# Patient Record
Sex: Male | Born: 1945
Health system: Southern US, Community
[De-identification: ages and names within clinical notes are randomized; demographics above are authoritative.]

## PROBLEM LIST (undated history)

## (undated) DIAGNOSIS — C439 Malignant melanoma of skin, unspecified: Secondary | ICD-10-CM

## (undated) DIAGNOSIS — M1990 Unspecified osteoarthritis, unspecified site: Secondary | ICD-10-CM

## (undated) DIAGNOSIS — T4145XA Adverse effect of unspecified anesthetic, initial encounter: Secondary | ICD-10-CM

## (undated) DIAGNOSIS — E785 Hyperlipidemia, unspecified: Secondary | ICD-10-CM

## (undated) DIAGNOSIS — H409 Unspecified glaucoma: Secondary | ICD-10-CM

## (undated) DIAGNOSIS — T8859XA Other complications of anesthesia, initial encounter: Secondary | ICD-10-CM

## (undated) DIAGNOSIS — I2510 Atherosclerotic heart disease of native coronary artery without angina pectoris: Secondary | ICD-10-CM

## (undated) DIAGNOSIS — C44311 Basal cell carcinoma of skin of nose: Secondary | ICD-10-CM

## (undated) DIAGNOSIS — Z9109 Other allergy status, other than to drugs and biological substances: Secondary | ICD-10-CM

## (undated) DIAGNOSIS — C61 Malignant neoplasm of prostate: Secondary | ICD-10-CM

## (undated) DIAGNOSIS — I255 Ischemic cardiomyopathy: Secondary | ICD-10-CM

## (undated) DIAGNOSIS — G4730 Sleep apnea, unspecified: Secondary | ICD-10-CM

## (undated) DIAGNOSIS — R0982 Postnasal drip: Secondary | ICD-10-CM

## (undated) DIAGNOSIS — C801 Malignant (primary) neoplasm, unspecified: Secondary | ICD-10-CM

## (undated) DIAGNOSIS — K219 Gastro-esophageal reflux disease without esophagitis: Secondary | ICD-10-CM

## (undated) DIAGNOSIS — E119 Type 2 diabetes mellitus without complications: Secondary | ICD-10-CM

## (undated) DIAGNOSIS — I10 Essential (primary) hypertension: Secondary | ICD-10-CM

## (undated) DIAGNOSIS — J349 Unspecified disorder of nose and nasal sinuses: Secondary | ICD-10-CM

## (undated) HISTORY — DX: Essential (primary) hypertension: I10

## (undated) HISTORY — DX: Ischemic cardiomyopathy: I25.5

## (undated) HISTORY — DX: Basal cell carcinoma of skin of nose: C44.311

## (undated) HISTORY — PX: KNEE SURGERY: SHX244

## (undated) HISTORY — DX: Malignant melanoma of skin, unspecified: C43.9

## (undated) HISTORY — DX: Gastro-esophageal reflux disease without esophagitis: K21.9

## (undated) HISTORY — PX: PROSTATE SURGERY: SHX751

## (undated) HISTORY — DX: Unspecified glaucoma: H40.9

## (undated) HISTORY — PX: HERNIA REPAIR: SHX51

## (undated) HISTORY — DX: Malignant neoplasm of prostate: C61

## (undated) HISTORY — DX: Type 2 diabetes mellitus without complications: E11.9

## (undated) HISTORY — DX: Unspecified disorder of nose and nasal sinuses: J34.9

## (undated) HISTORY — DX: Atherosclerotic heart disease of native coronary artery without angina pectoris: I25.10

---

## 1999-11-08 HISTORY — PX: JOINT REPLACEMENT: SHX530

## 2007-11-08 HISTORY — PX: PROSTATE SURGERY: SHX751

## 2008-01-01 ENCOUNTER — Encounter: Admission: RE | Admit: 2008-01-01 | Discharge: 2008-01-01 | Payer: Self-pay | Admitting: Orthopedic Surgery

## 2008-01-16 ENCOUNTER — Encounter: Admission: RE | Admit: 2008-01-16 | Discharge: 2008-01-16 | Payer: Self-pay | Admitting: Orthopedic Surgery

## 2008-04-30 ENCOUNTER — Ambulatory Visit (HOSPITAL_COMMUNITY): Admission: RE | Admit: 2008-04-30 | Discharge: 2008-04-30 | Payer: Self-pay | Admitting: Urology

## 2008-06-30 ENCOUNTER — Encounter (INDEPENDENT_AMBULATORY_CARE_PROVIDER_SITE_OTHER): Payer: Self-pay | Admitting: Urology

## 2008-06-30 ENCOUNTER — Inpatient Hospital Stay (HOSPITAL_COMMUNITY): Admission: RE | Admit: 2008-06-30 | Discharge: 2008-07-01 | Payer: Self-pay | Admitting: Urology

## 2008-08-28 ENCOUNTER — Ambulatory Visit (HOSPITAL_BASED_OUTPATIENT_CLINIC_OR_DEPARTMENT_OTHER): Admission: RE | Admit: 2008-08-28 | Discharge: 2008-08-28 | Payer: Self-pay | Admitting: Urology

## 2008-11-07 HISTORY — PX: HERNIA REPAIR: SHX51

## 2009-02-13 ENCOUNTER — Inpatient Hospital Stay (HOSPITAL_COMMUNITY): Admission: RE | Admit: 2009-02-13 | Discharge: 2009-02-17 | Payer: Self-pay | Admitting: Surgery

## 2009-10-22 ENCOUNTER — Ambulatory Visit: Admission: RE | Admit: 2009-10-22 | Discharge: 2009-10-22 | Payer: Self-pay | Admitting: Orthopedic Surgery

## 2009-11-03 ENCOUNTER — Inpatient Hospital Stay (HOSPITAL_COMMUNITY): Admission: RE | Admit: 2009-11-03 | Discharge: 2009-11-05 | Payer: Self-pay | Admitting: Orthopedic Surgery

## 2010-11-07 HISTORY — PX: JOINT REPLACEMENT: SHX530

## 2011-02-07 LAB — COMPREHENSIVE METABOLIC PANEL
Albumin: 4.2 g/dL (ref 3.5–5.2)
BUN: 18 mg/dL (ref 6–23)
CO2: 30 mEq/L (ref 19–32)
Chloride: 103 mEq/L (ref 96–112)
Creatinine, Ser: 1.16 mg/dL (ref 0.4–1.5)
GFR calc non Af Amer: >60 mL/min (ref >60)
Total Bilirubin: 1.1 mg/dL (ref 0.3–1.2)

## 2011-02-07 LAB — PROTIME-INR
INR: 1.05 (ref 0.00–1.49)
Prothrombin Time: 13.1 seconds (ref 11.6–15.2)
Prothrombin Time: 13.6 seconds (ref 11.6–15.2)

## 2011-02-07 LAB — URINALYSIS, ROUTINE W REFLEX MICROSCOPIC
Bilirubin Urine: NEGATIVE
Nitrite: NEGATIVE
Specific Gravity, Urine: 1.025 (ref 1.005–1.030)
Urobilinogen, UA: 1 mg/dL (ref 0.0–1.0)

## 2011-02-07 LAB — GLUCOSE, CAPILLARY
Glucose-Capillary: 132 mg/dL — ABNORMAL HIGH (ref 70–99)
Glucose-Capillary: 144 mg/dL — ABNORMAL HIGH (ref 70–99)
Glucose-Capillary: 146 mg/dL — ABNORMAL HIGH (ref 70–99)
Glucose-Capillary: 155 mg/dL — ABNORMAL HIGH (ref 70–99)
Glucose-Capillary: 188 mg/dL — ABNORMAL HIGH (ref 70–99)
Glucose-Capillary: 227 mg/dL — ABNORMAL HIGH (ref 70–99)
Glucose-Capillary: 71 mg/dL (ref 70–99)
Glucose-Capillary: 80 mg/dL (ref 70–99)

## 2011-02-07 LAB — DIFFERENTIAL
Basophils Absolute: 0 10*3/uL (ref 0.0–0.1)
Lymphocytes Relative: 23 % (ref 12–46)
Neutro Abs: 3.7 10*3/uL (ref 1.7–7.7)

## 2011-02-07 LAB — CBC
HCT: 46 % (ref 39.0–52.0)
MCHC: 33.1 g/dL (ref 30.0–36.0)
MCV: 91.5 fL (ref 78.0–100.0)
Platelets: 263 10*3/uL (ref 150–400)
WBC: 5.8 10*3/uL (ref 4.0–10.5)

## 2011-02-07 LAB — HEMOGLOBIN AND HEMATOCRIT, BLOOD
HCT: 42.6 % (ref 39.0–52.0)
Hemoglobin: 13.7 g/dL (ref 13.0–17.0)
Hemoglobin: 14.5 g/dL (ref 13.0–17.0)

## 2011-02-07 LAB — APTT: aPTT: 30 seconds (ref 24–37)

## 2011-02-07 LAB — TYPE AND SCREEN: ABO/RH(D): O POS

## 2011-02-07 LAB — URINE CULTURE: Special Requests: NEGATIVE

## 2011-02-16 LAB — GLUCOSE, CAPILLARY
Glucose-Capillary: 121 mg/dL — ABNORMAL HIGH (ref 70–99)
Glucose-Capillary: 145 mg/dL — ABNORMAL HIGH (ref 70–99)
Glucose-Capillary: 145 mg/dL — ABNORMAL HIGH (ref 70–99)
Glucose-Capillary: 149 mg/dL — ABNORMAL HIGH (ref 70–99)
Glucose-Capillary: 154 mg/dL — ABNORMAL HIGH (ref 70–99)
Glucose-Capillary: 155 mg/dL — ABNORMAL HIGH (ref 70–99)
Glucose-Capillary: 156 mg/dL — ABNORMAL HIGH (ref 70–99)
Glucose-Capillary: 160 mg/dL — ABNORMAL HIGH (ref 70–99)
Glucose-Capillary: 161 mg/dL — ABNORMAL HIGH (ref 70–99)
Glucose-Capillary: 168 mg/dL — ABNORMAL HIGH (ref 70–99)
Glucose-Capillary: 170 mg/dL — ABNORMAL HIGH (ref 70–99)
Glucose-Capillary: 176 mg/dL — ABNORMAL HIGH (ref 70–99)
Glucose-Capillary: 177 mg/dL — ABNORMAL HIGH (ref 70–99)
Glucose-Capillary: 199 mg/dL — ABNORMAL HIGH (ref 70–99)
Glucose-Capillary: 221 mg/dL — ABNORMAL HIGH (ref 70–99)

## 2011-02-17 LAB — DIFFERENTIAL
Basophils Absolute: 0 10*3/uL (ref 0.0–0.1)
Basophils Relative: 0 % (ref 0–1)
Lymphocytes Relative: 31 % (ref 12–46)
Monocytes Absolute: 0.4 10*3/uL (ref 0.1–1.0)
Monocytes Relative: 10 % (ref 3–12)
Neutro Abs: 2.1 10*3/uL (ref 1.7–7.7)
Neutrophils Relative %: 51 % (ref 43–77)

## 2011-02-17 LAB — COMPREHENSIVE METABOLIC PANEL
Albumin: 4 g/dL (ref 3.5–5.2)
Alkaline Phosphatase: 111 U/L (ref 39–117)
BUN: 14 mg/dL (ref 6–23)
Calcium: 9.5 mg/dL (ref 8.4–10.5)
Creatinine, Ser: 1.03 mg/dL (ref 0.4–1.5)
Glucose, Bld: 292 mg/dL — ABNORMAL HIGH (ref 70–99)
Total Protein: 6.8 g/dL (ref 6.0–8.3)

## 2011-02-17 LAB — CBC
HCT: 43.8 % (ref 39.0–52.0)
Hemoglobin: 14.7 g/dL (ref 13.0–17.0)
MCHC: 33.6 g/dL (ref 30.0–36.0)
MCV: 89.2 fL (ref 78.0–100.0)
Platelets: 267 10*3/uL (ref 150–400)
RDW: 14.6 % (ref 11.5–15.5)

## 2011-03-22 NOTE — Op Note (Signed)
NAME:  Justin Cohen, FAILS NO.:  [id number]   MEDICAL RECORD NO.:  [id number]          PATIENT TYPE:  AMB   LOCATION:  [facility]                         FACILITY:  [facility]   PHYSICIAN:  Heloise Purpura, MD      DATE OF BIRTH:  21-Sep-1946   DATE OF PROCEDURE:  08/28/2008  DATE OF DISCHARGE:                               OPERATIVE REPORT   PREOPERATIVE DIAGNOSIS:  Urethral foreign body.   POSTOPERATIVE DIAGNOSIS:  Urethral foreign body.   PROCEDURES:  1. Cystoscopy.  2. Dilation of urethral meatus.  3. Removal of staples from urethra.   SURGEON:  Heloise Purpura, MD.   ANESTHESIA:  General.   COMPLICATIONS:  None.   INDICATIONS FOR PROCEDURE:  [name] is a 65 year old gentleman who  has a history of prostate cancer and is status post treatment with a  robotic assisted laparoscopic radical prostatectomy.  He developed  voiding symptoms approximately 3-4 weeks ago including gross hematuria  and sprain of his urinary stream.  Flexible cystoscopy was performed in  the office and demonstrated a staple within the urethra at the  anastomosis that appeared to have migrated from the dorsal venous  complex.  It was recommended that he undergo removal of this staple.  There was also some question whether he may have an anastomotic  stricture that could require dilation.  He was therefore counseled  regarding the potential risks, complications and alternative options  associated with the above procedures and informed consent was obtained.   DESCRIPTION OF PROCEDURE:  The patient was taken to the operating room  and a general anesthetic was administered.  He was given preoperative  antibiotics, placed in the dorsal lithotomy position, and prepped and  draped in the usual sterile fashion.  Next, a preoperative time-out was  performed.  Cystourethroscopy was then performed with an attempt using  the 22-French cystoscope sheath.  However, the patient's urethral meatus  was noted to  be somewhat stenotic and required serial dilation with Sissy Hoff sounds.  Once the urethra was calibrated up 24-French, the 22-  French cystoscope sheath was able to be passed without difficulty.  The  urethra appeared normal up to the bladder neck where the patient's  healing anastomosis was identified.  Two small staples were noted in  approximately 11 o'clock.  The bladder was examined and appeared to be  normal without evidence for bladder tumors or other mucosal  abnormalities.  The ureteral orifices were in the normal position and  effluxing clear urine.  The flexible graspers were then used to remove  the urethral staples without difficulty.  Reinspection of the urethra  demonstrated no further staples or foreign body within the urethra.  The  patient's bladder was emptied and the procedure was ended.  He tolerated  this well and without complications.  He was able to be extubated and  transferred to the recovery unit in satisfactory condition.      Heloise Purpura, MD  Electronically Signed     LB/MEDQ  D:  08/28/2008  T:  08/28/2008  Job:  [id number]

## 2011-03-22 NOTE — Op Note (Signed)
NAME:  Justin Cohen, OHLIN NO.:  [id number]   MEDICAL RECORD NO.:  [id number]          PATIENT TYPE:  INP   LOCATION:  1533                         FACILITY:  [facility]   PHYSICIAN:  Sandria Bales. Ezzard Standing, M.D.  DATE OF BIRTH:  06/06/1946   DATE OF PROCEDURE:  02/13/2009  DATE OF DISCHARGE:                               OPERATIVE REPORT   PREOPERATIVE DIAGNOSIS:  Ventral incisional hernia, right abdomen.   POSTOPERATIVE DIAGNOSIS:  Right abdominal ventral incisional hernia,  approximately a 4 cm defect.  Incarcerated with small bowel.   PROCEDURE:  Laparoscopic ventral hernia repair with a 15 cm x 15 cm  Parietex mesh.   SURGEON:  Sandria Bales. Ezzard Standing, M.D.   FIRST ASSISTANT:  Nona Dell, PA student II.   ANESTHESIA:  General endotracheal with 20 mL of 1% Xylocaine.   COMPLICATIONS:  None.   INDICATIONS FOR PROCEDURE:  Justin Cohen is a 65 year old white male who  had a robotic prostatectomy by Dr. Heloise Purpura in August 2009.  He did  well from the surgery but developed a significant ileus postoperatively  with abdominal distention and then noticed a hernia in his right  abdominal wall.  I think this is a trocar site hernia.  I discussed with  him repairing this hernia.   The indications and potential complications were explained to the  patient.  The potential complications of hernia repair include, but are  not limited to, bleeding, infection, bowel injury and the possibility of  an open surgery.   DESCRIPTION OF PROCEDURE:  The patient is placed in a supine position  with his left arm tucked.  A Foley catheter was placed.  I did have to  dilate his urethra a little bit with a hemostat to get the foley to  pass.  PAS stockings were placed.  A time out was held to identify the  patient and the procedure.   I prepped the abdomen with Betadine solution and then surgically draped  the abdomen with an Ioban drape.  I accessed the abdominal cavity  through the left  upper quadrant with a 10 mm Ethicon Optiview trocar.  I  insufflated the abdomen.  He was noted to have some adhesions to his  midline with his attachment to his colon which I took down.  I placed  two additional 5 mm trocars  for abdominal access.  There was also a  band adhesion which I thought was a potential point of  obstruction.  I  divided the band with harmonic scalpel.  He does have a weakness of his  umbilicus.  Whether it is a true hernia or not, I am not sure, but it  was remote enough from his other defect, that I left it alone.   In his right abdomen, at the right mid-abdomen in the lateral wall, he  had a 4 cm defect with bowel incarcerated in the hernia.  I reduced the  small bowel.  I then closed the hernia defect with figure-of-eight #1  Novofil sutures.  Then I did an underly mesh of Parietex.  I used a 15  cm x 15 cm Parietex mesh.  I put eight retention sutures  in a clockwise  fashion of #0 Novofil around the edges, and then used 39 staples to  staple underneath the mesh and lay this down flat against the peritoneal  surface.   At the end of the procedure I then desufflated the abdomen.  I wanted to  make sure that no gaps around the edge of the mesh, and then re-  insufflated the abdomen.  The mesh lay flat.  It looked like the repair  covered the hernia well.  I then removed the trocar from the trocar  sites.  I then closed the trocar sites with #5-0 Vicryl suture and the  painted the wounds with Dermabond.   The patient tolerated the procedure well.  He had a Dermabond placed.  He was transferred to the recovery room in good condition.  Sponge and  needle count were correct at the end of the case.      Sandria Bales. Ezzard Standing, M.D.  Electronically Signed     DHN/MEDQ  D:  02/13/2009  T:  02/13/2009  Job:  [id number]   cc:   Tally Joe, M.D.  Fax: [phone]   Heloise Purpura, MD  Fax: [phone]

## 2011-03-22 NOTE — Op Note (Signed)
NAME:  NASIIR, MONTS NO.:  [id number]   MEDICAL RECORD NO.:  [id number]          PATIENT TYPE:  INP   LOCATION:  0003                         [facility]ACILITY:  [facility]   PHYSICIAN:  Heloise Purpura, MD      DATE OF BIRTH:  February 04, 1946   DATE OF PROCEDURE:  06/30/2008  DATE OF DISCHARGE:                               OPERATIVE REPORT   PREOPERATIVE DIAGNOSIS:  Clinically localized adenocarcinoma of prostate  (clinical stage T1C N0 M0).   POSTOPERATIVE DIAGNOSIS:  Clinically localized adenocarcinoma of  prostate (clinical stage T1C N0 M0).   PROCEDURES.:  1. Robotic assisted laparoscopic radical prostatectomy (non nerve      sparing).  2. Bilateral laparoscopic pelvic lymphadenectomy.   SURGEON:  Heloise Purpura, M.D.   ASSISTANTS:  Delman Kitten, M.D. and Delia Chimes, NP   ANESTHESIA:  General.   COMPLICATIONS:  None.   ESTIMATED BLOOD LOSS:  100 mL.   SPECIMENS:  1. Prostate and seminal vesicles.  2. Right pelvic lymph nodes.  3. Left pelvic lymph nodes.   DISPOSITION:  Specimens to pathology.   DRAINS:  1. 20-French Coude catheter.  2. #19 Blake pelvic drain.   INDICATION:  Mr. Prettyman is a 65 year old gentleman who was found to  have a high risk clinically localized adenocarcinoma of the prostate.  After undergoing a metastatic evaluation which was negative and  discussing management options for treatment, he elected to proceed with  surgical therapy and the above procedure.  Potential risks,  complications, alternative options were all discussed with the patient  and informed consent was obtained.   DESCRIPTION OF PROCEDURE:  The patient was taken to the operating room  and a general anesthetic was administered.  He was given preoperative  antibiotics, placed in the dorsal lithotomy position, and prepped and  draped in the usual sterile fashion.  Next a preoperative time-out was  performed.  A Foley catheter was inserted into the urethra and into  the  bladder.  A site was selected just to the left of the umbilicus for  placement of the camera port.  This was placed using a standard open  Hassan technique which allowed entry into the peritoneal cavity under  direct vision without difficulty.  A 12 mm port was then placed and the  pneumoperitoneum established.  The 0 degrees lens was used to inspect  the abdomen and there was no evidence of any intra-abdominal injuries or  other abnormalities.  The remaining ports were then placed with  bilateral 8 mm robotic ports placed 10 cm lateral to and just inferior  to the camera port site.  An additional 8 mm robotic port was placed in  the far left lateral abdominal wall.  A 5 mm port was placed between the  camera port and the right robotic port.  An additional 12 mm port was  placed in the far right lateral abdominal wall for laparoscopic  assistance.  All ports were placed under direct vision without  difficulty.  The surgical cart was then docked.  With the aid of the  cautery scissors, the bladder was reflected posteriorly  allowing entry  into the space of Retzius and identification of the endopelvic fascia  and prostate.  The endopelvic fascia was then incised from the apex back  to the base of prostate bilaterally and the underlying levator muscle  fibers were swept laterally off the prostate thereby isolating the  dorsal venous complex.  The dorsal venous complex was then stapled and  divided with a 45 mm flex ETS stapler.  The bladder neck was identified  with the aid of Foley catheter manipulation and was divided anteriorly  allowing entry into the bladder.  This exposed the Foley catheter and  the catheter balloon was deflated.  The catheter was then brought into  the operative field and used to retract the prostate anteriorly.  This  exposed the posterior bladder neck which was then incised and dissection  proceeded posteriorly between the bladder neck and prostate until the   vasa deferentia and seminal vesicles were identified.  The vasa  deferentia were isolated, divided and lifted anteriorly.  The seminal  vesicles were then dissected down to their tips with care to control  seminal vesicle arterial blood supply.  The space between Denonvillier's  fascia and the anterior rectum was then bluntly developed thereby  isolating the vascular pedicles of prostate.  The vascular pedicles of  the prostate were then ligated with Hem-o-lok clips in a wide non nerve  sparing fashion.  The pedicles were then divided with scissor dissection  and attention turned to the urethra which was sharply divided allowing  the prostate specimen to be disarticulated.  The pelvis was then  copiously irrigated and hemostasis was ensured.  There was no evidence  for a rectal injury.  Attention then turned to the right pelvic  sidewall.  The fibrofatty tissue between the external iliac vein,  confluence of the iliac vessels, hypogastric artery, and Cooper's  ligament was dissected free from the pelvic sidewall with care to  preserve the obturator nerve.  Hem-o-lok clips were used for hemostasis  and lymphostasis.  The specimen was passed off for permanent pathologic  analysis.  An identical procedure was then performed on the  contralateral side.  Attention then turned to the urethral anastomosis.  A double-armed 2-0 Vicryl suture was used to perform a slip-knot  reapproximation between the posterior bladder neck, Denonvillier's  fascia and the posterior urethra.  A double-armed 3-0 Monocryl suture  was then used perform a 360 degrees running tension-free anastomosis  between the bladder neck and urethra.  The 20-French Coude catheter was  inserted into the bladder and irrigated and there were no blood clots  within the bladder.  The anastomosis appeared to be watertight.  A #19  Blake drain was then brought through the left robotic port and  appropriately positioned in the pelvis.  It  was secured to skin with a  nylon suture.  The surgical cart was undocked and the remaining ports  were examined.  The right arm 8 mm robotic port was noted to have some  bleeding from the port site when the port was removed.  It was therefore  decided to control this with a figure-of-eight 0 Vicryl suture which was  placed with the aid of the Carter-Thomason needle.  The right lateral 12  mm port site was also closed with a 0 Vicryl suture in a similar  fashion.  All remaining ports were removed under direct vision and  without difficulty.  The prostate specimen was then removed intact  within the Endopouch  retrieval bag via the periumbilical port site.  This fascial opening was closed with a running 0 Vicryl suture.  All  port sites were injected with quarter percent Marcaine and  reapproximated at the skin level with staples.  Sterile dressings were  applied.  The patient appeared to tolerate the procedure well without  complications.  He was able to be extubated and transferred to recovery  unit in satisfactory condition.      Heloise Purpura, MD  Electronically Signed     LB/MEDQ  D:  06/30/2008  T:  06/30/2008  Job:  [id number]

## 2011-03-25 NOTE — Discharge Summary (Signed)
NAME:  Justin Cohen, Justin Cohen NO.:  [id number]   MEDICAL RECORD NO.:  [id number]          PATIENT TYPE:  INP   LOCATION:  1533                         FACILITY:  [facility]   PHYSICIAN:  Sandria Bales. Ezzard Standing, M.D.  DATE OF BIRTH:  1946/01/24   DATE OF ADMISSION:  02/13/2009  DATE OF DISCHARGE:  02/17/2009                               DISCHARGE SUMMARY   Date of admission - 13 February 2009  Date of discharge - ?   DISCHARGE DIAGNOSES:  1. Incarcerated right abdominal ventral incisional hernia.  2. Obesity.  3. Diabetes mellitus.  4. Hypertension.  5. Anxiety.  6. Hypercholesterolemia.  7. History of __________ .  8. History of prostate cancer status post prostatectomy.  9. Recent bronchitis treated with Biaxin, steroid Dose-Pak.   OPERATIONS PERFORMED:  The patient had a laparoscopic ventral hernia  repair by Justin Cohen on February 13, 2009.   HISTORY OF ILLNESS:  Justin Cohen is a 65 year old white male who is a  patient of Dr. Tally Cohen.  He underwent a robotic prostatectomy in  August 2009 by Justin Cohen.  He had problems post operatively with a  significant ileus and abdominal distension.  He did well from the  protate surgery, but noticed an increasing bulge in his right lower  quadrant.  On December 09, 2008 he underwent a CAT scan which showed  abdominal hernia in his right abdomen with a 3 cm abdominal wall defect  but a 10 cm sac.   He denied history of peptic ulcer disease, liver disease, colon disease  or pancreatic disease, and his only other abdominal surgery has been  this robotic-assisted prostatectomy.   REVIEW OF SYSTEMS:  Significant that:   1. He has history of hypertension.  2. He has a recent bronchitis treated at urgent care clinic with      antibiotics and a Dosepak.  3. He has done well from his prostatic surgery.  His PSAs have been      stable.  4. He had a total right knee in 2001 for which he has done well.  5. He is diabetic on oral  hypoglycemics.  6. He has history of anxiety.  7. Hypercholesterolemia.   HOSPITAL COURSE:  On the day of admission the patient was taken to the  operating room where he underwent a laparoscopic ventral hernia repair  with a piece of Parietex mesh.  The hernia was incarcerated with a loop  of small bowel stuck in the hernia.   Postoperatively he did well.  The biggest problem was getting his bowel  function returned, but by the third day he was slowly getting better,  increasing his diet.  His diabetes was stable with a blood glucose of  199.  His sinuses which bothered him earlier after surgery were getting  better.   By the first postop day he was taking p.o.'s.  His sinuses seemed  better.  I did check his hemoglobin A1c which was 8.0.  He passed a large amount of gas on April 13 and was ready for discharge.   DISCHARGE INSTRUCTIONS:  1.  A diabetic diet.  2. He could shower when he could got home.  3. He was do no lifting for 4 weeks.  4. He was given Vicodin for pain.  5. He was to resume his home medications as outlined on the medication      reconciliation sheet.  [I could not find the copy of the medication      reconcilliation sheet in the chart.]  6. He is also to wear an abdominal binder for at least 1 month after      surgery.   DISCHARGE CONDITION:  Good.      Sandria Bales. Ezzard Standing, M.D.  Electronically Signed     DHN/MEDQ  D:  03/16/2009  T:  03/16/2009  Job:  [id number]   cc:   Justin Cohen, M.D.  Fax: [phone]   Justin Purpura, MD  Fax: [phone]

## 2011-08-08 LAB — GLUCOSE, CAPILLARY: Glucose-Capillary: 118 — ABNORMAL HIGH

## 2011-08-08 LAB — POCT I-STAT 4, (NA,K, GLUC, HGB,HCT)
Glucose, Bld: 117 — ABNORMAL HIGH
HCT: 43
Potassium: 4.4

## 2012-11-07 HISTORY — PX: BUNIONECTOMY: SHX129

## 2013-05-30 ENCOUNTER — Ambulatory Visit
Admission: RE | Admit: 2013-05-30 | Discharge: 2013-05-30 | Disposition: A | Discharge status: Home / Self Care | Payer: Medicare Other | Source: Ambulatory Visit | Attending: Family Medicine | Admitting: Family Medicine

## 2013-05-30 ENCOUNTER — Other Ambulatory Visit: Payer: Self-pay | Admitting: Family Medicine

## 2013-05-30 DIAGNOSIS — M25512 Pain in left shoulder: Secondary | ICD-10-CM

## 2013-06-06 ENCOUNTER — Other Ambulatory Visit: Payer: Self-pay | Admitting: Gastroenterology

## 2013-08-12 ENCOUNTER — Ambulatory Visit: Payer: Medicare Other

## 2013-09-24 ENCOUNTER — Other Ambulatory Visit: Payer: Self-pay | Admitting: Family Medicine

## 2013-09-24 DIAGNOSIS — Z139 Encounter for screening, unspecified: Secondary | ICD-10-CM

## 2013-09-30 ENCOUNTER — Ambulatory Visit
Admission: RE | Admit: 2013-09-30 | Discharge: 2013-09-30 | Disposition: A | Discharge status: Home / Self Care | Payer: Medicare Other | Source: Ambulatory Visit | Attending: Family Medicine | Admitting: Family Medicine

## 2013-09-30 DIAGNOSIS — Z139 Encounter for screening, unspecified: Secondary | ICD-10-CM

## 2013-10-09 ENCOUNTER — Ambulatory Visit (INDEPENDENT_AMBULATORY_CARE_PROVIDER_SITE_OTHER): Payer: Medicare Other

## 2013-10-09 VITALS — BP 151/81 | HR 75 | Resp 18

## 2013-10-09 DIAGNOSIS — E1140 Type 2 diabetes mellitus with diabetic neuropathy, unspecified: Secondary | ICD-10-CM

## 2013-10-09 DIAGNOSIS — M2010 Hallux valgus (acquired), unspecified foot: Secondary | ICD-10-CM

## 2013-10-09 DIAGNOSIS — Q828 Other specified congenital malformations of skin: Secondary | ICD-10-CM

## 2013-10-09 DIAGNOSIS — E1149 Type 2 diabetes mellitus with other diabetic neurological complication: Secondary | ICD-10-CM

## 2013-10-09 DIAGNOSIS — M2040 Other hammer toe(s) (acquired), unspecified foot: Secondary | ICD-10-CM

## 2013-10-09 NOTE — Patient Instructions (Signed)
Diabetes and Foot Care Diabetes may cause you to have problems because of poor blood supply (circulation) to your feet and legs. This may cause the skin on your feet to become thinner, break easier, and heal more slowly. Your skin may become dry, and the skin may peel and crack. You may also have nerve damage in your legs and feet causing decreased feeling in them. You may not notice minor injuries to your feet that could lead to infections or more serious problems. Taking care of your feet is one of the most important things you can do for yourself.  HOME CARE INSTRUCTIONS  Wear shoes at all times, even in the house. Do not go barefoot. Bare feet are easily injured.  Check your feet daily for blisters, cuts, and redness. If you cannot see the bottom of your feet, use a mirror or ask someone for help.  Wash your feet with warm water (do not use hot water) and mild soap. Then pat your feet and the areas between your toes until they are completely dry. Do not soak your feet as this can dry your skin.  Apply a moisturizing lotion or petroleum jelly (that does not contain alcohol and is unscented) to the skin on your feet and to dry, brittle toenails. Do not apply lotion between your toes.  Trim your toenails straight across. Do not dig under them or around the cuticle. File the edges of your nails with an emery board or nail file.  Do not cut corns or calluses or try to remove them with medicine.  Wear clean socks or stockings every day. Make sure they are not too tight. Do not wear knee-high stockings since they may decrease blood flow to your legs.  Wear shoes that fit properly and have enough cushioning. To break in new shoes, wear them for just a few hours a day. This prevents you from injuring your feet. Always look in your shoes before you put them on to be sure there are no objects inside.  Do not cross your legs. This may decrease the blood flow to your feet.  If you find a minor scrape,  cut, or break in the skin on your feet, keep it and the skin around it clean and dry. These areas may be cleansed with mild soap and water. Do not cleanse the area with peroxide, alcohol, or iodine.  When you remove an adhesive bandage, be sure not to damage the skin around it.  If you have a wound, look at it several times a day to make sure it is healing.  Do not use heating pads or hot water bottles. They may burn your skin. If you have lost feeling in your feet or legs, you may not know it is happening until it is too late.  Make sure your health care provider performs a complete foot exam at least annually or more often if you have foot problems. Report any cuts, sores, or bruises to your health care provider immediately. SEEK MEDICAL CARE IF:   You have an injury that is not healing.  You have cuts or breaks in the skin.  You have an ingrown nail.  You notice redness on your legs or feet.  You feel burning or tingling in your legs or feet.  You have pain or cramps in your legs and feet.  Your legs or feet are numb.  Your feet always feel cold. SEEK IMMEDIATE MEDICAL CARE IF:   There is increasing redness,   swelling, or pain in or around a wound.  There is a red line that goes up your leg.  Pus is coming from a wound.  You develop a fever or as directed by your health care provider.  You notice a bad smell coming from an ulcer or wound. Document Released: 10/21/2000 Document Revised: 06/26/2013 Document Reviewed: 04/02/2013 [employer] Patient Information 2014 [employer].  

## 2013-10-09 NOTE — Progress Notes (Signed)
   Subjective:    Patient ID: Justin Cohen, male    DOB: April 13, 1946, 67 y.o.   MRN: [id number]  HPI here to get my shoes    Review of Systems  Constitutional: Negative.   HENT: Negative.   Eyes: Negative.   Respiratory: Negative.   Cardiovascular: Negative.   Gastrointestinal: Negative.   Endocrine: Negative.   Genitourinary: Negative.   Musculoskeletal: Positive for back pain.  Skin: Negative.   Allergic/Immunologic: Negative.   Neurological: Negative.   Hematological: Negative.   Psychiatric/Behavioral: Negative.        Objective:   Physical Exam Neurovascular status intact and unchanged pedal pulses palpable DP postal for PT one over 4 bilateral. 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 At this time dispensed 1 pair of extra-depth shoes and 3 pairs of dual density Plastizote inlays in lace fit and contour well full contact the patient's foot and arch. Oral and written instructions for shoe and orthotic use are dispensed with break in instructions. Patient is no active ulcerations or wounds of current time. His diabetes is being well managed continue followup suggested a 1-2 month followup for possible palliative care in shoe assessment.     Assessment & Plan:  Assessment diabetes with peripheral neuropathy. Digital deformities and bunion deformity with associated keratoses. Dispensed extra-depth shoes and custom insoles to accommodate deformities and allow for appropriate ambulation patient replacing a pair more shoes that have been successful in managing his diabetic foot issues. Recheck in one to 2 months for followup and foot check and shoe adjustments if needed.  Alvan Dame DPM

## 2013-10-22 ENCOUNTER — Ambulatory Visit: Payer: Medicare Other | Admitting: Dietician

## 2013-12-11 ENCOUNTER — Ambulatory Visit (INDEPENDENT_AMBULATORY_CARE_PROVIDER_SITE_OTHER): Payer: Medicare Other

## 2013-12-11 VITALS — BP 159/87 | HR 55 | Resp 18

## 2013-12-11 DIAGNOSIS — E1140 Type 2 diabetes mellitus with diabetic neuropathy, unspecified: Secondary | ICD-10-CM

## 2013-12-11 DIAGNOSIS — B351 Tinea unguium: Secondary | ICD-10-CM

## 2013-12-11 DIAGNOSIS — E1142 Type 2 diabetes mellitus with diabetic polyneuropathy: Secondary | ICD-10-CM

## 2013-12-11 DIAGNOSIS — M79609 Pain in unspecified limb: Secondary | ICD-10-CM

## 2013-12-11 DIAGNOSIS — E1149 Type 2 diabetes mellitus with other diabetic neurological complication: Secondary | ICD-10-CM

## 2013-12-11 DIAGNOSIS — Q828 Other specified congenital malformations of skin: Secondary | ICD-10-CM

## 2013-12-11 NOTE — Patient Instructions (Signed)
Diabetes and Foot Care Diabetes may cause you to have problems because of poor blood supply (circulation) to your feet and legs. This may cause the skin on your feet to become thinner, break easier, and heal more slowly. Your skin may become dry, and the skin may peel and crack. You may also have nerve damage in your legs and feet causing decreased feeling in them. You may not notice minor injuries to your feet that could lead to infections or more serious problems. Taking care of your feet is one of the most important things you can do for yourself.  HOME CARE INSTRUCTIONS  Wear shoes at all times, even in the house. Do not go barefoot. Bare feet are easily injured.  Check your feet daily for blisters, cuts, and redness. If you cannot see the bottom of your feet, use a mirror or ask someone for help.  Wash your feet with warm water (do not use hot water) and mild soap. Then pat your feet and the areas between your toes until they are completely dry. Do not soak your feet as this can dry your skin.  Apply a moisturizing lotion or petroleum jelly (that does not contain alcohol and is unscented) to the skin on your feet and to dry, brittle toenails. Do not apply lotion between your toes.  Trim your toenails straight across. Do not dig under them or around the cuticle. File the edges of your nails with an emery board or nail file.  Do not cut corns or calluses or try to remove them with medicine.  Wear clean socks or stockings every day. Make sure they are not too tight. Do not wear knee-high stockings since they may decrease blood flow to your legs.  Wear shoes that fit properly and have enough cushioning. To break in new shoes, wear them for just a few hours a day. This prevents you from injuring your feet. Always look in your shoes before you put them on to be sure there are no objects inside.  Do not cross your legs. This may decrease the blood flow to your feet.  If you find a minor scrape,  cut, or break in the skin on your feet, keep it and the skin around it clean and dry. These areas may be cleansed with mild soap and water. Do not cleanse the area with peroxide, alcohol, or iodine.  When you remove an adhesive bandage, be sure not to damage the skin around it.  If you have a wound, look at it several times a day to make sure it is healing.  Do not use heating pads or hot water bottles. They may burn your skin. If you have lost feeling in your feet or legs, you may not know it is happening until it is too late.  Make sure your health care provider performs a complete foot exam at least annually or more often if you have foot problems. Report any cuts, sores, or bruises to your health care provider immediately. SEEK MEDICAL CARE IF:   You have an injury that is not healing.  You have cuts or breaks in the skin.  You have an ingrown nail.  You notice redness on your legs or feet.  You feel burning or tingling in your legs or feet.  You have pain or cramps in your legs and feet.  Your legs or feet are numb.  Your feet always feel cold. SEEK IMMEDIATE MEDICAL CARE IF:   There is increasing redness,   swelling, or pain in or around a wound.  There is a red line that goes up your leg.  Pus is coming from a wound.  You develop a fever or as directed by your health care provider.  You notice a bad smell coming from an ulcer or wound. Document Released: 10/21/2000 Document Revised: 06/26/2013 Document Reviewed: 04/02/2013 [employer] Patient Information 2014 [employer].  

## 2013-12-11 NOTE — Progress Notes (Signed)
° °  Subjective:    Patient ID: Justin Cohen, male    DOB: January 05, 1946, 68 y.o.   MRN: [id number]  HPI trim my nails and calluses on both feet and I wander If my shoes are too wide due to I have to trim up my calluses every 2 weeks or so    Review of Systems no new changes or findings noted at this time.     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 Keratoses first MTP and IP joint left more so than right. There is also thick brittle crumbly criptotic incurvated nails 1 through 5 bilateral which are debrided at this time. No open wounds ulcerations patient does have some decreased epicritic sensation of distal tuft of the toes otherwise intact sensations noted. Normal muscle strengths and range of motion is noted.       Assessment & Plan:  Assessment diabetes with history peripheral neuropathy and mild complications patient is a keratoses first MTP area bilateral manage with his diabetic shoes and Plastizote insert insoles inlays. Also thick criptotic incurvated mycotic nails 1 through 5 bilateral debrided at this time return suggest 3 month followup for continued palliative care in the future. Next  Harriet Masson DPM

## 2014-03-12 ENCOUNTER — Ambulatory Visit: Payer: Medicare Other

## 2014-10-07 HISTORY — PX: OTHER SURGICAL HISTORY: SHX169

## 2014-12-03 DIAGNOSIS — Z08 Encounter for follow-up examination after completed treatment for malignant neoplasm: Secondary | ICD-10-CM | POA: Diagnosis not present

## 2014-12-03 DIAGNOSIS — Z85828 Personal history of other malignant neoplasm of skin: Secondary | ICD-10-CM | POA: Diagnosis not present

## 2014-12-03 DIAGNOSIS — L570 Actinic keratosis: Secondary | ICD-10-CM | POA: Diagnosis not present

## 2015-01-09 DIAGNOSIS — M1712 Unilateral primary osteoarthritis, left knee: Secondary | ICD-10-CM | POA: Diagnosis not present

## 2015-01-15 DIAGNOSIS — H5210 Myopia, unspecified eye: Secondary | ICD-10-CM | POA: Diagnosis not present

## 2015-01-15 DIAGNOSIS — H5203 Hypermetropia, bilateral: Secondary | ICD-10-CM | POA: Diagnosis not present

## 2015-01-21 DIAGNOSIS — M1712 Unilateral primary osteoarthritis, left knee: Secondary | ICD-10-CM | POA: Diagnosis not present

## 2015-01-26 DIAGNOSIS — E782 Mixed hyperlipidemia: Secondary | ICD-10-CM | POA: Diagnosis not present

## 2015-01-26 DIAGNOSIS — M179 Osteoarthritis of knee, unspecified: Secondary | ICD-10-CM | POA: Diagnosis not present

## 2015-01-26 DIAGNOSIS — I10 Essential (primary) hypertension: Secondary | ICD-10-CM | POA: Diagnosis not present

## 2015-01-26 DIAGNOSIS — F419 Anxiety disorder, unspecified: Secondary | ICD-10-CM | POA: Diagnosis not present

## 2015-01-26 DIAGNOSIS — J309 Allergic rhinitis, unspecified: Secondary | ICD-10-CM | POA: Diagnosis not present

## 2015-01-26 DIAGNOSIS — Z0000 Encounter for general adult medical examination without abnormal findings: Secondary | ICD-10-CM | POA: Diagnosis not present

## 2015-01-26 DIAGNOSIS — C61 Malignant neoplasm of prostate: Secondary | ICD-10-CM | POA: Diagnosis not present

## 2015-01-26 DIAGNOSIS — E119 Type 2 diabetes mellitus without complications: Secondary | ICD-10-CM | POA: Diagnosis not present

## 2015-02-09 ENCOUNTER — Encounter (HOSPITAL_COMMUNITY): Payer: Self-pay

## 2015-02-10 NOTE — Patient Instructions (Addendum)
Justin Cohen  02/10/2015   Your procedure is scheduled on: 02/17/2015    Report to [facility] Main  Entrance and follow signs to               [place] at      1000 AM.  Call this number if you have problems the morning of surgery [phone]   Remember: Eat a good healthy snack prior to bedtime.    Do not eat food or drink liquids :After Midnight.     Take these medicines the morning of surgery with A SIP OF WATER: zyrtec, cosopt eye drops, Flonase nasal spray, Prevacid, Effexor                                 You may not have any metal on your body including hair pins and              piercings  Do not wear jewelry, , lotions, powders or perfumes., deodorant.                         Men may shave face and neck.   Do not bring valuables to the hospital. [place].  Contacts, dentures or bridgework may not be worn into surgery.  Leave suitcase in the car. After surgery it may be brought to your room.        Special Instructions: coughing and deep breathing exercises, leg exercises               Please read over the following fact sheets you were given: _____________________________________________________________________             [facility] - Preparing for Surgery Before surgery, you can play an important role.  Because skin is not sterile, your skin needs to be as free of germs as possible.  You can reduce the number of germs on your skin by washing with CHG (chlorahexidine gluconate) soap before surgery.  CHG is an antiseptic cleaner which kills germs and bonds with the skin to continue killing germs even after washing. Please DO NOT use if you have an allergy to CHG or antibacterial soaps.  If your skin becomes reddened/irritated stop using the CHG and inform your nurse when you arrive at Short Stay. Do not shave (including legs and underarms) for at least 48 hours prior to the first CHG  shower.  You may shave your face/neck. Please follow these instructions carefully:  1.  Shower with CHG Soap the night before surgery and the  morning of Surgery.  2.  If you choose to wash your hair, wash your hair first as usual with your  normal  shampoo.  3.  After you shampoo, rinse your hair and body thoroughly to remove the  shampoo.                           4.  Use CHG as you would any other liquid soap.  You can apply chg directly  to the skin and wash                       Gently with a  scrungie or clean washcloth.  5.  Apply the CHG Soap to your body ONLY FROM THE NECK DOWN.   Do not use on face/ open                           Wound or open sores. Avoid contact with eyes, ears mouth and genitals (private parts).                       Wash face,  Genitals (private parts) with your normal soap.             6.  Wash thoroughly, paying special attention to the area where your surgery  will be performed.  7.  Thoroughly rinse your body with warm water from the neck down.  8.  DO NOT shower/wash with your normal soap after using and rinsing off  the CHG Soap.                9.  Pat yourself dry with a clean towel.            10.  Wear clean pajamas.            11.  Place clean sheets on your bed the night of your first shower and do not  sleep with pets. Day of Surgery : Do not apply any lotions/deodorants the morning of surgery.  Please wear clean clothes to the hospital/surgery center.  FAILURE TO FOLLOW THESE INSTRUCTIONS MAY RESULT IN THE CANCELLATION OF YOUR SURGERY PATIENT SIGNATURE_________________________________  NURSE SIGNATURE__________________________________  ________________________________________________________________________  WHAT IS A BLOOD TRANSFUSION? Blood Transfusion Information  A transfusion is the replacement of blood or some of its parts. Blood is made up of multiple cells which provide different functions.  Red blood cells carry oxygen and are used for  blood loss replacement.  White blood cells fight against infection.  Platelets control bleeding.  Plasma helps clot blood.  Other blood products are available for specialized needs, such as hemophilia or other clotting disorders. BEFORE THE TRANSFUSION  Who gives blood for transfusions?   Healthy volunteers who are fully evaluated to make sure their blood is safe. This is blood bank blood. Transfusion therapy is the safest it has ever been in the practice of medicine. Before blood is taken from a donor, a complete history is taken to make sure that person has no history of diseases nor engages in risky social behavior (examples are intravenous drug use or sexual activity with multiple partners). The donor's travel history is screened to minimize risk of transmitting infections, such as malaria. The donated blood is tested for signs of infectious diseases, such as HIV and hepatitis. The blood is then tested to be sure it is compatible with you in order to minimize the chance of a transfusion reaction. If you or a relative donates blood, this is often done in anticipation of surgery and is not appropriate for emergency situations. It takes many days to process the donated blood. RISKS AND COMPLICATIONS Although transfusion therapy is very safe and saves many lives, the main dangers of transfusion include:  1. Getting an infectious disease. 2. Developing a transfusion reaction. This is an allergic reaction to something in the blood you were given. Every precaution is taken to prevent this. The decision to have a blood transfusion has been considered carefully by your caregiver before blood is given. Blood is not given unless the benefits outweigh the risks. AFTER  THE TRANSFUSION  Right after receiving a blood transfusion, you will usually feel much better and more energetic. This is especially true if your red blood cells have gotten low (anemic). The transfusion raises the level of the red blood  cells which carry oxygen, and this usually causes an energy increase.  The nurse administering the transfusion will monitor you carefully for complications. HOME CARE INSTRUCTIONS  No special instructions are needed after a transfusion. You may find your energy is better. Speak with your caregiver about any limitations on activity for underlying diseases you may have. SEEK MEDICAL CARE IF:   Your condition is not improving after your transfusion.  You develop redness or irritation at the intravenous (IV) site. SEEK IMMEDIATE MEDICAL CARE IF:  Any of the following symptoms occur over the next 12 hours:  Shaking chills.  You have a temperature by mouth above 102 F (38.9 C), not controlled by medicine.  Chest, back, or muscle pain.  People around you feel you are not acting correctly or are confused.  Shortness of breath or difficulty breathing.  Dizziness and fainting.  You get a rash or develop hives.  You have a decrease in urine output.  Your urine turns a dark color or changes to pink, red, or brown. Any of the following symptoms occur over the next 10 days:  You have a temperature by mouth above 102 F (38.9 C), not controlled by medicine.  Shortness of breath.  Weakness after normal activity.  The white part of the eye turns yellow (jaundice).  You have a decrease in the amount of urine or are urinating less often.  Your urine turns a dark color or changes to pink, red, or brown. Document Released: 10/21/2000 Document Revised: 01/16/2012 Document Reviewed: 06/09/2008 ExitCare Patient Information 2014 [place].  _______________________________________________________________________  Incentive Spirometer  An incentive spirometer is a tool that can help keep your lungs clear and active. This tool measures how well you are filling your lungs with each breath. Taking long deep breaths may help reverse or decrease the chance of developing breathing  (pulmonary) problems (especially infection) following:  A long period of time when you are unable to move or be active. BEFORE THE PROCEDURE   If the spirometer includes an indicator to show your best effort, your nurse or respiratory therapist will set it to a desired goal.  If possible, sit up straight or lean slightly forward. Try not to slouch.  Hold the incentive spirometer in an upright position. INSTRUCTIONS FOR USE  3. Sit on the edge of your bed if possible, or sit up as far as you can in bed or on a chair. 4. Hold the incentive spirometer in an upright position. 5. Breathe out normally. 6. Place the mouthpiece in your mouth and seal your lips tightly around it. 7. Breathe in slowly and as deeply as possible, raising the piston or the ball toward the top of the column. 8. Hold your breath for 3-5 seconds or for as long as possible. Allow the piston or ball to fall to the bottom of the column. 9. Remove the mouthpiece from your mouth and breathe out normally. 10. Rest for a few seconds and repeat Steps 1 through 7 at least 10 times every 1-2 hours when you are awake. Take your time and take a few normal breaths between deep breaths. 11. The spirometer may include an indicator to show your best effort. Use the indicator as a goal to work toward during each  repetition. 12. After each set of 10 deep breaths, practice coughing to be sure your lungs are clear. If you have an incision (the cut made at the time of surgery), support your incision when coughing by placing a pillow or rolled up towels firmly against it. Once you are able to get out of bed, walk around indoors and cough well. You may stop using the incentive spirometer when instructed by your caregiver.  RISKS AND COMPLICATIONS  Take your time so you do not get dizzy or light-headed.  If you are in pain, you may need to take or ask for pain medication before doing incentive spirometry. It is harder to take a deep breath if you  are having pain. AFTER USE  Rest and breathe slowly and easily.  It can be helpful to keep track of a log of your progress. Your caregiver can provide you with a simple table to help with this. If you are using the spirometer at home, follow these instructions: [place] IF:   You are having difficultly using the spirometer.  You have trouble using the spirometer as often as instructed.  Your pain medication is not giving enough relief while using the spirometer.  You develop fever of 100.5 F (38.1 C) or higher. SEEK IMMEDIATE MEDICAL CARE IF:   You cough up bloody sputum that had not been present before.  You develop fever of 102 F (38.9 C) or greater.  You develop worsening pain at or near the incision site. MAKE SURE YOU:   Understand these instructions.  Will watch your condition.  Will get help right away if you are not doing well or get worse. Document Released: 03/06/2007 Document Revised: 01/16/2012 Document Reviewed: 05/07/2007 [facility] Patient Information 2014 [place], [place].   ________________________________________________________________________

## 2015-02-11 ENCOUNTER — Encounter (HOSPITAL_COMMUNITY): Payer: Self-pay

## 2015-02-11 ENCOUNTER — Encounter (HOSPITAL_COMMUNITY)
Admission: RE | Admit: 2015-02-11 | Discharge: 2015-02-11 | Disposition: A | Discharge status: Home / Self Care | Payer: Commercial Managed Care - HMO | Source: Ambulatory Visit | Attending: Orthopedic Surgery | Admitting: Orthopedic Surgery

## 2015-02-11 DIAGNOSIS — Z01818 Encounter for other preprocedural examination: Secondary | ICD-10-CM | POA: Diagnosis not present

## 2015-02-11 HISTORY — DX: Malignant (primary) neoplasm, unspecified: C80.1

## 2015-02-11 HISTORY — DX: Sleep apnea, unspecified: G47.30

## 2015-02-11 HISTORY — DX: Unspecified osteoarthritis, unspecified site: M19.90

## 2015-02-11 LAB — APTT: aPTT: 33 seconds (ref 24–37)

## 2015-02-11 LAB — URINALYSIS, ROUTINE W REFLEX MICROSCOPIC
Bilirubin Urine: NEGATIVE
Glucose, UA: NEGATIVE mg/dL
HGB URINE DIPSTICK: NEGATIVE
Ketones, ur: NEGATIVE mg/dL
Leukocytes, UA: NEGATIVE
Nitrite: POSITIVE — AB
PROTEIN: NEGATIVE mg/dL
Specific Gravity, Urine: 1.013 (ref 1.005–1.030)
Urobilinogen, UA: 1 mg/dL (ref 0.0–1.0)
pH: 7.5 (ref 5.0–8.0)

## 2015-02-11 LAB — URINE MICROSCOPIC-ADD ON

## 2015-02-11 LAB — BASIC METABOLIC PANEL
Anion gap: 8 (ref 5–15)
BUN: 15 mg/dL (ref 6–23)
CO2: 27 mmol/L (ref 19–32)
CREATININE: 0.87 mg/dL (ref 0.50–1.35)
Calcium: 9.6 mg/dL (ref 8.4–10.5)
Chloride: 104 mmol/L (ref 96–112)
GFR, EST NON AFRICAN AMERICAN: 85 mL/min — AB (ref >90)
Glucose, Bld: 76 mg/dL (ref 70–99)
Potassium: 4.4 mmol/L (ref 3.5–5.1)
Sodium: 139 mmol/L (ref 135–145)

## 2015-02-11 LAB — CBC
HCT: 46.2 % (ref 39.0–52.0)
Hemoglobin: 15.1 g/dL (ref 13.0–17.0)
MCH: 29.3 pg (ref 26.0–34.0)
MCHC: 32.7 g/dL (ref 30.0–36.0)
MCV: 89.7 fL (ref 78.0–100.0)
Platelets: 291 10*3/uL (ref 150–400)
RBC: 5.15 MIL/uL (ref 4.22–5.81)
RDW: 14.6 % (ref 11.5–15.5)
WBC: 5.1 10*3/uL (ref 4.0–10.5)

## 2015-02-11 LAB — PROTIME-INR
INR: 1.04 (ref 0.00–1.49)
Prothrombin Time: 13.7 seconds (ref 11.6–15.2)

## 2015-02-11 LAB — SURGICAL PCR SCREEN
MRSA, PCR: NEGATIVE
Staphylococcus aureus: NEGATIVE

## 2015-02-11 NOTE — Progress Notes (Signed)
Clearance Dr Moreen Fowler dated 01/26/15 on chart

## 2015-02-11 NOTE — Progress Notes (Signed)
U/A with micro results done 02/11/2015 faxed via EPIC to Dr Alvan Dame.

## 2015-02-12 MED ORDER — AMLODIPINE BESYLATE 5 MG PO TABS: 5.0000 mg | ORAL_TABLET | Freq: Every day | ORAL | Status: DC

## 2015-02-15 NOTE — H&P (Signed)
TOTAL KNEE ADMISSION H&P  Patient is being admitted for left total knee arthroplasty.  Subjective:  Chief Complaint:  Left knee primary OA / pain.  HPI: Justin Cohen, 69 y.o. male, has a history of pain and functional disability in the left knee due to arthritis and has failed non-surgical conservative treatments for greater than 12 weeks to includeNSAID's and/or analgesics, corticosteriod injections and activity modification.  Onset of symptoms was gradual, starting ~! years ago with gradually worsening course since that time. The patient noted prior procedures on the knee to include  arthroplasty on the right knee in 2001.  Patient currently rates pain in the left knee(s) at 8 out of 10 with activity. Patient has night pain, worsening of pain with activity and weight bearing, pain that interferes with activities of daily living, pain with passive range of motion, crepitus and joint swelling.  Patient has evidence of periarticular osteophytes and joint space narrowing by imaging studies.  There is no active infection.  Risks, benefits and expectations were discussed with the patient.  Risks including but not limited to the risk of anesthesia, blood clots, nerve damage, blood vessel damage, failure of the prosthesis, infection and up to and including death.  Patient understand the risks, benefits and expectations and wishes to proceed with surgery.   PCP: Gara Kroner, MD  D/C Plans:      Home with HHPT  Post-op Meds:       No Rx given   Tranexamic Acid:      To be given - IV  Decadron:      Is to be given  FYI:     ASA post-op  Norco post-op  Tendency toward opoid constipation  CPAP    Past Medical History  Diagnosis Date  . Diabetes mellitus without complication   . Glaucoma   . Hypertension   . Sinus problem   . Sleep apnea     cpap- setting at 10   . Arthritis   . Cancer     prostate cancer , basal cell skin cancer removed from nose 10/2014     Past Surgical History   Procedure Laterality Date  . Hernia repair    . Prostate surgery    . Knee surgery      X 2    No prescriptions prior to admission   No Known Allergies   History  Substance Use Topics  . Smoking status: Former Research scientist (life sciences)  . Smokeless tobacco: Never Used  . Alcohol Use: Yes     Comment: occasional glass of wine        Review of Systems  Constitutional: Negative.   HENT: Negative.   Eyes: Negative.   Respiratory: Negative.   Cardiovascular: Negative.   Gastrointestinal: Negative.   Genitourinary: Negative.   Musculoskeletal: Positive for joint pain.  Skin: Negative.   Neurological: Negative.   Endo/Heme/Allergies: Negative.   Psychiatric/Behavioral: Negative.     Objective:  Physical Exam  Constitutional: He is oriented to person, place, and time. He appears well-developed and well-nourished.  HENT:  Head: Normocephalic.  Eyes: Pupils are equal, round, and reactive to light.  Neck: Neck supple. No JVD present. No tracheal deviation present. No thyromegaly present.  Cardiovascular: Normal rate, regular rhythm, normal heart sounds and intact distal pulses.   Respiratory: Effort normal and breath sounds normal. No stridor. No respiratory distress. He has no wheezes.  GI: Soft. There is no tenderness. There is no guarding.  Musculoskeletal:  Left knee: He exhibits decreased range of motion, swelling and bony tenderness. He exhibits no ecchymosis, no deformity, no laceration and no erythema. Tenderness found.  Lymphadenopathy:    He has no cervical adenopathy.  Neurological: He is alert and oriented to person, place, and time.  Skin: Skin is warm and dry.  Psychiatric: He has a normal mood and affect.       Imaging Review Plain radiographs demonstrate severe degenerative joint disease of the left knee(s). The overall alignment is  neutral. The bone quality appears to be good for age and reported activity level.  Assessment/Plan:  End stage arthritis, left  knee   The patient history, physical examination, clinical judgment of the provider and imaging studies are consistent with end stage degenerative joint disease of the left knee(s) and total knee arthroplasty is deemed medically necessary. The treatment options including medical management, injection therapy arthroscopy and arthroplasty were discussed at length. The risks and benefits of total knee arthroplasty were presented and reviewed. The risks due to aseptic loosening, infection, stiffness, patella tracking problems, thromboembolic complications and other imponderables were discussed. The patient acknowledged the explanation, agreed to proceed with the plan and consent was signed. Patient is being admitted for inpatient treatment for surgery, pain control, PT, OT, prophylactic antibiotics, VTE prophylaxis, progressive ambulation and ADL's and discharge planning. The patient is planning to be discharged home with home health services.     [place]   PA-C  02/15/2015, 2:16 PM

## 2015-02-16 MED ORDER — DEXTROSE 5 % IV SOLN
3.0000 g | INTRAVENOUS | Status: AC
  Administered 2015-02-17: 3 g via INTRAVENOUS
  Filled 2015-02-16 (×2): qty 3000

## 2015-02-17 ENCOUNTER — Inpatient Hospital Stay (HOSPITAL_COMMUNITY)
Admission: RE | Admit: 2015-02-17 | Discharge: 2015-02-18 | DRG: 470 | Disposition: A | Discharge status: Home Health | Payer: Commercial Managed Care - HMO | Source: Ambulatory Visit | Attending: Orthopedic Surgery | Admitting: Orthopedic Surgery

## 2015-02-17 ENCOUNTER — Inpatient Hospital Stay (HOSPITAL_COMMUNITY): Payer: Commercial Managed Care - HMO | Admitting: Anesthesiology

## 2015-02-17 ENCOUNTER — Encounter (HOSPITAL_COMMUNITY)
Admission: RE | Disposition: A | Discharge status: Home Health | Payer: Self-pay | Source: Ambulatory Visit | Attending: Orthopedic Surgery

## 2015-02-17 ENCOUNTER — Encounter (HOSPITAL_COMMUNITY): Payer: Self-pay | Admitting: *Deleted

## 2015-02-17 DIAGNOSIS — Z96652 Presence of left artificial knee joint: Secondary | ICD-10-CM

## 2015-02-17 DIAGNOSIS — H409 Unspecified glaucoma: Secondary | ICD-10-CM | POA: Diagnosis present

## 2015-02-17 DIAGNOSIS — Z8546 Personal history of malignant neoplasm of prostate: Secondary | ICD-10-CM | POA: Diagnosis not present

## 2015-02-17 DIAGNOSIS — G4730 Sleep apnea, unspecified: Secondary | ICD-10-CM | POA: Diagnosis present

## 2015-02-17 DIAGNOSIS — Z6841 Body Mass Index (BMI) 40.0 and over, adult: Secondary | ICD-10-CM

## 2015-02-17 DIAGNOSIS — Z85828 Personal history of other malignant neoplasm of skin: Secondary | ICD-10-CM

## 2015-02-17 DIAGNOSIS — E119 Type 2 diabetes mellitus without complications: Secondary | ICD-10-CM | POA: Diagnosis not present

## 2015-02-17 DIAGNOSIS — Z87891 Personal history of nicotine dependence: Secondary | ICD-10-CM

## 2015-02-17 DIAGNOSIS — M1712 Unilateral primary osteoarthritis, left knee: Principal | ICD-10-CM | POA: Diagnosis present

## 2015-02-17 DIAGNOSIS — M179 Osteoarthritis of knee, unspecified: Secondary | ICD-10-CM | POA: Diagnosis not present

## 2015-02-17 DIAGNOSIS — Z96659 Presence of unspecified artificial knee joint: Secondary | ICD-10-CM

## 2015-02-17 DIAGNOSIS — M25562 Pain in left knee: Secondary | ICD-10-CM | POA: Diagnosis present

## 2015-02-17 DIAGNOSIS — I10 Essential (primary) hypertension: Secondary | ICD-10-CM | POA: Diagnosis present

## 2015-02-17 HISTORY — PX: JOINT REPLACEMENT: SHX530

## 2015-02-17 HISTORY — PX: TOTAL KNEE ARTHROPLASTY: SHX125

## 2015-02-17 LAB — GLUCOSE, CAPILLARY
GLUCOSE-CAPILLARY: 139 mg/dL — AB (ref 70–99)
Glucose-Capillary: 186 mg/dL — ABNORMAL HIGH (ref 70–99)
Glucose-Capillary: 312 mg/dL — ABNORMAL HIGH (ref 70–99)

## 2015-02-17 LAB — TYPE AND SCREEN
ABO/RH(D): O POS
ANTIBODY SCREEN: NEGATIVE

## 2015-02-17 SURGERY — ARTHROPLASTY, KNEE, TOTAL
Anesthesia: Spinal | Site: Knee | Laterality: Left

## 2015-02-17 MED ORDER — BUPIVACAINE IN DEXTROSE 0.75-8.25 % IT SOLN
INTRATHECAL | Status: DC | PRN
  Administered 2015-02-17: 2 mL via INTRATHECAL

## 2015-02-17 MED ORDER — MIDAZOLAM HCL 2 MG/2ML IJ SOLN
INTRAMUSCULAR | Status: AC
Start: 2015-02-17 — End: 2015-02-17
  Filled 2015-02-17: qty 2

## 2015-02-17 MED ORDER — FLUTICASONE PROPIONATE 50 MCG/ACT NA SUSP
2.0000 | Freq: Every morning | NASAL | Status: DC
  Administered 2015-02-18: 2 via NASAL
  Filled 2015-02-17: qty 16

## 2015-02-17 MED ORDER — HYDROMORPHONE HCL 1 MG/ML IJ SOLN
0.5000 mg | INTRAMUSCULAR | Status: DC | PRN
  Administered 2015-02-17: 1 mg via INTRAVENOUS
  Filled 2015-02-17 (×2): qty 1

## 2015-02-17 MED ORDER — SODIUM CHLORIDE 0.9 % IJ SOLN
INTRAMUSCULAR | Status: AC
  Filled 2015-02-17: qty 50

## 2015-02-17 MED ORDER — ASPIRIN EC 325 MG PO TBEC
325.0000 mg | DELAYED_RELEASE_TABLET | Freq: Two times a day (BID) | ORAL | Status: DC
  Administered 2015-02-18: 325 mg via ORAL
  Filled 2015-02-17 (×3): qty 1

## 2015-02-17 MED ORDER — VENLAFAXINE HCL ER 75 MG PO CP24
75.0000 mg | ORAL_CAPSULE | Freq: Every day | ORAL | Status: DC
  Administered 2015-02-18: 75 mg via ORAL
  Filled 2015-02-17 (×2): qty 1

## 2015-02-17 MED ORDER — HYDROMORPHONE HCL 1 MG/ML IJ SOLN: 0.2500 mg | INTRAMUSCULAR | Status: DC | PRN

## 2015-02-17 MED ORDER — PHENYLEPHRINE HCL 10 MG/ML IJ SOLN
10.0000 mg | INTRAVENOUS | Status: DC | PRN
  Administered 2015-02-17: 40 ug/min via INTRAVENOUS

## 2015-02-17 MED ORDER — METOCLOPRAMIDE HCL 5 MG/ML IJ SOLN: 5.0000 mg | Freq: Three times a day (TID) | INTRAMUSCULAR | Status: DC | PRN

## 2015-02-17 MED ORDER — MAGNESIUM CITRATE PO SOLN: 1.0000 | Freq: Once | ORAL | Status: AC | PRN

## 2015-02-17 MED ORDER — DOCUSATE SODIUM 100 MG PO CAPS
100.0000 mg | ORAL_CAPSULE | Freq: Two times a day (BID) | ORAL | Status: DC
  Administered 2015-02-17 – 2015-02-18 (×2): 100 mg via ORAL

## 2015-02-17 MED ORDER — PHENOL 1.4 % MT LIQD
1.0000 | OROMUCOSAL | Status: DC | PRN
  Filled 2015-02-17: qty 177

## 2015-02-17 MED ORDER — ROSUVASTATIN CALCIUM 5 MG PO TABS
5.0000 mg | ORAL_TABLET | Freq: Every day | ORAL | Status: DC
  Administered 2015-02-17: 5 mg via ORAL
  Filled 2015-02-17 (×2): qty 1

## 2015-02-17 MED ORDER — LACTATED RINGERS IV SOLN
INTRAVENOUS | Status: DC
  Administered 2015-02-17: 13:00:00 via INTRAVENOUS
  Administered 2015-02-17: 1000 mL via INTRAVENOUS
  Administered 2015-02-17: 14:00:00 via INTRAVENOUS

## 2015-02-17 MED ORDER — ONDANSETRON HCL 4 MG/2ML IJ SOLN: 4.0000 mg | Freq: Four times a day (QID) | INTRAMUSCULAR | Status: DC | PRN

## 2015-02-17 MED ORDER — KETOROLAC TROMETHAMINE 30 MG/ML IJ SOLN
INTRAMUSCULAR | Status: DC | PRN
  Administered 2015-02-17: 30 mg

## 2015-02-17 MED ORDER — FENTANYL CITRATE 0.05 MG/ML IJ SOLN
INTRAMUSCULAR | Status: DC | PRN
Start: 2015-02-17 — End: 2015-02-17
  Administered 2015-02-17: 50 ug via INTRAVENOUS

## 2015-02-17 MED ORDER — PANTOPRAZOLE SODIUM 20 MG PO TBEC
20.0000 mg | DELAYED_RELEASE_TABLET | Freq: Every day | ORAL | Status: DC
  Administered 2015-02-18: 20 mg via ORAL
  Filled 2015-02-17: qty 1

## 2015-02-17 MED ORDER — HYDROCODONE-ACETAMINOPHEN 7.5-325 MG PO TABS
1.0000 | ORAL_TABLET | ORAL | Status: DC
  Administered 2015-02-17: 1 via ORAL
  Administered 2015-02-18 (×4): 2 via ORAL
  Filled 2015-02-17: qty 1
  Filled 2015-02-17 (×4): qty 2

## 2015-02-17 MED ORDER — FERROUS SULFATE 325 (65 FE) MG PO TABS
325.0000 mg | ORAL_TABLET | Freq: Three times a day (TID) | ORAL | Status: DC
  Administered 2015-02-18: 325 mg via ORAL
  Filled 2015-02-17 (×4): qty 1

## 2015-02-17 MED ORDER — PROPOFOL 10 MG/ML IV BOLUS
INTRAVENOUS | Status: AC
  Filled 2015-02-17: qty 20

## 2015-02-17 MED ORDER — METHOCARBAMOL 1000 MG/10ML IJ SOLN
500.0000 mg | Freq: Four times a day (QID) | INTRAVENOUS | Status: DC | PRN
  Administered 2015-02-18: 500 mg via INTRAVENOUS
  Filled 2015-02-17 (×2): qty 5

## 2015-02-17 MED ORDER — DIPHENHYDRAMINE HCL 25 MG PO CAPS: 25.0000 mg | ORAL_CAPSULE | Freq: Four times a day (QID) | ORAL | Status: DC | PRN

## 2015-02-17 MED ORDER — TRANEXAMIC ACID 100 MG/ML IV SOLN
1000.0000 mg | Freq: Once | INTRAVENOUS | Status: AC
  Administered 2015-02-17: 1000 mg via INTRAVENOUS
  Filled 2015-02-17: qty 10

## 2015-02-17 MED ORDER — PROPOFOL 10 MG/ML IV BOLUS
INTRAVENOUS | Status: DC | PRN
  Administered 2015-02-17: 20 mg via INTRAVENOUS
  Administered 2015-02-17: 40 mg via INTRAVENOUS
  Administered 2015-02-17 (×2): 20 mg via INTRAVENOUS

## 2015-02-17 MED ORDER — VENLAFAXINE HCL 75 MG PO TABS: 75.0000 mg | ORAL_TABLET | Freq: Every morning | ORAL | Status: DC

## 2015-02-17 MED ORDER — ONDANSETRON HCL 4 MG PO TABS: 4.0000 mg | ORAL_TABLET | Freq: Four times a day (QID) | ORAL | Status: DC | PRN

## 2015-02-17 MED ORDER — SODIUM CHLORIDE 0.9 % IJ SOLN
INTRAMUSCULAR | Status: DC | PRN
  Administered 2015-02-17: 30 mL

## 2015-02-17 MED ORDER — FENTANYL CITRATE 0.05 MG/ML IJ SOLN
INTRAMUSCULAR | Status: AC
  Filled 2015-02-17: qty 2

## 2015-02-17 MED ORDER — PIOGLITAZONE HCL 45 MG PO TABS
45.0000 mg | ORAL_TABLET | Freq: Every day | ORAL | Status: DC
  Administered 2015-02-18: 45 mg via ORAL
  Filled 2015-02-17 (×2): qty 1

## 2015-02-17 MED ORDER — DORZOLAMIDE HCL-TIMOLOL MAL 2-0.5 % OP SOLN
1.0000 [drp] | Freq: Two times a day (BID) | OPHTHALMIC | Status: DC
  Administered 2015-02-17 – 2015-02-18 (×2): 1 [drp] via OPHTHALMIC
  Filled 2015-02-17: qty 10

## 2015-02-17 MED ORDER — AMLODIPINE BESYLATE 5 MG PO TABS
5.0000 mg | ORAL_TABLET | Freq: Once | ORAL | Status: AC
  Administered 2015-02-17: 5 mg via ORAL
  Filled 2015-02-17: qty 1

## 2015-02-17 MED ORDER — PROMETHAZINE HCL 25 MG/ML IJ SOLN: 6.2500 mg | INTRAMUSCULAR | Status: DC | PRN

## 2015-02-17 MED ORDER — METOCLOPRAMIDE HCL 10 MG PO TABS: 5.0000 mg | ORAL_TABLET | Freq: Three times a day (TID) | ORAL | Status: DC | PRN

## 2015-02-17 MED ORDER — LORATADINE 10 MG PO TABS
10.0000 mg | ORAL_TABLET | Freq: Every day | ORAL | Status: DC
  Administered 2015-02-17 – 2015-02-18 (×2): 10 mg via ORAL
  Filled 2015-02-17 (×2): qty 1

## 2015-02-17 MED ORDER — BUPIVACAINE LIPOSOME 1.3 % IJ SUSP
20.0000 mL | Freq: Once | INTRAMUSCULAR | Status: DC
  Filled 2015-02-17: qty 20

## 2015-02-17 MED ORDER — KETOROLAC TROMETHAMINE 30 MG/ML IJ SOLN
INTRAMUSCULAR | Status: AC
  Filled 2015-02-17: qty 1

## 2015-02-17 MED ORDER — CEFAZOLIN SODIUM-DEXTROSE 2-3 GM-% IV SOLR
2.0000 g | Freq: Four times a day (QID) | INTRAVENOUS | Status: AC
  Administered 2015-02-17 – 2015-02-18 (×2): 2 g via INTRAVENOUS
  Filled 2015-02-17 (×2): qty 50

## 2015-02-17 MED ORDER — CHLORHEXIDINE GLUCONATE 4 % EX LIQD: 60.0000 mL | Freq: Once | CUTANEOUS | Status: DC

## 2015-02-17 MED ORDER — METHOCARBAMOL 500 MG PO TABS
500.0000 mg | ORAL_TABLET | Freq: Four times a day (QID) | ORAL | Status: DC | PRN
  Administered 2015-02-18: 500 mg via ORAL
  Filled 2015-02-17: qty 1

## 2015-02-17 MED ORDER — DEXAMETHASONE SODIUM PHOSPHATE 10 MG/ML IJ SOLN
10.0000 mg | Freq: Once | INTRAMUSCULAR | Status: AC
  Administered 2015-02-17: 10 mg via INTRAVENOUS

## 2015-02-17 MED ORDER — PROPOFOL INFUSION 10 MG/ML OPTIME
INTRAVENOUS | Status: DC | PRN
  Administered 2015-02-17: 100 ug/kg/min via INTRAVENOUS

## 2015-02-17 MED ORDER — BISACODYL 10 MG RE SUPP: 10.0000 mg | Freq: Every day | RECTAL | Status: DC | PRN

## 2015-02-17 MED ORDER — DEXAMETHASONE SODIUM PHOSPHATE 10 MG/ML IJ SOLN
10.0000 mg | Freq: Once | INTRAMUSCULAR | Status: DC
  Filled 2015-02-17: qty 1

## 2015-02-17 MED ORDER — SODIUM CHLORIDE 0.9 % IV SOLN
INTRAVENOUS | Status: DC
  Administered 2015-02-17: 19:00:00 via INTRAVENOUS
  Filled 2015-02-17 (×2): qty 1000

## 2015-02-17 MED ORDER — GLIPIZIDE 5 MG PO TABS
5.0000 mg | ORAL_TABLET | Freq: Every day | ORAL | Status: DC
  Administered 2015-02-18: 5 mg via ORAL
  Filled 2015-02-17 (×2): qty 1

## 2015-02-17 MED ORDER — METFORMIN HCL 500 MG PO TABS
1000.0000 mg | ORAL_TABLET | Freq: Two times a day (BID) | ORAL | Status: DC
Start: 2015-02-17 — End: 2015-02-18
  Administered 2015-02-17 – 2015-02-18 (×2): 1000 mg via ORAL
  Filled 2015-02-17 (×4): qty 2

## 2015-02-17 MED ORDER — MENTHOL 3 MG MT LOZG: 1.0000 | LOZENGE | OROMUCOSAL | Status: DC | PRN

## 2015-02-17 MED ORDER — BUPIVACAINE-EPINEPHRINE (PF) 0.25% -1:200000 IJ SOLN
INTRAMUSCULAR | Status: AC
  Filled 2015-02-17: qty 30

## 2015-02-17 MED ORDER — ALUM & MAG HYDROXIDE-SIMETH 200-200-20 MG/5ML PO SUSP: 30.0000 mL | ORAL | Status: DC | PRN

## 2015-02-17 MED ORDER — CELECOXIB 200 MG PO CAPS
200.0000 mg | ORAL_CAPSULE | Freq: Two times a day (BID) | ORAL | Status: DC
  Administered 2015-02-17 – 2015-02-18 (×2): 200 mg via ORAL
  Filled 2015-02-17 (×3): qty 1

## 2015-02-17 MED ORDER — POLYETHYLENE GLYCOL 3350 17 G PO PACK
17.0000 g | PACK | Freq: Two times a day (BID) | ORAL | Status: DC
  Administered 2015-02-17 – 2015-02-18 (×2): 17 g via ORAL

## 2015-02-17 MED ORDER — BUPIVACAINE-EPINEPHRINE (PF) 0.25% -1:200000 IJ SOLN
INTRAMUSCULAR | Status: DC | PRN
  Administered 2015-02-17: 30 mL

## 2015-02-17 MED ORDER — MIDAZOLAM HCL 5 MG/5ML IJ SOLN
INTRAMUSCULAR | Status: DC | PRN
  Administered 2015-02-17: 2 mg via INTRAVENOUS

## 2015-02-17 SURGICAL SUPPLY — 55 items
BAG DECANTER FOR FLEXI CONT (MISCELLANEOUS) IMPLANT
BAG ZIPLOCK 12X15 (MISCELLANEOUS) IMPLANT
BANDAGE ELASTIC 6 VELCRO ST LF (GAUZE/BANDAGES/DRESSINGS) ×3 IMPLANT
BANDAGE ESMARK 6X9 LF (GAUZE/BANDAGES/DRESSINGS) ×1 IMPLANT
BLADE SAW SGTL 13.0X1.19X90.0M (BLADE) ×3 IMPLANT
BNDG ESMARK 6X9 LF (GAUZE/BANDAGES/DRESSINGS) ×3
BONE CEMENT GENTAMICIN (Cement) ×6 IMPLANT
BOWL SMART MIX CTS (DISPOSABLE) ×3 IMPLANT
CAPT KNEE TOTAL 3 ATTUNE ×3 IMPLANT
CEMENT BONE GENTAMICIN 40 (Cement) ×2 IMPLANT
CUFF TOURN SGL QUICK 34 (TOURNIQUET CUFF) ×2
CUFF TRNQT CYL 34X4X40X1 (TOURNIQUET CUFF) ×1 IMPLANT
DECANTER SPIKE VIAL GLASS SM (MISCELLANEOUS) ×3 IMPLANT
DERMABOND ADVANCED (GAUZE/BANDAGES/DRESSINGS) ×2
DERMABOND ADVANCED .7 DNX12 (GAUZE/BANDAGES/DRESSINGS) ×1 IMPLANT
DRAPE EXTREMITY T 121X128X90 (DRAPE) ×3 IMPLANT
DRAPE POUCH INSTRU U-SHP 10X18 (DRAPES) ×3 IMPLANT
DRAPE U-SHAPE 47X51 STRL (DRAPES) ×3 IMPLANT
DRSG AQUACEL AG ADV 3.5X10 (GAUZE/BANDAGES/DRESSINGS) ×3 IMPLANT
DURAPREP 26ML APPLICATOR (WOUND CARE) ×6 IMPLANT
ELECT REM PT RETURN 9FT ADLT (ELECTROSURGICAL) ×3
ELECTRODE REM PT RTRN 9FT ADLT (ELECTROSURGICAL) ×1 IMPLANT
FACESHIELD WRAPAROUND (MASK) ×15 IMPLANT
GLOVE BIOGEL PI IND STRL 7.5 (GLOVE) ×1 IMPLANT
GLOVE BIOGEL PI IND STRL 8.5 (GLOVE) ×1 IMPLANT
GLOVE BIOGEL PI INDICATOR 7.5 (GLOVE) ×2
GLOVE BIOGEL PI INDICATOR 8.5 (GLOVE) ×2
GLOVE ECLIPSE 8.0 STRL XLNG CF (GLOVE) ×3 IMPLANT
GLOVE ORTHO TXT STRL SZ7.5 (GLOVE) ×6 IMPLANT
GOWN SPEC L3 XXLG W/TWL (GOWN DISPOSABLE) ×3 IMPLANT
GOWN STRL REUS W/TWL LRG LVL3 (GOWN DISPOSABLE) ×3 IMPLANT
HANDPIECE INTERPULSE COAX TIP (DISPOSABLE) ×2
KIT BASIN OR (CUSTOM PROCEDURE TRAY) ×3 IMPLANT
LIQUID BAND (GAUZE/BANDAGES/DRESSINGS) ×3 IMPLANT
MANIFOLD NEPTUNE II (INSTRUMENTS) ×3 IMPLANT
NDL SAFETY ECLIPSE 18X1.5 (NEEDLE) ×1 IMPLANT
NEEDLE HYPO 18GX1.5 SHARP (NEEDLE) ×2
PACK TOTAL JOINT (CUSTOM PROCEDURE TRAY) ×3 IMPLANT
PEN SKIN MARKING BROAD (MISCELLANEOUS) ×3 IMPLANT
POSITIONER SURGICAL ARM (MISCELLANEOUS) ×3 IMPLANT
SET HNDPC FAN SPRY TIP SCT (DISPOSABLE) ×1 IMPLANT
SET PAD KNEE POSITIONER (MISCELLANEOUS) ×3 IMPLANT
SUCTION FRAZIER 12FR DISP (SUCTIONS) ×3 IMPLANT
SUT MNCRL AB 4-0 PS2 18 (SUTURE) ×3 IMPLANT
SUT VIC AB 1 CT1 36 (SUTURE) ×3 IMPLANT
SUT VIC AB 2-0 CT1 27 (SUTURE) ×6
SUT VIC AB 2-0 CT1 TAPERPNT 27 (SUTURE) ×3 IMPLANT
SUT VLOC 180 0 24IN GS25 (SUTURE) ×3 IMPLANT
SYR 50ML LL SCALE MARK (SYRINGE) ×3 IMPLANT
TOWEL OR 17X26 10 PK STRL BLUE (TOWEL DISPOSABLE) ×3 IMPLANT
TOWEL OR NON WOVEN STRL DISP B (DISPOSABLE) IMPLANT
TRAY FOLEY CATH 14FRSI W/METER (CATHETERS) ×3 IMPLANT
WATER STERILE IRR 1500ML POUR (IV SOLUTION) ×3 IMPLANT
WRAP KNEE MAXI GEL POST OP (GAUZE/BANDAGES/DRESSINGS) ×3 IMPLANT
YANKAUER SUCT BULB TIP 10FT TU (MISCELLANEOUS) ×3 IMPLANT

## 2015-02-17 NOTE — Interval H&P Note (Signed)
History and Physical Interval Note:  02/17/2015 11:28 AM  Justin Cohen  has presented today for surgery, with the diagnosis of left knee osteoarthritis  The various methods of treatment have been discussed with the patient and family. After consideration of risks, benefits and other options for treatment, the patient has consented to  Procedure(s): LEFT TOTAL KNEE ARTHROPLASTY (Left) as a surgical intervention .  The patient's history has been reviewed, patient examined, no change in status, stable for surgery.  I have reviewed the patient's chart and labs.  Questions were answered to the patient's satisfaction.     [name]

## 2015-02-17 NOTE — Op Note (Signed)
NAME:  Justin Cohen                      MEDICAL RECORD NO.:  [id number]                             FACILITY:  [facility]      PHYSICIAN:  Pietro Cassis. Alvan Dame, M.D.  DATE OF BIRTH:  [birth year]      DATE OF PROCEDURE:  02/17/2015                                     OPERATIVE REPORT         PREOPERATIVE DIAGNOSIS:  Left knee osteoarthritis.      POSTOPERATIVE DIAGNOSIS:  Left knee osteoarthritis.      FINDINGS:  The patient was noted to have complete loss of cartilage and   bone-on-bone arthritis with associated osteophytes in the medial and patellofemoral compartments of   the knee with a significant synovitis and associated effusion.      PROCEDURE:  Left total knee replacement.      COMPONENTS USED:  DePuy Attune rotating platform posterior stabilized knee   system, a size 8 femur, 8 tibia, 7 mm PS AOX insert, and 41 patellar   button.      SURGEON:  Pietro Cassis. Alvan Dame, M.D.      ASSISTANT:  Danae Orleans, PA-C.      ANESTHESIA:  Spinal.      SPECIMENS:  None.      COMPLICATION:  None.      DRAINS:  None.  EBL: <100cc      TOURNIQUET TIME:  30min at 346mmHg .      The patient was stable to the recovery room.      INDICATION FOR PROCEDURE:  Justin Cohen is a 69 y.o. male patient of   mine.  The patient had been seen, evaluated, and treated conservatively in the   office with medication, activity modification, and injections.  The patient had   radiographic changes of bone-on-bone arthritis with endplate sclerosis and osteophytes noted.      The patient failed conservative measures including medication, injections, and activity modification, and at this point was ready for more definitive measures.   Based on the radiographic changes and failed conservative measures, the patient   decided to proceed with total knee replacement.  Risks of infection,   DVT, component failure, need for revision surgery, postop course, and   expectations were all   discussed and reviewed.   Consent was obtained for benefit of pain   relief.      PROCEDURE IN DETAIL:  The patient was brought to the operative theater.   Once adequate anesthesia, preoperative antibiotics, 3 gm of Ancef, 1gm Tranexamic Acid, and 10mg  of Decadron administered, the patient was positioned supine with the left thigh tourniquet placed.  The  left lower extremity was prepped and draped in sterile fashion.  A time-   out was performed identifying the patient, planned procedure, and   extremity.      The left lower extremity was placed in the [facility] leg holder.  The leg was   exsanguinated, tourniquet elevated to 250 mmHg.  A midline incision was   made followed by median parapatellar arthrotomy.  Following initial   exposure, attention was first directed to the patella.  Precut  measurement was noted to be 26 mm.  I resected down to 15 mm and used a   41 patellar button to restore patellar height as well as cover the cut   surface.      The lug holes were drilled and a metal shim was placed to protect the   patella from retractors and saw blades.      At this point, attention was now directed to the femur.  The femoral   canal was opened with a drill, irrigated to try to prevent fat emboli.  An   intramedullary rod was passed at 5 degrees valgus, 9 mm of bone was   resected off the distal femur.  Following this resection, the tibia was   subluxated anteriorly.  Using the extramedullary guide, 2 mm of bone was resected off   the proximal medial tibia.  We confirmed the gap would be   stable medially and laterally with a size 6 mm insert as well as confirmed   the cut was perpendicular in the coronal plane, checking with an alignment rod.      Once this was done, I sized the femur to be a size 8 in the anterior-   posterior dimension, chose a standard component based on medial and   lateral dimension.  The size 8 rotation block was then pinned in   position anterior referenced using the C-clamp to  set rotation.  The   anterior, posterior, and  chamfer cuts were made without difficulty nor   notching making certain that I was along the anterior cortex to help   with flexion gap stability.      The final box cut was made off the lateral aspect of distal femur.      At this point, the tibia was sized to be a size 8, the size 8 tray was   then pinned in position through the medial third of the tubercle,   drilled, and keel punched.  Trial reduction was now carried with a 8 femur,  8 tibia, a size 7 mm PS insert, and the 41 patella botton.  The knee was brought to   extension, full extension with good flexion stability with the patella   tracking through the trochlea without application of pressure.  Given   all these findings, the trial components removed.  Final components were   opened and cement was mixed.  The knee was irrigated with normal saline   solution and pulse lavage.  The synovial lining was   then injected with 30cc of 0.25% Marcaine with epinephrine and 1 cc of Toradol plus 30cc of NS for a   total of 61 cc.      The knee was irrigated.  Final implants were then cemented onto clean and   dried cut surfaces of bone with the knee brought to extension with a size 7 mm trial insert.      Once the cement had fully cured, the excess cement was removed   throughout the knee.  I confirmed I was satisfied with the range of   motion and stability, and the final 7 mm PS AOX insert was chosen.  It was   placed into the knee.      The tourniquet had been let down at 35 minutes.  No significant   hemostasis required.  The   extensor mechanism was then reapproximated using #1 Vicryl with the knee   in flexion.  The   remaining wound was  closed with 2-0 Vicryl and running 4-0 Monocryl.   The knee was cleaned, dried, dressed sterilely using Dermabond and   Aquacel dressing.  The patient was then   brought to recovery room in stable condition, tolerating the procedure   well.    Please note that Physician Assistant, Danae Orleans, PA-C, was present for the entirety of the case, and was utilized for pre-operative positioning, peri-operative retractor management, general facilitation of the procedure.  He was also utilized for primary wound closure at the end of the case.              Pietro Cassis Alvan Dame, M.D.    02/17/2015 2:06 PM

## 2015-02-17 NOTE — Anesthesia Procedure Notes (Signed)
Spinal Patient location during procedure: OR Start time: 02/17/2015 12:25 PM End time: 02/17/2015 12:31 PM Reason for block: at surgeon's request Staffing Resident/CRNA: Anne Fu Performed by: resident/CRNA  Preanesthetic Checklist Completed: patient identified, site marked, surgical consent, pre-op evaluation, timeout performed, IV checked, risks and benefits discussed, monitors and equipment checked and at surgeon's request Spinal Block Patient position: sitting Approach: right paramedian Location: L2-3 Injection technique: single-shot Needle Needle type: Spinocan  Needle gauge: 22 G Needle length: 9 cm Assessment Sensory level: T6 Additional Notes Expiration date of kit checked and confirmed. Patient tolerated procedure well, without complications. X 1 attempt with noted clear CSF return. Loss of motor and sensory on exam post injection.

## 2015-02-17 NOTE — Transfer of Care (Signed)
Immediate Anesthesia Transfer of Care Note  Patient: Justin Cohen  Procedure(s) Performed: Procedure(s) (LRB): LEFT TOTAL KNEE ARTHROPLASTY (Left)  Patient Location: PACU  Anesthesia Type: Spinal  Level of Consciousness: sedated, patient cooperative and responds to stimulation  Airway & Oxygen Therapy: Patient Spontanous Breathing and Patient connected to face mask oxgen  Post-op Assessment: Report given to PACU RN and Post -op Vital signs reviewed and stable  Post vital signs: Reviewed and stable  Complications: No apparent anesthesia complications, U-03 level on exam denied pain on assessment.

## 2015-02-17 NOTE — Progress Notes (Signed)
Utilization review completed.  

## 2015-02-17 NOTE — Plan of Care (Signed)
Problem: Consults Goal: Diagnosis- Total Joint Replacement Primary Total Knee, Left     

## 2015-02-17 NOTE — Anesthesia Preprocedure Evaluation (Signed)
Anesthesia Evaluation  Patient identified by MRN, date of birth, ID band Patient awake    Reviewed: Allergy & Precautions, NPO status , Patient's Chart, lab work & pertinent test results  Airway Mallampati: II  TM Distance: >3 FB Neck ROM: Full    Dental no notable dental hx.    Pulmonary sleep apnea and Continuous Positive Airway Pressure Ventilation , former smoker,  breath sounds clear to auscultation  Pulmonary exam normal       Cardiovascular hypertension, Rhythm:Regular Rate:Normal     Neuro/Psych negative neurological ROS  negative psych ROS   GI/Hepatic negative GI ROS, Neg liver ROS,   Endo/Other  diabetesMorbid obesity  Renal/GU negative Renal ROS  negative genitourinary   Musculoskeletal negative musculoskeletal ROS (+)   Abdominal   Peds negative pediatric ROS (+)  Hematology negative hematology ROS (+)   Anesthesia Other Findings   Reproductive/Obstetrics negative OB ROS                             Anesthesia Physical Anesthesia Plan  ASA: III  Anesthesia Plan: Spinal   Post-op Pain Management:    Induction: Intravenous  Airway Management Planned: Simple Face Mask  Additional Equipment:   Intra-op Plan:   Post-operative Plan:   Informed Consent: I have reviewed the patients History and Physical, chart, labs and discussed the procedure including the risks, benefits and alternatives for the proposed anesthesia with the patient or authorized representative who has indicated his/her understanding and acceptance.   Dental advisory given  Plan Discussed with: CRNA and Surgeon  Anesthesia Plan Comments:         Anesthesia Quick Evaluation

## 2015-02-17 NOTE — Progress Notes (Signed)
Completed all 8 doses of Cipro 500 mg PO pre op for left total knee

## 2015-02-18 ENCOUNTER — Encounter (HOSPITAL_COMMUNITY): Payer: Self-pay | Admitting: Orthopedic Surgery

## 2015-02-18 LAB — BASIC METABOLIC PANEL
ANION GAP: 10 (ref 5–15)
BUN: 23 mg/dL (ref 6–23)
CO2: 22 mmol/L (ref 19–32)
Calcium: 8.6 mg/dL (ref 8.4–10.5)
Chloride: 106 mmol/L (ref 96–112)
Creatinine, Ser: 1.1 mg/dL (ref 0.50–1.35)
GFR calc Af Amer: 77 mL/min — ABNORMAL LOW (ref >90)
GFR calc non Af Amer: 66 mL/min — ABNORMAL LOW (ref >90)
GLUCOSE: 278 mg/dL — AB (ref 70–99)
POTASSIUM: 4.4 mmol/L (ref 3.5–5.1)
SODIUM: 138 mmol/L (ref 135–145)

## 2015-02-18 LAB — CBC
HCT: 41.3 % (ref 39.0–52.0)
Hemoglobin: 13.8 g/dL (ref 13.0–17.0)
MCH: 29.6 pg (ref 26.0–34.0)
MCHC: 33.4 g/dL (ref 30.0–36.0)
MCV: 88.4 fL (ref 78.0–100.0)
Platelets: 313 10*3/uL (ref 150–400)
RBC: 4.67 MIL/uL (ref 4.22–5.81)
RDW: 14.3 % (ref 11.5–15.5)
WBC: 10.3 10*3/uL (ref 4.0–10.5)

## 2015-02-18 LAB — GLUCOSE, CAPILLARY
GLUCOSE-CAPILLARY: 243 mg/dL — AB (ref 70–99)
Glucose-Capillary: 236 mg/dL — ABNORMAL HIGH (ref 70–99)

## 2015-02-18 MED ORDER — POLYETHYLENE GLYCOL 3350 17 G PO PACK: 17.0000 g | PACK | Freq: Two times a day (BID) | ORAL | Status: DC

## 2015-02-18 MED ORDER — ASPIRIN 325 MG PO TBEC: 325.0000 mg | DELAYED_RELEASE_TABLET | Freq: Two times a day (BID) | ORAL | Status: AC

## 2015-02-18 MED ORDER — HYDROCODONE-ACETAMINOPHEN 7.5-325 MG PO TABS: 1.0000 | ORAL_TABLET | ORAL | Status: DC | PRN

## 2015-02-18 MED ORDER — FERROUS SULFATE 325 (65 FE) MG PO TABS
325.0000 mg | ORAL_TABLET | Freq: Three times a day (TID) | ORAL | Status: DC
Start: 2015-02-18 — End: 2015-05-21

## 2015-02-18 MED ORDER — TIZANIDINE HCL 4 MG PO TABS: 4.0000 mg | ORAL_TABLET | Freq: Four times a day (QID) | ORAL | Status: DC | PRN

## 2015-02-18 MED ORDER — DOCUSATE SODIUM 100 MG PO CAPS: 100.0000 mg | ORAL_CAPSULE | Freq: Two times a day (BID) | ORAL | Status: DC

## 2015-02-18 MED ORDER — CELECOXIB 200 MG PO CAPS: 200.0000 mg | ORAL_CAPSULE | Freq: Two times a day (BID) | ORAL | Status: DC

## 2015-02-18 NOTE — Discharge Instructions (Signed)

## 2015-02-18 NOTE — Evaluation (Addendum)
Physical Therapy Evaluation Patient Details Name: Justin Cohen MRN: [id number] DOB: 1946/11/03 Today's Date: 02/18/2015   History of Present Illness  s/p L TKA  Clinical Impression  Pt admitted with above diagnosis. Pt currently with functional limitations due to the deficits listed below (see PT Problem List).  Pt will benefit from skilled PT to increase their independence and safety with mobility to allow discharge to the venue listed below.       Follow Up Recommendations Home health PT    Equipment Recommendations  None recommended by PT    Recommendations for Other Services       Precautions / Restrictions Precautions Precautions: Knee Restrictions Weight Bearing Restrictions: No Other Position/Activity Restrictions: WBAT      Mobility  Bed Mobility Overal bed mobility: Needs Assistance Bed Mobility: Supine to Sit     Supine to sit: Supervision     General bed mobility comments: incr time  Transfers Overall transfer level: Needs assistance Equipment used: Rolling walker (2 wheeled) Transfers: Sit to/from Stand Sit to Stand: Min guard         General transfer comment: cues for hand and  LE placement  Ambulation/Gait Ambulation/Gait assistance: Min guard;Min assist Ambulation Distance (Feet): 110 Feet Assistive device: Rolling walker (2 wheeled) Gait Pattern/deviations: Step-to pattern;Step-through pattern;Decreased weight shift to left     General Gait Details: cues for sequence and RW safety  Stairs            Wheelchair Mobility    Modified Rankin (Stroke Patients Only)       Balance                                             Pertinent Vitals/Pain Pain Assessment: 0-10 Pain Score: 4  Pain Location: L knee Pain Descriptors / Indicators: Aching Pain Intervention(s): Limited activity within patient's tolerance;Premedicated before session;Monitored during session    Home Living Family/patient expects to be  discharged to:: Private residence Living Arrangements: Spouse/significant other Available Help at Discharge: Family Type of Home: House Home Access: Stairs to enter Entrance Stairs-Rails: Right Entrance Stairs-Number of Steps: 3 Home Layout: One level Home Equipment: Bedside commode      Prior Function Level of Independence: Independent               Hand Dominance        Extremity/Trunk Assessment   Upper Extremity Assessment: Overall WFL for tasks assessed;Defer to OT evaluation           Lower Extremity Assessment: LLE deficits/detail   LLE Deficits / Details: ankle WFL, knee extension and hip flexion  3 to 3+/5     Communication   Communication: No difficulties  Cognition Arousal/Alertness: Awake/alert Behavior During Therapy: WFL for tasks assessed/performed Overall Cognitive Status: Within Functional Limits for tasks assessed                      General Comments      Exercises Total Joint Exercises Ankle Circles/Pumps: AROM;Both;10 reps Quad Sets: AROM;10 reps;Both Straight Leg Raises: AROM;5 reps;Left      Assessment/Plan    PT Assessment Patient needs continued PT services  PT Diagnosis Difficulty walking   PT Problem List Decreased strength;Decreased range of motion;Decreased activity tolerance;Decreased mobility;Decreased knowledge of use of DME  PT Treatment Interventions Functional mobility training;Therapeutic activities;Gait training;DME instruction;Therapeutic exercise;Patient/family education   PT  Goals (Current goals can be found in the Care Plan section) Acute Rehab PT Goals Patient Stated Goal: home today PT Goal Formulation: With patient Time For Goal Achievement: 02/19/15 Potential to Achieve Goals: Good    Frequency 7X/week   Barriers to discharge        Co-evaluation               End of Session Equipment Utilized During Treatment: Gait belt Activity Tolerance: Patient tolerated treatment  well Patient left: in chair;with call bell/phone within reach;with family/visitor present Nurse Communication: Mobility status         Time: 1000-1027 PT Time Calculation (min) (ACUTE ONLY): 27 min   Charges:   PT Evaluation $Initial PT Evaluation Tier I: 1 Procedure PT Treatments $Gait Training: 8-22 mins   PT G Codes:        Ryne Mctigue 20-Feb-2015, 2:39 PM

## 2015-02-18 NOTE — Anesthesia Postprocedure Evaluation (Signed)
  Anesthesia Post-op Note  Patient: Justin Cohen  Procedure(s) Performed: Procedure(s) (LRB): LEFT TOTAL KNEE ARTHROPLASTY (Left)  Patient Location: PACU  Anesthesia Type: Spinal   Level of Consciousness: awake and alert   Airway and Oxygen Therapy: Patient Spontanous Breathing  Post-op Pain: mild  Post-op Assessment: Post-op Vital signs reviewed, Patient's Cardiovascular Status Stable, Respiratory Function Stable, Patent Airway and No signs of Nausea or vomiting  Last Vitals:  Filed Vitals:   02/18/15 0514  BP: 149/67  Pulse: 71  Temp: 36.8 C  Resp: 16    Post-op Vital Signs: stable   Complications: No apparent anesthesia complications

## 2015-02-18 NOTE — Evaluation (Signed)
Occupational Therapy Evaluation Patient Details Name: Justin Cohen MRN: [id number] DOB: Apr 03, 1946 Today's Date: 02/18/2015    History of Present Illness s/p L TKA   Clinical Impression   This 69 year old man was admitted for the above surgery.  All education was completed.  No further OT is needed at this time.      Follow Up Recommendations  No OT follow up;Supervision/Assistance - 24 hour (initially)    Equipment Recommendations  None recommended by OT    Recommendations for Other Services       Precautions / Restrictions Precautions Precautions: None Restrictions Weight Bearing Restrictions: No Other Position/Activity Restrictions: WBAT      Mobility Bed Mobility                  Transfers Overall transfer level: Needs assistance Equipment used: Rolling walker (2 wheeled) Transfers: Sit to/from Stand Sit to Stand: Min guard         General transfer comment: cues for LE placement    Balance                                            ADL Overall ADL's : Needs assistance/impaired                         Toilet Transfer: Min guard;Ambulation       Tub/ Shower Transfer: Walk-in shower;Min guard;Ambulation     General ADL Comments: wife will assist with ADLs as needed.  Pt has BSC from previous surgery.  Educated on positioning in shower and wiping pins on legs to avoid rusting.  Pt return demonstrated shower transfer.  He plans to stay in guest room initially as his bed is very high.     Vision     Perception     Praxis      Pertinent Vitals/Pain Pain Assessment: 0-10 Pain Score: 4  Pain Location:  l knee Pain Descriptors / Indicators: Aching Pain Intervention(s): Limited activity within patient's tolerance;Monitored during session;Premedicated before session;Repositioned;Ice applied     Hand Dominance     Extremity/Trunk Assessment Upper Extremity Assessment Upper Extremity Assessment: Overall WFL  for tasks assessed           Communication Communication Communication: No difficulties   Cognition Arousal/Alertness: Awake/alert Behavior During Therapy: WFL for tasks assessed/performed Overall Cognitive Status: Within Functional Limits for tasks assessed                     General Comments       Exercises       Shoulder Instructions      Home Living Family/patient expects to be discharged to:: Private residence Living Arrangements: Spouse/significant other   Type of Home: House Home Access: Stairs to enter Technical brewer of Steps: 3 Entrance Stairs-Rails: Right Home Layout: One level     Bathroom Shower/Tub: Occupational psychologist: Standard     Home Equipment: Bedside commode          Prior Functioning/Environment Level of Independence: Independent             OT Diagnosis: Generalized weakness   OT Problem List:     OT Treatment/Interventions:      OT Goals(Current goals can be found in the care plan section)    OT Frequency:     Barriers  to D/C:            Co-evaluation              End of Session    Activity Tolerance: Patient tolerated treatment well Patient left: in chair;with call bell/phone within reach;with family/visitor present   Time: 0340-3524 OT Time Calculation (min): 10 min Charges:  OT General Charges $OT Visit: 1 Procedure OT Evaluation $Initial OT Evaluation Tier I: 1 Procedure G-Codes:    Adolfo Granieri Mar 14, 2015, 11:29 AM  Lesle Chris, OTR/L [phone] 03-14-2015

## 2015-02-18 NOTE — Progress Notes (Signed)
   02/18/15 1442  PT Visit Information  Last PT Received On 02/18/15  Assistance Needed +1  History of Present Illness s/p L TKA  PT Time Calculation  PT Start Time (ACUTE ONLY) 1400  PT Stop Time (ACUTE ONLY) 1435  PT Time Calculation (min) (ACUTE ONLY) 35 min  Subjective Data  Subjective I am sleepy  Patient Stated Goal home today  Precautions  Precautions Knee  Restrictions  Weight Bearing Restrictions No  Other Position/Activity Restrictions WBAT  Pain Assessment  Pain Assessment 0-10  Pain Score 3  Pain Location L knee  Pain Descriptors / Indicators Aching  Pain Intervention(s) Limited activity within patient's tolerance;Monitored during session;Premedicated before session  Cognition  Arousal/Alertness Awake/alert  Behavior During Therapy WFL for tasks assessed/performed  Overall Cognitive Status Within Functional Limits for tasks assessed  Transfers  Overall transfer level Needs assistance  Equipment used Rolling walker (2 wheeled)  Transfers Sit to/from Stand  Sit to Stand Min guard  General transfer comment cues for hand and  LE placement  Ambulation/Gait  Ambulation/Gait assistance Supervision;Min guard  Ambulation Distance (Feet) 120 Feet  Assistive device Rolling walker (2 wheeled)  Gait Pattern/deviations Step-to pattern;Step-through pattern;Antalgic;Trunk flexed  General Gait Details cues for sequence and RW safety  Stairs Yes  Stairs assistance Min guard  Stair Management One rail Right;One rail Left;Forwards;With crutches;Step to pattern  Number of Stairs 5  General stair comments cues for sequence  Total Joint Exercises  Ankle Circles/Pumps AROM;Both;10 reps  Quad Sets AROM;10 reps;Both  Straight Leg Raises AROM;Strengthening;10 reps;Left  Short Arc Quad AROM;AAROM;Left;10 reps  Heel Slides AAROM;Left;10 reps  Hip ABduction/ADduction AAROM;Left;10 reps  Goniometric ROM ~8 to 65*  PT - End of Session  Equipment Utilized During Treatment Gait belt   Activity Tolerance Patient tolerated treatment well  Patient left in chair;with call bell/phone within reach;with family/visitor present  Nurse Communication Mobility status  PT - Assessment/Plan  PT Plan Current plan remains appropriate  PT Frequency (ACUTE ONLY) 7X/week  Follow Up Recommendations Home health PT;Supervision for mobility/OOB  PT equipment None recommended by PT  PT Goal Progression  Progress towards PT goals Progressing toward goals  Acute Rehab PT Goals  PT Goal Formulation With patient  Time For Goal Achievement 02/19/15  Potential to Achieve Goals Good  PT General Charges  $$ ACUTE PT VISIT 1 Procedure  PT Treatments  $Gait Training 8-22 mins  $Therapeutic Exercise 8-22 mins

## 2015-02-18 NOTE — Care Management Note (Signed)
    Page 1 of 1   02/18/2015     11:01:47 AM CARE MANAGEMENT NOTE 02/18/2015  Patient:  Justin Cohen, Justin Cohen   Account Number:  [id number]  Date Initiated:  02/18/2015  Documentation initiated by:  Sunday Spillers  Subjective/Objective Assessment:   69 yo male admitted s/p Left total knee replacement.     Action/Plan:   Discharge planning   Anticipated DC Date:  02/18/2015   Anticipated DC Plan:  [place]  CM consult      [facility] Choice  HOME HEALTH   Choice offered to / List presented to:  C-1 Patient        [place] arranged  [place] PT      [place].   Status of service:  Completed, signed off Medicare Important Message given?   (If response is "NO", the following Medicare IM given date fields will be blank) Date Medicare IM given:   Medicare IM given by:   Date Additional Medicare IM given:   Additional Medicare IM given by:    Discharge Disposition:  [place]  Per UR Regulation:  Reviewed for med. necessity/level of care/duration of stay  If discussed at [place] of Stay Meetings, dates discussed:    Comments:  02-18-15 Sunday Spillers RN CM 1000 Spoke with patient at bedside. States plans to use [facility] services for [facility] PT. Has all needed DME. Contacted Kristien with [facility] for referral.

## 2015-02-18 NOTE — Progress Notes (Signed)
     Subjective: 1 Day Post-Op Procedure(s) (LRB): LEFT TOTAL KNEE ARTHROPLASTY (Left)   Patient reports pain as mild, pain controlled. No events throughout the night. Ready to be discharged home if he does well with PT and pain stays controlled.   Objective:   VITALS:   Filed Vitals:   02/18/15 0514  BP: 149/67  Pulse: 71  Temp: 98.2 F (36.8 C)  Resp: 16    Dorsiflexion/Plantar flexion intact Incision: dressing C/D/I No cellulitis present Compartment soft  LABS  Recent Labs  02/18/15 0453  HGB 13.8  HCT 41.3  WBC 10.3  PLT 313     Recent Labs  02/18/15 0453  NA 138  K 4.4  BUN 23  CREATININE 1.10  GLUCOSE 278*     Assessment/Plan: 1 Day Post-Op Procedure(s) (LRB): LEFT TOTAL KNEE ARTHROPLASTY (Left) Foley cath d/c'ed  Advance diet Up with therapy D/C IV fluids Discharge home with home health  Follow up in 2 weeks at [facility]. Follow up with OLIN,Kashten Gowin D in 2 weeks.  Contact information:  [facility] [address], [place] [address] [phone]    Morbid Obesity (BMI >40)  Estimated body mass index is 44.05 kg/(m^2) as calculated from the following:   Height as of this encounter: 5' 8.5" (1.74 m).   Weight as of this encounter: 133.358 kg (294 lb). Patient also counseled that weight may inhibit the healing process Patient counseled that losing weight will help with future health issues       [place]. Janijah Symons   PAC  02/18/2015, 8:35 AM

## 2015-02-20 DIAGNOSIS — M150 Primary generalized (osteo)arthritis: Secondary | ICD-10-CM | POA: Diagnosis not present

## 2015-02-20 DIAGNOSIS — E119 Type 2 diabetes mellitus without complications: Secondary | ICD-10-CM | POA: Diagnosis not present

## 2015-02-20 DIAGNOSIS — Z471 Aftercare following joint replacement surgery: Secondary | ICD-10-CM | POA: Diagnosis not present

## 2015-02-20 DIAGNOSIS — I10 Essential (primary) hypertension: Secondary | ICD-10-CM | POA: Diagnosis not present

## 2015-02-20 DIAGNOSIS — Z96652 Presence of left artificial knee joint: Secondary | ICD-10-CM | POA: Diagnosis not present

## 2015-02-20 DIAGNOSIS — Z720 Tobacco use: Secondary | ICD-10-CM | POA: Diagnosis not present

## 2015-02-21 DIAGNOSIS — Z720 Tobacco use: Secondary | ICD-10-CM | POA: Diagnosis not present

## 2015-02-21 DIAGNOSIS — E119 Type 2 diabetes mellitus without complications: Secondary | ICD-10-CM | POA: Diagnosis not present

## 2015-02-21 DIAGNOSIS — Z471 Aftercare following joint replacement surgery: Secondary | ICD-10-CM | POA: Diagnosis not present

## 2015-02-21 DIAGNOSIS — M150 Primary generalized (osteo)arthritis: Secondary | ICD-10-CM | POA: Diagnosis not present

## 2015-02-21 DIAGNOSIS — Z96652 Presence of left artificial knee joint: Secondary | ICD-10-CM | POA: Diagnosis not present

## 2015-02-21 DIAGNOSIS — I10 Essential (primary) hypertension: Secondary | ICD-10-CM | POA: Diagnosis not present

## 2015-02-23 DIAGNOSIS — Z96652 Presence of left artificial knee joint: Secondary | ICD-10-CM | POA: Diagnosis not present

## 2015-02-23 DIAGNOSIS — Z471 Aftercare following joint replacement surgery: Secondary | ICD-10-CM | POA: Diagnosis not present

## 2015-02-23 DIAGNOSIS — I10 Essential (primary) hypertension: Secondary | ICD-10-CM | POA: Diagnosis not present

## 2015-02-23 DIAGNOSIS — M150 Primary generalized (osteo)arthritis: Secondary | ICD-10-CM | POA: Diagnosis not present

## 2015-02-23 DIAGNOSIS — E119 Type 2 diabetes mellitus without complications: Secondary | ICD-10-CM | POA: Diagnosis not present

## 2015-02-23 DIAGNOSIS — Z720 Tobacco use: Secondary | ICD-10-CM | POA: Diagnosis not present

## 2015-02-24 DIAGNOSIS — I10 Essential (primary) hypertension: Secondary | ICD-10-CM | POA: Diagnosis not present

## 2015-02-24 DIAGNOSIS — Z720 Tobacco use: Secondary | ICD-10-CM | POA: Diagnosis not present

## 2015-02-24 DIAGNOSIS — Z96652 Presence of left artificial knee joint: Secondary | ICD-10-CM | POA: Diagnosis not present

## 2015-02-24 DIAGNOSIS — Z471 Aftercare following joint replacement surgery: Secondary | ICD-10-CM | POA: Diagnosis not present

## 2015-02-24 DIAGNOSIS — M150 Primary generalized (osteo)arthritis: Secondary | ICD-10-CM | POA: Diagnosis not present

## 2015-02-24 DIAGNOSIS — E119 Type 2 diabetes mellitus without complications: Secondary | ICD-10-CM | POA: Diagnosis not present

## 2015-02-24 NOTE — Discharge Summary (Signed)
Physician Discharge Summary  Patient ID: Justin Cohen MRN: [id number] DOB/AGE: [birth year] 71 y.o.  Admit date: 02/17/2015 Discharge date: 02/18/2015   Procedures:  Procedure(s) (LRB): LEFT TOTAL KNEE ARTHROPLASTY (Left)  Attending Physician:  Dr. Paralee Cancel   Admission Diagnoses:   Left knee primary OA / pain  Discharge Diagnoses:  Principal Problem:   S/P left TKA Active Problems:   Morbid obesity  Past Medical History  Diagnosis Date  . Diabetes mellitus without complication   . Glaucoma   . Hypertension   . Sinus problem   . Sleep apnea     cpap- setting at 10   . Arthritis   . Cancer     prostate cancer , basal cell skin cancer removed from nose 10/2014     HPI:    Justin Cohen, 69 y.o. male, has a history of pain and functional disability in the left knee due to arthritis and has failed non-surgical conservative treatments for greater than 12 weeks to includeNSAID's and/or analgesics, corticosteriod injections and activity modification. Onset of symptoms was gradual, starting ~! years ago with gradually worsening course since that time. The patient noted prior procedures on the knee to include arthroplasty on the right knee in 2001. Patient currently rates pain in the left knee(s) at 8 out of 10 with activity. Patient has night pain, worsening of pain with activity and weight bearing, pain that interferes with activities of daily living, pain with passive range of motion, crepitus and joint swelling. Patient has evidence of periarticular osteophytes and joint space narrowing by imaging studies. There is no active infection. Risks, benefits and expectations were discussed with the patient. Risks including but not limited to the risk of anesthesia, blood clots, nerve damage, blood vessel damage, failure of the prosthesis, infection and up to and including death. Patient understand the risks, benefits and expectations and wishes to proceed with surgery.   PCP:  Gara Kroner, MD   Discharged Condition: good  Hospital Course:  Patient underwent the above stated procedure on 02/17/2015. Patient tolerated the procedure well and brought to the recovery room in good condition and subsequently to the floor.  POD #1 BP: 149/67 ; Pulse: 71 ; Temp: 98.2 F (36.8 C) ; Resp: 16 Patient reports pain as mild, pain controlled. No events throughout the night. Ready to be discharged home. Dorsiflexion/plantar flexion intact, incision: dressing C/D/I, no cellulitis present and compartment soft.   LABS  Basename    HGB  13.8  HCT  41.3    Discharge Exam: General appearance: alert, cooperative and no distress Extremities: Homans sign is negative, no sign of DVT, no edema, redness or tenderness in the calves or thighs and no ulcers, gangrene or trophic changes  Disposition: Home with follow up in 2 weeks   Follow-up Information    Follow up with [name], MD. Schedule an appointment as soon as possible for a visit in 2 weeks.   Specialty:  Orthopedic Surgery   Contact information:   [address] [place] [address] [phone]       Discharge Instructions    Call MD / Call 911    Complete by:  As directed   If you experience chest pain or shortness of breath, CALL 911 and be transported to the hospital emergency room.  If you develope a fever above 101 F, pus (white drainage) or increased drainage or redness at the wound, or calf pain, call your surgeon's office.     Change  dressing    Complete by:  As directed   Maintain surgical dressing until follow up in the clinic. If the edges start to pull up, may reinforce with tape. If the dressing is no longer working, may remove and cover with gauze and tape, but must keep the area dry and clean.  Call with any questions or concerns.     Constipation Prevention    Complete by:  As directed   Drink plenty of fluids.  Prune juice may be helpful.  You may use a stool softener, such  as Colace (over the counter) 100 mg twice a day.  Use MiraLax (over the counter) for constipation as needed.     Diet - low sodium heart healthy    Complete by:  As directed      Discharge instructions    Complete by:  As directed   Maintain surgical dressing until follow up in the clinic. If the edges start to pull up, may reinforce with tape. If the dressing is no longer working, may remove and cover with gauze and tape, but must keep the area dry and clean.  Follow up in 2 weeks at [facility]. Call with any questions or concerns.     Increase activity slowly as tolerated    Complete by:  As directed      TED hose    Complete by:  As directed   Use stockings (TED hose) for 2 weeks on both leg(s).  You may remove them at night for sleeping.     Weight bearing as tolerated    Complete by:  As directed   Laterality:  left  Extremity:  Lower             Medication List    STOP taking these medications        aspirin 81 MG tablet  Replaced by:  aspirin 325 MG EC tablet      TAKE these medications        amLODipine-benazepril 5-10 MG per capsule  Commonly known as:  LOTREL  Take 1 capsule by mouth every morning.     aspirin 325 MG EC tablet  Take 1 tablet (325 mg total) by mouth 2 (two) times daily.     celecoxib 200 MG capsule  Commonly known as:  CELEBREX  Take 1 capsule (200 mg total) by mouth every 12 (twelve) hours.     cetirizine 10 MG tablet  Commonly known as:  ZYRTEC  Take 10 mg by mouth daily.     docusate sodium 100 MG capsule  Commonly known as:  COLACE  Take 1 capsule (100 mg total) by mouth 2 (two) times daily.     dorzolamide-timolol 22.3-6.8 MG/ML ophthalmic solution  Commonly known as:  COSOPT  Place 1 drop into both eyes 2 (two) times daily.     ferrous sulfate 325 (65 FE) MG tablet  Take 1 tablet (325 mg total) by mouth 3 (three) times daily after meals.     FISH OIL PO  Take 1 capsule by mouth every morning.     fluticasone 50  MCG/ACT nasal spray  Commonly known as:  FLONASE  Place 2 sprays into both nostrils every morning.     glipiZIDE 5 MG tablet  Commonly known as:  GLUCOTROL  Take by mouth daily before breakfast.     HYDROcodone-acetaminophen 7.5-325 MG per tablet  Commonly known as:  NORCO  Take 1-2 tablets by mouth every 4 (four) hours as needed  for moderate pain.     lansoprazole 15 MG capsule  Commonly known as:  PREVACID  Take 15 mg by mouth every morning.     metFORMIN 1000 MG tablet  Commonly known as:  GLUCOPHAGE  Take 1,000 mg by mouth 2 (two) times daily with a meal.     multivitamin tablet  Take 1 tablet by mouth every morning.     pioglitazone 45 MG tablet  Commonly known as:  ACTOS  Take 45 mg by mouth every morning.     polyethylene glycol packet  Commonly known as:  MIRALAX / GLYCOLAX  Take 17 g by mouth 2 (two) times daily.     rosuvastatin 5 MG tablet  Commonly known as:  CRESTOR  Take 5 mg by mouth at bedtime.     tiZANidine 4 MG tablet  Commonly known as:  ZANAFLEX  Take 1 tablet (4 mg total) by mouth every 6 (six) hours as needed for muscle spasms.     venlafaxine XR 75 MG 24 hr capsule  Commonly known as:  EFFEXOR-XR  Take 75 mg by mouth daily with breakfast.     VITAMIN D PO  Take 1 tablet by mouth 2 (two) times daily.         Signed: West Pugh. Jauan Wohl   PA-C  02/24/2015, 1:38 PM

## 2015-02-26 DIAGNOSIS — M150 Primary generalized (osteo)arthritis: Secondary | ICD-10-CM | POA: Diagnosis not present

## 2015-02-26 DIAGNOSIS — E119 Type 2 diabetes mellitus without complications: Secondary | ICD-10-CM | POA: Diagnosis not present

## 2015-02-26 DIAGNOSIS — I10 Essential (primary) hypertension: Secondary | ICD-10-CM | POA: Diagnosis not present

## 2015-02-26 DIAGNOSIS — Z96652 Presence of left artificial knee joint: Secondary | ICD-10-CM | POA: Diagnosis not present

## 2015-02-26 DIAGNOSIS — Z471 Aftercare following joint replacement surgery: Secondary | ICD-10-CM | POA: Diagnosis not present

## 2015-02-26 DIAGNOSIS — Z720 Tobacco use: Secondary | ICD-10-CM | POA: Diagnosis not present

## 2015-02-27 DIAGNOSIS — Z96652 Presence of left artificial knee joint: Secondary | ICD-10-CM | POA: Diagnosis not present

## 2015-02-27 DIAGNOSIS — Z471 Aftercare following joint replacement surgery: Secondary | ICD-10-CM | POA: Diagnosis not present

## 2015-02-27 DIAGNOSIS — E119 Type 2 diabetes mellitus without complications: Secondary | ICD-10-CM | POA: Diagnosis not present

## 2015-02-27 DIAGNOSIS — Z720 Tobacco use: Secondary | ICD-10-CM | POA: Diagnosis not present

## 2015-02-27 DIAGNOSIS — M150 Primary generalized (osteo)arthritis: Secondary | ICD-10-CM | POA: Diagnosis not present

## 2015-02-27 DIAGNOSIS — I10 Essential (primary) hypertension: Secondary | ICD-10-CM | POA: Diagnosis not present

## 2015-03-02 DIAGNOSIS — M150 Primary generalized (osteo)arthritis: Secondary | ICD-10-CM | POA: Diagnosis not present

## 2015-03-02 DIAGNOSIS — G4700 Insomnia, unspecified: Secondary | ICD-10-CM | POA: Diagnosis not present

## 2015-03-02 DIAGNOSIS — M179 Osteoarthritis of knee, unspecified: Secondary | ICD-10-CM | POA: Diagnosis not present

## 2015-03-02 DIAGNOSIS — Z471 Aftercare following joint replacement surgery: Secondary | ICD-10-CM | POA: Diagnosis not present

## 2015-03-02 DIAGNOSIS — I10 Essential (primary) hypertension: Secondary | ICD-10-CM | POA: Diagnosis not present

## 2015-03-02 DIAGNOSIS — Z96652 Presence of left artificial knee joint: Secondary | ICD-10-CM | POA: Diagnosis not present

## 2015-03-02 DIAGNOSIS — Z720 Tobacco use: Secondary | ICD-10-CM | POA: Diagnosis not present

## 2015-03-02 DIAGNOSIS — E119 Type 2 diabetes mellitus without complications: Secondary | ICD-10-CM | POA: Diagnosis not present

## 2015-03-03 DIAGNOSIS — Z471 Aftercare following joint replacement surgery: Secondary | ICD-10-CM | POA: Diagnosis not present

## 2015-03-03 DIAGNOSIS — Z96652 Presence of left artificial knee joint: Secondary | ICD-10-CM | POA: Diagnosis not present

## 2015-03-04 DIAGNOSIS — I10 Essential (primary) hypertension: Secondary | ICD-10-CM | POA: Diagnosis not present

## 2015-03-04 DIAGNOSIS — Z96652 Presence of left artificial knee joint: Secondary | ICD-10-CM | POA: Diagnosis not present

## 2015-03-04 DIAGNOSIS — E119 Type 2 diabetes mellitus without complications: Secondary | ICD-10-CM | POA: Diagnosis not present

## 2015-03-04 DIAGNOSIS — Z720 Tobacco use: Secondary | ICD-10-CM | POA: Diagnosis not present

## 2015-03-04 DIAGNOSIS — M150 Primary generalized (osteo)arthritis: Secondary | ICD-10-CM | POA: Diagnosis not present

## 2015-03-04 DIAGNOSIS — Z471 Aftercare following joint replacement surgery: Secondary | ICD-10-CM | POA: Diagnosis not present

## 2015-03-05 DIAGNOSIS — M150 Primary generalized (osteo)arthritis: Secondary | ICD-10-CM | POA: Diagnosis not present

## 2015-03-05 DIAGNOSIS — Z471 Aftercare following joint replacement surgery: Secondary | ICD-10-CM | POA: Diagnosis not present

## 2015-03-05 DIAGNOSIS — Z96652 Presence of left artificial knee joint: Secondary | ICD-10-CM | POA: Diagnosis not present

## 2015-03-05 DIAGNOSIS — Z720 Tobacco use: Secondary | ICD-10-CM | POA: Diagnosis not present

## 2015-03-05 DIAGNOSIS — I10 Essential (primary) hypertension: Secondary | ICD-10-CM | POA: Diagnosis not present

## 2015-03-05 DIAGNOSIS — E119 Type 2 diabetes mellitus without complications: Secondary | ICD-10-CM | POA: Diagnosis not present

## 2015-03-06 ENCOUNTER — Encounter (HOSPITAL_COMMUNITY): Payer: Self-pay | Admitting: *Deleted

## 2015-03-06 ENCOUNTER — Emergency Department (HOSPITAL_COMMUNITY): Payer: Commercial Managed Care - HMO

## 2015-03-06 ENCOUNTER — Inpatient Hospital Stay (HOSPITAL_COMMUNITY)
Admission: EM | Admit: 2015-03-06 | Discharge: 2015-03-17 | DRG: 853 | Disposition: A | Discharge status: Home Health | Payer: Commercial Managed Care - HMO | Attending: Family Medicine | Admitting: Family Medicine

## 2015-03-06 ENCOUNTER — Other Ambulatory Visit (HOSPITAL_COMMUNITY): Payer: Self-pay | Admitting: Orthopedic Surgery

## 2015-03-06 ENCOUNTER — Encounter (HOSPITAL_COMMUNITY): Payer: Commercial Managed Care - HMO

## 2015-03-06 DIAGNOSIS — R1011 Right upper quadrant pain: Secondary | ICD-10-CM | POA: Diagnosis not present

## 2015-03-06 DIAGNOSIS — G9349 Other encephalopathy: Secondary | ICD-10-CM

## 2015-03-06 DIAGNOSIS — Z79899 Other long term (current) drug therapy: Secondary | ICD-10-CM

## 2015-03-06 DIAGNOSIS — R1084 Generalized abdominal pain: Secondary | ICD-10-CM | POA: Diagnosis not present

## 2015-03-06 DIAGNOSIS — Z6841 Body Mass Index (BMI) 40.0 and over, adult: Secondary | ICD-10-CM

## 2015-03-06 DIAGNOSIS — J9811 Atelectasis: Secondary | ICD-10-CM | POA: Diagnosis not present

## 2015-03-06 DIAGNOSIS — Z96652 Presence of left artificial knee joint: Secondary | ICD-10-CM | POA: Diagnosis not present

## 2015-03-06 DIAGNOSIS — K830 Cholangitis: Secondary | ICD-10-CM

## 2015-03-06 DIAGNOSIS — R945 Abnormal results of liver function studies: Secondary | ICD-10-CM | POA: Diagnosis not present

## 2015-03-06 DIAGNOSIS — G9340 Encephalopathy, unspecified: Secondary | ICD-10-CM | POA: Diagnosis present

## 2015-03-06 DIAGNOSIS — Z87891 Personal history of nicotine dependence: Secondary | ICD-10-CM

## 2015-03-06 DIAGNOSIS — Z8249 Family history of ischemic heart disease and other diseases of the circulatory system: Secondary | ICD-10-CM

## 2015-03-06 DIAGNOSIS — B999 Unspecified infectious disease: Secondary | ICD-10-CM | POA: Diagnosis not present

## 2015-03-06 DIAGNOSIS — R17 Unspecified jaundice: Secondary | ICD-10-CM | POA: Diagnosis not present

## 2015-03-06 DIAGNOSIS — A4151 Sepsis due to Escherichia coli [E. coli]: Secondary | ICD-10-CM | POA: Diagnosis not present

## 2015-03-06 DIAGNOSIS — Z96653 Presence of artificial knee joint, bilateral: Secondary | ICD-10-CM | POA: Diagnosis present

## 2015-03-06 DIAGNOSIS — K8062 Calculus of gallbladder and bile duct with acute cholecystitis without obstruction: Secondary | ICD-10-CM | POA: Diagnosis present

## 2015-03-06 DIAGNOSIS — J189 Pneumonia, unspecified organism: Secondary | ICD-10-CM | POA: Diagnosis not present

## 2015-03-06 DIAGNOSIS — E119 Type 2 diabetes mellitus without complications: Secondary | ICD-10-CM | POA: Diagnosis not present

## 2015-03-06 DIAGNOSIS — A419 Sepsis, unspecified organism: Secondary | ICD-10-CM | POA: Diagnosis present

## 2015-03-06 DIAGNOSIS — K8050 Calculus of bile duct without cholangitis or cholecystitis without obstruction: Secondary | ICD-10-CM | POA: Diagnosis not present

## 2015-03-06 DIAGNOSIS — Z85820 Personal history of malignant melanoma of skin: Secondary | ICD-10-CM

## 2015-03-06 DIAGNOSIS — I10 Essential (primary) hypertension: Secondary | ICD-10-CM | POA: Diagnosis not present

## 2015-03-06 DIAGNOSIS — R000 Tachycardia, unspecified: Secondary | ICD-10-CM | POA: Diagnosis not present

## 2015-03-06 DIAGNOSIS — Z7982 Long term (current) use of aspirin: Secondary | ICD-10-CM

## 2015-03-06 DIAGNOSIS — K6389 Other specified diseases of intestine: Secondary | ICD-10-CM | POA: Diagnosis not present

## 2015-03-06 DIAGNOSIS — K810 Acute cholecystitis: Secondary | ICD-10-CM | POA: Diagnosis not present

## 2015-03-06 DIAGNOSIS — E785 Hyperlipidemia, unspecified: Secondary | ICD-10-CM | POA: Diagnosis not present

## 2015-03-06 DIAGNOSIS — R1013 Epigastric pain: Secondary | ICD-10-CM | POA: Diagnosis not present

## 2015-03-06 DIAGNOSIS — R509 Fever, unspecified: Secondary | ICD-10-CM | POA: Diagnosis not present

## 2015-03-06 DIAGNOSIS — N390 Urinary tract infection, site not specified: Secondary | ICD-10-CM | POA: Insufficient documentation

## 2015-03-06 DIAGNOSIS — K8020 Calculus of gallbladder without cholecystitis without obstruction: Secondary | ICD-10-CM | POA: Diagnosis not present

## 2015-03-06 DIAGNOSIS — G4733 Obstructive sleep apnea (adult) (pediatric): Secondary | ICD-10-CM | POA: Diagnosis present

## 2015-03-06 DIAGNOSIS — M7989 Other specified soft tissue disorders: Principal | ICD-10-CM

## 2015-03-06 DIAGNOSIS — K838 Other specified diseases of biliary tract: Secondary | ICD-10-CM | POA: Diagnosis not present

## 2015-03-06 DIAGNOSIS — J9589 Other postprocedural complications and disorders of respiratory system, not elsewhere classified: Secondary | ICD-10-CM

## 2015-03-06 DIAGNOSIS — R7989 Other specified abnormal findings of blood chemistry: Secondary | ICD-10-CM | POA: Diagnosis present

## 2015-03-06 DIAGNOSIS — K8309 Other cholangitis: Secondary | ICD-10-CM | POA: Diagnosis present

## 2015-03-06 DIAGNOSIS — N281 Cyst of kidney, acquired: Secondary | ICD-10-CM | POA: Diagnosis not present

## 2015-03-06 DIAGNOSIS — Z471 Aftercare following joint replacement surgery: Secondary | ICD-10-CM | POA: Diagnosis not present

## 2015-03-06 DIAGNOSIS — R740 Nonspecific elevation of levels of transaminase and lactic acid dehydrogenase [LDH]: Secondary | ICD-10-CM | POA: Diagnosis not present

## 2015-03-06 DIAGNOSIS — K8030 Calculus of bile duct with cholangitis, unspecified, without obstruction: Secondary | ICD-10-CM | POA: Diagnosis not present

## 2015-03-06 DIAGNOSIS — E669 Obesity, unspecified: Secondary | ICD-10-CM | POA: Diagnosis present

## 2015-03-06 DIAGNOSIS — R9431 Abnormal electrocardiogram [ECG] [EKG]: Secondary | ICD-10-CM | POA: Diagnosis present

## 2015-03-06 DIAGNOSIS — K831 Obstruction of bile duct: Secondary | ICD-10-CM | POA: Diagnosis not present

## 2015-03-06 DIAGNOSIS — Z8546 Personal history of malignant neoplasm of prostate: Secondary | ICD-10-CM

## 2015-03-06 DIAGNOSIS — Z801 Family history of malignant neoplasm of trachea, bronchus and lung: Secondary | ICD-10-CM | POA: Diagnosis not present

## 2015-03-06 DIAGNOSIS — R932 Abnormal findings on diagnostic imaging of liver and biliary tract: Secondary | ICD-10-CM | POA: Diagnosis not present

## 2015-03-06 DIAGNOSIS — M79605 Pain in left leg: Secondary | ICD-10-CM

## 2015-03-06 DIAGNOSIS — K819 Cholecystitis, unspecified: Secondary | ICD-10-CM | POA: Diagnosis not present

## 2015-03-06 DIAGNOSIS — K4020 Bilateral inguinal hernia, without obstruction or gangrene, not specified as recurrent: Secondary | ICD-10-CM | POA: Diagnosis not present

## 2015-03-06 DIAGNOSIS — R4182 Altered mental status, unspecified: Secondary | ICD-10-CM | POA: Diagnosis not present

## 2015-03-06 DIAGNOSIS — K8000 Calculus of gallbladder with acute cholecystitis without obstruction: Secondary | ICD-10-CM | POA: Diagnosis not present

## 2015-03-06 DIAGNOSIS — G4730 Sleep apnea, unspecified: Secondary | ICD-10-CM | POA: Diagnosis present

## 2015-03-06 DIAGNOSIS — Z452 Encounter for adjustment and management of vascular access device: Secondary | ICD-10-CM | POA: Diagnosis not present

## 2015-03-06 DIAGNOSIS — E860 Dehydration: Secondary | ICD-10-CM | POA: Diagnosis not present

## 2015-03-06 DIAGNOSIS — R109 Unspecified abdominal pain: Secondary | ICD-10-CM

## 2015-03-06 HISTORY — DX: Other allergy status, other than to drugs and biological substances: Z91.09

## 2015-03-06 HISTORY — DX: Hyperlipidemia, unspecified: E78.5

## 2015-03-06 LAB — URINE MICROSCOPIC-ADD ON

## 2015-03-06 LAB — I-STAT CG4 LACTIC ACID, ED: LACTIC ACID, VENOUS: 3.81 mmol/L — AB (ref 0.5–2.0)

## 2015-03-06 LAB — COMPREHENSIVE METABOLIC PANEL
ALK PHOS: 309 U/L — AB (ref 39–117)
ALT: 223 U/L — ABNORMAL HIGH (ref 0–53)
AST: 271 U/L — AB (ref 0–37)
Albumin: 3.7 g/dL (ref 3.5–5.2)
Anion gap: 15 (ref 5–15)
BUN: 24 mg/dL — ABNORMAL HIGH (ref 6–23)
CALCIUM: 9.6 mg/dL (ref 8.4–10.5)
CHLORIDE: 98 mmol/L (ref 96–112)
CO2: 21 mmol/L (ref 19–32)
Creatinine, Ser: 1.23 mg/dL (ref 0.50–1.35)
GFR calc Af Amer: 67 mL/min — ABNORMAL LOW (ref >90)
GFR calc non Af Amer: 58 mL/min — ABNORMAL LOW (ref >90)
Glucose, Bld: 165 mg/dL — ABNORMAL HIGH (ref 70–99)
Potassium: 3.6 mmol/L (ref 3.5–5.1)
Sodium: 134 mmol/L — ABNORMAL LOW (ref 135–145)
Total Bilirubin: 7.5 mg/dL — ABNORMAL HIGH (ref 0.3–1.2)
Total Protein: 7.6 g/dL (ref 6.0–8.3)

## 2015-03-06 LAB — CBC WITH DIFFERENTIAL/PLATELET
BASOS ABS: 0 10*3/uL (ref 0.0–0.1)
Basophils Relative: 0 % (ref 0–1)
EOS ABS: 0 10*3/uL (ref 0.0–0.7)
Eosinophils Relative: 0 % (ref 0–5)
HEMATOCRIT: 37.9 % — AB (ref 39.0–52.0)
Hemoglobin: 12.8 g/dL — ABNORMAL LOW (ref 13.0–17.0)
LYMPHS ABS: 0.3 10*3/uL — AB (ref 0.7–4.0)
Lymphocytes Relative: 4 % — ABNORMAL LOW (ref 12–46)
MCH: 29.3 pg (ref 26.0–34.0)
MCHC: 33.8 g/dL (ref 30.0–36.0)
MCV: 86.7 fL (ref 78.0–100.0)
Monocytes Absolute: 0.1 10*3/uL (ref 0.1–1.0)
Monocytes Relative: 1 % — ABNORMAL LOW (ref 3–12)
Neutro Abs: 5.7 10*3/uL (ref 1.7–7.7)
Neutrophils Relative %: 95 % — ABNORMAL HIGH (ref 43–77)
Platelets: 441 10*3/uL — ABNORMAL HIGH (ref 150–400)
RBC: 4.37 MIL/uL (ref 4.22–5.81)
RDW: 14.1 % (ref 11.5–15.5)
WBC: 6.1 10*3/uL (ref 4.0–10.5)

## 2015-03-06 LAB — URINALYSIS, ROUTINE W REFLEX MICROSCOPIC
GLUCOSE, UA: NEGATIVE mg/dL
HGB URINE DIPSTICK: NEGATIVE
KETONES UR: 40 mg/dL — AB
Nitrite: NEGATIVE
PH: 5.5 (ref 5.0–8.0)
Protein, ur: 30 mg/dL — AB
Specific Gravity, Urine: 1.022 (ref 1.005–1.030)
Urobilinogen, UA: 2 mg/dL — ABNORMAL HIGH (ref 0.0–1.0)

## 2015-03-06 LAB — I-STAT TROPONIN, ED: Troponin i, poc: 0.01 ng/mL (ref 0.00–0.08)

## 2015-03-06 MED ORDER — ACETAMINOPHEN 500 MG PO TABS
1000.0000 mg | ORAL_TABLET | Freq: Once | ORAL | Status: AC
  Administered 2015-03-06: 1000 mg via ORAL
  Filled 2015-03-06: qty 2

## 2015-03-06 MED ORDER — DEXTROSE 5 % IV SOLN
1.0000 g | INTRAVENOUS | Status: AC
  Administered 2015-03-06: 1 g via INTRAVENOUS
  Filled 2015-03-06: qty 10

## 2015-03-06 MED ORDER — SODIUM CHLORIDE 0.9 % IV BOLUS (SEPSIS)
2000.0000 mL | Freq: Once | INTRAVENOUS | Status: AC
  Administered 2015-03-06: 2000 mL via INTRAVENOUS

## 2015-03-06 MED ORDER — SODIUM CHLORIDE 0.9 % IV SOLN
500.0000 mg | Freq: Once | INTRAVENOUS | Status: AC
  Administered 2015-03-06: 500 mg via INTRAVENOUS
  Filled 2015-03-06: qty 500

## 2015-03-06 MED ORDER — PIPERACILLIN-TAZOBACTAM 3.375 G IVPB 30 MIN
3.3750 g | Freq: Three times a day (TID) | INTRAVENOUS | Status: DC
  Administered 2015-03-07: 3.375 g via INTRAVENOUS
  Filled 2015-03-06 (×2): qty 50

## 2015-03-06 NOTE — Consult Note (Signed)
Referring Provider: Dr. Darl Householder Primary Care Physician:  No primary care provider on file. Primary Gastroenterologist:  Dr. Watt Climes   Reason for Consultation:  Fever; Elevated LFTs  HPI: Justin Cohen is a 69 y.o. male comes into the ER tonight with fevers, confusion, chills, abdominal pain, and nausea. For the past few days he has been having low grade temps of 99 until today when his temp was 102.5. Persistent nausea that worsened today without vomiting. His wife reports that he has been having right-sided abdominal pain but he does not recall having any pain and she is not sure exactly where the pain was and then states maybe he was having just nausea. +weakness without dizziness. Dark tea-colored urine for months that has also been malodorous. Denies dysuria. Denies melena, hematochezia. Denies joint pain or muscle aches. He had a left knee replacement on 02/17/15 and was scheduled to have what sounds like a LE Doppler today but his wife drove him home due to severe chills and confusion and brought him to the ER when his temp spiked to 102.5. Poor appetite this week. Fever of 103.7 in ER with BP 94/74 and now 108/58. TB 7.5, ALP 309, AST 271, ALT 223. WBC 6.1. U/S shows dilated CBD (1 cm) with sludge. Small echogenic foci in GB with sludge. Wife at bedside and she states that he is normally very sharp mentally but has been confused all day. She did not notice any jaundice or scleral icterus prior to coming to the ER. Dose of Primaxin and Ceftriaxone given in ER.   Past Medical History  Diagnosis Date  . Diabetes mellitus without complication   . Hypertension   . Hyperlipidemia   . Cancer     prostate cancer  . Cancer     melanoma on nose  . Environmental allergies   . Sleep apnea     Past Surgical History  Procedure Laterality Date  . Joint replacement  02/17/15    left knee replacement  . Joint replacement  2001    right knee replacement  . Joint replacement  2012    "right knee cap  replacement"  . Prostate surgery  2009    due to prostate cancer  . Hernia repair  2010  . Melanoma removal  Dec 2015    removed from nose  . Bunionectomy  2014    right foot    Prior to Admission medications   Medication Sig Start Date End Date Taking? Authorizing Provider  amLODipine-benazepril (LOTREL) 5-10 MG per capsule Take 1 capsule by mouth 2 (two) times daily. 01/27/15  Yes Historical Provider, MD  aspirin EC 325 MG tablet Take 325 mg by mouth 2 (two) times daily.   Yes Historical Provider, MD  cholecalciferol (VITAMIN D) 1000 UNITS tablet Take 1,000-2,000 Units by mouth daily. 2000 mg in the am and 1000 mg in the pm   Yes Historical Provider, MD  dorzolamide-timolol (COSOPT) 22.3-6.8 MG/ML ophthalmic solution Place 1 drop into both eyes 2 (two) times daily.  01/16/15  Yes Historical Provider, MD  fluticasone (FLONASE) 50 MCG/ACT nasal spray Place 1 spray into both nostrils every morning.   Yes Historical Provider, MD  glipiZIDE (GLUCOTROL) 5 MG tablet Take 5 mg by mouth every morning. 01/21/15  Yes Historical Provider, MD  HYDROcodone-acetaminophen (NORCO) 7.5-325 MG per tablet Take 1-2 tablets by mouth every 4 (four) hours as needed. For moderate pain 02/18/15  Yes Historical Provider, MD  loratadine (CLARITIN) 10 MG tablet Take 10 mg  by mouth every morning.   Yes Historical Provider, MD  metFORMIN (GLUCOPHAGE) 1000 MG tablet Take 1,000 mg by mouth 2 (two) times daily. 02/10/15  Yes Historical Provider, MD  Multiple Vitamin (MULTIVITAMIN WITH MINERALS) TABS tablet Take 1 tablet by mouth every morning.   Yes Historical Provider, MD  Omega-3 Fatty Acids (FISH OIL) 1000 MG CAPS Take 1,000 mg by mouth every morning.   Yes Historical Provider, MD  pioglitazone (ACTOS) 45 MG tablet Take 45 mg by mouth every morning. 01/27/15  Yes Historical Provider, MD  PREVACID 15 MG capsule Take 15 mg by mouth every morning. 02/25/15  Yes Historical Provider, MD  rosuvastatin (CRESTOR) 5 MG tablet Take 5 mg  by mouth at bedtime.   Yes Historical Provider, MD  tiZANidine (ZANAFLEX) 4 MG tablet Take 4 mg by mouth every 6 (six) hours as needed. Muscle spasms 02/20/15  Yes Historical Provider, MD  venlafaxine XR (EFFEXOR-XR) 75 MG 24 hr capsule Take 75 mg by mouth every morning. 12/16/14  Yes Historical Provider, MD    Scheduled Meds: Continuous Infusions: PRN Meds:.  Allergies as of 03/06/2015  . (No Known Allergies)    History reviewed. No pertinent family history.  History   Social History  . Marital Status: Married    Spouse Name: N/A  . Number of Children: N/A  . Years of Education: N/A   Occupational History  . Not on file.   Social History Main Topics  . Smoking status: Former Smoker    Types: Cigarettes    Quit date: 03/05/1973  . Smokeless tobacco: Never Used  . Alcohol Use: Yes     Comment: occasional wine  . Drug Use: No  . Sexual Activity: Not on file   Other Topics Concern  . Not on file   Social History Narrative  . No narrative on file    Review of Systems: Positive in bold Gen: Denies any fever, chills, rigors, night sweats, anorexia, fatigue, weakness, malaise, involuntary weight loss CV: Denies chest pain, angina, palpitations, syncope Resp: Denies dyspnea, cough GI: see HPI    GU : Denies urinary burning, blood in urine, dark urine, urinary frequency, urinary hesitancy, nocturnal urination MS: Denies joint pains Derm: Denies rash (on back), itching  Psych: Denies depression, anxiety, confusion. Heme: Denies bruising, bleeding Neuro:  Denies any headaches, dizziness Endo:  Denies any problems with DM, thyroid, adrenal function.  Physical Exam: Vital signs in last 24 hours: Temp:  [98.8 F (37.1 C)-103.7 F (39.8 C)] 98.8 F (37.1 C) (04/29 2022) Pulse Rate:  [112-142] 112 (04/29 2030) Resp:  [20-41] 41 (04/29 2030) BP: (94-144)/(49-76) 108/58 mmHg (04/29 2030) SpO2:  [93 %-96 %] 95 % (04/29 2030) Weight:  [129.275 kg (285 lb)] 129.275 kg (285  lb) (04/29 1801)   General:   Lethargic, Obese, pleasant and cooperative in NAD Head:  Normocephalic and atraumatic. Eyes:  Scleral icterus Ears:  Normal auditory acuity. Nose:  No deformity, discharge,  or lesions. Mouth:  No deformity or lesions.  Oropharynx pink & moist. Neck:  Supple; no masses or thyromegaly. Lungs:  Clear throughout to auscultation.   No wheezes, crackles, or rhonchi. No acute distress. Heart:  Regular rate and rhythm; no murmurs, clicks, rubs,  or gallops. Abdomen:  RUQ and epigastric tenderness with guarding (greatest in RUQ), soft, nondistended, +BS Rectal:  Deferred Msk:  Symmetrical without gross deformities. Normal posture. Pulses:  Normal pulses noted. Extremities:  Without clubbing or edema. Neurologic:  Lethargic, Oriented to person and  place but not time Skin: Jaundice   Lab Results:  Recent Labs  03/06/15 1818  WBC 6.1  HGB 12.8*  HCT 37.9*  PLT 441*   BMET  Recent Labs  03/06/15 1818  NA 134*  K 3.6  CL 98  CO2 21  GLUCOSE 165*  BUN 24*  CREATININE 1.23  CALCIUM 9.6     Recent Labs  03/06/15 1818  PROT 7.6  ALBUMIN 3.7  AST 271*  ALT 223*  ALKPHOS 309*  BILITOT 7.5*   PT/INR No results for input(s): LABPROT, INR in the last 72 hours.   Studies/Results: Ct Head Wo Contrast  03/06/2015   CLINICAL DATA:  Altered mental status started today.  Fever.  EXAM: CT HEAD WITHOUT CONTRAST  TECHNIQUE: Contiguous axial images were obtained from the base of the skull through the vertex without intravenous contrast.  COMPARISON:  None.  FINDINGS: There is mild central and cortical atrophy. Periventricular white matter change is consistent with small vessel disease. There is no intra or extra-axial fluid collection or mass lesion. The basilar cisterns and ventricles have a normal appearance. There is no CT evidence for acute infarction or hemorrhage. Bone windows are unremarkable.  IMPRESSION: 1. Atrophy and small vessel disease. 2.  No  evidence for acute intracranial  abnormality.   Electronically Signed   By: Nolon Nations M.D.   On: 03/06/2015 21:52   US Abdomen Complete  03/06/2015   CLINICAL DATA:  Fever right abdominal pain for 2 days concern for cholangitis  EXAM: ULTRASOUND ABDOMEN COMPLETE  COMPARISON:  None.  FINDINGS: Gallbladder: Nonmobile sludge with tiny echogenic foci possibly representing calculi. No wall thickening.  Common bile duct: Diameter: Measures up to about 1 cm. There is echogenic material within the duct.  Liver: Heterogeneous echotexture with no focal abnormalities. Probable fatty infiltration.  IVC: Not seen well  Pancreas: Not seen well  Spleen: Size and appearance within normal limits.  Right Kidney: Length: 13 cm. Echogenicity within normal limits. No mass or hydronephrosis visualized.  Left Kidney: Length: 14 cm. Echogenicity within normal limits. No mass or hydronephrosis visualized.  Abdominal aorta: Obscured by bowel gas  Other findings: None.  IMPRESSION: Dilated common bile duct. Distal obstructing lesion not excluded. Echogenic material within the duct suggesting sludge.  Abundant gallbladder sludge.   Electronically Signed   By: Skipper Cliche M.D.   On: 03/06/2015 21:04   Dg Chest Port 1 View  03/06/2015   CLINICAL DATA:  Two day history of fever  EXAM: PORTABLE CHEST - 1 VIEW  COMPARISON:  None.  FINDINGS: There is patchy infiltrate in the left lower lobe. Lungs elsewhere clear. Heart is upper normal in size with pulmonary vascularity within normal limits. No adenopathy. No bone lesions.  IMPRESSION: Patchy infiltrate left lower lobe.   Electronically Signed   By: Lowella Grip III M.D.   On: 03/06/2015 20:08    Impression/Plan: 69 yo with abdominal pain, jaundice, fevers, and confusion with elevated LFTs concerning for ascending cholangitis. Continue broad spectrum antibiotics (Zosyn), aggressive IVFs, supportive care, bowel rest. ERCP indicated and ERCP will be done tomorrow and I  discussed the risks/benefits of the ERCP with the patient and his wife and they agree to proceed. Check lipase. CT pending. D/W Dr. Deatra Ina who will do the ERCP tomorrow. Hospitalist to admit and would recommend SDU or ICU bed. Will follow in consultation.      Lonoke C.  03/06/2015, 10:28 PM

## 2015-03-06 NOTE — ED Notes (Signed)
Darl Householder EDP made aware of patient CG4 lactic result.

## 2015-03-06 NOTE — Progress Notes (Signed)
EDCM spoke to patient's wife Justin Cohen while patient having bedside ultrasound.  [place].  Patient has a walker,cane, and cpap machine at home.  Patient's wife reports patient had knee replacement on April 12 and was arranged home physical therapy with [facility].  Patient's wife reports tomorrow would be his last day of PT.  Patient's wife reports patient being followed by Dr. Alvan Dame.  Patient's wife reports patient has come into the ED today because of fever, chills and nausea.  Patient wife reports the patient was planning to do outpatient physical therapy at [facility] in [place].  Patient's wife is very pleased with [facility] physical therapist and would like to continue with [facility] if possible.  Patient's wife is also interested in bariatric bedside commode for patient at home.  Patient's wife without further needs or questions at this time.  No further EDCM needs at this time.

## 2015-03-06 NOTE — ED Notes (Signed)
Bed: [facility]44 Expected date:  Expected time:  Means of arrival:  Comments: EMS-fever

## 2015-03-06 NOTE — H&P (Addendum)
PCP: Antony Contras   Referring provider Darl Householder   Chief Complaint: Confusion and fever  HPI: Justin Cohen is a 69 y.o. male   has a past medical history of Diabetes mellitus without complication; Hypertension; Hyperlipidemia; Cancer; Cancer; Environmental allergies; and Sleep apnea.   On April 12 patient was admitted by orthopedics for left total knee replacement which he has undergone without any complications. His other medical history includes diabetes supposedly is well-controlled and high blood pressure. EMS was called today as patient has been febrile 2 days and has been confused. Prior to this he says has some right upper quadrant pain nausea but no vomiting. He was seen by orthopedics today and was sent to get a leg doppler done because the family was worried about that. While waiting he became severely confused and febrile.  On their arrival he was febrile up to 103.7 on arrival to emergency department he was noted to have lactic acid up to 3.8 was initially hypotensive down to 94/74 tachypnea To 41 with heart rate is high as 142 and febrile up to 103.7 meeting sepsis criteria. Patient chest x-ray was whitish himself for left lower lobe infiltrate, UA showed 3-6 white blood cells appeared to be concentrated many bacteria. He denies any dysuria. Patient reports hx of dark urine urine for the past 1 year.  He was noted to have elevated LFTs right upper quadrant ultrasound was done showing dilated common bile duct and sludge although no cholecystitis was noted Hospitalist was called for admission for sepsis due to presumed cholangitis.  Orthopedics was made aware and will see patient in consult.   GI (Dr. Michail Sermon) also has been consulted and will see patient tonight.  In emergency via he was started on imipenem Of note patient denies any cough or shortness of breath no chest pain.  Review of Systems:    Pertinent positives include: Fevers, chills, abdominal pain, nausea, change in color  of urine, confusion  Constitutional:  No weight loss, night sweats,  fatigue, weight loss  HEENT:  No headaches, Difficulty swallowing,Tooth/dental problems,Sore throat,  No sneezing, itching, ear ache, nasal congestion, post nasal drip,  Cardio-vascular:  No chest pain, Orthopnea, PND, anasarca, dizziness, palpitations.no Bilateral lower extremity swelling  GI:  No heartburn, indigestion,  vomiting, diarrhea, change in bowel habits, loss of appetite, melena, blood in stool, hematemesis Resp:  no shortness of breath at rest. No dyspnea on exertion, No excess mucus, no productive cough, No non-productive cough, No coughing up of blood.No change in color of mucus.No wheezing. Skin:  no rash or lesions. No jaundice GU:  no dysuria,  no urgency or frequency. No straining to urinate.  No flank pain.  Musculoskeletal:  No joint pain or no joint swelling. No decreased range of motion. No back pain.  Psych:  No change in mood or affect. No depression or anxiety. No memory loss.  Neuro: no localizing neurological complaints, no tingling, no weakness, no double vision, no gait abnormality, no slurred speech, no   Otherwise ROS are negative except for above, 10 systems were reviewed  Past Medical History: Past Medical History  Diagnosis Date  . Diabetes mellitus without complication   . Hypertension   . Hyperlipidemia   . Cancer     prostate cancer  . Cancer     melanoma on nose  . Environmental allergies   . Sleep apnea    Past Surgical History  Procedure Laterality Date  . Joint replacement  02/17/15  left knee replacement  . Joint replacement  2001    right knee replacement  . Joint replacement  2012    "right knee cap replacement"  . Prostate surgery  2009    due to prostate cancer  . Hernia repair  2010  . Melanoma removal  Dec 2015    removed from nose  . Bunionectomy  2014    right foot     Medications: Prior to Admission medications   Medication Sig Start Date  End Date Taking? Authorizing Provider  amLODipine-benazepril (LOTREL) 5-10 MG per capsule Take 1 capsule by mouth 2 (two) times daily. 01/27/15  Yes Historical Provider, MD  aspirin EC 325 MG tablet Take 325 mg by mouth 2 (two) times daily.   Yes Historical Provider, MD  cholecalciferol (VITAMIN D) 1000 UNITS tablet Take 1,000-2,000 Units by mouth daily. 2000 mg in the am and 1000 mg in the pm   Yes Historical Provider, MD  dorzolamide-timolol (COSOPT) 22.3-6.8 MG/ML ophthalmic solution Place 1 drop into both eyes 2 (two) times daily.  01/16/15  Yes Historical Provider, MD  fluticasone (FLONASE) 50 MCG/ACT nasal spray Place 1 spray into both nostrils every morning.   Yes Historical Provider, MD  glipiZIDE (GLUCOTROL) 5 MG tablet Take 5 mg by mouth every morning. 01/21/15  Yes Historical Provider, MD  HYDROcodone-acetaminophen (NORCO) 7.5-325 MG per tablet Take 1-2 tablets by mouth every 4 (four) hours as needed. For moderate pain 02/18/15  Yes Historical Provider, MD  loratadine (CLARITIN) 10 MG tablet Take 10 mg by mouth every morning.   Yes Historical Provider, MD  metFORMIN (GLUCOPHAGE) 1000 MG tablet Take 1,000 mg by mouth 2 (two) times daily. 02/10/15  Yes Historical Provider, MD  Multiple Vitamin (MULTIVITAMIN WITH MINERALS) TABS tablet Take 1 tablet by mouth every morning.   Yes Historical Provider, MD  Omega-3 Fatty Acids (FISH OIL) 1000 MG CAPS Take 1,000 mg by mouth every morning.   Yes Historical Provider, MD  pioglitazone (ACTOS) 45 MG tablet Take 45 mg by mouth every morning. 01/27/15  Yes Historical Provider, MD  PREVACID 15 MG capsule Take 15 mg by mouth every morning. 02/25/15  Yes Historical Provider, MD  rosuvastatin (CRESTOR) 5 MG tablet Take 5 mg by mouth at bedtime.   Yes Historical Provider, MD  tiZANidine (ZANAFLEX) 4 MG tablet Take 4 mg by mouth every 6 (six) hours as needed. Muscle spasms 02/20/15  Yes Historical Provider, MD  venlafaxine XR (EFFEXOR-XR) 75 MG 24 hr capsule Take 75 mg  by mouth every morning. 12/16/14  Yes Historical Provider, MD    Allergies:  No Known Allergies  Social History:  Ambulatory   independently walker cane, wheelchair bound, bed bound Lives at home With family     reports that he quit smoking about 42 years ago. His smoking use included Cigarettes. He has never used smokeless tobacco. He reports that he drinks alcohol. He reports that he does not use illicit drugs.    Family History: family history is not on file.    Physical Exam: Patient Vitals for the past 24 hrs:  BP Temp Temp src Pulse Resp SpO2 Height Weight  03/06/15 2030 108/58 mmHg - - 112 (!) 41 95 % - -  03/06/15 2022 94/74 mmHg 98.8 F (37.1 C) Oral 112 21 96 % - -  03/06/15 1945 125/76 mmHg - - 119 - 95 % - -  03/06/15 1930 (!) 117/49 mmHg - - (!) 122 - 95 % - -  03/06/15 1915 (!) 111/54 mmHg - - (!) 123 - 94 % - -  03/06/15 1900 141/65 mmHg - - (!) 123 (!) 28 95 % - -  03/06/15 1845 144/68 mmHg - - (!) 121 (!) 33 95 % - -  03/06/15 1830 121/66 mmHg - - (!) 121 20 95 % - -  03/06/15 1801 - - - - - - 5' 8.5" (1.74 m) 129.275 kg (285 lb)  03/06/15 1800 116/60 mmHg - - - (!) 29 - - -  03/06/15 1754 124/59 mmHg (!) 103.7 F (39.8 C) Rectal (!) 142 (!) 30 93 % - -  03/06/15 1753 124/59 mmHg (!) 103.7 F (39.8 C) Rectal (!) 140 (!) 27 94 % - -    1. General:  in No Acute distress 2. Psychological: Alert and but not Oriented 3. Head/ENT:    Dry Mucous Membranes                          Head Non traumatic, neck supple                          Normal  Dentition 4. SKIN:  decreased Skin turgor,  Skin clean Dry and intact no rash, icteric 5. Heart: Regular rate and rhythm no Murmur, Rub or gallop 6. Lungs:   no wheezes occasional crackles   7. Abdomen: Soft, mild RUQ tenderness, Non distended 8. Lower extremities: no clubbing, cyanosis, or edema 9. Neurologically Grossly intact, moving all 4 extremities equally 10. MSK: Normal range of motion  body mass index is 42.7  kg/(m^2).   Labs on Admission:   Results for orders placed or performed during the hospital encounter of 03/06/15 (from the past 24 hour(s))  Urinalysis, Routine w reflex microscopic     Status: Abnormal   Collection Time: 03/06/15  6:00 PM  Result Value Ref Range   Color, Urine ORANGE (A) YELLOW   APPearance CLOUDY (A) CLEAR   Specific Gravity, Urine 1.022 1.005 - 1.030   pH 5.5 5.0 - 8.0   Glucose, UA NEGATIVE NEGATIVE mg/dL   Hgb urine dipstick NEGATIVE NEGATIVE   Bilirubin Urine LARGE (A) NEGATIVE   Ketones, ur 40 (A) NEGATIVE mg/dL   Protein, ur 30 (A) NEGATIVE mg/dL   Urobilinogen, UA 2.0 (H) 0.0 - 1.0 mg/dL   Nitrite NEGATIVE NEGATIVE   Leukocytes, UA SMALL (A) NEGATIVE  Urine microscopic-add on     Status: Abnormal   Collection Time: 03/06/15  6:00 PM  Result Value Ref Range   Squamous Epithelial / LPF RARE RARE   WBC, UA 3-6 <3 WBC/hpf   RBC / HPF 0-2 <3 RBC/hpf   Bacteria, UA MANY (A) RARE   Urine-Other MUCOUS PRESENT   CBC WITH DIFFERENTIAL     Status: Abnormal   Collection Time: 03/06/15  6:18 PM  Result Value Ref Range   WBC 6.1 4.0 - 10.5 K/uL   RBC 4.37 4.22 - 5.81 MIL/uL   Hemoglobin 12.8 (L) 13.0 - 17.0 g/dL   HCT 37.9 (L) 39.0 - 52.0 %   MCV 86.7 78.0 - 100.0 fL   MCH 29.3 26.0 - 34.0 pg   MCHC 33.8 30.0 - 36.0 g/dL   RDW 14.1 11.5 - 15.5 %   Platelets 441 (H) 150 - 400 K/uL   Neutrophils Relative % 95 (H) 43 - 77 %   Neutro Abs 5.7 1.7 - 7.7 K/uL  Lymphocytes Relative 4 (L) 12 - 46 %   Lymphs Abs 0.3 (L) 0.7 - 4.0 K/uL   Monocytes Relative 1 (L) 3 - 12 %   Monocytes Absolute 0.1 0.1 - 1.0 K/uL   Eosinophils Relative 0 0 - 5 %   Eosinophils Absolute 0.0 0.0 - 0.7 K/uL   Basophils Relative 0 0 - 1 %   Basophils Absolute 0.0 0.0 - 0.1 K/uL  Comprehensive metabolic panel     Status: Abnormal   Collection Time: 03/06/15  6:18 PM  Result Value Ref Range   Sodium 134 (L) 135 - 145 mmol/L   Potassium 3.6 3.5 - 5.1 mmol/L   Chloride 98 96 - 112  mmol/L   CO2 21 19 - 32 mmol/L   Glucose, Bld 165 (H) 70 - 99 mg/dL   BUN 24 (H) 6 - 23 mg/dL   Creatinine, Ser 1.23 0.50 - 1.35 mg/dL   Calcium 9.6 8.4 - 10.5 mg/dL   Total Protein 7.6 6.0 - 8.3 g/dL   Albumin 3.7 3.5 - 5.2 g/dL   AST 271 (H) 0 - 37 U/L   ALT 223 (H) 0 - 53 U/L   Alkaline Phosphatase 309 (H) 39 - 117 U/L   Total Bilirubin 7.5 (H) 0.3 - 1.2 mg/dL   GFR calc non Af Amer 58 (L) >90 mL/min   GFR calc Af Amer 67 (L) >90 mL/min   Anion gap 15 5 - 15  I-stat troponin, ED     Status: None   Collection Time: 03/06/15  6:25 PM  Result Value Ref Range   Troponin i, poc 0.01 0.00 - 0.08 ng/mL   Comment 3          I-Stat CG4 Lactic Acid, ED (not at [facility])     Status: Abnormal   Collection Time: 03/06/15  6:27 PM  Result Value Ref Range   Lactic Acid, Venous 3.81 (HH) 0.5 - 2.0 mmol/L   Comment NOTIFIED PHYSICIAN     UA 3-6 white blood cells many bacteria  No results found for: HGBA1C  Estimated Creatinine Clearance: 73.9 mL/min (by C-G formula based on Cr of 1.23).  BNP (last 3 results) No results for input(s): PROBNP in the last 8760 hours.  Other results:  I have pearsonaly reviewed this: ECG REPORT  Rate: 142  Rhythm: Sinus tachycardia ST&T Change: Unspecific ST changes QTC 602  Filed Weights   03/06/15 1801  Weight: 129.275 kg (285 lb)     Cultures: No results found for: SDES, SPECREQUEST, CULT, REPTSTATUS   Radiological Exams on Admission: Ct Head Wo Contrast  03/06/2015   CLINICAL DATA:  Altered mental status started today.  Fever.  EXAM: CT HEAD WITHOUT CONTRAST  TECHNIQUE: Contiguous axial images were obtained from the base of the skull through the vertex without intravenous contrast.  COMPARISON:  None.  FINDINGS: There is mild central and cortical atrophy. Periventricular white matter change is consistent with small vessel disease. There is no intra or extra-axial fluid collection or mass lesion. The basilar cisterns and ventricles have a normal  appearance. There is no CT evidence for acute infarction or hemorrhage. Bone windows are unremarkable.  IMPRESSION: 1. Atrophy and small vessel disease. 2.  No evidence for acute intracranial  abnormality.   Electronically Signed   By: Nolon Nations M.D.   On: 03/06/2015 21:52   US Abdomen Complete  03/06/2015   CLINICAL DATA:  Fever right abdominal pain for 2 days concern for cholangitis  EXAM:  ULTRASOUND ABDOMEN COMPLETE  COMPARISON:  None.  FINDINGS: Gallbladder: Nonmobile sludge with tiny echogenic foci possibly representing calculi. No wall thickening.  Common bile duct: Diameter: Measures up to about 1 cm. There is echogenic material within the duct.  Liver: Heterogeneous echotexture with no focal abnormalities. Probable fatty infiltration.  IVC: Not seen well  Pancreas: Not seen well  Spleen: Size and appearance within normal limits.  Right Kidney: Length: 13 cm. Echogenicity within normal limits. No mass or hydronephrosis visualized.  Left Kidney: Length: 14 cm. Echogenicity within normal limits. No mass or hydronephrosis visualized.  Abdominal aorta: Obscured by bowel gas  Other findings: None.  IMPRESSION: Dilated common bile duct. Distal obstructing lesion not excluded. Echogenic material within the duct suggesting sludge.  Abundant gallbladder sludge.   Electronically Signed   By: Skipper Cliche M.D.   On: 03/06/2015 21:04   Dg Chest Port 1 View  03/06/2015   CLINICAL DATA:  Two day history of fever  EXAM: PORTABLE CHEST - 1 VIEW  COMPARISON:  None.  FINDINGS: There is patchy infiltrate in the left lower lobe. Lungs elsewhere clear. Heart is upper normal in size with pulmonary vascularity within normal limits. No adenopathy. No bone lesions.  IMPRESSION: Patchy infiltrate left lower lobe.   Electronically Signed   By: Lowella Grip III M.D.   On: 03/06/2015 20:08    Chart has been reviewed  Family   at  Bedside  plan of care was discussed with  Wife Debbie [phone]    Assessment/Plan 69 year old male with history of diabetes and hypertension status post recent knee replacement presents with sepsis likely due to pneumonia versus ascending cholangitis versus UTI GI is aware we'll perform ERCP in the morning    Present on Admission:  . Sepsis - cover broadly the IV antibiotics given recent admission and joint replacement. As well as possibility of cholangitis. Will cover with IV Zosyn and vancomycin  . Cholangitis - there is evidence of  ductal dilatation in the setting of fever will treat as cholangitis. He GI is aware we'll perform ERCP pneumonia Will make nothing by mouth . HCAP (healthcare-associated pneumonia) -  - will admit for treatment of HCAP will start on appropriate antibiotic coverage.   Obtain sputum cultures, blood cultures if febrile or if decompensates.  Provide oxygen as needed.  Marland Kitchen UTI (lower urinary tract infection) - await results of urine culture should be covered by Zosyn for now  . HTN (hypertension) hold home medications given low blood pressures . Encephalopathy due to infection currently improving after IV fluid resuscitation . Prolonged QT interval we'll repeat EKG avoid QT prolonging medications . Sleep apnea CPAP at night  Diabetes mellitus will put on sliding scale hold metformin and Actos and glipizide while patient is in p.m. recent had CT contrast Prophylaxis: SCD   CODE STATUS:  FULL CODE   as per patient    Disposition:                           Likely to home once workup is complete and patient is stable   Other plan as per orders.  I have spent a total of 65 min on this admission extra time was taken to discuss case with [facility] hold we will monitor patient through [place] 03/06/2015, 10:37 PM  [facility]  Pager [phone]   after 2 AM please page floor coverage PA If 7AM-7PM, please contact the day  team taking care of the patient  [email or web address]  Password [email or web address]

## 2015-03-06 NOTE — ED Provider Notes (Signed)
CSN: [id number]     Arrival date & time 03/06/15  1746 History   First MD Initiated Contact with Patient 03/06/15 1751     Chief Complaint  Patient presents with  . Fever  . Altered Mental Status     (Consider location/radiation/quality/duration/timing/severity/associated sxs/prior Treatment) The history is provided by the patient.  Justin Cohen is a 69 y.o. male hx of DM, HTN, HL here presenting with fever. He had left knee replacement done 2 weeks ago by Dr. Alvan Dame. He is currently at rehab. The last 2 days he has been having fever, confusion, nonproductive cough, vomiting. Also felt nauseated. States that the left knee has not been more painful and he is able to move the knee.    Past Medical History  Diagnosis Date  . Diabetes mellitus without complication   . Hypertension   . Hyperlipidemia   . Cancer     prostate cancer  . Cancer     melanoma on nose  . Environmental allergies   . Sleep apnea    Past Surgical History  Procedure Laterality Date  . Joint replacement  02/17/15    left knee replacement  . Joint replacement  2001    right knee replacement  . Joint replacement  2012    "right knee cap replacement"  . Prostate surgery  2009    due to prostate cancer  . Hernia repair  2010  . Melanoma removal  Dec 2015    removed from nose  . Bunionectomy  2014    right foot   History reviewed. No pertinent family history. History  Substance Use Topics  . Smoking status: Former Smoker    Types: Cigarettes    Quit date: 03/05/1973  . Smokeless tobacco: Never Used  . Alcohol Use: Yes     Comment: occasional wine    Review of Systems  Constitutional: Positive for fever.  Gastrointestinal: Positive for abdominal pain.  All other systems reviewed and are negative.     Allergies  Review of patient's allergies indicates no known allergies.  Home Medications   Prior to Admission medications   Medication Sig Start Date End Date Taking? Authorizing Provider   amLODipine-benazepril (LOTREL) 5-10 MG per capsule Take 1 capsule by mouth 2 (two) times daily. 01/27/15  Yes Historical Provider, MD  aspirin EC 325 MG tablet Take 325 mg by mouth 2 (two) times daily.   Yes Historical Provider, MD  cholecalciferol (VITAMIN D) 1000 UNITS tablet Take 1,000-2,000 Units by mouth daily. 2000 mg in the am and 1000 mg in the pm   Yes Historical Provider, MD  dorzolamide-timolol (COSOPT) 22.3-6.8 MG/ML ophthalmic solution Place 1 drop into both eyes 2 (two) times daily.  01/16/15  Yes Historical Provider, MD  fluticasone (FLONASE) 50 MCG/ACT nasal spray Place 1 spray into both nostrils every morning.   Yes Historical Provider, MD  glipiZIDE (GLUCOTROL) 5 MG tablet Take 5 mg by mouth every morning. 01/21/15  Yes Historical Provider, MD  HYDROcodone-acetaminophen (NORCO) 7.5-325 MG per tablet Take 1-2 tablets by mouth every 4 (four) hours as needed. For moderate pain 02/18/15  Yes Historical Provider, MD  loratadine (CLARITIN) 10 MG tablet Take 10 mg by mouth every morning.   Yes Historical Provider, MD  metFORMIN (GLUCOPHAGE) 1000 MG tablet Take 1,000 mg by mouth 2 (two) times daily. 02/10/15  Yes Historical Provider, MD  Multiple Vitamin (MULTIVITAMIN WITH MINERALS) TABS tablet Take 1 tablet by mouth every morning.   Yes Historical Provider, MD  Omega-3 Fatty Acids (FISH OIL) 1000 MG CAPS Take 1,000 mg by mouth every morning.   Yes Historical Provider, MD  pioglitazone (ACTOS) 45 MG tablet Take 45 mg by mouth every morning. 01/27/15  Yes Historical Provider, MD  PREVACID 15 MG capsule Take 15 mg by mouth every morning. 02/25/15  Yes Historical Provider, MD  rosuvastatin (CRESTOR) 5 MG tablet Take 5 mg by mouth at bedtime.   Yes Historical Provider, MD  tiZANidine (ZANAFLEX) 4 MG tablet Take 4 mg by mouth every 6 (six) hours as needed. Muscle spasms 02/20/15  Yes Historical Provider, MD  venlafaxine XR (EFFEXOR-XR) 75 MG 24 hr capsule Take 75 mg by mouth every morning. 12/16/14  Yes  Historical Provider, MD   BP 111/54 mmHg  Pulse 123  Temp(Src) 103.7 F (39.8 C) (Rectal)  Resp 28  Ht 5' 8.5" (1.74 m)  Wt 285 lb (129.275 kg)  BMI 42.70 kg/m2  SpO2 94% Physical Exam  Constitutional:  Ill appearing, confused   HENT:  Head: Normocephalic.  MM dry   Eyes: Conjunctivae are normal. Pupils are equal, round, and reactive to light.  Neck: Normal range of motion. Neck supple.  Cardiovascular: Regular rhythm and normal heart sounds.   Tachycardic   Pulmonary/Chest: Effort normal.  Diminished breath sounds bilateral bases   Abdominal: Soft.  Distended, mild epigastric tenderness.   Musculoskeletal: Normal range of motion. He exhibits no edema or tenderness.  L knee replacement, minimal effusion. Not red or warm. Nl ROM   Neurological:  A &O x 1. Confused. Moving all extremities   Skin: Skin is warm and dry.  Psychiatric:  Unable   Nursing note and vitals reviewed.   ED Course  Procedures (including critical care time)  CRITICAL CARE Performed by: Darl Householder, DAVID   Total critical care time: 30 min   Critical care time was exclusive of separately billable procedures and treating other patients.  Critical care was necessary to treat or prevent imminent or life-threatening deterioration.  Critical care was time spent personally by me on the following activities: development of treatment plan with patient and/or surrogate as well as nursing, discussions with consultants, evaluation of patient's response to treatment, examination of patient, obtaining history from patient or surrogate, ordering and performing treatments and interventions, ordering and review of laboratory studies, ordering and review of radiographic studies, pulse oximetry and re-evaluation of patient's condition.   Labs Review Labs Reviewed  CBC WITH DIFFERENTIAL/PLATELET - Abnormal; Notable for the following:    Hemoglobin 12.8 (*)    HCT 37.9 (*)    Platelets 441 (*)    Neutrophils Relative %  95 (*)    Lymphocytes Relative 4 (*)    Lymphs Abs 0.3 (*)    Monocytes Relative 1 (*)    All other components within normal limits  COMPREHENSIVE METABOLIC PANEL - Abnormal; Notable for the following:    Sodium 134 (*)    Glucose, Bld 165 (*)    BUN 24 (*)    AST 271 (*)    ALT 223 (*)    Alkaline Phosphatase 309 (*)    Total Bilirubin 7.5 (*)    GFR calc non Af Amer 58 (*)    GFR calc Af Amer 67 (*)    All other components within normal limits  URINALYSIS, ROUTINE W REFLEX MICROSCOPIC - Abnormal; Notable for the following:    Color, Urine ORANGE (*)    APPearance CLOUDY (*)    Bilirubin Urine LARGE (*)  Ketones, ur 40 (*)    Protein, ur 30 (*)    Urobilinogen, UA 2.0 (*)    Leukocytes, UA SMALL (*)    All other components within normal limits  URINE MICROSCOPIC-ADD ON - Abnormal; Notable for the following:    Bacteria, UA MANY (*)    All other components within normal limits  I-STAT CG4 LACTIC ACID, ED - Abnormal; Notable for the following:    Lactic Acid, Venous 3.81 (*)    All other components within normal limits  CULTURE, BLOOD (ROUTINE X 2)  CULTURE, BLOOD (ROUTINE X 2)  URINE CULTURE  INFLUENZA PANEL BY PCR (TYPE A & B, H1N1)  I-STAT TROPOININ, ED    Imaging Review Dg Chest Port 1 View  03/06/2015   CLINICAL DATA:  Two day history of fever  EXAM: PORTABLE CHEST - 1 VIEW  COMPARISON:  None.  FINDINGS: There is patchy infiltrate in the left lower lobe. Lungs elsewhere clear. Heart is upper normal in size with pulmonary vascularity within normal limits. No adenopathy. No bone lesions.  IMPRESSION: Patchy infiltrate left lower lobe.   Electronically Signed   By: Lowella Grip III M.D.   On: 03/06/2015 20:08     EKG Interpretation   Date/Time:  Friday March 06 2015 17:48:54 EDT Ventricular Rate:  142 PR Interval:  113 QRS Duration: 89 QT Interval:  391 QTC Calculation: 601 R Axis:   69 Text Interpretation:  Sinus tachycardia Anteroseptal infarct, age   indeterminate ST elevation, consider inferior injury Prolonged QT interval  Baseline wander in lead(s) V5 No previous ECGs available Confirmed by YAO   MD, DAVID ([id number]) on 03/06/2015 6:06:35 PM      MDM   Final diagnoses:  Abdominal pain    EEAN BUSS is a 69 y.o. male here with fever. I doubt septic knee. Consider pneumonia vs UTI vs cholangitis vs flu. Code sepsis activated. Will get sepsis workup. Will need admission.   10 pm WBC nl. But LFTs elevated. CXR showed possible pneumonia. Given ceftriaxone initially. Will add imipenem. Will order RUQ [place].  11:56 PM US showed dilated CBD. Called Dr. Michail Sermon who saw patient and plan on ERCP. Will admit to stepdown. Dr. Roel Cluck requested CT ab/pel.      Wandra Arthurs, MD 03/06/15 [phone]

## 2015-03-06 NOTE — ED Notes (Signed)
Per EMS, pt has been running a fever for the past 2 days. EMS states the pt appeared confused upon arrival. Pt's rectal temp is 103.7. Pt has hx of knee surgery 2 weeks ago.

## 2015-03-07 ENCOUNTER — Encounter (HOSPITAL_COMMUNITY)
Admission: EM | Disposition: A | Discharge status: Home Health | Payer: Self-pay | Source: Home / Self Care | Attending: Family Medicine

## 2015-03-07 LAB — URINE CULTURE: Colony Count: 6000

## 2015-03-07 LAB — CBC WITH DIFFERENTIAL/PLATELET
BASOS PCT: 0 % (ref 0–1)
Basophils Absolute: 0 10*3/uL (ref 0.0–0.1)
Eosinophils Absolute: 0 10*3/uL (ref 0.0–0.7)
Eosinophils Relative: 0 % (ref 0–5)
HEMATOCRIT: 33.9 % — AB (ref 39.0–52.0)
HEMOGLOBIN: 11 g/dL — AB (ref 13.0–17.0)
LYMPHS PCT: 5 % — AB (ref 12–46)
Lymphs Abs: 0.6 10*3/uL — ABNORMAL LOW (ref 0.7–4.0)
MCH: 28.2 pg (ref 26.0–34.0)
MCHC: 32.4 g/dL (ref 30.0–36.0)
MCV: 86.9 fL (ref 78.0–100.0)
MONOS PCT: 6 % (ref 3–12)
Monocytes Absolute: 0.8 10*3/uL (ref 0.1–1.0)
NEUTROS ABS: 11.2 10*3/uL — AB (ref 1.7–7.7)
NEUTROS PCT: 89 % — AB (ref 43–77)
Platelets: 401 10*3/uL — ABNORMAL HIGH (ref 150–400)
RBC: 3.9 MIL/uL — AB (ref 4.22–5.81)
RDW: 14.6 % (ref 11.5–15.5)
WBC: 12.6 10*3/uL — AB (ref 4.0–10.5)

## 2015-03-07 LAB — GLUCOSE, CAPILLARY
GLUCOSE-CAPILLARY: 166 mg/dL — AB (ref 70–99)
Glucose-Capillary: 163 mg/dL — ABNORMAL HIGH (ref 70–99)
Glucose-Capillary: 140 mg/dL — ABNORMAL HIGH (ref 70–99)
Glucose-Capillary: 127 mg/dL — ABNORMAL HIGH (ref 70–99)
Glucose-Capillary: 149 mg/dL — ABNORMAL HIGH (ref 70–99)

## 2015-03-07 LAB — PROTIME-INR
INR: 1.26 (ref 0.00–1.49)
PROTHROMBIN TIME: 16 s — AB (ref 11.6–15.2)

## 2015-03-07 LAB — COMPREHENSIVE METABOLIC PANEL
ALT: 205 U/L — ABNORMAL HIGH (ref 0–53)
ANION GAP: 13 (ref 5–15)
AST: 221 U/L — ABNORMAL HIGH (ref 0–37)
Albumin: 3 g/dL — ABNORMAL LOW (ref 3.5–5.2)
Alkaline Phosphatase: 244 U/L — ABNORMAL HIGH (ref 39–117)
BILIRUBIN TOTAL: 7.7 mg/dL — AB (ref 0.3–1.2)
BUN: 31 mg/dL — AB (ref 6–23)
CHLORIDE: 101 mmol/L (ref 96–112)
CO2: 23 mmol/L (ref 19–32)
Calcium: 8.8 mg/dL (ref 8.4–10.5)
Creatinine, Ser: 1.4 mg/dL — ABNORMAL HIGH (ref 0.50–1.35)
GFR calc Af Amer: 57 mL/min — ABNORMAL LOW (ref >90)
GFR calc non Af Amer: 49 mL/min — ABNORMAL LOW (ref >90)
Glucose, Bld: 170 mg/dL — ABNORMAL HIGH (ref 70–99)
Potassium: 4.3 mmol/L (ref 3.5–5.1)
Sodium: 137 mmol/L (ref 135–145)
TOTAL PROTEIN: 6.4 g/dL (ref 6.0–8.3)

## 2015-03-07 LAB — INFLUENZA PANEL BY PCR (TYPE A & B)
H1N1 flu by pcr: NOT DETECTED
Influenza A By PCR: NEGATIVE
Influenza B By PCR: NEGATIVE

## 2015-03-07 LAB — MRSA PCR SCREENING: MRSA BY PCR: NEGATIVE

## 2015-03-07 LAB — LIPASE, BLOOD: LIPASE: 15 U/L (ref 11–59)

## 2015-03-07 LAB — TROPONIN I
TROPONIN I: 0.04 ng/mL — AB (ref <0.031)
Troponin I: 0.04 ng/mL — ABNORMAL HIGH (ref <0.031)
Troponin I: 0.03 ng/mL (ref <0.031)

## 2015-03-07 LAB — PROCALCITONIN: Procalcitonin: 50.79 ng/mL

## 2015-03-07 LAB — STREP PNEUMONIAE URINARY ANTIGEN: STREP PNEUMO URINARY ANTIGEN: NEGATIVE

## 2015-03-07 LAB — LACTIC ACID, PLASMA: LACTIC ACID, VENOUS: 0.6 mmol/L (ref 0.5–2.0)

## 2015-03-07 SURGERY — ERCP, WITH INTERVENTION IF INDICATED
Anesthesia: Moderate Sedation

## 2015-03-07 MED ORDER — CETYLPYRIDINIUM CHLORIDE 0.05 % MT LIQD
7.0000 mL | Freq: Two times a day (BID) | OROMUCOSAL | Status: DC
  Administered 2015-03-07 – 2015-03-17 (×19): 7 mL via OROMUCOSAL

## 2015-03-07 MED ORDER — SODIUM CHLORIDE 0.9 % IV SOLN
INTRAVENOUS | Status: AC
  Administered 2015-03-07: 05:00:00 via INTRAVENOUS

## 2015-03-07 MED ORDER — SODIUM CHLORIDE 0.9 % IV SOLN
INTRAVENOUS | Status: DC
  Administered 2015-03-07 – 2015-03-13 (×9): via INTRAVENOUS
  Filled 2015-03-07 (×21): qty 1000

## 2015-03-07 MED ORDER — PIPERACILLIN-TAZOBACTAM 3.375 G IVPB
3.3750 g | Freq: Three times a day (TID) | INTRAVENOUS | Status: DC
  Administered 2015-03-07 – 2015-03-11 (×13): 3.375 g via INTRAVENOUS
  Filled 2015-03-07 (×24): qty 50

## 2015-03-07 MED ORDER — INSULIN ASPART 100 UNIT/ML ~~LOC~~ SOLN
0.0000 [IU] | SUBCUTANEOUS | Status: DC
  Administered 2015-03-07: 2 [IU] via SUBCUTANEOUS
  Administered 2015-03-07 (×3): 1 [IU] via SUBCUTANEOUS
  Administered 2015-03-07 – 2015-03-08 (×3): 2 [IU] via SUBCUTANEOUS
  Administered 2015-03-08: 3 [IU] via SUBCUTANEOUS
  Administered 2015-03-08 (×3): 2 [IU] via SUBCUTANEOUS
  Administered 2015-03-08: 1 [IU] via SUBCUTANEOUS
  Administered 2015-03-09: 3 [IU] via SUBCUTANEOUS
  Administered 2015-03-09 (×2): 2 [IU] via SUBCUTANEOUS
  Administered 2015-03-09: 3 [IU] via SUBCUTANEOUS
  Administered 2015-03-09 (×2): 2 [IU] via SUBCUTANEOUS
  Administered 2015-03-10: 3 [IU] via SUBCUTANEOUS
  Administered 2015-03-10: 1 [IU] via SUBCUTANEOUS
  Administered 2015-03-10 – 2015-03-12 (×8): 2 [IU] via SUBCUTANEOUS
  Administered 2015-03-12 (×2): 3 [IU] via SUBCUTANEOUS
  Administered 2015-03-12 – 2015-03-13 (×6): 2 [IU] via SUBCUTANEOUS
  Administered 2015-03-14 – 2015-03-15 (×6): 1 [IU] via SUBCUTANEOUS
  Administered 2015-03-15 – 2015-03-16 (×3): 2 [IU] via SUBCUTANEOUS
  Administered 2015-03-16: 1 [IU] via SUBCUTANEOUS
  Administered 2015-03-16 (×3): 2 [IU] via SUBCUTANEOUS
  Administered 2015-03-16 – 2015-03-17 (×2): 1 [IU] via SUBCUTANEOUS
  Administered 2015-03-17: 5 [IU] via SUBCUTANEOUS
  Administered 2015-03-17: 1 [IU] via SUBCUTANEOUS
  Administered 2015-03-17: 2 [IU] via SUBCUTANEOUS

## 2015-03-07 MED ORDER — VANCOMYCIN HCL IN DEXTROSE 1-5 GM/200ML-% IV SOLN: 1000.0000 mg | Freq: Two times a day (BID) | INTRAVENOUS | Status: DC

## 2015-03-07 MED ORDER — VANCOMYCIN HCL 10 G IV SOLR
2500.0000 mg | Freq: Once | INTRAVENOUS | Status: AC
  Administered 2015-03-07: 2500 mg via INTRAVENOUS
  Filled 2015-03-07: qty 2500

## 2015-03-07 MED ORDER — DORZOLAMIDE HCL-TIMOLOL MAL 2-0.5 % OP SOLN
1.0000 [drp] | Freq: Two times a day (BID) | OPHTHALMIC | Status: DC
  Administered 2015-03-07 – 2015-03-17 (×20): 1 [drp] via OPHTHALMIC
  Filled 2015-03-07: qty 10

## 2015-03-07 MED ORDER — FLUTICASONE PROPIONATE 50 MCG/ACT NA SUSP
1.0000 | Freq: Every day | NASAL | Status: DC
  Administered 2015-03-07 – 2015-03-17 (×10): 1 via NASAL
  Filled 2015-03-07: qty 16

## 2015-03-07 MED ORDER — IOHEXOL 300 MG/ML  SOLN
100.0000 mL | Freq: Once | INTRAMUSCULAR | Status: AC | PRN
  Administered 2015-03-07: 100 mL via INTRAVENOUS

## 2015-03-07 MED ORDER — VENLAFAXINE HCL ER 75 MG PO CP24
75.0000 mg | ORAL_CAPSULE | Freq: Every day | ORAL | Status: DC
  Administered 2015-03-07 – 2015-03-17 (×8): 75 mg via ORAL
  Filled 2015-03-07 (×11): qty 1

## 2015-03-07 MED ORDER — SODIUM CHLORIDE 0.9 % IV SOLN: INTRAVENOUS | Status: DC

## 2015-03-07 NOTE — Progress Notes (Signed)
Placed pt on cpap for rest.  Initially started on 12cm h2o per home settings but pt unable to tolerate pressure.  Settings adjusted down gradually to 8cm per pt comfort/preference.  Sterile water added to humidity chamber.  Pt tolerating cpap well at this time.

## 2015-03-07 NOTE — Progress Notes (Signed)
ANTIBIOTIC CONSULT NOTE - INITIAL  Pharmacy Consult for Vancomycin/Zosyn Indication: Sepsis  No Known Allergies  Patient Measurements: Height: 5' 8.5" (174 cm) Weight: 285 lb (129.275 kg) IBW/kg (Calculated) : 69.55   Vital Signs: Temp: 98.4 F (36.9 C) (04/30 0328) Temp Source: Oral (04/30 0328) BP: 130/49 mmHg (04/30 0400) Pulse Rate: 98 (04/30 0400) Intake/Output from previous day:   Intake/Output from this shift:    Labs:  Recent Labs  03/06/15 1818  WBC 6.1  HGB 12.8*  PLT 441*  CREATININE 1.23   Estimated Creatinine Clearance: 73.9 mL/min (by C-G formula based on Cr of 1.23). No results for input(s): VANCOTROUGH, VANCOPEAK, VANCORANDOM, GENTTROUGH, GENTPEAK, GENTRANDOM, TOBRATROUGH, TOBRAPEAK, TOBRARND, AMIKACINPEAK, AMIKACINTROU, AMIKACIN in the last 72 hours.   Microbiology: No results found for this or any previous visit (from the past 720 hour(s)).  Medical History: Past Medical History  Diagnosis Date  . Diabetes mellitus without complication   . Hypertension   . Hyperlipidemia   . Cancer     prostate cancer  . Cancer     melanoma on nose  . Environmental allergies   . Sleep apnea     Medications:  Prescriptions prior to admission  Medication Sig Dispense Refill Last Dose  . amLODipine-benazepril (LOTREL) 5-10 MG per capsule Take 1 capsule by mouth 2 (two) times daily.   03/06/2015 at 0900  . aspirin EC 325 MG tablet Take 325 mg by mouth 2 (two) times daily.   03/06/2015 at 0900  . cholecalciferol (VITAMIN D) 1000 UNITS tablet Take 1,000-2,000 Units by mouth daily. 2000 mg in the am and 1000 mg in the pm   03/06/2015 at 0900  . dorzolamide-timolol (COSOPT) 22.3-6.8 MG/ML ophthalmic solution Place 1 drop into both eyes 2 (two) times daily.    03/05/2015 at Unknown time  . fluticasone (FLONASE) 50 MCG/ACT nasal spray Place 1 spray into both nostrils every morning.   03/06/2015 at 0900  . glipiZIDE (GLUCOTROL) 5 MG tablet Take 5 mg by mouth every  morning.   03/06/2015 at 0900  . HYDROcodone-acetaminophen (NORCO) 7.5-325 MG per tablet Take 1-2 tablets by mouth every 4 (four) hours as needed. For moderate pain   03/05/2015 at 2100  . loratadine (CLARITIN) 10 MG tablet Take 10 mg by mouth every morning.   03/06/2015 at Unknown time  . metFORMIN (GLUCOPHAGE) 1000 MG tablet Take 1,000 mg by mouth 2 (two) times daily.   03/06/2015 at 0900  . Multiple Vitamin (MULTIVITAMIN WITH MINERALS) TABS tablet Take 1 tablet by mouth every morning.   03/06/2015 at Unknown time  . Omega-3 Fatty Acids (FISH OIL) 1000 MG CAPS Take 1,000 mg by mouth every morning.   03/06/2015 at Unknown time  . pioglitazone (ACTOS) 45 MG tablet Take 45 mg by mouth every morning.   03/06/2015 at 0900  . PREVACID 15 MG capsule Take 15 mg by mouth every morning.   03/06/2015 at 0900  . rosuvastatin (CRESTOR) 5 MG tablet Take 5 mg by mouth at bedtime.   03/05/2015 at Unknown time  . tiZANidine (ZANAFLEX) 4 MG tablet Take 4 mg by mouth every 6 (six) hours as needed. Muscle spasms   03/05/2015 at 2100  . venlafaxine XR (EFFEXOR-XR) 75 MG 24 hr capsule Take 75 mg by mouth every morning.   03/06/2015 at 0900   Scheduled:  . sodium chloride   Intravenous STAT  . dorzolamide-timolol  1 drop Both Eyes BID  . fluticasone  1 spray Each Nare Daily  .  insulin aspart  0-9 Units Subcutaneous 6 times per day  . piperacillin-tazobactam  3.375 g Intravenous 3 times per day  . vancomycin  2,500 mg Intravenous Once  . venlafaxine XR  75 mg Oral Daily   Infusions:  . sodium chloride     Assessment: 10 yoM s/p left TKA 4/12 now with fever and confusion.  Vancomycin per Rx for Sepsis.   Goal of Therapy:  Vancomycin trough level 15-20 mcg/ml  Plan:   Vancomycin 2500mg  x1 then 1Gm IV q12h  Zosyn 3.375 Gm IV q8h EI  F/u SCr/cultures/levels as needed  Lawana Pai R 03/07/2015,5:46 AM

## 2015-03-07 NOTE — Progress Notes (Addendum)
Pt complaining that machine was too noisy and pressure was too much.  RT switched Pt to Auto CPAP machine and Pt is tolerating well at this time, current settings are Auto CPAP 4-10 CMH20 with 2 LPM O2 via nasal mask.  RT to monitor and assess as needed.

## 2015-03-07 NOTE — Progress Notes (Signed)
PROGRESS NOTE  Justin Cohen:[id number] DOB: [birth year] DOA: 03/06/2015 PCP: No primary care provider on file.  Brief history This 69 year old male with a history of diabetes mellitus, hypertension, hyperlipidemia, and obstructive sleep apnea presented with 2-3 day history of right upper quadrant and epigastric abdominal pain and fever. The patient was at his orthopedist office on 03/06/2015 when he developed confusion and fever. He was sent to the emergency department. In the emergency department, the patient was noted to be relatively hypotensive with a blood pressure 94/74. He had a fever up to 103.32F. Lactic acid was 3.81. The patient was started on intravenous antibiotics and intravenous fluids. Abdominal ultrasound revealed gallbladder sludge with dilated common bile duct up to 10 mm. CT of the abdomen and pelvis showed cholelithiasis with dilated common bile duct and intrahepatic ductal dilatation. Gastroenterology was consulted, and ERCP is planned. Assessment/Plan: Sepsis -Present at the time of admission -Secondary to cholangitis with possible cholecystitis -Continue intravenous Zosyn and vancomycin -Continue IV fluids -Follow up blood cultures Cholangitis/cholelithiasis -Concern for cholecystitis -ERCP is planned -Appreciate GI consult -Continue intravenous antibiotics -consult general surgery Acute encephalopathy -Improved -Back to baseline -Secondary to infectious process Diabetes mellitus type 2 -NovoLog sliding scale -Hemoglobin A1c -Discontinue metformin Hypertension -Discontinue antihypertensives as the patient was initially septic and relatively hypotensive -Monitor off antihypertensive medications Pulmonary nodules -Noted incidentally on CT in the left lower lobe -Will need outpatient follow-up   Family Communication:   Wife update at beside Disposition Plan:   Home when medically stable       Procedures/Studies: Ct Head Wo  Contrast  03/06/2015   CLINICAL DATA:  Altered mental status started today.  Fever.  EXAM: CT HEAD WITHOUT CONTRAST  TECHNIQUE: Contiguous axial images were obtained from the base of the skull through the vertex without intravenous contrast.  COMPARISON:  None.  FINDINGS: There is mild central and cortical atrophy. Periventricular white matter change is consistent with small vessel disease. There is no intra or extra-axial fluid collection or mass lesion. The basilar cisterns and ventricles have a normal appearance. There is no CT evidence for acute infarction or hemorrhage. Bone windows are unremarkable.  IMPRESSION: 1. Atrophy and small vessel disease. 2.  No evidence for acute intracranial  abnormality.   Electronically Signed   By: Nolon Nations M.D.   On: 03/06/2015 21:52   US Abdomen Complete  03/06/2015   CLINICAL DATA:  Fever right abdominal pain for 2 days concern for cholangitis  EXAM: ULTRASOUND ABDOMEN COMPLETE  COMPARISON:  None.  FINDINGS: Gallbladder: Nonmobile sludge with tiny echogenic foci possibly representing calculi. No wall thickening.  Common bile duct: Diameter: Measures up to about 1 cm. There is echogenic material within the duct.  Liver: Heterogeneous echotexture with no focal abnormalities. Probable fatty infiltration.  IVC: Not seen well  Pancreas: Not seen well  Spleen: Size and appearance within normal limits.  Right Kidney: Length: 13 cm. Echogenicity within normal limits. No mass or hydronephrosis visualized.  Left Kidney: Length: 14 cm. Echogenicity within normal limits. No mass or hydronephrosis visualized.  Abdominal aorta: Obscured by bowel gas  Other findings: None.  IMPRESSION: Dilated common bile duct. Distal obstructing lesion not excluded. Echogenic material within the duct suggesting sludge.  Abundant gallbladder sludge.   Electronically Signed   By: Skipper Cliche M.D.   On: 03/06/2015 21:04   Ct Abdomen Pelvis W Contrast  03/07/2015   CLINICAL DATA:  Acute  onset  of generalized abdominal pain. Initial encounter.  EXAM: CT ABDOMEN AND PELVIS WITH CONTRAST  TECHNIQUE: Multidetector CT imaging of the abdomen and pelvis was performed using the standard protocol following bolus administration of intravenous contrast.  CONTRAST:  171mL OMNIPAQUE IOHEXOL 300 MG/ML  SOLN  COMPARISON:  Abdominal ultrasound performed earlier today at 8:25 p.m.  FINDINGS: Multiple scattered small nodules are noted at the left lower lobe, measuring up to 6 mm in size. Given multiplicity of nodules, these are thought to be postinfectious in nature.  A few stones are seen dependently within the gallbladder. The gallbladder is mildly distended. The common bile duct is dilated to 1.0 cm in diameter, with intrahepatic biliary ductal dilatation, and suggestion of multiple small stones within the distal common bile duct. The liver and spleen are otherwise unremarkable. The pancreas and adrenal glands are unremarkable.  Scattered small right renal cysts are noted. These measure up to 1.4 cm in size. Nonspecific perinephric stranding and fluid are noted bilaterally. The left kidney is otherwise unremarkable. No renal or ureteral stones are identified. There is no evidence of hydronephrosis.  No free fluid is identified. The small bowel is unremarkable in appearance. The stomach is within normal limits. No acute vascular abnormalities are seen. Scattered calcification is seen along the abdominal aorta and its branches.  The appendix is normal in caliber, without evidence of appendicitis. The colon is unremarkable in appearance.  The bladder is decompressed and not well assessed. The patient is status post prostatectomy. Small bilateral inguinal hernias are seen, containing only fat. No inguinal lymphadenopathy is seen.  No acute osseous abnormalities are identified. Multilevel vacuum phenomenon is noted along the lumbar spine. Underlying facet disease is noted.  IMPRESSION: 1. Cholelithiasis, with mild  distention of the gallbladder, dilatation of the common bile duct to 1.0 cm in diameter, and intrahepatic biliary duct dilatation. Suggestion of multiple small stones within the distal common bile duct. Findings raise concern for distal obstruction. ERCP or MRCP could be performed for further evaluation. 2. Scattered small right renal cyst noted. 3. Scattered calcification along the abdominal aorta and its branches. 4. Small bilateral inguinal hernias, containing only fat. 5. Minimal degenerative change along the lumbar spine. 6. Multiple scattered small nodules at the left lower lung lobe, measuring up to 6 mm. Given multiplicity of nodules, these are most likely postinfectious in nature. However, if the patient is at high risk for bronchogenic carcinoma, follow-up chest CT at 6-12 months is recommended. If the patient is at low risk for bronchogenic carcinoma, follow-up chest CT at 12 months is recommended. This recommendation follows the consensus statement: Guidelines for Management of Small Pulmonary Nodules Detected on CT Scans: A Statement from the [employer] as published in Radiology 2005;237:395-400.   Electronically Signed   By: Garald Balding M.D.   On: 03/07/2015 00:43   Dg Chest Port 1 View  03/06/2015   CLINICAL DATA:  Two day history of fever  EXAM: PORTABLE CHEST - 1 VIEW  COMPARISON:  None.  FINDINGS: There is patchy infiltrate in the left lower lobe. Lungs elsewhere clear. Heart is upper normal in size with pulmonary vascularity within normal limits. No adenopathy. No bone lesions.  IMPRESSION: Patchy infiltrate left lower lobe.   Electronically Signed   By: Lowella Grip III M.D.   On: 03/06/2015 20:08         Subjective: Patient is feeling better today. He states that his abdominal pain has improved. Denies any chest pain, shortness breath, coughing,  hemoptysis, vomiting, diarrhea, dysuria, hematochezia. Denies any fevers or chills.  Objective: Filed Vitals:   03/07/15  0453 03/07/15 0510 03/07/15 0530 03/07/15 0600  BP: 115/59 118/57 123/63 129/52  Pulse: 95 94 98 99  Temp:      TempSrc:      Resp: 15 15 25 25   Height:      Weight:      SpO2: 97% 98% 97% 98%    Intake/Output Summary (Last 24 hours) at 03/07/15 0816 Last data filed at 03/07/15 0600  Gross per 24 hour  Intake    690 ml  Output    225 ml  Net    465 ml   Weight change:  Exam:   General:  Pt is alert, follows commands appropriately, not in acute distress  HEENT: No icterus, No thrush,[place]/AT  Cardiovascular: RRR, S1/S2, no rubs, no gallops  Respiratory: CTA bilaterally, no wheezing, no crackles, no rhonchi  Abdomen: Soft/+BS, non tender, non distended, no guarding  Extremities: trace edema, No lymphangitis, No petechiae, No rashes, no synovitis;  left knee incision site without any erythema or drainage  Data Reviewed: Basic Metabolic Panel:  Recent Labs Lab 03/06/15 1818 03/07/15 0514  NA 134* 137  K 3.6 4.3  CL 98 101  CO2 21 23  GLUCOSE 165* 170*  BUN 24* 31*  CREATININE 1.23 1.40*  CALCIUM 9.6 8.8   Liver Function Tests:  Recent Labs Lab 03/06/15 1818 03/07/15 0514  AST 271* 221*  ALT 223* 205*  ALKPHOS 309* 244*  BILITOT 7.5* 7.7*  PROT 7.6 6.4  ALBUMIN 3.7 3.0*    Recent Labs Lab 03/06/15 2354  LIPASE 15   No results for input(s): AMMONIA in the last 168 hours. CBC:  Recent Labs Lab 03/06/15 1818 03/07/15 0514  WBC 6.1 12.6*  NEUTROABS 5.7 11.2*  HGB 12.8* 11.0*  HCT 37.9* 33.9*  MCV 86.7 86.9  PLT 441* 401*   Cardiac Enzymes:  Recent Labs Lab 03/06/15 2354 03/07/15 0514  TROPONINI 0.04* 0.04*   BNP: Invalid input(s): POCBNP CBG:  Recent Labs Lab 03/07/15 0522  GLUCAP 166*    No results found for this or any previous visit (from the past 240 hour(s)).   Scheduled Meds: . sodium chloride   Intravenous STAT  . dorzolamide-timolol  1 drop Both Eyes BID  . fluticasone  1 spray Each Nare Daily  . insulin aspart   0-9 Units Subcutaneous 6 times per day  . piperacillin-tazobactam  3.375 g Intravenous 3 times per day  . vancomycin  1,000 mg Intravenous Q12H  . venlafaxine XR  75 mg Oral Daily   Continuous Infusions: . sodium chloride       Karder Goodin, DO  [facility] Pager [phone]  If 7PM-7AM, please contact night-coverage [email or web address] Password [email or web address] 03/07/2015, 8:16 AM   LOS: 1 day

## 2015-03-07 NOTE — Consult Note (Signed)
Reason for Consult:Sepsis, cholangitis  Referring Physician: Tat  Justin Cohen is an 69 y.o. male.  HPI: I was asked to see this patient for cholelithiasis, choledocholithiasis and cholangitis. He is a couple of weeks status post left total knee replacement. Was doing well until he states he developed the sudden onset of left knee pain. He saw his orthopedist in the office and the joint was aspirated without apparent infection. A Doppler ultrasound of his lower extremities was ordered but as he was waiting for this procedure to be done he developed high fever to 103 and confusion. The patient was admitted and had evidence of early sepsis. LFTs were elevated. Imaging as below is consistent with choledocholithiasis. He is improved somewhat on antibiotics with decreased fever and improved mental status. He denies abdominal pain. He has had lack of appetite and nausea essentially since his knee replacement and has not been eating well. No previous history of significant GI complaints.  Past Medical History  Diagnosis Date  . Diabetes mellitus without complication   . Hypertension   . Hyperlipidemia   . Cancer     prostate cancer  . Cancer     melanoma on nose  . Environmental allergies   . Sleep apnea     Past Surgical History  Procedure Laterality Date  . Joint replacement  02/17/15    left knee replacement  . Joint replacement  2001    right knee replacement  . Joint replacement  2012    "right knee cap replacement"  . Prostate surgery  2009    due to prostate cancer  . Hernia repair  2010  . Melanoma removal  Dec 2015    removed from nose  . Bunionectomy  2014    right foot    Family History  Problem Relation Age of Onset  . Heart failure Mother   . CAD Father   . Lung cancer Sister     Social History:  reports that he quit smoking about 42 years ago. His smoking use included Cigarettes. He has never used smokeless tobacco. He reports that he drinks alcohol. He reports  that he does not use illicit drugs.  Allergies: No Known Allergies  Current Facility-Administered Medications  Medication Dose Route Frequency Provider Last Rate Last Dose  . 0.9 %  sodium chloride infusion   Intravenous STAT Wandra Arthurs, MD 150 mL/hr at 03/07/15 0504    . 0.9 %  sodium chloride infusion   Intravenous Continuous Wilford Corner, MD   0  at 03/07/15 0504  . dorzolamide-timolol (COSOPT) 22.3-6.8 MG/ML ophthalmic solution 1 drop  1 drop Both Eyes BID Toy Baker, MD      . fluticasone (FLONASE) 50 MCG/ACT nasal spray 1 spray  1 spray Each Nare Daily Toy Baker, MD      . insulin aspart (novoLOG) injection 0-9 Units  0-9 Units Subcutaneous 6 times per day Toy Baker, MD   2 Units at 03/07/15 0850  . piperacillin-tazobactam (ZOSYN) IVPB 3.375 g  3.375 g Intravenous 3 times per day Toy Baker, MD   3.375 g at 03/07/15 0504  . sodium chloride 0.9 % 1,000 mL with potassium chloride 20 mEq infusion   Intravenous Continuous Shanon Brow Tat, MD      . vancomycin (VANCOCIN) IVPB 1000 mg/200 mL premix  1,000 mg Intravenous Q12H Dorrene German, RPH      . venlafaxine XR (EFFEXOR-XR) 24 hr capsule 75 mg  75 mg Oral Daily Toy Baker, MD  Results for orders placed or performed during the hospital encounter of 03/06/15 (from the past 48 hour(s))  Urinalysis, Routine w reflex microscopic     Status: Abnormal   Collection Time: 03/06/15  6:00 PM  Result Value Ref Range   Color, Urine ORANGE (A) YELLOW    Comment: BIOCHEMICALS MAY BE AFFECTED BY COLOR   APPearance CLOUDY (A) CLEAR   Specific Gravity, Urine 1.022 1.005 - 1.030   pH 5.5 5.0 - 8.0   Glucose, UA NEGATIVE NEGATIVE mg/dL   Hgb urine dipstick NEGATIVE NEGATIVE   Bilirubin Urine LARGE (A) NEGATIVE   Ketones, ur 40 (A) NEGATIVE mg/dL   Protein, ur 30 (A) NEGATIVE mg/dL   Urobilinogen, UA 2.0 (H) 0.0 - 1.0 mg/dL   Nitrite NEGATIVE NEGATIVE   Leukocytes, UA SMALL (A) NEGATIVE  Urine  microscopic-add on     Status: Abnormal   Collection Time: 03/06/15  6:00 PM  Result Value Ref Range   Squamous Epithelial / LPF RARE RARE   WBC, UA 3-6 <3 WBC/hpf   RBC / HPF 0-2 <3 RBC/hpf   Bacteria, UA MANY (A) RARE   Urine-Other MUCOUS PRESENT   CBC WITH DIFFERENTIAL     Status: Abnormal   Collection Time: 03/06/15  6:18 PM  Result Value Ref Range   WBC 6.1 4.0 - 10.5 K/uL   RBC 4.37 4.22 - 5.81 MIL/uL   Hemoglobin 12.8 (L) 13.0 - 17.0 g/dL   HCT 37.9 (L) 39.0 - 52.0 %   MCV 86.7 78.0 - 100.0 fL   MCH 29.3 26.0 - 34.0 pg   MCHC 33.8 30.0 - 36.0 g/dL   RDW 14.1 11.5 - 15.5 %   Platelets 441 (H) 150 - 400 K/uL   Neutrophils Relative % 95 (H) 43 - 77 %   Neutro Abs 5.7 1.7 - 7.7 K/uL   Lymphocytes Relative 4 (L) 12 - 46 %   Lymphs Abs 0.3 (L) 0.7 - 4.0 K/uL   Monocytes Relative 1 (L) 3 - 12 %   Monocytes Absolute 0.1 0.1 - 1.0 K/uL   Eosinophils Relative 0 0 - 5 %   Eosinophils Absolute 0.0 0.0 - 0.7 K/uL   Basophils Relative 0 0 - 1 %   Basophils Absolute 0.0 0.0 - 0.1 K/uL  Comprehensive metabolic panel     Status: Abnormal   Collection Time: 03/06/15  6:18 PM  Result Value Ref Range   Sodium 134 (L) 135 - 145 mmol/L   Potassium 3.6 3.5 - 5.1 mmol/L   Chloride 98 96 - 112 mmol/L   CO2 21 19 - 32 mmol/L   Glucose, Bld 165 (H) 70 - 99 mg/dL   BUN 24 (H) 6 - 23 mg/dL   Creatinine, Ser 1.23 0.50 - 1.35 mg/dL   Calcium 9.6 8.4 - 10.5 mg/dL   Total Protein 7.6 6.0 - 8.3 g/dL   Albumin 3.7 3.5 - 5.2 g/dL   AST 271 (H) 0 - 37 U/L   ALT 223 (H) 0 - 53 U/L   Alkaline Phosphatase 309 (H) 39 - 117 U/L   Total Bilirubin 7.5 (H) 0.3 - 1.2 mg/dL   GFR calc non Af Amer 58 (L) >90 mL/min   GFR calc Af Amer 67 (L) >90 mL/min    Comment: (NOTE) The eGFR has been calculated using the CKD EPI equation. This calculation has not been validated in all clinical situations. eGFR's persistently <90 mL/min signify possible Chronic Kidney Disease.    Anion gap  15 5 - 15  I-stat  troponin, ED     Status: None   Collection Time: 03/06/15  6:25 PM  Result Value Ref Range   Troponin i, poc 0.01 0.00 - 0.08 ng/mL   Comment 3            Comment: Due to the release kinetics of cTnI, a negative result within the first hours of the onset of symptoms does not rule out myocardial infarction with certainty. If myocardial infarction is still suspected, repeat the test at appropriate intervals.   I-Stat CG4 Lactic Acid, ED (not at [facility])     Status: Abnormal   Collection Time: 03/06/15  6:27 PM  Result Value Ref Range   Lactic Acid, Venous 3.81 (HH) 0.5 - 2.0 mmol/L   Comment NOTIFIED PHYSICIAN   Lipase, blood     Status: None   Collection Time: 03/06/15 11:54 PM  Result Value Ref Range   Lipase 15 11 - 59 U/L  Troponin I (q 6hr x 3)     Status: Abnormal   Collection Time: 03/06/15 11:54 PM  Result Value Ref Range   Troponin I 0.04 (H) <0.031 ng/mL    Comment:        PERSISTENTLY INCREASED TROPONIN VALUES IN THE RANGE OF 0.04-0.49 ng/mL CAN BE SEEN IN:       -UNSTABLE ANGINA       -CONGESTIVE HEART FAILURE       -MYOCARDITIS       -CHEST TRAUMA       -ARRYHTHMIAS       -LATE PRESENTING MYOCARDIAL INFARCTION       -COPD   CLINICAL FOLLOW-UP RECOMMENDED.   Lactic acid, plasma     Status: None   Collection Time: 03/06/15 11:54 PM  Result Value Ref Range   Lactic Acid, Venous 0.6 0.5 - 2.0 mmol/L  Procalcitonin - Baseline     Status: None   Collection Time: 03/06/15 11:54 PM  Result Value Ref Range   Procalcitonin 50.79 ng/mL    Comment:        Interpretation: PCT >= 10 ng/mL: Important systemic inflammatory response, almost exclusively due to severe bacterial sepsis or septic shock. (NOTE)         ICU PCT Algorithm               Non ICU PCT Algorithm    ----------------------------     ------------------------------         PCT < 0.25 ng/mL                 PCT < 0.1 ng/mL     Stopping of antibiotics            Stopping of antibiotics       strongly  encouraged.               strongly encouraged.    ----------------------------     ------------------------------       PCT level decrease by               PCT < 0.25 ng/mL       >= 80% from peak PCT       OR PCT 0.25 - 0.5 ng/mL          Stopping of antibiotics  encouraged.     Stopping of antibiotics           encouraged.    ----------------------------     ------------------------------       PCT level decrease by              PCT >= 0.25 ng/mL       < 80% from peak PCT        AND PCT >= 0.5 ng/mL             Continuing antibiotics                                              encouraged.       Continuing antibiotics            encouraged.    ----------------------------     ------------------------------     PCT level increase compared          PCT > 0.5 ng/mL         with peak PCT AND          PCT >= 0.5 ng/mL             Escalation of antibiotics                                          strongly encouraged.      Escalation of antibiotics        strongly encouraged.   Troponin I (q 6hr x 3)     Status: Abnormal   Collection Time: 03/07/15  5:14 AM  Result Value Ref Range   Troponin I 0.04 (H) <0.031 ng/mL    Comment:        PERSISTENTLY INCREASED TROPONIN VALUES IN THE RANGE OF 0.04-0.49 ng/mL CAN BE SEEN IN:       -UNSTABLE ANGINA       -CONGESTIVE HEART FAILURE       -MYOCARDITIS       -CHEST TRAUMA       -ARRYHTHMIAS       -LATE PRESENTING MYOCARDIAL INFARCTION       -COPD   CLINICAL FOLLOW-UP RECOMMENDED.   Comprehensive metabolic panel     Status: Abnormal   Collection Time: 03/07/15  5:14 AM  Result Value Ref Range   Sodium 137 135 - 145 mmol/L   Potassium 4.3 3.5 - 5.1 mmol/L   Chloride 101 96 - 112 mmol/L   CO2 23 19 - 32 mmol/L   Glucose, Bld 170 (H) 70 - 99 mg/dL   BUN 31 (H) 6 - 23 mg/dL   Creatinine, Ser 1.40 (H) 0.50 - 1.35 mg/dL   Calcium 8.8 8.4 - 10.5 mg/dL   Total Protein 6.4 6.0 - 8.3 g/dL   Albumin  3.0 (L) 3.5 - 5.2 g/dL   AST 221 (H) 0 - 37 U/L   ALT 205 (H) 0 - 53 U/L   Alkaline Phosphatase 244 (H) 39 - 117 U/L   Total Bilirubin 7.7 (H) 0.3 - 1.2 mg/dL   GFR calc non Af Amer 49 (L) >90 mL/min   GFR calc Af Amer 57 (L) >90 mL/min    Comment: (NOTE) The eGFR has been calculated using the CKD EPI equation. This calculation has not been  validated in all clinical situations. eGFR's persistently <90 mL/min signify possible Chronic Kidney Disease.    Anion gap 13 5 - 15  CBC WITH DIFFERENTIAL     Status: Abnormal   Collection Time: 03/07/15  5:14 AM  Result Value Ref Range   WBC 12.6 (H) 4.0 - 10.5 K/uL   RBC 3.90 (L) 4.22 - 5.81 MIL/uL   Hemoglobin 11.0 (L) 13.0 - 17.0 g/dL   HCT 33.9 (L) 39.0 - 52.0 %   MCV 86.9 78.0 - 100.0 fL   MCH 28.2 26.0 - 34.0 pg   MCHC 32.4 30.0 - 36.0 g/dL   RDW 14.6 11.5 - 15.5 %   Platelets 401 (H) 150 - 400 K/uL   Neutrophils Relative % 89 (H) 43 - 77 %   Lymphocytes Relative 5 (L) 12 - 46 %   Monocytes Relative 6 3 - 12 %   Eosinophils Relative 0 0 - 5 %   Basophils Relative 0 0 - 1 %   Neutro Abs 11.2 (H) 1.7 - 7.7 K/uL   Lymphs Abs 0.6 (L) 0.7 - 4.0 K/uL   Monocytes Absolute 0.8 0.1 - 1.0 K/uL   Eosinophils Absolute 0.0 0.0 - 0.7 K/uL   Basophils Absolute 0.0 0.0 - 0.1 K/uL   Smear Review MORPHOLOGY UNREMARKABLE   Protime-INR     Status: Abnormal   Collection Time: 03/07/15  5:14 AM  Result Value Ref Range   Prothrombin Time 16.0 (H) 11.6 - 15.2 seconds   INR 1.26 0.00 - 1.49  Glucose, capillary     Status: Abnormal   Collection Time: 03/07/15  5:22 AM  Result Value Ref Range   Glucose-Capillary 166 (H) 70 - 99 mg/dL    Ct Head Wo Contrast  03/06/2015   CLINICAL DATA:  Altered mental status started today.  Fever.  EXAM: CT HEAD WITHOUT CONTRAST  TECHNIQUE: Contiguous axial images were obtained from the base of the skull through the vertex without intravenous contrast.  COMPARISON:  None.  FINDINGS: There is mild central and cortical  atrophy. Periventricular white matter change is consistent with small vessel disease. There is no intra or extra-axial fluid collection or mass lesion. The basilar cisterns and ventricles have a normal appearance. There is no CT evidence for acute infarction or hemorrhage. Bone windows are unremarkable.  IMPRESSION: 1. Atrophy and small vessel disease. 2.  No evidence for acute intracranial  abnormality.   Electronically Signed   By: Nolon Nations M.D.   On: 03/06/2015 21:52   US Abdomen Complete  03/06/2015   CLINICAL DATA:  Fever right abdominal pain for 2 days concern for cholangitis  EXAM: ULTRASOUND ABDOMEN COMPLETE  COMPARISON:  None.  FINDINGS: Gallbladder: Nonmobile sludge with tiny echogenic foci possibly representing calculi. No wall thickening.  Common bile duct: Diameter: Measures up to about 1 cm. There is echogenic material within the duct.  Liver: Heterogeneous echotexture with no focal abnormalities. Probable fatty infiltration.  IVC: Not seen well  Pancreas: Not seen well  Spleen: Size and appearance within normal limits.  Right Kidney: Length: 13 cm. Echogenicity within normal limits. No mass or hydronephrosis visualized.  Left Kidney: Length: 14 cm. Echogenicity within normal limits. No mass or hydronephrosis visualized.  Abdominal aorta: Obscured by bowel gas  Other findings: None.  IMPRESSION: Dilated common bile duct. Distal obstructing lesion not excluded. Echogenic material within the duct suggesting sludge.  Abundant gallbladder sludge.   Electronically Signed   By: Elodia Florence.D.  On: 03/06/2015 21:04   Ct Abdomen Pelvis W Contrast  03/07/2015   CLINICAL DATA:  Acute onset of generalized abdominal pain. Initial encounter.  EXAM: CT ABDOMEN AND PELVIS WITH CONTRAST  TECHNIQUE: Multidetector CT imaging of the abdomen and pelvis was performed using the standard protocol following bolus administration of intravenous contrast.  CONTRAST:  122m OMNIPAQUE IOHEXOL 300 MG/ML  SOLN   COMPARISON:  Abdominal ultrasound performed earlier today at 8:25 p.m.  FINDINGS: Multiple scattered small nodules are noted at the left lower lobe, measuring up to 6 mm in size. Given multiplicity of nodules, these are thought to be postinfectious in nature.  A few stones are seen dependently within the gallbladder. The gallbladder is mildly distended. The common bile duct is dilated to 1.0 cm in diameter, with intrahepatic biliary ductal dilatation, and suggestion of multiple small stones within the distal common bile duct. The liver and spleen are otherwise unremarkable. The pancreas and adrenal glands are unremarkable.  Scattered small right renal cysts are noted. These measure up to 1.4 cm in size. Nonspecific perinephric stranding and fluid are noted bilaterally. The left kidney is otherwise unremarkable. No renal or ureteral stones are identified. There is no evidence of hydronephrosis.  No free fluid is identified. The small bowel is unremarkable in appearance. The stomach is within normal limits. No acute vascular abnormalities are seen. Scattered calcification is seen along the abdominal aorta and its branches.  The appendix is normal in caliber, without evidence of appendicitis. The colon is unremarkable in appearance.  The bladder is decompressed and not well assessed. The patient is status post prostatectomy. Small bilateral inguinal hernias are seen, containing only fat. No inguinal lymphadenopathy is seen.  No acute osseous abnormalities are identified. Multilevel vacuum phenomenon is noted along the lumbar spine. Underlying facet disease is noted.  IMPRESSION: 1. Cholelithiasis, with mild distention of the gallbladder, dilatation of the common bile duct to 1.0 cm in diameter, and intrahepatic biliary duct dilatation. Suggestion of multiple small stones within the distal common bile duct. Findings raise concern for distal obstruction. ERCP or MRCP could be performed for further evaluation. 2.  Scattered small right renal cyst noted. 3. Scattered calcification along the abdominal aorta and its branches. 4. Small bilateral inguinal hernias, containing only fat. 5. Minimal degenerative change along the lumbar spine. 6. Multiple scattered small nodules at the left lower lung lobe, measuring up to 6 mm. Given multiplicity of nodules, these are most likely postinfectious in nature. However, if the patient is at high risk for bronchogenic carcinoma, follow-up chest CT at 6-12 months is recommended. If the patient is at low risk for bronchogenic carcinoma, follow-up chest CT at 12 months is recommended. This recommendation follows the consensus statement: Guidelines for Management of Small Pulmonary Nodules Detected on CT Scans: A Statement from the [place] published in Radiology 2005;237:395-400.   Electronically Signed   By: JGarald BaldingM.D.   On: 03/07/2015 00:43   Dg Chest Port 1 View  03/06/2015   CLINICAL DATA:  Two day history of fever  EXAM: PORTABLE CHEST - 1 VIEW  COMPARISON:  None.  FINDINGS: There is patchy infiltrate in the left lower lobe. Lungs elsewhere clear. Heart is upper normal in size with pulmonary vascularity within normal limits. No adenopathy. No bone lesions.  IMPRESSION: Patchy infiltrate left lower lobe.   Electronically Signed   By: WLowella GripIII M.D.   On: 03/06/2015 20:08    Review of Systems  Constitutional: Positive  for fever and chills.  Respiratory: Negative for cough, sputum production and shortness of breath.   Cardiovascular: Negative for chest pain and palpitations.  Gastrointestinal: Positive for nausea. Negative for vomiting, abdominal pain, diarrhea and constipation.  Musculoskeletal: Positive for joint pain.   Blood pressure 130/60, pulse 102, temperature 98 F (36.7 C), temperature source Oral, resp. rate 19, height _0  (1.727 m), weight 126.5 kg (278 lb 14.1 oz), SpO2 99 %. Physical Exam General: Alert, Moderately obese  Caucasian male, in no distress Skin: Warm and dry without rash or infection. HEENT: No palpable masses or thyromegaly. Sclera icteric. Pupils equal round and reactive.  Lungs: Breath sounds clear and equal without increased work of breathing Cardiovascular: Regular rate and rhythm without murmur. No JVD or edema. Peripheral pulses intact. Abdomen: Nondistended. Soft Minimal right upper quadrant tenderness to deep palpation.. No masses palpable. No organomegaly. Small reducible umbilical hernia. Extremities: No edema. Healing incision left knee without evidence of infection. No chronic venous stasis changes. Neurologic: Alert and fully oriented.   Assessment/Plan: Admitted with high fever and signs of sepsis with elevated LFTs and imaging by CT scan and ultrasound consistent with common bile duct stones. Apparent cholangitis. He does not have signs of acute cholecystitis. ERCP and duct drainage would be the first intervention and this reportedly is scheduled for later today. He seems improved at this point on antibiotics. Will need cholecystectomy at some point. Will follow.  Verdie Wilms T 03/07/2015, 9:21 AM

## 2015-03-07 NOTE — Progress Notes (Signed)
CRITICAL VALUE ALERT  Critical value received:  Blood cultures drawn 03-06-15 Positive for Gram negative rods  Date of notification:  03-07-2015  Time of notification:  10:20 am  Critical value read back:  yes  Nurse who received alert:  Jerrel Ivory, RN  MD notified (1st page):  Dr. Shanon Brow Tat  Time of first page:  10:27 am  MD notified (2nd page):  Time of second page:  Responding MD:  Dr. Shanon Brow Tat  Time MD responded:

## 2015-03-07 NOTE — Progress Notes (Addendum)
Patient ID: Justin Cohen, male   DOB: June 25, 1946, 69 y.o.   MRN: [id number] [facility] Progress Note  Justin Cohen 69 y.o. [birth year]   Subjective: More alert. Denies abdominal pain.   Objective: Vital signs in last 24 hours: Filed Vitals:   03/07/15 0800  BP: 130/60  Pulse: 102  Temp: 98  Resp: 19    Physical Exam: Gen: alert, no acute distress, obese HEENT: +scleral icterus CV: RRR Chest: CTA anteriorly Abd: RUQ tenderness with minimal guarding, soft, nondistended, +BS Skin: jaundice  Lab Results:  Recent Labs  03/06/15 1818 03/07/15 0514  NA 134* 137  K 3.6 4.3  CL 98 101  CO2 21 23  GLUCOSE 165* 170*  BUN 24* 31*  CREATININE 1.23 1.40*  CALCIUM 9.6 8.8    Recent Labs  03/06/15 1818 03/07/15 0514  AST 271* 221*  ALT 223* 205*  ALKPHOS 309* 244*  BILITOT 7.5* 7.7*  PROT 7.6 6.4  ALBUMIN 3.7 3.0*    Recent Labs  03/06/15 1818 03/07/15 0514  WBC 6.1 12.6*  NEUTROABS 5.7 11.2*  HGB 12.8* 11.0*  HCT 37.9* 33.9*  MCV 86.7 86.9  PLT 441* 401*    Recent Labs  03/07/15 0514  LABPROT 16.0*  INR 1.26   Lipase 15   Assessment/Plan: 69 yo with abdominal pain, jaundice, fevers, and confusion with elevated LFTs concerning for ascending cholangitis. Less confused today and afebrile overnight. BP improved. No significant change in LFTs. IVFs, Abx (Zosyn, Vanco), ERCP by Dr. Deatra Ina (time to be determined). Supportive care. D/W Dr. Deatra Ina and will do ERCP tomorrow and will give clears today and NPO p MN for ERCP on 03/08/15.   [place] C. 03/07/2015, 9:14 AM

## 2015-03-08 ENCOUNTER — Encounter (HOSPITAL_COMMUNITY): Payer: Self-pay | Admitting: *Deleted

## 2015-03-08 ENCOUNTER — Inpatient Hospital Stay (HOSPITAL_COMMUNITY): Payer: Commercial Managed Care - HMO | Admitting: Certified Registered Nurse Anesthetist

## 2015-03-08 ENCOUNTER — Encounter (HOSPITAL_COMMUNITY)
Admission: EM | Disposition: A | Discharge status: Home Health | Payer: Self-pay | Source: Home / Self Care | Attending: Family Medicine

## 2015-03-08 ENCOUNTER — Inpatient Hospital Stay (HOSPITAL_COMMUNITY): Payer: Commercial Managed Care - HMO

## 2015-03-08 DIAGNOSIS — K8050 Calculus of bile duct without cholangitis or cholecystitis without obstruction: Secondary | ICD-10-CM

## 2015-03-08 DIAGNOSIS — I10 Essential (primary) hypertension: Secondary | ICD-10-CM

## 2015-03-08 HISTORY — PX: ERCP: SHX5425

## 2015-03-08 LAB — GLUCOSE, CAPILLARY
GLUCOSE-CAPILLARY: 143 mg/dL — AB (ref 70–99)
GLUCOSE-CAPILLARY: 143 mg/dL — AB (ref 70–99)
GLUCOSE-CAPILLARY: 167 mg/dL — AB (ref 70–99)
GLUCOSE-CAPILLARY: 192 mg/dL — AB (ref 70–99)
Glucose-Capillary: 163 mg/dL — ABNORMAL HIGH (ref 70–99)
Glucose-Capillary: 165 mg/dL — ABNORMAL HIGH (ref 70–99)
Glucose-Capillary: 162 mg/dL — ABNORMAL HIGH (ref 70–99)
Glucose-Capillary: 206 mg/dL — ABNORMAL HIGH (ref 70–99)
Glucose-Capillary: 180 mg/dL — ABNORMAL HIGH (ref 70–99)

## 2015-03-08 LAB — PROCALCITONIN: Procalcitonin: 18.01 ng/mL

## 2015-03-08 SURGERY — ERCP, WITH INTERVENTION IF INDICATED
Anesthesia: General

## 2015-03-08 MED ORDER — ONDANSETRON HCL 4 MG/2ML IJ SOLN
INTRAMUSCULAR | Status: DC | PRN
  Administered 2015-03-08: 4 mg via INTRAVENOUS

## 2015-03-08 MED ORDER — MORPHINE SULFATE 2 MG/ML IJ SOLN
2.0000 mg | INTRAMUSCULAR | Status: DC | PRN
Start: 2015-03-08 — End: 2015-03-12
  Administered 2015-03-08 – 2015-03-10 (×6): 2 mg via INTRAVENOUS
  Filled 2015-03-08 (×7): qty 1

## 2015-03-08 MED ORDER — HEPARIN SODIUM (PORCINE) 5000 UNIT/ML IJ SOLN
5000.0000 [IU] | Freq: Three times a day (TID) | INTRAMUSCULAR | Status: DC
  Administered 2015-03-08 – 2015-03-10 (×6): 5000 [IU] via SUBCUTANEOUS
  Filled 2015-03-08 (×6): qty 1

## 2015-03-08 MED ORDER — ONDANSETRON HCL 4 MG/2ML IJ SOLN
4.0000 mg | Freq: Four times a day (QID) | INTRAMUSCULAR | Status: DC | PRN
  Administered 2015-03-08: 4 mg via INTRAVENOUS
  Filled 2015-03-08 (×2): qty 2

## 2015-03-08 MED ORDER — ONDANSETRON HCL 4 MG/2ML IJ SOLN
INTRAMUSCULAR | Status: AC
  Filled 2015-03-08: qty 2

## 2015-03-08 MED ORDER — POTASSIUM CHLORIDE IN NACL 20-0.9 MEQ/L-% IV SOLN
INTRAVENOUS | Status: AC
  Administered 2015-03-08: 1000 mL
  Filled 2015-03-08: qty 1000

## 2015-03-08 MED ORDER — LIDOCAINE HCL (CARDIAC) 20 MG/ML IV SOLN
INTRAVENOUS | Status: AC
  Filled 2015-03-08: qty 5

## 2015-03-08 MED ORDER — HYDRALAZINE HCL 20 MG/ML IJ SOLN
10.0000 mg | INTRAMUSCULAR | Status: DC | PRN
  Administered 2015-03-11 – 2015-03-16 (×3): 10 mg via INTRAVENOUS
  Filled 2015-03-08 (×3): qty 1

## 2015-03-08 MED ORDER — HYDROMORPHONE HCL 1 MG/ML IJ SOLN
INTRAMUSCULAR | Status: AC
  Filled 2015-03-08: qty 1

## 2015-03-08 MED ORDER — FENTANYL CITRATE (PF) 100 MCG/2ML IJ SOLN
25.0000 ug | INTRAMUSCULAR | Status: DC | PRN
  Administered 2015-03-08 (×3): 50 ug via INTRAVENOUS

## 2015-03-08 MED ORDER — LACTATED RINGERS IV SOLN
INTRAVENOUS | Status: DC | PRN
  Administered 2015-03-08: 13:00:00 via INTRAVENOUS

## 2015-03-08 MED ORDER — INDOMETHACIN 50 MG RE SUPP
100.0000 mg | Freq: Once | RECTAL | Status: DC
  Filled 2015-03-08: qty 2

## 2015-03-08 MED ORDER — SUCCINYLCHOLINE CHLORIDE 20 MG/ML IJ SOLN
INTRAMUSCULAR | Status: DC | PRN
  Administered 2015-03-08: 130 mg via INTRAVENOUS

## 2015-03-08 MED ORDER — HYDRALAZINE HCL 20 MG/ML IJ SOLN
10.0000 mg | Freq: Four times a day (QID) | INTRAMUSCULAR | Status: DC | PRN
  Administered 2015-03-08: 10 mg via INTRAVENOUS
  Filled 2015-03-08: qty 1

## 2015-03-08 MED ORDER — MEPERIDINE HCL 25 MG/ML IJ SOLN: 6.2500 mg | INTRAMUSCULAR | Status: DC | PRN

## 2015-03-08 MED ORDER — PROMETHAZINE HCL 25 MG/ML IJ SOLN: 6.2500 mg | INTRAMUSCULAR | Status: DC | PRN

## 2015-03-08 MED ORDER — LIDOCAINE HCL (CARDIAC) 20 MG/ML IV SOLN
INTRAVENOUS | Status: DC | PRN
  Administered 2015-03-08: 100 mg via INTRAVENOUS

## 2015-03-08 MED ORDER — FENTANYL CITRATE (PF) 100 MCG/2ML IJ SOLN
INTRAMUSCULAR | Status: AC
  Filled 2015-03-08: qty 2

## 2015-03-08 MED ORDER — PHENYLEPHRINE 40 MCG/ML (10ML) SYRINGE FOR IV PUSH (FOR BLOOD PRESSURE SUPPORT)
PREFILLED_SYRINGE | INTRAVENOUS | Status: AC
  Filled 2015-03-08: qty 10

## 2015-03-08 MED ORDER — SODIUM CHLORIDE 0.9 % IV SOLN
INTRAVENOUS | Status: DC | PRN
  Administered 2015-03-08: 20 mL

## 2015-03-08 MED ORDER — PROPOFOL 10 MG/ML IV BOLUS
INTRAVENOUS | Status: AC
  Filled 2015-03-08: qty 20

## 2015-03-08 MED ORDER — HYDRALAZINE HCL 20 MG/ML IJ SOLN
INTRAMUSCULAR | Status: AC
  Filled 2015-03-08: qty 1

## 2015-03-08 MED ORDER — EPHEDRINE SULFATE 50 MG/ML IJ SOLN
INTRAMUSCULAR | Status: AC
  Filled 2015-03-08: qty 1

## 2015-03-08 MED ORDER — HYDRALAZINE HCL 20 MG/ML IJ SOLN
5.0000 mg | Freq: Once | INTRAMUSCULAR | Status: AC
  Administered 2015-03-08: 5 mg via INTRAVENOUS

## 2015-03-08 MED ORDER — PHENYLEPHRINE HCL 10 MG/ML IJ SOLN
INTRAMUSCULAR | Status: DC | PRN
  Administered 2015-03-08: 80 ug via INTRAVENOUS

## 2015-03-08 MED ORDER — FENTANYL CITRATE (PF) 100 MCG/2ML IJ SOLN
INTRAMUSCULAR | Status: DC | PRN
  Administered 2015-03-08: 50 ug via INTRAVENOUS

## 2015-03-08 MED ORDER — PROMETHAZINE HCL 25 MG/ML IJ SOLN
12.5000 mg | Freq: Four times a day (QID) | INTRAMUSCULAR | Status: DC | PRN
  Filled 2015-03-08: qty 1

## 2015-03-08 MED ORDER — SODIUM CHLORIDE 0.9 % IJ SOLN
INTRAMUSCULAR | Status: AC
  Filled 2015-03-08: qty 10

## 2015-03-08 MED ORDER — PROPOFOL 10 MG/ML IV BOLUS
INTRAVENOUS | Status: DC | PRN
  Administered 2015-03-08: 50 mg via INTRAVENOUS
  Administered 2015-03-08: 200 mg via INTRAVENOUS

## 2015-03-08 NOTE — Progress Notes (Signed)
Patient ID: Justin Cohen, male   DOB: 02-May-1946, 69 y.o.   MRN: [id number] Day of [facility]  Subjective: "I'm sick". Complaining of increased mid abdominal pain and nausea this morning  Objective: Vital signs in last 24 hours: Temp:  [98.1 F (36.7 C)-99.2 F (37.3 C)] 99.1 F (37.3 C) (05/01 0353) Pulse Rate:  [101-109] 107 (05/01 0605) Resp:  [15-30] 30 (05/01 0605) BP: (107-206)/(42-94) 174/68 mmHg (05/01 0605) SpO2:  [91 %-99 %] 91 % (05/01 0605) Last BM Date: 03/07/15  Intake/Output from previous day: 04/30 0701 - 05/01 0700 In: 2598.3 [P.O.:360; I.V.:2088.3; IV Piggyback:150] Out: 1845 [Urine:1845] Intake/Output this shift:    General appearance: alert, cooperative and mild distress GI: soft without distention or significant tenderness  Lab Results:   Recent Labs  03/06/15 1818 03/07/15 0514  WBC 6.1 12.6*  HGB 12.8* 11.0*  HCT 37.9* 33.9*  PLT 441* 401*   BMET  Recent Labs  03/06/15 1818 03/07/15 0514  NA 134* 137  K 3.6 4.3  CL 98 101  CO2 21 23  GLUCOSE 165* 170*  BUN 24* 31*  CREATININE 1.23 1.40*  CALCIUM 9.6 8.8   Hepatic Function Latest Ref Rng 03/07/2015 03/06/2015  Total Protein 6.0 - 8.3 g/dL 6.4 7.6  Albumin 3.5 - 5.2 g/dL 3.0(L) 3.7  AST 0 - 37 U/L 221(H) 271(H)  ALT 0 - 53 U/L 205(H) 223(H)  Alk Phosphatase 39 - 117 U/L 244(H) 309(H)  Total Bilirubin 0.3 - 1.2 mg/dL 7.7(H) 7.5(H)   Recent Results (from the past 240 hour(s))  Urine culture     Status: None   Collection Time: 03/06/15  6:00 PM  Result Value Ref Range Status   Specimen Description URINE, CLEAN CATCH  Final   Special Requests NONE  Final   Colony Count   Final    6,000 COLONIES/ML Performed at [facility]    Culture   Final    INSIGNIFICANT GROWTH Performed at [facility]    Report Status 03/07/2015 FINAL  Final  Blood Culture (routine x 2)     Status: None (Preliminary result)   Collection Time: 03/06/15  6:18 PM  Result Value Ref Range  Status   Specimen Description BLOOD LEFT ARM  Final   Special Requests BOTTLES DRAWN AEROBIC AND ANAEROBIC 5CC  Final   Culture   Final    GRAM NEGATIVE RODS Note: Gram Stain Report Called to,Read Back By and Verified With: ANGELA ASHLEY BY INGRAM A 4/30 1020AM Performed at [facility]    Report Status PENDING  Incomplete  Blood Culture (routine x 2)     Status: None (Preliminary result)   Collection Time: 03/06/15  6:19 PM  Result Value Ref Range Status   Specimen Description BLOOD RIGHT ARM  Final   Special Requests BOTTLES DRAWN AEROBIC AND ANAEROBIC 5CC  Final   Culture   Final    GRAM NEGATIVE RODS Note: Gram Stain Report Called to,Read Back By and Verified With: ANGELA ASHLEY BY INGRAM A 4/30 1020AM Performed at [facility]    Report Status PENDING  Incomplete  MRSA PCR Screening     Status: None   Collection Time: 03/07/15  5:49 AM  Result Value Ref Range Status   MRSA by PCR NEGATIVE NEGATIVE Final    Comment:        The GeneXpert MRSA Assay (FDA approved for NASAL specimens only), is one component of a comprehensive MRSA colonization surveillance program. It is not  intended to diagnose MRSA infection nor to guide or monitor treatment for MRSA infections.    Studies/Results: Ct Head Wo Contrast  03/06/2015   CLINICAL DATA:  Altered mental status started today.  Fever.  EXAM: CT HEAD WITHOUT CONTRAST  TECHNIQUE: Contiguous axial images were obtained from the base of the skull through the vertex without intravenous contrast.  COMPARISON:  None.  FINDINGS: There is mild central and cortical atrophy. Periventricular white matter change is consistent with small vessel disease. There is no intra or extra-axial fluid collection or mass lesion. The basilar cisterns and ventricles have a normal appearance. There is no CT evidence for acute infarction or hemorrhage. Bone windows are unremarkable.  IMPRESSION: 1. Atrophy and small vessel disease. 2.  No evidence  for acute intracranial  abnormality.   Electronically Signed   By: Nolon Nations M.D.   On: 03/06/2015 21:52   US Abdomen Complete  03/06/2015   CLINICAL DATA:  Fever right abdominal pain for 2 days concern for cholangitis  EXAM: ULTRASOUND ABDOMEN COMPLETE  COMPARISON:  None.  FINDINGS: Gallbladder: Nonmobile sludge with tiny echogenic foci possibly representing calculi. No wall thickening.  Common bile duct: Diameter: Measures up to about 1 cm. There is echogenic material within the duct.  Liver: Heterogeneous echotexture with no focal abnormalities. Probable fatty infiltration.  IVC: Not seen well  Pancreas: Not seen well  Spleen: Size and appearance within normal limits.  Right Kidney: Length: 13 cm. Echogenicity within normal limits. No mass or hydronephrosis visualized.  Left Kidney: Length: 14 cm. Echogenicity within normal limits. No mass or hydronephrosis visualized.  Abdominal aorta: Obscured by bowel gas  Other findings: None.  IMPRESSION: Dilated common bile duct. Distal obstructing lesion not excluded. Echogenic material within the duct suggesting sludge.  Abundant gallbladder sludge.   Electronically Signed   By: Skipper Cliche M.D.   On: 03/06/2015 21:04   Ct Abdomen Pelvis W Contrast  03/07/2015   CLINICAL DATA:  Acute onset of generalized abdominal pain. Initial encounter.  EXAM: CT ABDOMEN AND PELVIS WITH CONTRAST  TECHNIQUE: Multidetector CT imaging of the abdomen and pelvis was performed using the standard protocol following bolus administration of intravenous contrast.  CONTRAST:  168mL OMNIPAQUE IOHEXOL 300 MG/ML  SOLN  COMPARISON:  Abdominal ultrasound performed earlier today at 8:25 p.m.  FINDINGS: Multiple scattered small nodules are noted at the left lower lobe, measuring up to 6 mm in size. Given multiplicity of nodules, these are thought to be postinfectious in nature.  A few stones are seen dependently within the gallbladder. The gallbladder is mildly distended. The common bile  duct is dilated to 1.0 cm in diameter, with intrahepatic biliary ductal dilatation, and suggestion of multiple small stones within the distal common bile duct. The liver and spleen are otherwise unremarkable. The pancreas and adrenal glands are unremarkable.  Scattered small right renal cysts are noted. These measure up to 1.4 cm in size. Nonspecific perinephric stranding and fluid are noted bilaterally. The left kidney is otherwise unremarkable. No renal or ureteral stones are identified. There is no evidence of hydronephrosis.  No free fluid is identified. The small bowel is unremarkable in appearance. The stomach is within normal limits. No acute vascular abnormalities are seen. Scattered calcification is seen along the abdominal aorta and its branches.  The appendix is normal in caliber, without evidence of appendicitis. The colon is unremarkable in appearance.  The bladder is decompressed and not well assessed. The patient is status post prostatectomy. Small bilateral  inguinal hernias are seen, containing only fat. No inguinal lymphadenopathy is seen.  No acute osseous abnormalities are identified. Multilevel vacuum phenomenon is noted along the lumbar spine. Underlying facet disease is noted.  IMPRESSION: 1. Cholelithiasis, with mild distention of the gallbladder, dilatation of the common bile duct to 1.0 cm in diameter, and intrahepatic biliary duct dilatation. Suggestion of multiple small stones within the distal common bile duct. Findings raise concern for distal obstruction. ERCP or MRCP could be performed for further evaluation. 2. Scattered small right renal cyst noted. 3. Scattered calcification along the abdominal aorta and its branches. 4. Small bilateral inguinal hernias, containing only fat. 5. Minimal degenerative change along the lumbar spine. 6. Multiple scattered small nodules at the left lower lung lobe, measuring up to 6 mm. Given multiplicity of nodules, these are most likely postinfectious  in nature. However, if the patient is at high risk for bronchogenic carcinoma, follow-up chest CT at 6-12 months is recommended. If the patient is at low risk for bronchogenic carcinoma, follow-up chest CT at 12 months is recommended. This recommendation follows the consensus statement: Guidelines for Management of Small Pulmonary Nodules Detected on CT Scans: A Statement from the [employer] as published in Radiology 2005;237:395-400.   Electronically Signed   By: Garald Balding M.D.   On: 03/07/2015 00:43   Dg Chest Port 1 View  03/06/2015   CLINICAL DATA:  Two day history of fever  EXAM: PORTABLE CHEST - 1 VIEW  COMPARISON:  None.  FINDINGS: There is patchy infiltrate in the left lower lobe. Lungs elsewhere clear. Heart is upper normal in size with pulmonary vascularity within normal limits. No adenopathy. No bone lesions.  IMPRESSION: Patchy infiltrate left lower lobe.   Electronically Signed   By: Lowella Grip III M.D.   On: 03/06/2015 20:08    Anti-infectives: Anti-infectives    Start     Dose/Rate Route Frequency Ordered Stop   03/07/15 1800  vancomycin (VANCOCIN) IVPB 1000 mg/200 mL premix  Status:  Discontinued     1,000 mg 200 mL/hr over 60 Minutes Intravenous Every 12 hours 03/07/15 0551 03/07/15 1524   03/07/15 0600  piperacillin-tazobactam (ZOSYN) IVPB 3.375 g     3.375 g 12.5 mL/hr over 240 Minutes Intravenous 3 times per day 03/07/15 0449     03/07/15 0530  vancomycin (VANCOCIN) 2,500 mg in sodium chloride 0.9 % 500 mL IVPB     2,500 mg 250 mL/hr over 120 Minutes Intravenous  Once 03/07/15 0456 03/07/15 0749   03/06/15 2315  piperacillin-tazobactam (ZOSYN) IVPB 3.375 g  Status:  Discontinued     3.375 g 100 mL/hr over 30 Minutes Intravenous 3 times per day 03/06/15 2309 03/07/15 0449   03/06/15 2100  imipenem-cilastatin (PRIMAXIN) 500 mg in sodium chloride 0.9 % 100 mL IVPB     500 mg 200 mL/hr over 30 Minutes Intravenous  Once 03/06/15 2008 03/06/15 2130   03/06/15  1900  cefTRIAXone (ROCEPHIN) 1 g in dextrose 5 % 50 mL IVPB     1 g 100 mL/hr over 30 Minutes Intravenous NOW 03/06/15 1858 03/06/15 1934      Assessment/Plan: Cholelithiasis, choledocholithiasis and cholangitis. Positive blood cultures for gram-negative rods. On Zosyn.  ENDOSCOPIC RETROGRADE CHOLANGIOPANCREATOGRAPHY (ERCP) scheduled for today. Will eventually need cholecystectomy.    LOS: 2 days    Renea Schoonmaker T 03/08/2015

## 2015-03-08 NOTE — H&P (View-Only) (Signed)
Patient ID: MATTHER LABELL, male   DOB: 05-30-1946, 69 y.o.   MRN: [id number] [facility] Gastroenterology Progress Note  YAIDEN YANG 69 y.o. [birth year]   Subjective: More alert. Denies abdominal pain.   Objective: Vital signs in last 24 hours: Filed Vitals:   03/07/15 0800  BP: 130/60  Pulse: 102  Temp: 98  Resp: 19    Physical Exam: Gen: alert, no acute distress, obese HEENT: +scleral icterus CV: RRR Chest: CTA anteriorly Abd: RUQ tenderness with minimal guarding, soft, nondistended, +BS Skin: jaundice  Lab Results:  Recent Labs  03/06/15 1818 03/07/15 0514  NA 134* 137  K 3.6 4.3  CL 98 101  CO2 21 23  GLUCOSE 165* 170*  BUN 24* 31*  CREATININE 1.23 1.40*  CALCIUM 9.6 8.8    Recent Labs  03/06/15 1818 03/07/15 0514  AST 271* 221*  ALT 223* 205*  ALKPHOS 309* 244*  BILITOT 7.5* 7.7*  PROT 7.6 6.4  ALBUMIN 3.7 3.0*    Recent Labs  03/06/15 1818 03/07/15 0514  WBC 6.1 12.6*  NEUTROABS 5.7 11.2*  HGB 12.8* 11.0*  HCT 37.9* 33.9*  MCV 86.7 86.9  PLT 441* 401*    Recent Labs  03/07/15 0514  LABPROT 16.0*  INR 1.26   Lipase 15   Assessment/Plan: 69 yo with abdominal pain, jaundice, fevers, and confusion with elevated LFTs concerning for ascending cholangitis. Less confused today and afebrile overnight. BP improved. No significant change in LFTs. IVFs, Abx (Zosyn, Vanco), ERCP by Dr. Deatra Ina (time to be determined). Supportive care. D/W Dr. Deatra Ina and will do ERCP tomorrow and will give clears today and NPO p MN for ERCP on 03/08/15.   [place] C. 03/07/2015, 9:14 AM

## 2015-03-08 NOTE — Anesthesia Postprocedure Evaluation (Signed)
  Anesthesia Post-op Note  Patient: Justin Cohen  Procedure(s) Performed: Procedure(s): ENDOSCOPIC RETROGRADE CHOLANGIOPANCREATOGRAPHY (ERCP) (N/A)  Patient Location: PACU  Anesthesia Type:General  Level of Consciousness: awake, alert  and oriented  Airway and Oxygen Therapy: Patient Spontanous Breathing  Post-op Pain: none  Post-op Assessment: Post-op Vital signs reviewed, Patient's Cardiovascular Status Stable, Respiratory Function Stable, Patent Airway, No signs of Nausea or vomiting and Pain level controlled  Post-op Vital Signs: Reviewed and stable  Last Vitals:  Filed Vitals:   03/08/15 1355  BP:   Pulse: 98  Temp:   Resp: 26    Complications: No apparent anesthesia complications

## 2015-03-08 NOTE — Anesthesia Procedure Notes (Signed)
Procedure Name: Intubation Date/Time: 03/08/2015 12:50 PM Performed by: Montel Clock Pre-anesthesia Checklist: Patient identified, Emergency Drugs available, Suction available, Patient being monitored and Timeout performed Patient Re-evaluated:Patient Re-evaluated prior to inductionOxygen Delivery Method: Circle system utilized Preoxygenation: Pre-oxygenation with 100% oxygen Intubation Type: IV induction Ventilation: Mask ventilation without difficulty Laryngoscope Size: Mac and 3 Grade View: Grade II Tube type: Oral Tube size: 7.5 mm Number of attempts: 1 Airway Equipment and Method: Stylet Placement Confirmation: ETT inserted through vocal cords under direct vision,  positive ETCO2 and breath sounds checked- equal and bilateral Secured at: 22 cm Tube secured with: Tape Dental Injury: Teeth and Oropharynx as per pre-operative assessment

## 2015-03-08 NOTE — Progress Notes (Signed)
PROGRESS NOTE  Justin Cohen EXB:[id number] DOB: 1946/07/01 DOA: 03/06/2015 PCP: No primary care provider on file.  Brief history This 69 year old male with a history of diabetes mellitus, hypertension, hyperlipidemia, and obstructive sleep apnea presented with 2-3 day history of right upper quadrant and epigastric abdominal pain and fever. The patient was at his orthopedist office on 03/06/2015 when he developed confusion and fever. He was sent to the emergency department. In the emergency department, the patient was noted to be relatively hypotensive with a blood pressure 94/74. He had a fever up to 103.18F. Lactic acid was 3.81. The patient was started on intravenous antibiotics and intravenous fluids. Abdominal ultrasound revealed gallbladder sludge with dilated common bile duct up to 10 mm. CT of the abdomen and pelvis showed cholelithiasis with dilated common bile duct and intrahepatic ductal dilatation. Gastroenterology was consulted, and ERCP is planned for 5/1. Assessment/Plan:  Sepsis -Present at the time of admission, -Secondary to gram-negative rods bacteremia from cholangitis with possible cholecystitis -Continue intravenous Zosyn , vancomycin stopped 4/30. -Blood cultures growing gram-negative rods - Patient with recent left total knee replacement, well monitor closely for signs of infection.  Cholangitis/cholelithiasis -Concern for cholecystitis -ERCP is planned -Appreciate GI consult -Continue intravenous antibiotics -Additional surgical consult appreciated, will eventually need cholecystectomy  Elevated LFTs - Secondary to cholangitis, monitor closely.  Acute encephalopathy -Improved -Back to baseline -Secondary to infectious process  Diabetes mellitus type 2 -CBGs acceptable on NovoLog sliding scale -Hemoglobin A1c -Continue to hold metformin  Hypertension -Initially on hold as patient was hypotensive, and septic  - Currently significantly  elevated, most likely due to pain, will start on when necessary pain medicine, started on when necessary hydralazine.   Pulmonary nodules -Noted incidentally on CT in the left lower lobe -Will need outpatient follow-up  DVT prophylaxis: Subcutaneous heparin  Family Communication:   None at beside Disposition Plan:  Remains in stepdown, home when stable       Procedures/Studies: Ct Head Wo Contrast  03/06/2015   CLINICAL DATA:  Altered mental status started today.  Fever.  EXAM: CT HEAD WITHOUT CONTRAST  TECHNIQUE: Contiguous axial images were obtained from the base of the skull through the vertex without intravenous contrast.  COMPARISON:  None.  FINDINGS: There is mild central and cortical atrophy. Periventricular white matter change is consistent with small vessel disease. There is no intra or extra-axial fluid collection or mass lesion. The basilar cisterns and ventricles have a normal appearance. There is no CT evidence for acute infarction or hemorrhage. Bone windows are unremarkable.  IMPRESSION: 1. Atrophy and small vessel disease. 2.  No evidence for acute intracranial  abnormality.   Electronically Signed   By: Nolon Nations M.D.   On: 03/06/2015 21:52   US Abdomen Complete  03/06/2015   CLINICAL DATA:  Fever right abdominal pain for 2 days concern for cholangitis  EXAM: ULTRASOUND ABDOMEN COMPLETE  COMPARISON:  None.  FINDINGS: Gallbladder: Nonmobile sludge with tiny echogenic foci possibly representing calculi. No wall thickening.  Common bile duct: Diameter: Measures up to about 1 cm. There is echogenic material within the duct.  Liver: Heterogeneous echotexture with no focal abnormalities. Probable fatty infiltration.  IVC: Not seen well  Pancreas: Not seen well  Spleen: Size and appearance within normal limits.  Right Kidney: Length: 13 cm. Echogenicity within normal limits. No mass or hydronephrosis visualized.  Left Kidney: Length: 14 cm. Echogenicity within normal limits.  No mass or  hydronephrosis visualized.  Abdominal aorta: Obscured by bowel gas  Other findings: None.  IMPRESSION: Dilated common bile duct. Distal obstructing lesion not excluded. Echogenic material within the duct suggesting sludge.  Abundant gallbladder sludge.   Electronically Signed   By: Skipper Cliche M.D.   On: 03/06/2015 21:04   Ct Abdomen Pelvis W Contrast  03/07/2015   CLINICAL DATA:  Acute onset of generalized abdominal pain. Initial encounter.  EXAM: CT ABDOMEN AND PELVIS WITH CONTRAST  TECHNIQUE: Multidetector CT imaging of the abdomen and pelvis was performed using the standard protocol following bolus administration of intravenous contrast.  CONTRAST:  148mL OMNIPAQUE IOHEXOL 300 MG/ML  SOLN  COMPARISON:  Abdominal ultrasound performed earlier today at 8:25 p.m.  FINDINGS: Multiple scattered small nodules are noted at the left lower lobe, measuring up to 6 mm in size. Given multiplicity of nodules, these are thought to be postinfectious in nature.  A few stones are seen dependently within the gallbladder. The gallbladder is mildly distended. The common bile duct is dilated to 1.0 cm in diameter, with intrahepatic biliary ductal dilatation, and suggestion of multiple small stones within the distal common bile duct. The liver and spleen are otherwise unremarkable. The pancreas and adrenal glands are unremarkable.  Scattered small right renal cysts are noted. These measure up to 1.4 cm in size. Nonspecific perinephric stranding and fluid are noted bilaterally. The left kidney is otherwise unremarkable. No renal or ureteral stones are identified. There is no evidence of hydronephrosis.  No free fluid is identified. The small bowel is unremarkable in appearance. The stomach is within normal limits. No acute vascular abnormalities are seen. Scattered calcification is seen along the abdominal aorta and its branches.  The appendix is normal in caliber, without evidence of appendicitis. The colon is  unremarkable in appearance.  The bladder is decompressed and not well assessed. The patient is status post prostatectomy. Small bilateral inguinal hernias are seen, containing only fat. No inguinal lymphadenopathy is seen.  No acute osseous abnormalities are identified. Multilevel vacuum phenomenon is noted along the lumbar spine. Underlying facet disease is noted.  IMPRESSION: 1. Cholelithiasis, with mild distention of the gallbladder, dilatation of the common bile duct to 1.0 cm in diameter, and intrahepatic biliary duct dilatation. Suggestion of multiple small stones within the distal common bile duct. Findings raise concern for distal obstruction. ERCP or MRCP could be performed for further evaluation. 2. Scattered small right renal cyst noted. 3. Scattered calcification along the abdominal aorta and its branches. 4. Small bilateral inguinal hernias, containing only fat. 5. Minimal degenerative change along the lumbar spine. 6. Multiple scattered small nodules at the left lower lung lobe, measuring up to 6 mm. Given multiplicity of nodules, these are most likely postinfectious in nature. However, if the patient is at high risk for bronchogenic carcinoma, follow-up chest CT at 6-12 months is recommended. If the patient is at low risk for bronchogenic carcinoma, follow-up chest CT at 12 months is recommended. This recommendation follows the consensus statement: Guidelines for Management of Small Pulmonary Nodules Detected on CT Scans: A Statement from the [place] as published in Radiology 2005;237:395-400.   Electronically Signed   By: Garald Balding M.D.   On: 03/07/2015 00:43   Dg Chest Port 1 View  03/06/2015   CLINICAL DATA:  Two day history of fever  EXAM: PORTABLE CHEST - 1 VIEW  COMPARISON:  None.  FINDINGS: There is patchy infiltrate in the left lower lobe. Lungs elsewhere clear. Heart is upper  normal in size with pulmonary vascularity within normal limits. No adenopathy. No bone lesions.   IMPRESSION: Patchy infiltrate left lower lobe.   Electronically Signed   By: Lowella Grip III M.D.   On: 03/06/2015 20:08        Subjective: Still complaining of abdominal pain. Denies any chest pain, shortness breath, coughing, hemoptysis, vomiting, diarrhea, dysuria, hematochezia. Denies any fevers or chills.  Objective: Filed Vitals:   03/08/15 0353 03/08/15 0428 03/08/15 0605 03/08/15 0755  BP: 194/83 206/94 174/68   Pulse:  106 107   Temp: 99.1 F (37.3 C)   98.1 F (36.7 C)  TempSrc: Oral   Oral  Resp:  21 30   Height:      Weight:      SpO2:  93% 91%     Intake/Output Summary (Last 24 hours) at 03/08/15 1145 Last data filed at 03/08/15 0815  Gross per 24 hour  Intake   1770 ml  Output   2070 ml  Net   -300 ml   Weight change:  Exam:   General:  Pt is alert, follows commands appropriately, not in acute distress  HEENT: No icterus, No thrush,[place]/AT  Cardiovascular: RRR, S1/S2, no rubs, no gallops  Respiratory: CTA bilaterally, no wheezing, no crackles, no rhonchi  Abdomen: Soft/+BS, non tender, non distended, no guarding  Extremities: trace edema, No lymphangitis, No petechiae, No rashes, no synovitis;  left knee incision site without any erythema or drainage  Data Reviewed: Basic Metabolic Panel:  Recent Labs Lab 03/06/15 1818 03/07/15 0514  NA 134* 137  K 3.6 4.3  CL 98 101  CO2 21 23  GLUCOSE 165* 170*  BUN 24* 31*  CREATININE 1.23 1.40*  CALCIUM 9.6 8.8   Liver Function Tests:  Recent Labs Lab 03/06/15 1818 03/07/15 0514  AST 271* 221*  ALT 223* 205*  ALKPHOS 309* 244*  BILITOT 7.5* 7.7*  PROT 7.6 6.4  ALBUMIN 3.7 3.0*    Recent Labs Lab 03/06/15 2354  LIPASE 15   No results for input(s): AMMONIA in the last 168 hours. CBC:  Recent Labs Lab 03/06/15 1818 03/07/15 0514  WBC 6.1 12.6*  NEUTROABS 5.7 11.2*  HGB 12.8* 11.0*  HCT 37.9* 33.9*  MCV 86.7 86.9  PLT 441* 401*   Cardiac Enzymes:  Recent Labs Lab  03/06/15 2354 03/07/15 0514 03/07/15 1206  TROPONINI 0.04* 0.04* 0.03   BNP: Invalid input(s): POCBNP CBG:  Recent Labs Lab 03/07/15 1649 03/07/15 1958 03/08/15 0010 03/08/15 0334 03/08/15 0818  GLUCAP 127* 149* 143* 143* 163*    Recent Results (from the past 240 hour(s))  Urine culture     Status: None   Collection Time: 03/06/15  6:00 PM  Result Value Ref Range Status   Specimen Description URINE, CLEAN CATCH  Final   Special Requests NONE  Final   Colony Count   Final    6,000 COLONIES/ML Performed at [facility]    Culture   Final    INSIGNIFICANT GROWTH Performed at [facility]    Report Status 03/07/2015 FINAL  Final  Blood Culture (routine x 2)     Status: None (Preliminary result)   Collection Time: 03/06/15  6:18 PM  Result Value Ref Range Status   Specimen Description BLOOD LEFT ARM  Final   Special Requests BOTTLES DRAWN AEROBIC AND ANAEROBIC 5CC  Final   Culture   Final    GRAM NEGATIVE RODS Note: Gram Stain Report Called to,Read Back By  and Verified With: ANGELA ASHLEY BY INGRAM A 4/30 1020AM Performed at [facility]    Report Status PENDING  Incomplete  Blood Culture (routine x 2)     Status: None (Preliminary result)   Collection Time: 03/06/15  6:19 PM  Result Value Ref Range Status   Specimen Description BLOOD RIGHT ARM  Final   Special Requests BOTTLES DRAWN AEROBIC AND ANAEROBIC 5CC  Final   Culture   Final    GRAM NEGATIVE RODS Note: Gram Stain Report Called to,Read Back By and Verified With: ANGELA ASHLEY BY INGRAM A 4/30 1020AM Performed at [facility]    Report Status PENDING  Incomplete  MRSA PCR Screening     Status: None   Collection Time: 03/07/15  5:49 AM  Result Value Ref Range Status   MRSA by PCR NEGATIVE NEGATIVE Final    Comment:        The GeneXpert MRSA Assay (FDA approved for NASAL specimens only), is one component of a comprehensive MRSA colonization surveillance program. It is  not intended to diagnose MRSA infection nor to guide or monitor treatment for MRSA infections.      Scheduled Meds: . antiseptic oral rinse  7 mL Mouth Rinse BID  . dorzolamide-timolol  1 drop Both Eyes BID  . fentaNYL      . fluticasone  1 spray Each Nare Daily  . heparin subcutaneous  5,000 Units Subcutaneous 3 times per day  . HYDROmorphone      . insulin aspart  0-9 Units Subcutaneous 6 times per day  . piperacillin-tazobactam  3.375 g Intravenous 3 times per day  . venlafaxine XR  75 mg Oral Daily   Continuous Infusions: . sodium chloride    . sodium chloride 0.9 % 1,000 mL with potassium chloride 20 mEq infusion 100 mL/hr at 03/08/15 1022   Anti-infectives    Start     Dose/Rate Route Frequency Ordered Stop   03/07/15 1800  vancomycin (VANCOCIN) IVPB 1000 mg/200 mL premix  Status:  Discontinued     1,000 mg 200 mL/hr over 60 Minutes Intravenous Every 12 hours 03/07/15 0551 03/07/15 1524   03/07/15 0600  piperacillin-tazobactam (ZOSYN) IVPB 3.375 g     3.375 g 12.5 mL/hr over 240 Minutes Intravenous 3 times per day 03/07/15 0449     03/07/15 0530  vancomycin (VANCOCIN) 2,500 mg in sodium chloride 0.9 % 500 mL IVPB     2,500 mg 250 mL/hr over 120 Minutes Intravenous  Once 03/07/15 0456 03/07/15 0749   03/06/15 2315  piperacillin-tazobactam (ZOSYN) IVPB 3.375 g  Status:  Discontinued     3.375 g 100 mL/hr over 30 Minutes Intravenous 3 times per day 03/06/15 2309 03/07/15 0449   03/06/15 2100  imipenem-cilastatin (PRIMAXIN) 500 mg in sodium chloride 0.9 % 100 mL IVPB     500 mg 200 mL/hr over 30 Minutes Intravenous  Once 03/06/15 2008 03/06/15 2130   03/06/15 1900  cefTRIAXone (ROCEPHIN) 1 g in dextrose 5 % 50 mL IVPB     1 g 100 mL/hr over 30 Minutes Intravenous NOW 03/06/15 1858 03/06/15 1934        Phillips Climes, MD  [facility] Pager [phone]  If 7PM-7AM, please contact night-coverage [email or web address] Password [email or web address] 03/08/2015, 11:45 AM   LOS: 2  days

## 2015-03-08 NOTE — Anesthesia Preprocedure Evaluation (Addendum)
Anesthesia Evaluation  Patient identified by MRN, date of birth, ID band Patient awake    Reviewed: Allergy & Precautions, NPO status , Patient's Chart, lab work & pertinent test results, reviewed documented beta blocker date and time   Airway Mallampati: III  TM Distance: >3 FB Neck ROM: Full    Dental no notable dental hx. (+) Teeth Intact   Pulmonary sleep apnea , pneumonia -, former smoker,  On droplet precautions- tests negative  breath sounds clear to auscultation  Pulmonary exam normal       Cardiovascular hypertension, Pt. on medications Rhythm:Regular Rate:Normal     Neuro/Psych negative neurological ROS  negative psych ROS   GI/Hepatic Neg liver ROS, Cholangitis Elevated LFT's Choledocholithiasis Cholelithiasis   Endo/Other  diabetes, Poorly Controlled, Type 2, Oral Hypoglycemic AgentsMorbid obesity  Renal/GU negative Renal ROS  negative genitourinary   Musculoskeletal  (+) Arthritis -, Osteoarthritis,  Recent Left TKR 02/17/2015 under [facility]   Abdominal (+) + obese,   Peds  Hematology  (+) anemia , Elevated PT   Anesthesia Other Findings   Reproductive/Obstetrics negative OB ROS                            Anesthesia Physical Anesthesia Plan  ASA: III  Anesthesia Plan: General   Post-op Pain Management:    Induction: Intravenous  Airway Management Planned: Oral ETT  Additional Equipment:   Intra-op Plan:   Post-operative Plan: Extubation in OR  Informed Consent: I have reviewed the patients History and Physical, chart, labs and discussed the procedure including the risks, benefits and alternatives for the proposed anesthesia with the patient or authorized representative who has indicated his/her understanding and acceptance.   Dental advisory given  Plan Discussed with: CRNA, Anesthesiologist and Surgeon  Anesthesia Plan Comments:         Anesthesia Quick  Evaluation

## 2015-03-08 NOTE — Addendum Note (Signed)
Addendum  created 03/08/15 1431 by Montel Clock, CRNA   Modules edited: Anesthesia Attestations

## 2015-03-08 NOTE — Op Note (Signed)
[facility] [place], [id number]   ERCP PROCEDURE REPORT        EXAM DATE: 03/31/15  PATIENT NAME:          Justin Cohen, Justin Cohen          MR #: [id number] BIRTHDATE:       [birth year]     VISIT #:     [id number] ATTENDING:     Inda Castle, MD     STATUS:     inpatient ASSISTANT:      Sharon Mt and Berneta Levins   INDICATIONS:  The patient is a 69 yr old male here for an ERCP due to suspected or rule out bile duct stones. PROCEDURE PERFORMED:     ERCP with removal of calculus/calculi ERCP with sphincterotomy/papillotomy MEDICATIONS:     Per Anesthesia  CONSENT: The patient understands the risks and benefits of the procedure and understands that these risks include, but are not limited to: sedation, allergic reaction, infection, perforation and/or bleeding. Alternative means of evaluation and treatment include, among others: physical exam, x-rays, and/or surgical intervention. The patient elects to proceed with this endoscopic procedure.  DESCRIPTION OF PROCEDURE: During intra-op preparation period all mechanical & medical equipment was checked for proper function. Hand hygiene and appropriate measures for infection prevention was taken. After the risks, benefits and alternatives of the procedure were thoroughly explained, Informed was verified, confirmed and timeout was successfully executed by the treatment team. With the patient in left semi-prone position, medications were administered intravenously.The    was passed from the mouth into the esophagus and further advanced from the esophagus into the stomach. From stomach scope was directed to the second portion of the duodenum. Major papilla was aligned with the duodenoscope. The scope position was confirmed fluoroscopically. Rest of the findings/therapeutics are given below. The scope was then completely withdrawn from the patient and the procedure completed. The pulse, BP, and  O2 saturation were monitored and documented by the physician and the nursing staff throughout the entire procedure. The patient was cared for as planned according to standard protocol. The patient was then discharged to recovery in stable condition and with appropriate post procedure care. Estimated blood loss is zero unless otherwise noted in this procedure report.  The common bile duct was selectively cannulated with a 0.35 mm guidewire.  Injection demonstrated multiple filling defects consistent with stones in the distal bile duct.  The largest stone measured 8-10 mm. 12-15 millimeter sphincterotomy cut was made.  The duct was swept with a 12 millimeter balloon stone extractor multiple times. Several stones were delivered into the duodenum.  Final cholangiogram was normal.    ADVERSE EVENT: IMPRESSIONS:     choledocholithiasis?"status post stone extraction  RECOMMENDATIONS:     elective cholecystectomy REPEAT EXAM:   ___________________________________ Inda Castle, MD eSigned:  Inda Castle, MD 03/31/2015 1:46 PM   cc:  CPT CODES: ICD9 CODES:  The ICD and CPT codes recommended by this software are interpretations from the data that the clinical staff has captured with the software.  The verification of the translation of this report to the ICD and CPT codes and modifiers is the sole responsibility of the health care institution and practicing physician where this report was generated.  [employer] will not be held responsible for the validity of the ICD and CPT codes included on this report.  AMA assumes no liability for data contained or not contained herein. CPT is  a Designer, television/film set of the [employer].   PATIENT NAME:  Justin Cohen, Justin Cohen MR#: [id number]

## 2015-03-08 NOTE — Transfer of Care (Signed)
Immediate Anesthesia Transfer of Care Note  Patient: Justin Cohen  Procedure(s) Performed: Procedure(s): ENDOSCOPIC RETROGRADE CHOLANGIOPANCREATOGRAPHY (ERCP) (N/A)  Patient Location: PACU  Anesthesia Type:General  Level of Consciousness:  sedated, patient cooperative and responds to stimulation  Airway & Oxygen Therapy:Patient Spontanous Breathing and Patient connected to face mask oxgen  Post-op Assessment:  Report given to PACU RN and Post -op Vital signs reviewed and stable  Post vital signs:  Reviewed and stable  Last Vitals:  Filed Vitals:   03/08/15 1125  BP:   Pulse:   Temp: 37 C  Resp:     Complications: No apparent anesthesia complications

## 2015-03-08 NOTE — Interval H&P Note (Signed)
History and Physical Interval Note:  03/08/2015 12:53 PM  Justin Cohen  has presented today for surgery, with the diagnosis of cholangitis  The various methods of treatment have been discussed with the patient and family. After consideration of risks, benefits and other options for treatment, the patient has consented to  Procedure(s): ENDOSCOPIC RETROGRADE CHOLANGIOPANCREATOGRAPHY (ERCP) (N/A) as a surgical intervention .  The patient's history has been reviewed, patient examined, no change in status, stable for surgery.  I have reviewed the patient's chart and labs.  Questions were answered to the patient's satisfaction.    The recent H&P (dated *03/07/15**) was reviewed, the patient was examined and there is no change in the patients condition since that H&P was completed.   Erskine Emery  03/08/2015, 12:53 PM    Erskine Emery

## 2015-03-08 NOTE — Progress Notes (Signed)
Multiple bile duct stones were extracted from the duct following sphincterotomy. May advance diet. Cholecystectomy per surgery

## 2015-03-08 NOTE — Progress Notes (Signed)
Pt stated that he didn't want to use the CPAP tonight.  Pt only wants to use the O2, RT to monitor and assess as needed.

## 2015-03-09 DIAGNOSIS — E119 Type 2 diabetes mellitus without complications: Secondary | ICD-10-CM

## 2015-03-09 DIAGNOSIS — A4151 Sepsis due to Escherichia coli [E. coli]: Principal | ICD-10-CM

## 2015-03-09 LAB — COMPREHENSIVE METABOLIC PANEL
ALBUMIN: 2.8 g/dL — AB (ref 3.5–5.0)
ALT: 166 U/L — ABNORMAL HIGH (ref 17–63)
ANION GAP: 9 (ref 5–15)
AST: 179 U/L — AB (ref 15–41)
Alkaline Phosphatase: 452 U/L — ABNORMAL HIGH (ref 38–126)
BUN: 23 mg/dL — AB (ref 6–20)
CALCIUM: 9.1 mg/dL (ref 8.9–10.3)
CO2: 27 mmol/L (ref 22–32)
Chloride: 104 mmol/L (ref 101–111)
Creatinine, Ser: 0.82 mg/dL (ref 0.61–1.24)
GFR calc Af Amer: >60 mL/min (ref >60)
GFR calc non Af Amer: >60 mL/min (ref >60)
Glucose, Bld: 204 mg/dL — ABNORMAL HIGH (ref 70–99)
POTASSIUM: 3.9 mmol/L (ref 3.5–5.1)
SODIUM: 140 mmol/L (ref 135–145)
Total Bilirubin: 7.9 mg/dL — ABNORMAL HIGH (ref 0.3–1.2)
Total Protein: 6.6 g/dL (ref 6.5–8.1)

## 2015-03-09 LAB — CULTURE, BLOOD (ROUTINE X 2)

## 2015-03-09 LAB — GLUCOSE, CAPILLARY
GLUCOSE-CAPILLARY: 187 mg/dL — AB (ref 70–99)
GLUCOSE-CAPILLARY: 181 mg/dL — AB (ref 70–99)
GLUCOSE-CAPILLARY: 212 mg/dL — AB (ref 70–99)
Glucose-Capillary: 193 mg/dL — ABNORMAL HIGH (ref 70–99)

## 2015-03-09 LAB — CBC
HCT: 32.9 % — ABNORMAL LOW (ref 39.0–52.0)
Hemoglobin: 11.2 g/dL — ABNORMAL LOW (ref 13.0–17.0)
MCH: 29.2 pg (ref 26.0–34.0)
MCHC: 34 g/dL (ref 30.0–36.0)
MCV: 85.7 fL (ref 78.0–100.0)
Platelets: 346 10*3/uL (ref 150–400)
RBC: 3.84 MIL/uL — ABNORMAL LOW (ref 4.22–5.81)
RDW: 14.5 % (ref 11.5–15.5)
WBC: 13.4 10*3/uL — ABNORMAL HIGH (ref 4.0–10.5)

## 2015-03-09 LAB — LEGIONELLA ANTIGEN, URINE

## 2015-03-09 LAB — HEMOGLOBIN A1C
Hgb A1c MFr Bld: 7.3 % — ABNORMAL HIGH (ref 4.8–5.6)
Mean Plasma Glucose: 163 mg/dL

## 2015-03-09 MED ORDER — PRO-STAT SUGAR FREE PO LIQD
30.0000 mL | Freq: Two times a day (BID) | ORAL | Status: DC
  Administered 2015-03-09 – 2015-03-10 (×2): 30 mL via ORAL
  Filled 2015-03-09 (×7): qty 30

## 2015-03-09 MED ORDER — INSULIN GLARGINE 100 UNIT/ML ~~LOC~~ SOLN
4.0000 [IU] | Freq: Every day | SUBCUTANEOUS | Status: DC
  Administered 2015-03-09 – 2015-03-17 (×7): 4 [IU] via SUBCUTANEOUS
  Filled 2015-03-09 (×9): qty 0.04

## 2015-03-09 MED ORDER — BOOST / RESOURCE BREEZE PO LIQD: 1.0000 | Freq: Two times a day (BID) | ORAL | Status: DC

## 2015-03-09 NOTE — Progress Notes (Signed)
Pt does not want to wear CPAP while sleeping. He says it is uncomfortable and loud. Patient sating 93% on 3L [place] resting with eyes closed, will continue to monitor.

## 2015-03-09 NOTE — Progress Notes (Signed)
[facility] PROGRESS NOTE Subjective Pt had ERCP with ERS and stone removal yesterday still on ABs notes the pain is a little better but still nauseated and gassy.  Objective: Vital signs in last 24 hours: Temp:  [98.1 F (36.7 C)-99.4 F (37.4 C)] 98.9 F (37.2 C) (05/02 0800) Pulse Rate:  [73-108] 104 (05/02 0800) Resp:  [22-32] 22 (05/02 0800) BP: (120-164)/(49-79) 120/59 mmHg (05/02 0800) SpO2:  [87 %-100 %] 100 % (05/02 0800) Weight:  [126.4 kg (278 lb 10.6 oz)] 126.4 kg (278 lb 10.6 oz) (05/02 0600) Last BM Date: 03/06/15  Intake/Output from previous day: 05/01 0701 - 05/02 0700 In: 8638 [I.V.:3200; IV Piggyback:125] Out: 1771 [Urine:1275] Intake/Output this shift: Total I/O In: 312.5 [I.V.:300; IV Piggyback:12.5] Out: 150 [Urine:150]  PE: General--sleeping sedated  Lungs--clear Abdomen--distended few BSs, slight tender RUQ  Lab Results:  Recent Labs  03/06/15 1818 03/07/15 0514 03/09/15 0347  WBC 6.1 12.6* 13.4*  HGB 12.8* 11.0* 11.2*  HCT 37.9* 33.9* 32.9*  PLT 441* 401* 346   BMET  Recent Labs  03/06/15 1818 03/07/15 0514 03/09/15 0347  NA 134* 137 140  K 3.6 4.3 3.9  CL 98 101 104  CO2 21 23 27   CREATININE 1.23 1.40* 0.82   LFT  Recent Labs  03/06/15 1818 03/07/15 0514 03/09/15 0347  PROT 7.6 6.4 6.6  AST 271* 221* 179*  ALT 223* 205* 166*  ALKPHOS 309* 244* 452*  BILITOT 7.5* 7.7* 7.9*   PT/INR  Recent Labs  03/07/15 0514  LABPROT 16.0*  INR 1.26   PANCREAS  Recent Labs  03/06/15 2354  LIPASE 15         Studies/Results: Dg Ercp With Sphincterotomy  03/08/2015   CLINICAL DATA:  Cholangitis, biliary dilatation, and choledocholithiasis.  EXAM: ERCP  TECHNIQUE: Multiple spot images obtained with the fluoroscopic device and submitted for interpretation post-procedure.  COMPARISON:  None.  FINDINGS: Contrast injection into the distal common bile duct shows mild diffuse biliary ductal dilatation, as well as  several irregular filling defects within the distal common bile duct, consistent with choledocholithiasis.  Subsequent images show passage of a balloon extraction catheter. Final images show no definite residual filling defects within the common bile duct.  IMPRESSION: Mild biliary ductal dilatation and distal common bile duct calculi. No definite residual biliary calculi seen following passage of balloon extraction catheter.  These images were submitted for radiologic interpretation only. Please see the procedural report for the amount of contrast and the fluoroscopy time utilized.   Electronically Signed   By: Earle Gell M.D.   On: 03/08/2015 13:58    Medications: I have reviewed the patient's current medications.  Assessment/Plan: 1. Cholangitis/CBD stone/GB sludge. Pt on ABs, concerning WBC and TB still up but hopefully will improve with continued AB.Would continue AB follow labs.   Keano Guggenheim JR,Tajuana Kniskern L 03/09/2015, 10:45 AM  Diet:  No Change Anticoagulation: Follow-up Appointment:

## 2015-03-09 NOTE — Progress Notes (Signed)
Patient refuses CPAP for tonight.  

## 2015-03-09 NOTE — Progress Notes (Signed)
[facility]  Patient Status: Active  [facility] is providing the following services: [facility] HHPT  If patient discharges after hours, please call [phone].   Linward Headland 03/09/2015, 11:32 AM

## 2015-03-09 NOTE — Progress Notes (Signed)
PROGRESS NOTE  Justin Cohen ALP:[id number] DOB: January 17, 1946 DOA: 03/06/2015 PCP: No primary care provider on file.  Brief history This 69 year old male with a history of diabetes mellitus, hypertension, hyperlipidemia, and obstructive sleep apnea presented with 2-3 day history of right upper quadrant and epigastric abdominal pain and fever. The patient was at his orthopedist office on 03/06/2015 when he developed confusion and fever. He was sent to the emergency department. In the emergency department, the patient was noted to be relatively hypotensive with a blood pressure 94/74. He had a fever up to 103.61F. Lactic acid was 3.81. The patient was started on intravenous antibiotics and intravenous fluids. Abdominal ultrasound revealed gallbladder sludge with dilated common bile duct up to 10 mm. CT of the abdomen and pelvis showed cholelithiasis with dilated common bile duct and intrahepatic ductal dilatation. Gastroenterology was consulted, and ERCP is done 5/1 with a stone removals. Assessment/Plan:  Sepsis -Present at the time of admission, -Secondary to Escherichia coli bacteremia from cholangitis secondary to choledocholithiasis. -Continue intravenous Zosyn 4/30, vancomycin stopped 4/30. -Blood cultures Escherichia coli, sensitivities still pending. - Patient with recent left total knee replacement, well monitor closely for signs of infection.  Cholangitis/cholelithiasis -ERCP is 5/1, with stone removal -Appreciate GI consult -Continue intravenous Zosyn 4/30 -Additional surgical consult appreciated, will eventually need cholecystectomy once LFTs started trending down. - Total bili and alkaline phosphatase are increasing today, AST and ALT are improving, check LFTs in a.m.Marland Kitchen  Elevated LFTs - Secondary to cholangitis, monitor closely. See discussion as above  Acute encephalopathy -Improved -Back to baseline -Secondary to infectious process  Diabetes mellitus type 2 -  NovoLog sliding scale, will start on low dose Lantus ( 4 units subcutaneous daily) - Check Hemoglobin A1c -Continue to hold metformin  Hypertension -Initially on hold as patient was hypotensive, and septic  -Acceptable on when necessary hydralazine.   Pulmonary nodules -Noted incidentally on CT in the left lower lobe -Will need outpatient follow-up  DVT prophylaxis: Subcutaneous heparin  Family Communication:  Wife at beside Disposition Plan:  Remains in stepdown, home when stable       Procedures/Studies: Ct Head Wo Contrast  03/06/2015   CLINICAL DATA:  Altered mental status started today.  Fever.  EXAM: CT HEAD WITHOUT CONTRAST  TECHNIQUE: Contiguous axial images were obtained from the base of the skull through the vertex without intravenous contrast.  COMPARISON:  None.  FINDINGS: There is mild central and cortical atrophy. Periventricular white matter change is consistent with small vessel disease. There is no intra or extra-axial fluid collection or mass lesion. The basilar cisterns and ventricles have a normal appearance. There is no CT evidence for acute infarction or hemorrhage. Bone windows are unremarkable.  IMPRESSION: 1. Atrophy and small vessel disease. 2.  No evidence for acute intracranial  abnormality.   Electronically Signed   By: Nolon Nations M.D.   On: 03/06/2015 21:52   US Abdomen Complete  03/06/2015   CLINICAL DATA:  Fever right abdominal pain for 2 days concern for cholangitis  EXAM: ULTRASOUND ABDOMEN COMPLETE  COMPARISON:  None.  FINDINGS: Gallbladder: Nonmobile sludge with tiny echogenic foci possibly representing calculi. No wall thickening.  Common bile duct: Diameter: Measures up to about 1 cm. There is echogenic material within the duct.  Liver: Heterogeneous echotexture with no focal abnormalities. Probable fatty infiltration.  IVC: Not seen well  Pancreas: Not seen well  Spleen: Size and appearance within normal limits.  Right  Kidney: Length: 13 cm.  Echogenicity within normal limits. No mass or hydronephrosis visualized.  Left Kidney: Length: 14 cm. Echogenicity within normal limits. No mass or hydronephrosis visualized.  Abdominal aorta: Obscured by bowel gas  Other findings: None.  IMPRESSION: Dilated common bile duct. Distal obstructing lesion not excluded. Echogenic material within the duct suggesting sludge.  Abundant gallbladder sludge.   Electronically Signed   By: Skipper Cliche M.D.   On: 03/06/2015 21:04   Ct Abdomen Pelvis W Contrast  03/07/2015   CLINICAL DATA:  Acute onset of generalized abdominal pain. Initial encounter.  EXAM: CT ABDOMEN AND PELVIS WITH CONTRAST  TECHNIQUE: Multidetector CT imaging of the abdomen and pelvis was performed using the standard protocol following bolus administration of intravenous contrast.  CONTRAST:  178mL OMNIPAQUE IOHEXOL 300 MG/ML  SOLN  COMPARISON:  Abdominal ultrasound performed earlier today at 8:25 p.m.  FINDINGS: Multiple scattered small nodules are noted at the left lower lobe, measuring up to 6 mm in size. Given multiplicity of nodules, these are thought to be postinfectious in nature.  A few stones are seen dependently within the gallbladder. The gallbladder is mildly distended. The common bile duct is dilated to 1.0 cm in diameter, with intrahepatic biliary ductal dilatation, and suggestion of multiple small stones within the distal common bile duct. The liver and spleen are otherwise unremarkable. The pancreas and adrenal glands are unremarkable.  Scattered small right renal cysts are noted. These measure up to 1.4 cm in size. Nonspecific perinephric stranding and fluid are noted bilaterally. The left kidney is otherwise unremarkable. No renal or ureteral stones are identified. There is no evidence of hydronephrosis.  No free fluid is identified. The small bowel is unremarkable in appearance. The stomach is within normal limits. No acute vascular abnormalities are seen. Scattered calcification is  seen along the abdominal aorta and its branches.  The appendix is normal in caliber, without evidence of appendicitis. The colon is unremarkable in appearance.  The bladder is decompressed and not well assessed. The patient is status post prostatectomy. Small bilateral inguinal hernias are seen, containing only fat. No inguinal lymphadenopathy is seen.  No acute osseous abnormalities are identified. Multilevel vacuum phenomenon is noted along the lumbar spine. Underlying facet disease is noted.  IMPRESSION: 1. Cholelithiasis, with mild distention of the gallbladder, dilatation of the common bile duct to 1.0 cm in diameter, and intrahepatic biliary duct dilatation. Suggestion of multiple small stones within the distal common bile duct. Findings raise concern for distal obstruction. ERCP or MRCP could be performed for further evaluation. 2. Scattered small right renal cyst noted. 3. Scattered calcification along the abdominal aorta and its branches. 4. Small bilateral inguinal hernias, containing only fat. 5. Minimal degenerative change along the lumbar spine. 6. Multiple scattered small nodules at the left lower lung lobe, measuring up to 6 mm. Given multiplicity of nodules, these are most likely postinfectious in nature. However, if the patient is at high risk for bronchogenic carcinoma, follow-up chest CT at 6-12 months is recommended. If the patient is at low risk for bronchogenic carcinoma, follow-up chest CT at 12 months is recommended. This recommendation follows the consensus statement: Guidelines for Management of Small Pulmonary Nodules Detected on CT Scans: A Statement from the [place] as published in Radiology 2005;237:395-400.   Electronically Signed   By: Garald Balding M.D.   On: 03/07/2015 00:43   Dg Chest Port 1 View  03/06/2015   CLINICAL DATA:  Two day history of fever  EXAM: PORTABLE CHEST - 1 VIEW  COMPARISON:  None.  FINDINGS: There is patchy infiltrate in the left lower lobe. Lungs  elsewhere clear. Heart is upper normal in size with pulmonary vascularity within normal limits. No adenopathy. No bone lesions.  IMPRESSION: Patchy infiltrate left lower lobe.   Electronically Signed   By: Lowella Grip III M.D.   On: 03/06/2015 20:08   Dg Ercp With Sphincterotomy  03/08/2015   CLINICAL DATA:  Cholangitis, biliary dilatation, and choledocholithiasis.  EXAM: ERCP  TECHNIQUE: Multiple spot images obtained with the fluoroscopic device and submitted for interpretation post-procedure.  COMPARISON:  None.  FINDINGS: Contrast injection into the distal common bile duct shows mild diffuse biliary ductal dilatation, as well as several irregular filling defects within the distal common bile duct, consistent with choledocholithiasis.  Subsequent images show passage of a balloon extraction catheter. Final images show no definite residual filling defects within the common bile duct.  IMPRESSION: Mild biliary ductal dilatation and distal common bile duct calculi. No definite residual biliary calculi seen following passage of balloon extraction catheter.  These images were submitted for radiologic interpretation only. Please see the procedural report for the amount of contrast and the fluoroscopy time utilized.   Electronically Signed   By: Earle Gell M.D.   On: 03/08/2015 13:58        Subjective: Still complaining of abdominal pain. Denies any chest pain, shortness breath, coughing, hemoptysis, vomiting, diarrhea, dysuria, hematochezia. Denies any fevers or chills.  Objective: Filed Vitals:   03/09/15 0400 03/09/15 0600 03/09/15 0800 03/09/15 1200  BP: 152/49  120/59 144/61  Pulse: 73  104 77  Temp: 98.5 F (36.9 C)  98.9 F (37.2 C)   TempSrc: Oral  Oral   Resp: 25  22 25   Height:      Weight:  126.4 kg (278 lb 10.6 oz)    SpO2: 90%  100% 96%    Intake/Output Summary (Last 24 hours) at 03/09/15 1208 Last data filed at 03/09/15 1200  Gross per 24 hour  Intake 3337.5 ml  Output    1400 ml  Net 1937.5 ml   Weight change:  Exam:   General:  Pt is alert, follows commands appropriately, not in acute distress  HEENT: No icterus, No thrush,[place]/AT  Cardiovascular: RRR, S1/S2, no rubs, no gallops  Respiratory: CTA bilaterally, no wheezing, no crackles, no rhonchi  Abdomen: Soft/+BS, mild tenderness in right upper quadrant, , no guarding  Extremities: trace edema, No lymphangitis, No petechiae, No rashes, no synovitis;  left knee incision site without any erythema or drainage  Data Reviewed: Basic Metabolic Panel:  Recent Labs Lab 03/06/15 1818 03/07/15 0514 03/09/15 0347  NA 134* 137 140  K 3.6 4.3 3.9  CL 98 101 104  CO2 21 23 27   GLUCOSE 165* 170* 204*  BUN 24* 31* 23*  CREATININE 1.23 1.40* 0.82  CALCIUM 9.6 8.8 9.1   Liver Function Tests:  Recent Labs Lab 03/06/15 1818 03/07/15 0514 03/09/15 0347  AST 271* 221* 179*  ALT 223* 205* 166*  ALKPHOS 309* 244* 452*  BILITOT 7.5* 7.7* 7.9*  PROT 7.6 6.4 6.6  ALBUMIN 3.7 3.0* 2.8*    Recent Labs Lab 03/06/15 2354  LIPASE 15   No results for input(s): AMMONIA in the last 168 hours. CBC:  Recent Labs Lab 03/06/15 1818 03/07/15 0514 03/09/15 0347  WBC 6.1 12.6* 13.4*  NEUTROABS 5.7 11.2*  --   HGB 12.8* 11.0* 11.2*  HCT 37.9* 33.9* 32.9*  MCV 86.7 86.9 85.7  PLT 441* 401* 346   Cardiac Enzymes:  Recent Labs Lab 03/06/15 2354 03/07/15 0514 03/07/15 1206  TROPONINI 0.04* 0.04* 0.03   BNP: Invalid input(s): POCBNP CBG:  Recent Labs Lab 03/08/15 2106 03/08/15 2336 03/09/15 0347 03/09/15 0739 03/09/15 1125  GLUCAP 180* 192* 187* 193* 181*    Recent Results (from the past 240 hour(s))  Urine culture     Status: None   Collection Time: 03/06/15  6:00 PM  Result Value Ref Range Status   Specimen Description URINE, CLEAN CATCH  Final   Special Requests NONE  Final   Colony Count   Final    6,000 COLONIES/ML Performed at [facility]    Culture   Final     INSIGNIFICANT GROWTH Performed at [facility]    Report Status 03/07/2015 FINAL  Final  Blood Culture (routine x 2)     Status: None   Collection Time: 03/06/15  6:18 PM  Result Value Ref Range Status   Specimen Description BLOOD LEFT ARM  Final   Special Requests BOTTLES DRAWN AEROBIC AND ANAEROBIC 5CC  Final   Culture   Final    ESCHERICHIA COLI Note: Gram Stain Report Called to,Read Back By and Verified With: ANGELA ASHLEY BY INGRAM A 4/30 1020AM Performed at [facility]    Report Status 03/09/2015 FINAL  Final   Organism ID, Bacteria ESCHERICHIA COLI  Final      Susceptibility   Escherichia coli - MIC*    AMPICILLIN >=32 RESISTANT Resistant     AMPICILLIN/SULBACTAM >=32 RESISTANT Resistant     CEFAZOLIN <=4 SENSITIVE Sensitive     CEFEPIME <=1 SENSITIVE Sensitive     CEFTAZIDIME <=1 SENSITIVE Sensitive     CEFTRIAXONE <=1 SENSITIVE Sensitive     CIPROFLOXACIN >=4 RESISTANT Resistant     GENTAMICIN >=16 RESISTANT Resistant     IMIPENEM <=0.25 SENSITIVE Sensitive     PIP/TAZO <=4 SENSITIVE Sensitive     TOBRAMYCIN 8 INTERMEDIATE Intermediate     TRIMETH/SULFA <=20 SENSITIVE Sensitive     * ESCHERICHIA COLI  Blood Culture (routine x 2)     Status: None   Collection Time: 03/06/15  6:19 PM  Result Value Ref Range Status   Specimen Description BLOOD RIGHT ARM  Final   Special Requests BOTTLES DRAWN AEROBIC AND ANAEROBIC 5CC  Final   Culture   Final    ESCHERICHIA COLI Note: SUSCEPTIBILITIES PERFORMED ON PREVIOUS CULTURE WITHIN THE LAST 5 DAYS. Note: Gram Stain Report Called to,Read Back By and Verified With: ANGELA ASHLEY BY INGRAM A 4/30 1020AM Performed at [facility]    Report Status 03/09/2015 FINAL  Final  MRSA PCR Screening     Status: None   Collection Time: 03/07/15  5:49 AM  Result Value Ref Range Status   MRSA by PCR NEGATIVE NEGATIVE Final    Comment:        The GeneXpert MRSA Assay (FDA approved for NASAL specimens only), is one  component of a comprehensive MRSA colonization surveillance program. It is not intended to diagnose MRSA infection nor to guide or monitor treatment for MRSA infections.      Scheduled Meds: . antiseptic oral rinse  7 mL Mouth Rinse BID  . dorzolamide-timolol  1 drop Both Eyes BID  . fluticasone  1 spray Each Nare Daily  . heparin subcutaneous  5,000 Units Subcutaneous 3 times per day  . indomethacin  100 mg Rectal Once  .  insulin aspart  0-9 Units Subcutaneous 6 times per day  . piperacillin-tazobactam  3.375 g Intravenous 3 times per day  . venlafaxine XR  75 mg Oral Daily   Continuous Infusions: . sodium chloride 0.9 % 1,000 mL with potassium chloride 20 mEq infusion 100 mL/hr at 03/09/15 0900   Anti-infectives    Start     Dose/Rate Route Frequency Ordered Stop   03/07/15 1800  vancomycin (VANCOCIN) IVPB 1000 mg/200 mL premix  Status:  Discontinued     1,000 mg 200 mL/hr over 60 Minutes Intravenous Every 12 hours 03/07/15 0551 03/07/15 1524   03/07/15 0600  piperacillin-tazobactam (ZOSYN) IVPB 3.375 g     3.375 g 12.5 mL/hr over 240 Minutes Intravenous 3 times per day 03/07/15 0449     03/07/15 0530  vancomycin (VANCOCIN) 2,500 mg in sodium chloride 0.9 % 500 mL IVPB     2,500 mg 250 mL/hr over 120 Minutes Intravenous  Once 03/07/15 0456 03/07/15 0749   03/06/15 2315  piperacillin-tazobactam (ZOSYN) IVPB 3.375 g  Status:  Discontinued     3.375 g 100 mL/hr over 30 Minutes Intravenous 3 times per day 03/06/15 2309 03/07/15 0449   03/06/15 2100  imipenem-cilastatin (PRIMAXIN) 500 mg in sodium chloride 0.9 % 100 mL IVPB     500 mg 200 mL/hr over 30 Minutes Intravenous  Once 03/06/15 2008 03/06/15 2130   03/06/15 1900  cefTRIAXone (ROCEPHIN) 1 g in dextrose 5 % 50 mL IVPB     1 g 100 mL/hr over 30 Minutes Intravenous NOW 03/06/15 1858 03/06/15 1934        Phillips Climes, MD  [facility] Pager [phone]  If 7PM-7AM, please contact  night-coverage [email or web address] Password [email or web address] 03/09/2015, 12:08 PM   LOS: 3 days

## 2015-03-09 NOTE — Progress Notes (Signed)
1 Day Post-Op  Subjective: Still nauseated, not eating, pain on right side. Lower abdomen seem a little more tender than upper abdomen this AM.    Objective: Vital signs in last 24 hours: Temp:  [98.1 F (36.7 C)-99.4 F (37.4 C)] 98.5 F (36.9 C) (05/02 0400) Pulse Rate:  [73-108] 73 (05/02 0400) Resp:  [11-32] 25 (05/02 0400) BP: (145-164)/(49-79) 152/49 mmHg (05/02 0400) SpO2:  [87 %-100 %] 90 % (05/02 0400) Weight:  [126.4 kg (278 lb 10.6 oz)] 126.4 kg (278 lb 10.6 oz) (05/02 0600) Last BM Date: 03/06/15 PO none recorded  Clear diet TM 99.3 RR 24-32 Tachycardic last PM LFT's still up WBC still up Intake/Output from previous day: 05/01 0701 - 05/02 0700 In: 3212.5 [I.V.:3100; IV Piggyback:112.5] Out: 6010 [Urine:1275] Intake/Output this shift:    General appearance: alert, cooperative and no distress Resp: some basilar rales, sleeping without CPAP, they cannot find one that is comffortable for him. GI: soft, large abdomen more tender on right, lower abdomen a bit more tender than the upper abdomen.  Lab Results:   Recent Labs  03/07/15 0514 03/09/15 0347  WBC 12.6* 13.4*  HGB 11.0* 11.2*  HCT 33.9* 32.9*  PLT 401* 346    Recent Labs Lab 03/06/15 1818 03/07/15 0514 03/09/15 0347  AST 271* 221* 179*  ALT 223* 205* 166*  ALKPHOS 309* 244* 452*  BILITOT 7.5* 7.7* 7.9*  PROT 7.6 6.4 6.6  ALBUMIN 3.7 3.0* 2.8*    BMET  Recent Labs  03/07/15 0514 03/09/15 0347  NA 137 140  K 4.3 3.9  CL 101 104  CO2 23 27  GLUCOSE 170* 204*  BUN 31* 23*  CREATININE 1.40* 0.82  CALCIUM 8.8 9.1   PT/INR  Recent Labs  03/07/15 0514  LABPROT 16.0*  INR 1.26     Recent Labs Lab 03/06/15 1818 03/07/15 0514 03/09/15 0347  AST 271* 221* 179*  ALT 223* 205* 166*  ALKPHOS 309* 244* 452*  BILITOT 7.5* 7.7* 7.9*  PROT 7.6 6.4 6.6  ALBUMIN 3.7 3.0* 2.8*     Lipase     Component Value Date/Time   LIPASE 15 03/06/2015 2354     Studies/Results: Dg Ercp  With Sphincterotomy  03/08/2015   CLINICAL DATA:  Cholangitis, biliary dilatation, and choledocholithiasis.  EXAM: ERCP  TECHNIQUE: Multiple spot images obtained with the fluoroscopic device and submitted for interpretation post-procedure.  COMPARISON:  None.  FINDINGS: Contrast injection into the distal common bile duct shows mild diffuse biliary ductal dilatation, as well as several irregular filling defects within the distal common bile duct, consistent with choledocholithiasis.  Subsequent images show passage of a balloon extraction catheter. Final images show no definite residual filling defects within the common bile duct.  IMPRESSION: Mild biliary ductal dilatation and distal common bile duct calculi. No definite residual biliary calculi seen following passage of balloon extraction catheter.  These images were submitted for radiologic interpretation only. Please see the procedural report for the amount of contrast and the fluoroscopy time utilized.   Electronically Signed   By: Earle Gell M.D.   On: 03/08/2015 13:58    Medications: . antiseptic oral rinse  7 mL Mouth Rinse BID  . dorzolamide-timolol  1 drop Both Eyes BID  . fluticasone  1 spray Each Nare Daily  . heparin subcutaneous  5,000 Units Subcutaneous 3 times per day  . indomethacin  100 mg Rectal Once  . insulin aspart  0-9 Units Subcutaneous 6 times per day  .  piperacillin-tazobactam  3.375 g Intravenous 3 times per day  . venlafaxine XR  75 mg Oral Daily    Assessment/Plan Sepsis with cholangitis, cholelithiasis, choledocholithiais S/p ERCP 03/08/15 Dr. Deatra Ina S/p left Knee replacement, 02/17/15 Hx of prostate cancer AODM Sleep Apnea on CPAP Body mass index is 42.38  LFT's still up, AST, ALT is better, alk Phos and T bil up.   Day 4 Zosyn, Vancomycin x 1 dose 4/29. DVT:  Heparin/SCD  Plan:  I would continue Zosyn, recheck labs in AM. I'm not sure why T Bil is going up.  Will discuss with GI and Dr. Emogene Morgan.  Mobilize, keep  on clears for now, and IS.       LOS: 3 days    Lorenda Grecco 03/09/2015

## 2015-03-09 NOTE — Care Management Note (Signed)
Case Management Note  Patient Details  Name: KLEBER CREAN MRN: [id number] Date of Birth: 1946/05/14  Subjective/Objective:                The patient is a 69 yr old male here for an ERCP due to suspected or rule out bile duct stones. PROCEDURE PERFORMED:     ERCP with removal of calculus/calculi ERCP with sphincterotomy/papillotomy   1 Day Post-Op  Subjective: Still nauseated, not eating, pain on right side. Lower abdomen seem a little more tender than upper abdomen this AM.   Updated: 03/09/15 0424      WBC 13.4 (H) K/uL      RBC 3.84 (L) MIL/uL      Hemoglobin 11.2 (L) g/dL      HCT 32.9 (L) %      Procedure Component Value Units Date/Time    Blood Culture (routine x 2) [[id number]] Collected: 03/06/15 1819     Updated: 03/09/15 0841                          Result:       ESCHERICHIA COLI       Report Status 03/09/2015 FINAL       Action/Plan:   Expected Discharge Date:  [id number]               Expected Discharge Plan:     In-House Referral:     Discharge planning Services     Post Acute Care Choice:    Choice offered to:     DME Arranged:    DME Agency:     HH Arranged:    [place] Agency:     Status of Service:     Medicare Important Message Given:    Date Medicare IM Given:    Medicare IM give by:    Date Additional Medicare IM Given:    Additional Medicare Important Message give by:     If discussed at [place] of Stay Meetings, dates discussed:    Additional Comments:  Leeroy Cha, RN 03/09/2015, 9:38 AM

## 2015-03-09 NOTE — Progress Notes (Signed)
Mar 09, 2015/Rhonda L. Davis, RN, BSN, CCM. Case Management [facility] [phone] No discharge needs present of time of review. 

## 2015-03-09 NOTE — Progress Notes (Signed)
Initial Nutrition Assessment  DOCUMENTATION CODES:  Non-severe (moderate) malnutrition in context of acute illness/injury  INTERVENTION:  Boost Breeze, Prostat BID  NUTRITION DIAGNOSIS:  Inadequate oral intake related to acute illness as evidenced by meal completion < 25%, percent weight loss.  GOAL:  Patient will meet greater than or equal to 90% of their needs  MONITOR:  PO intake, Supplement acceptance, Diet advancement, Labs, Weight trends  REASON FOR ASSESSMENT:  Malnutrition Screening Tool    ASSESSMENT: This 69 year old male with a history of diabetes mellitus, hypertension, hyperlipidemia, and obstructive sleep apnea presented with 2-3 day history of right upper quadrant and epigastric abdominal pain and fever. The patient was at his orthopedist office on 03/06/2015 when he developed confusion and fever. Total L knee replacement 02/17/2015. Gallstone removal 5/01, cholecystectomy once labs improve.   Pt asleep at time of visit. Pt's wife at bedside. Per wife, pt's appetite has been poor since the knee surgery and even worse since gall bladder problems started. Pt is currently on Clear Liquid diet and only consumes a bite here and there. Pt has lost 15 Lb in 3 weeks (significant for time frame), but NFPE showed no muscle or fat depletion. Pt and wife agreed to [employer] and Prostat BID to increase nutrient and protein intake. Will monitor for diet advancement.   Labs reviewed: Glu 204, BUN 23  Height:  Ht Readings from Last 1 Encounters:  03/07/15 5\' 8"  (1.727 m)    Weight:  Wt Readings from Last 1 Encounters:  03/09/15 278 lb 10.6 oz (126.4 kg)    Ideal Body Weight:  70 kg  Wt Readings from Last 10 Encounters:  03/09/15 278 lb 10.6 oz (126.4 kg)    BMI:  Body mass index is 42.38 kg/(m^2).  Estimated Nutritional Needs:  Kcal:  2100 - 2300  Protein:  130 - 145  Fluid:  2.0 L  Skin:  Reviewed, no issues (surgical incision)  Diet Order:  Diet clear  liquid Room service appropriate?: Yes; Fluid consistency:: Thin  EDUCATION NEEDS:  No education needs identified at this time   Intake/Output Summary (Last 24 hours) at 03/09/15 1550 Last data filed at 03/09/15 1400  Gross per 24 hour  Intake   2360 ml  Output   1400 ml  Net    960 ml    Last BM:  5/01  Russell Quinney A. [employer] Dietetic Intern Pager: [phone] 03/09/2015 3:58 PM

## 2015-03-09 NOTE — Care Management Note (Signed)
Case Management Note  Patient Details  Name: Justin Cohen MRN: [id number] Date of Birth: 1946-02-29  Subjective/Objective:                    Action/Plan: home   Expected Discharge Date:  [id number]              Expected Discharge Plan:  Home/Self Care  In-House Referral:     Discharge planning Services  CM Consult  Post Acute Care Choice:    Choice offered to:     DME Arranged:    DME Agency:     HH Arranged:    [place] Agency:     Status of Service:  In process, will continue to follow  Medicare Important Message Given:    Date Medicare IM Given:    Medicare IM give by:    Date Additional Medicare IM Given:    Additional Medicare Important Message give by:     If discussed at [place] of Stay Meetings, dates discussed:    Additional Comments:  Leeroy Cha, RN 03/09/2015, 10:23 AM

## 2015-03-09 NOTE — Progress Notes (Signed)
Inpatient Diabetes Program Recommendations  AACE/ADA: New Consensus Statement on Inpatient Glycemic Control (2013)  Target Ranges:  Prepandial:   less than 140 mg/dL      Peak postprandial:   less than 180 mg/dL (1-2 hours)      Critically ill patients:  140 - 180 mg/dL    Results for Justin Cohen, Justin Cohen (MRN [id number]) as of 03/09/2015 09:11  Ref. Range 03/08/2015 23:36 03/09/2015 03:47 03/09/2015 07:39  Glucose-Capillary Latest Ref Range: 70-99 mg/dL 192 (H) 187 (H) 193 (H)     Admit with: Sepsis from Cholangitis/ Cholecystitis  History: DM, HTN  Home DM Meds: Glipizide 5 mg daily       Metformin 1000 mg bid        Actos 45 mg daily  Current DM Orders: Novolog Sensitive SSI Q4 hours     MD- Note patient's CBGs slightly elevated today.  Please consider increasing Novolog SSI to Moderate scale Q4 hours (Currently ordered as Sensitive scale)     Will follow Wyn Quaker RN, MSN, CDE Diabetes Coordinator Inpatient Diabetes Program Team Pager: [phone] (8a-5p)

## 2015-03-10 ENCOUNTER — Inpatient Hospital Stay (HOSPITAL_COMMUNITY): Payer: Commercial Managed Care - HMO | Admitting: Certified Registered Nurse Anesthetist

## 2015-03-10 ENCOUNTER — Encounter (HOSPITAL_COMMUNITY): Payer: Self-pay | Admitting: Gastroenterology

## 2015-03-10 ENCOUNTER — Encounter (HOSPITAL_COMMUNITY)
Admission: EM | Disposition: A | Discharge status: Home Health | Payer: Self-pay | Source: Home / Self Care | Attending: Family Medicine

## 2015-03-10 ENCOUNTER — Inpatient Hospital Stay (HOSPITAL_COMMUNITY): Payer: Commercial Managed Care - HMO

## 2015-03-10 HISTORY — PX: ERCP: SHX5425

## 2015-03-10 LAB — LIPASE, BLOOD: Lipase: 15 U/L — ABNORMAL LOW (ref 22–51)

## 2015-03-10 LAB — COMPREHENSIVE METABOLIC PANEL
ALBUMIN: 2.5 g/dL — AB (ref 3.5–5.0)
ALT: 129 U/L — ABNORMAL HIGH (ref 17–63)
AST: 110 U/L — ABNORMAL HIGH (ref 15–41)
Alkaline Phosphatase: 390 U/L — ABNORMAL HIGH (ref 38–126)
Anion gap: 7 (ref 5–15)
BUN: 21 mg/dL — AB (ref 6–20)
CALCIUM: 8.5 mg/dL — AB (ref 8.9–10.3)
CO2: 25 mmol/L (ref 22–32)
Chloride: 104 mmol/L (ref 101–111)
Creatinine, Ser: 0.82 mg/dL (ref 0.61–1.24)
GFR calc non Af Amer: >60 mL/min (ref >60)
Glucose, Bld: 165 mg/dL — ABNORMAL HIGH (ref 70–99)
Potassium: 3.7 mmol/L (ref 3.5–5.1)
Sodium: 136 mmol/L (ref 135–145)
TOTAL PROTEIN: 6 g/dL — AB (ref 6.5–8.1)
Total Bilirubin: 10.5 mg/dL — ABNORMAL HIGH (ref 0.3–1.2)

## 2015-03-10 LAB — GLUCOSE, CAPILLARY
GLUCOSE-CAPILLARY: 163 mg/dL — AB (ref 70–99)
GLUCOSE-CAPILLARY: 153 mg/dL — AB (ref 70–99)
Glucose-Capillary: 154 mg/dL — ABNORMAL HIGH (ref 70–99)
Glucose-Capillary: 160 mg/dL — ABNORMAL HIGH (ref 70–99)
Glucose-Capillary: 151 mg/dL — ABNORMAL HIGH (ref 70–99)
Glucose-Capillary: 134 mg/dL — ABNORMAL HIGH (ref 70–99)
Glucose-Capillary: 179 mg/dL — ABNORMAL HIGH (ref 70–99)

## 2015-03-10 LAB — CBC
HEMATOCRIT: 30.6 % — AB (ref 39.0–52.0)
Hemoglobin: 10.5 g/dL — ABNORMAL LOW (ref 13.0–17.0)
MCH: 28.9 pg (ref 26.0–34.0)
MCHC: 34.3 g/dL (ref 30.0–36.0)
MCV: 84.3 fL (ref 78.0–100.0)
Platelets: 367 10*3/uL (ref 150–400)
RBC: 3.63 MIL/uL — AB (ref 4.22–5.81)
RDW: 14.3 % (ref 11.5–15.5)
WBC: 14.1 10*3/uL — AB (ref 4.0–10.5)

## 2015-03-10 LAB — PROCALCITONIN: Procalcitonin: 3.34 ng/mL

## 2015-03-10 SURGERY — ERCP, WITH INTERVENTION IF INDICATED
Anesthesia: General

## 2015-03-10 SURGERY — ERCP, WITH INTERVENTION IF INDICATED
Anesthesia: Monitor Anesthesia Care

## 2015-03-10 MED ORDER — HEPARIN SODIUM (PORCINE) 5000 UNIT/ML IJ SOLN: 5000.0000 [IU] | Freq: Three times a day (TID) | INTRAMUSCULAR | Status: DC

## 2015-03-10 MED ORDER — LACTATED RINGERS IV SOLN
INTRAVENOUS | Status: DC | PRN
Start: 2015-03-10 — End: 2015-03-10
  Administered 2015-03-10: 16:00:00 via INTRAVENOUS

## 2015-03-10 MED ORDER — PROMETHAZINE HCL 25 MG/ML IJ SOLN: 6.2500 mg | INTRAMUSCULAR | Status: DC | PRN

## 2015-03-10 MED ORDER — FENTANYL CITRATE (PF) 100 MCG/2ML IJ SOLN
INTRAMUSCULAR | Status: DC | PRN
  Administered 2015-03-10 (×2): 50 ug via INTRAVENOUS

## 2015-03-10 MED ORDER — MEPERIDINE HCL 25 MG/ML IJ SOLN: 6.2500 mg | INTRAMUSCULAR | Status: DC | PRN

## 2015-03-10 MED ORDER — ONDANSETRON HCL 4 MG/2ML IJ SOLN
INTRAMUSCULAR | Status: DC | PRN
  Administered 2015-03-10: 4 mg via INTRAVENOUS

## 2015-03-10 MED ORDER — SODIUM CHLORIDE 0.9 % IV SOLN: INTRAVENOUS | Status: DC

## 2015-03-10 MED ORDER — LIDOCAINE HCL (CARDIAC) 20 MG/ML IV SOLN
INTRAVENOUS | Status: AC
  Filled 2015-03-10: qty 5

## 2015-03-10 MED ORDER — ROCURONIUM BROMIDE 100 MG/10ML IV SOLN
INTRAVENOUS | Status: DC | PRN
  Administered 2015-03-10: 5 mg via INTRAVENOUS

## 2015-03-10 MED ORDER — PROPOFOL 10 MG/ML IV BOLUS
INTRAVENOUS | Status: AC
  Filled 2015-03-10: qty 20

## 2015-03-10 MED ORDER — IOHEXOL 350 MG/ML SOLN
INTRAVENOUS | Status: DC | PRN
  Administered 2015-03-10: 125 mL

## 2015-03-10 MED ORDER — FENTANYL CITRATE (PF) 100 MCG/2ML IJ SOLN
INTRAMUSCULAR | Status: AC
  Filled 2015-03-10: qty 2

## 2015-03-10 MED ORDER — ONDANSETRON HCL 4 MG/2ML IJ SOLN
INTRAMUSCULAR | Status: AC
  Filled 2015-03-10: qty 2

## 2015-03-10 MED ORDER — ALBUTEROL SULFATE (2.5 MG/3ML) 0.083% IN NEBU
INHALATION_SOLUTION | RESPIRATORY_TRACT | Status: AC
  Filled 2015-03-10: qty 3

## 2015-03-10 MED ORDER — LIDOCAINE HCL (CARDIAC) 20 MG/ML IV SOLN
INTRAVENOUS | Status: DC | PRN
  Administered 2015-03-10: 25 mg via INTRATRACHEAL
  Administered 2015-03-10: 75 mg via INTRAVENOUS

## 2015-03-10 MED ORDER — LABETALOL HCL 5 MG/ML IV SOLN
INTRAVENOUS | Status: DC | PRN
  Administered 2015-03-10 (×2): 5 mg via INTRAVENOUS

## 2015-03-10 MED ORDER — HEPARIN SODIUM (PORCINE) 5000 UNIT/ML IJ SOLN
5000.0000 [IU] | Freq: Three times a day (TID) | INTRAMUSCULAR | Status: DC
  Administered 2015-03-10 – 2015-03-17 (×19): 5000 [IU] via SUBCUTANEOUS
  Filled 2015-03-10 (×22): qty 1

## 2015-03-10 MED ORDER — SUCCINYLCHOLINE CHLORIDE 20 MG/ML IJ SOLN
INTRAMUSCULAR | Status: DC | PRN
  Administered 2015-03-10: 100 mg via INTRAVENOUS

## 2015-03-10 MED ORDER — PROPOFOL 10 MG/ML IV BOLUS
INTRAVENOUS | Status: DC | PRN
  Administered 2015-03-10: 200 mg via INTRAVENOUS

## 2015-03-10 NOTE — Progress Notes (Signed)
Justin Cohen 1:37 PM  Subjective: Patient with increasing liver tests still with pain and still with fever status post ERCP on Sunday and his case was discussed with Dr. Deatra Ina and Dr. Oletta Lamas and our office computer chart and hospital computer chart was reviewed and he has no new complaints  Objective: Vital signs stable low-grade temperature exam please see preassessment evaluation labs and ERCP reviewed Assessment: Questionable residual CBD stones versus stricture  Plan: Okay to proceed with repeat ERCP and possible stent with anesthesia assistance  [facility] E

## 2015-03-10 NOTE — Anesthesia Procedure Notes (Signed)
Procedure Name: Intubation Date/Time: 03/10/2015 3:45 PM Performed by: Lissa Morales Pre-anesthesia Checklist: Patient identified, Emergency Drugs available, Suction available and Patient being monitored Patient Re-evaluated:Patient Re-evaluated prior to inductionOxygen Delivery Method: Circle System Utilized Preoxygenation: Pre-oxygenation with 100% oxygen Intubation Type: IV induction Ventilation: Mask ventilation without difficulty Laryngoscope Size: Mac and 4 Grade View: Grade II Tube type: Oral Tube size: 8.0 mm Number of attempts: 1 Airway Equipment and Method: Stylet and Oral airway Placement Confirmation: ETT inserted through vocal cords under direct vision,  positive ETCO2 and breath sounds checked- equal and bilateral Secured at: 21 cm Tube secured with: Tape Dental Injury: Teeth and Oropharynx as per pre-operative assessment

## 2015-03-10 NOTE — Progress Notes (Signed)
[id number]/repeat ERCP due to poss. Retained stone and elevated lft's.  Will continue to follow.

## 2015-03-10 NOTE — Transfer of Care (Signed)
Immediate Anesthesia Transfer of Care Note  Patient: Justin Cohen  Procedure(s) Performed: Procedure(s): ENDOSCOPIC RETROGRADE CHOLANGIOPANCREATOGRAPHY (ERCP) (N/A)  Patient Location: PACU  Anesthesia Type:General  Level of Consciousness: awake, alert , oriented and patient cooperative  Airway & Oxygen Therapy: Patient Spontanous Breathing and Patient connected to face mask oxygen  Post-op Assessment: Report given to RN, Post -op Vital signs reviewed and stable and Patient moving all extremities X 4  Post vital signs: stable  Last Vitals:  Filed Vitals:   03/10/15 1725  BP:   Pulse: 96  Temp: 38.4 C  Resp: 37    Complications: No apparent anesthesia complications

## 2015-03-10 NOTE — Progress Notes (Signed)
[facility] PROGRESS NOTE Subjective Pt still with RUQ pain no better or worse that yesterday.  Objective: Vital signs in last 24 hours: Temp:  [99.1 F (37.3 C)-100.2 F (37.9 C)] 99.3 F (37.4 C) (05/03 0400) Pulse Rate:  [77-103] 99 (05/03 0420) Resp:  [22-29] 27 (05/03 0420) BP: (139-154)/(53-61) 150/56 mmHg (05/03 0420) SpO2:  [89 %-99 %] 99 % (05/03 0617) Weight:  [130.8 kg (288 lb 5.8 oz)] 130.8 kg (288 lb 5.8 oz) (05/03 0400) Last BM Date: 03/06/15  Intake/Output from previous day: 05/02 0701 - 05/03 0700 In: 2737.5 [P.O.:300; I.V.:2300; IV Piggyback:137.5] Out: 1125 [Urine:1125] Intake/Output this shift:    PE: General--obese male no distress  Lungs--clear Abdomen--distended but soft mild RUQ tenderness, +BSs  Lab Results:  Recent Labs  03/09/15 0347 03/10/15 0346  WBC 13.4* 14.1*  HGB 11.2* 10.5*  HCT 32.9* 30.6*  PLT 346 367   BMET  Recent Labs  03/09/15 0347 03/10/15 0346  NA 140 136  K 3.9 3.7  CL 104 104  CO2 27 25  CREATININE 0.82 0.82   LFT  Recent Labs  03/09/15 0347 03/10/15 0346  PROT 6.6 6.0*  AST 179* 110*  ALT 166* 129*  ALKPHOS 452* 390*  BILITOT 7.9* 10.5*   PT/INR No results for input(s): LABPROT, INR in the last 72 hours. PANCREAS  Recent Labs  03/10/15 0346  LIPASE 15*         Studies/Results: Dg Ercp With Sphincterotomy  03/08/2015   CLINICAL DATA:  Cholangitis, biliary dilatation, and choledocholithiasis.  EXAM: ERCP  TECHNIQUE: Multiple spot images obtained with the fluoroscopic device and submitted for interpretation post-procedure.  COMPARISON:  None.  FINDINGS: Contrast injection into the distal common bile duct shows mild diffuse biliary ductal dilatation, as well as several irregular filling defects within the distal common bile duct, consistent with choledocholithiasis.  Subsequent images show passage of a balloon extraction catheter. Final images show no definite residual filling defects  within the common bile duct.  IMPRESSION: Mild biliary ductal dilatation and distal common bile duct calculi. No definite residual biliary calculi seen following passage of balloon extraction catheter.  These images were submitted for radiologic interpretation only. Please see the procedural report for the amount of contrast and the fluoroscopy time utilized.   Electronically Signed   By: Earle Gell M.D.   On: 03/08/2015 13:58    Medications: I have reviewed the patient's current medications.  Assessment/Plan: 1. RUQ Pain/Jaundice. Concerned about rising TB 2 days after sphincterotomy. Concerned about obstructed CBD. ? Need for repeat cholangiogram  Discussed with Dr Watt Climes. Will proceed with repeat ERCP at aroungd 1:30 and have discussed with pt and wife. ? Extend sphincterotomy and stent placement.   Sebasthian Stailey JR,Lenix Benoist L 03/10/2015, 8:05 AM  Pager: [phone] If no answer or after hours call [phone]

## 2015-03-10 NOTE — Progress Notes (Signed)
PROGRESS NOTE  KELON EASOM LGX:[id number] DOB: 02/14/1946 DOA: 03/06/2015 PCP: No primary care provider on file.  Brief history This 69 year old male with a history of diabetes mellitus, hypertension, hyperlipidemia, and obstructive sleep apnea presented with 2-3 day history of right upper quadrant and epigastric abdominal pain and fever. The patient was at his orthopedist office on 03/06/2015 when he developed confusion and fever. He was sent to the emergency department. In the emergency department, the patient was noted to be relatively hypotensive with a blood pressure 94/74. He had a fever up to 103.71F. Lactic acid was 3.81. The patient was started on intravenous antibiotics and intravenous fluids. Abdominal ultrasound revealed gallbladder sludge with dilated common bile duct up to 10 mm. CT of the abdomen and pelvis showed cholelithiasis with dilated common bile duct and intrahepatic ductal dilatation. Gastroenterology was consulted, and ERCP is done 5/1 with a stone removals and sphincterotomy, patient continues to have rising total bilirubin, plan is to repeat ERCP 03/10/15 Assessment/Plan:  Sepsis -Present at the time of admission, -Secondary to Escherichia coli bacteremia from cholangitis secondary to choledocholithiasis. -Continue intravenous Zosyn 4/30, vancomycin stopped 4/30. -Blood cultures Escherichia coli, as if to Zosyn - Patient with recent left total knee replacement, well monitor closely for signs of infection, no erythema or swelling.  Cholangitis/cholelithiasis -ERCP is 5/1, with stone removal -Continue intravenous Zosyn 4/30 -Additional surgical consult appreciated, will eventually need cholecystectomy once LFTs started trending down. - Total bili  increasing today, so plan to repeat ERCP today to evaluate for CBD obstruction.  Elevated LFTs - Secondary to cholangitis, monitor closely. See discussion as above  Acute encephalopathy -Improved -Back to  baseline -Secondary to infectious process  Diabetes mellitus type 2 - NovoLog sliding scale, and low dose Lantus ( 4 units subcutaneous daily) -Continue to hold metformin  Hypertension -Initially on hold as patient was hypotensive, and septic  -Acceptable on when necessary hydralazine.   Pulmonary nodules -Noted incidentally on CT in the left lower lobe -Will need outpatient follow-up  DVT prophylaxis: Subcutaneous heparin  Family Communication:  Wife at beside Disposition Plan:  Remains in stepdown, home when stable   Time spent 30 minutes    Procedures/Studies: Ct Head Wo Contrast  03/06/2015   CLINICAL DATA:  Altered mental status started today.  Fever.  EXAM: CT HEAD WITHOUT CONTRAST  TECHNIQUE: Contiguous axial images were obtained from the base of the skull through the vertex without intravenous contrast.  COMPARISON:  None.  FINDINGS: There is mild central and cortical atrophy. Periventricular white matter change is consistent with small vessel disease. There is no intra or extra-axial fluid collection or mass lesion. The basilar cisterns and ventricles have a normal appearance. There is no CT evidence for acute infarction or hemorrhage. Bone windows are unremarkable.  IMPRESSION: 1. Atrophy and small vessel disease. 2.  No evidence for acute intracranial  abnormality.   Electronically Signed   By: Nolon Nations M.D.   On: 03/06/2015 21:52   US Abdomen Complete  03/06/2015   CLINICAL DATA:  Fever right abdominal pain for 2 days concern for cholangitis  EXAM: ULTRASOUND ABDOMEN COMPLETE  COMPARISON:  None.  FINDINGS: Gallbladder: Nonmobile sludge with tiny echogenic foci possibly representing calculi. No wall thickening.  Common bile duct: Diameter: Measures up to about 1 cm. There is echogenic material within the duct.  Liver: Heterogeneous echotexture with no focal abnormalities. Probable fatty infiltration.  IVC: Not seen well  Pancreas:  Not seen well  Spleen: Size and  appearance within normal limits.  Right Kidney: Length: 13 cm. Echogenicity within normal limits. No mass or hydronephrosis visualized.  Left Kidney: Length: 14 cm. Echogenicity within normal limits. No mass or hydronephrosis visualized.  Abdominal aorta: Obscured by bowel gas  Other findings: None.  IMPRESSION: Dilated common bile duct. Distal obstructing lesion not excluded. Echogenic material within the duct suggesting sludge.  Abundant gallbladder sludge.   Electronically Signed   By: Skipper Cliche M.D.   On: 03/06/2015 21:04   Ct Abdomen Pelvis W Contrast  03/07/2015   CLINICAL DATA:  Acute onset of generalized abdominal pain. Initial encounter.  EXAM: CT ABDOMEN AND PELVIS WITH CONTRAST  TECHNIQUE: Multidetector CT imaging of the abdomen and pelvis was performed using the standard protocol following bolus administration of intravenous contrast.  CONTRAST:  173mL OMNIPAQUE IOHEXOL 300 MG/ML  SOLN  COMPARISON:  Abdominal ultrasound performed earlier today at 8:25 p.m.  FINDINGS: Multiple scattered small nodules are noted at the left lower lobe, measuring up to 6 mm in size. Given multiplicity of nodules, these are thought to be postinfectious in nature.  A few stones are seen dependently within the gallbladder. The gallbladder is mildly distended. The common bile duct is dilated to 1.0 cm in diameter, with intrahepatic biliary ductal dilatation, and suggestion of multiple small stones within the distal common bile duct. The liver and spleen are otherwise unremarkable. The pancreas and adrenal glands are unremarkable.  Scattered small right renal cysts are noted. These measure up to 1.4 cm in size. Nonspecific perinephric stranding and fluid are noted bilaterally. The left kidney is otherwise unremarkable. No renal or ureteral stones are identified. There is no evidence of hydronephrosis.  No free fluid is identified. The small bowel is unremarkable in appearance. The stomach is within normal limits. No  acute vascular abnormalities are seen. Scattered calcification is seen along the abdominal aorta and its branches.  The appendix is normal in caliber, without evidence of appendicitis. The colon is unremarkable in appearance.  The bladder is decompressed and not well assessed. The patient is status post prostatectomy. Small bilateral inguinal hernias are seen, containing only fat. No inguinal lymphadenopathy is seen.  No acute osseous abnormalities are identified. Multilevel vacuum phenomenon is noted along the lumbar spine. Underlying facet disease is noted.  IMPRESSION: 1. Cholelithiasis, with mild distention of the gallbladder, dilatation of the common bile duct to 1.0 cm in diameter, and intrahepatic biliary duct dilatation. Suggestion of multiple small stones within the distal common bile duct. Findings raise concern for distal obstruction. ERCP or MRCP could be performed for further evaluation. 2. Scattered small right renal cyst noted. 3. Scattered calcification along the abdominal aorta and its branches. 4. Small bilateral inguinal hernias, containing only fat. 5. Minimal degenerative change along the lumbar spine. 6. Multiple scattered small nodules at the left lower lung lobe, measuring up to 6 mm. Given multiplicity of nodules, these are most likely postinfectious in nature. However, if the patient is at high risk for bronchogenic carcinoma, follow-up chest CT at 6-12 months is recommended. If the patient is at low risk for bronchogenic carcinoma, follow-up chest CT at 12 months is recommended. This recommendation follows the consensus statement: Guidelines for Management of Small Pulmonary Nodules Detected on CT Scans: A Statement from the [employer] as published in Radiology 2005;237:395-400.   Electronically Signed   By: Garald Balding M.D.   On: 03/07/2015 00:43   Dg Chest [facility]  03/06/2015   CLINICAL DATA:  Two day history of fever  EXAM: PORTABLE CHEST - 1 VIEW  COMPARISON:  None.   FINDINGS: There is patchy infiltrate in the left lower lobe. Lungs elsewhere clear. Heart is upper normal in size with pulmonary vascularity within normal limits. No adenopathy. No bone lesions.  IMPRESSION: Patchy infiltrate left lower lobe.   Electronically Signed   By: Lowella Grip III M.D.   On: 03/06/2015 20:08   Dg Ercp With Sphincterotomy  03/08/2015   CLINICAL DATA:  Cholangitis, biliary dilatation, and choledocholithiasis.  EXAM: ERCP  TECHNIQUE: Multiple spot images obtained with the fluoroscopic device and submitted for interpretation post-procedure.  COMPARISON:  None.  FINDINGS: Contrast injection into the distal common bile duct shows mild diffuse biliary ductal dilatation, as well as several irregular filling defects within the distal common bile duct, consistent with choledocholithiasis.  Subsequent images show passage of a balloon extraction catheter. Final images show no definite residual filling defects within the common bile duct.  IMPRESSION: Mild biliary ductal dilatation and distal common bile duct calculi. No definite residual biliary calculi seen following passage of balloon extraction catheter.  These images were submitted for radiologic interpretation only. Please see the procedural report for the amount of contrast and the fluoroscopy time utilized.   Electronically Signed   By: Earle Gell M.D.   On: 03/08/2015 13:58        Subjective: Still complaining of abdominal pain, no change,. Denies any chest pain, shortness breath, coughing, hemoptysis, vomiting, diarrhea, dysuria, hematochezia. Denies any fevers or chills.  Objective: Filed Vitals:   03/10/15 0400 03/10/15 0420 03/10/15 0617 03/10/15 0800  BP:  150/56  116/46  Pulse:  99  106  Temp: 99.3 F (37.4 C)   99.3 F (37.4 C)  TempSrc: Oral   Oral  Resp:  27  26  Height:      Weight: 130.8 kg (288 lb 5.8 oz)     SpO2:  98% 99% 99%    Intake/Output Summary (Last 24 hours) at 03/10/15 1139 Last data filed  at 03/10/15 0800  Gross per 24 hour  Intake   2525 ml  Output    825 ml  Net   1700 ml   Weight change: 4.4 kg (9 lb 11.2 oz) Exam:   General:  Pt is alert, follows commands appropriately, not in acute distress  HEENT: No icterus, No thrush,[place]/AT  Cardiovascular: RRR, S1/S2, no rubs, no gallops  Respiratory: CTA bilaterally, no wheezing, no crackles, no rhonchi  Abdomen: Soft/+BS, mild tenderness in right upper quadrant, , no guarding  Extremities: trace edema, No lymphangitis, No petechiae, No rashes, no synovitis;  left knee incision site without any erythema or drainage,   Data Reviewed: Basic Metabolic Panel:  Recent Labs Lab 03/06/15 1818 03/07/15 0514 03/09/15 0347 03/10/15 0346  NA 134* 137 140 136  K 3.6 4.3 3.9 3.7  CL 98 101 104 104  CO2 21 23 27 25   GLUCOSE 165* 170* 204* 165*  BUN 24* 31* 23* 21*  CREATININE 1.23 1.40* 0.82 0.82  CALCIUM 9.6 8.8 9.1 8.5*   Liver Function Tests:  Recent Labs Lab 03/06/15 1818 03/07/15 0514 03/09/15 0347 03/10/15 0346  AST 271* 221* 179* 110*  ALT 223* 205* 166* 129*  ALKPHOS 309* 244* 452* 390*  BILITOT 7.5* 7.7* 7.9* 10.5*  PROT 7.6 6.4 6.6 6.0*  ALBUMIN 3.7 3.0* 2.8* 2.5*    Recent Labs Lab 03/06/15 2354 03/10/15 0346  LIPASE 15  15*   No results for input(s): AMMONIA in the last 168 hours. CBC:  Recent Labs Lab 03/06/15 1818 03/07/15 0514 03/09/15 0347 03/10/15 0346  WBC 6.1 12.6* 13.4* 14.1*  NEUTROABS 5.7 11.2*  --   --   HGB 12.8* 11.0* 11.2* 10.5*  HCT 37.9* 33.9* 32.9* 30.6*  MCV 86.7 86.9 85.7 84.3  PLT 441* 401* 346 367   Cardiac Enzymes:  Recent Labs Lab 03/06/15 2354 03/07/15 0514 03/07/15 1206  TROPONINI 0.04* 0.04* 0.03   BNP: Invalid input(s): POCBNP CBG:  Recent Labs Lab 03/09/15 1452 03/09/15 2002 03/09/15 2332 03/10/15 0315 03/10/15 0733  GLUCAP 212* 154* 160* 163* 151*    Recent Results (from the past 240 hour(s))  Urine culture     Status: None    Collection Time: 03/06/15  6:00 PM  Result Value Ref Range Status   Specimen Description URINE, CLEAN CATCH  Final   Special Requests NONE  Final   Colony Count   Final    6,000 COLONIES/ML Performed at [facility]    Culture   Final    INSIGNIFICANT GROWTH Performed at [facility]    Report Status 03/07/2015 FINAL  Final  Blood Culture (routine x 2)     Status: None   Collection Time: 03/06/15  6:18 PM  Result Value Ref Range Status   Specimen Description BLOOD LEFT ARM  Final   Special Requests BOTTLES DRAWN AEROBIC AND ANAEROBIC 5CC  Final   Culture   Final    ESCHERICHIA COLI Note: Gram Stain Report Called to,Read Back By and Verified With: ANGELA ASHLEY BY INGRAM A 4/30 1020AM Performed at [facility]    Report Status 03/09/2015 FINAL  Final   Organism ID, Bacteria ESCHERICHIA COLI  Final      Susceptibility   Escherichia coli - MIC*    AMPICILLIN >=32 RESISTANT Resistant     AMPICILLIN/SULBACTAM >=32 RESISTANT Resistant     CEFAZOLIN <=4 SENSITIVE Sensitive     CEFEPIME <=1 SENSITIVE Sensitive     CEFTAZIDIME <=1 SENSITIVE Sensitive     CEFTRIAXONE <=1 SENSITIVE Sensitive     CIPROFLOXACIN >=4 RESISTANT Resistant     GENTAMICIN >=16 RESISTANT Resistant     IMIPENEM <=0.25 SENSITIVE Sensitive     PIP/TAZO <=4 SENSITIVE Sensitive     TOBRAMYCIN 8 INTERMEDIATE Intermediate     TRIMETH/SULFA <=20 SENSITIVE Sensitive     * ESCHERICHIA COLI  Blood Culture (routine x 2)     Status: None   Collection Time: 03/06/15  6:19 PM  Result Value Ref Range Status   Specimen Description BLOOD RIGHT ARM  Final   Special Requests BOTTLES DRAWN AEROBIC AND ANAEROBIC 5CC  Final   Culture   Final    ESCHERICHIA COLI Note: SUSCEPTIBILITIES PERFORMED ON PREVIOUS CULTURE WITHIN THE LAST 5 DAYS. Note: Gram Stain Report Called to,Read Back By and Verified With: ANGELA ASHLEY BY INGRAM A 4/30 1020AM Performed at [facility]    Report Status 03/09/2015  FINAL  Final  MRSA PCR Screening     Status: None   Collection Time: 03/07/15  5:49 AM  Result Value Ref Range Status   MRSA by PCR NEGATIVE NEGATIVE Final    Comment:        The GeneXpert MRSA Assay (FDA approved for NASAL specimens only), is one component of a comprehensive MRSA colonization surveillance program. It is not intended to diagnose MRSA infection nor to guide or monitor treatment for MRSA  infections.      Scheduled Meds: . antiseptic oral rinse  7 mL Mouth Rinse BID  . dorzolamide-timolol  1 drop Both Eyes BID  . feeding supplement (PRO-STAT SUGAR FREE 64)  30 mL Oral BID  . feeding supplement (RESOURCE BREEZE)  1 Container Oral BID BM  . fluticasone  1 spray Each Nare Daily  . heparin subcutaneous  5,000 Units Subcutaneous 3 times per day  . indomethacin  100 mg Rectal Once  . insulin aspart  0-9 Units Subcutaneous 6 times per day  . insulin glargine  4 Units Subcutaneous Daily  . piperacillin-tazobactam  3.375 g Intravenous 3 times per day  . venlafaxine XR  75 mg Oral Daily   Continuous Infusions: . sodium chloride    . sodium chloride 0.9 % 1,000 mL with potassium chloride 20 mEq infusion 100 mL/hr at 03/10/15 0600   Anti-infectives    Start     Dose/Rate Route Frequency Ordered Stop   03/07/15 1800  vancomycin (VANCOCIN) IVPB 1000 mg/200 mL premix  Status:  Discontinued     1,000 mg 200 mL/hr over 60 Minutes Intravenous Every 12 hours 03/07/15 0551 03/07/15 1524   03/07/15 0600  piperacillin-tazobactam (ZOSYN) IVPB 3.375 g     3.375 g 12.5 mL/hr over 240 Minutes Intravenous 3 times per day 03/07/15 0449     03/07/15 0530  vancomycin (VANCOCIN) 2,500 mg in sodium chloride 0.9 % 500 mL IVPB     2,500 mg 250 mL/hr over 120 Minutes Intravenous  Once 03/07/15 0456 03/07/15 0749   03/06/15 2315  piperacillin-tazobactam (ZOSYN) IVPB 3.375 g  Status:  Discontinued     3.375 g 100 mL/hr over 30 Minutes Intravenous 3 times per day 03/06/15 2309 03/07/15 0449     03/06/15 2100  imipenem-cilastatin (PRIMAXIN) 500 mg in sodium chloride 0.9 % 100 mL IVPB     500 mg 200 mL/hr over 30 Minutes Intravenous  Once 03/06/15 2008 03/06/15 2130   03/06/15 1900  cefTRIAXone (ROCEPHIN) 1 g in dextrose 5 % 50 mL IVPB     1 g 100 mL/hr over 30 Minutes Intravenous NOW 03/06/15 1858 03/06/15 1934        Phillips Climes, MD  [facility] Pager [phone]  If 7PM-7AM, please contact night-coverage [email or web address] Password [email or web address] 03/10/2015, 11:39 AM   LOS: 4 days

## 2015-03-10 NOTE — Anesthesia Postprocedure Evaluation (Signed)
  Anesthesia Post-op Note  Patient: Justin Cohen  Procedure(s) Performed: Procedure(s) (LRB): ENDOSCOPIC RETROGRADE CHOLANGIOPANCREATOGRAPHY (ERCP) (N/A)  Patient Location: PACU  Anesthesia Type: General  Level of Consciousness: awake and alert   Airway and Oxygen Therapy: Patient Spontanous Breathing  Post-op Pain: mild  Post-op Assessment: Post-op Vital signs reviewed, Patient's Cardiovascular Status Stable, Respiratory Function Stable, Patent Airway and No signs of Nausea or vomiting  Last Vitals:  Filed Vitals:   03/10/15 2100  BP: 165/76  Pulse: 102  Temp:   Resp: 24    Post-op Vital Signs: stable   Complications: No apparent anesthesia complications

## 2015-03-10 NOTE — Anesthesia Preprocedure Evaluation (Signed)
Anesthesia Evaluation  Patient identified by MRN, date of birth, ID band Patient awake    Reviewed: Allergy & Precautions, NPO status , Patient's Chart, lab work & pertinent test results, reviewed documented beta blocker date and time   Airway Mallampati: III  TM Distance: >3 FB Neck ROM: Full    Dental no notable dental hx. (+) Teeth Intact   Pulmonary sleep apnea , pneumonia -, former smoker,  On droplet precautions- tests negative  breath sounds clear to auscultation  Pulmonary exam normal       Cardiovascular hypertension, Pt. on medications Rhythm:Regular Rate:Normal     Neuro/Psych negative neurological ROS  negative psych ROS   GI/Hepatic Neg liver ROS, Cholangitis Elevated LFT's Choledocholithiasis Cholelithiasis   Endo/Other  diabetes, Poorly Controlled, Type 2, Oral Hypoglycemic AgentsMorbid obesity  Renal/GU negative Renal ROS  negative genitourinary   Musculoskeletal  (+) Arthritis -, Osteoarthritis,  Recent Left TKR 02/17/2015 under [facility]   Abdominal (+) + obese,   Peds  Hematology  (+) anemia , Elevated PT   Anesthesia Other Findings   Reproductive/Obstetrics negative OB ROS                             Anesthesia Physical  Anesthesia Plan  ASA: III  Anesthesia Plan: General   Post-op Pain Management:    Induction: Intravenous  Airway Management Planned: Oral ETT  Additional Equipment:   Intra-op Plan:   Post-operative Plan: Extubation in OR  Informed Consent: I have reviewed the patients History and Physical, chart, labs and discussed the procedure including the risks, benefits and alternatives for the proposed anesthesia with the patient or authorized representative who has indicated his/her understanding and acceptance.   Dental advisory given  Plan Discussed with: CRNA, Anesthesiologist and Surgeon  Anesthesia Plan Comments:         Anesthesia  Quick Evaluation

## 2015-03-10 NOTE — Progress Notes (Signed)
ANTIBIOTIC CONSULT NOTE - Follow Up  Pharmacy Consult for Zosyn Indication: Sepsis  No Known Allergies  Patient Measurements: Height: 5\' 8"  (172.7 cm) Weight: 288 lb 5.8 oz (130.8 kg) IBW/kg (Calculated) : 68.4   Vital Signs: Temp: 99.3 F (37.4 C) (05/03 0800) Temp Source: Oral (05/03 0800) BP: 116/46 mmHg (05/03 0800) Pulse Rate: 106 (05/03 0800) Intake/Output from previous day: 05/02 0701 - 05/03 0700 In: 2837.5 [P.O.:300; I.V.:2400; IV Piggyback:137.5] Out: 1175 [Urine:1175] Intake/Output from this shift: Total I/O In: 100 [I.V.:100] Out: -   Labs:  Recent Labs  03/09/15 0347 03/10/15 0346  WBC 13.4* 14.1*  HGB 11.2* 10.5*  PLT 346 367  CREATININE 0.82 0.82   Estimated Creatinine Clearance: 110.7 mL/min (by C-G formula based on Cr of 0.82). No results for input(s): VANCOTROUGH, VANCOPEAK, VANCORANDOM, GENTTROUGH, GENTPEAK, GENTRANDOM, TOBRATROUGH, TOBRAPEAK, TOBRARND, AMIKACINPEAK, AMIKACINTROU, AMIKACIN in the last 72 hours.   Microbiology: Recent Results (from the past 720 hour(s))  Urine culture     Status: None   Collection Time: 03/06/15  6:00 PM  Result Value Ref Range Status   Specimen Description URINE, CLEAN CATCH  Final   Special Requests NONE  Final   Colony Count   Final    6,000 COLONIES/ML Performed at [facility]    Culture   Final    INSIGNIFICANT GROWTH Performed at [facility]    Report Status 03/07/2015 FINAL  Final  Blood Culture (routine x 2)     Status: None   Collection Time: 03/06/15  6:18 PM  Result Value Ref Range Status   Specimen Description BLOOD LEFT ARM  Final   Special Requests BOTTLES DRAWN AEROBIC AND ANAEROBIC 5CC  Final   Culture   Final    ESCHERICHIA COLI Note: Gram Stain Report Called to,Read Back By and Verified With: ANGELA ASHLEY BY INGRAM A 4/30 1020AM Performed at [facility]    Report Status 03/09/2015 FINAL  Final   Organism ID, Bacteria ESCHERICHIA COLI  Final   Susceptibility   Escherichia coli - MIC*    AMPICILLIN >=32 RESISTANT Resistant     AMPICILLIN/SULBACTAM >=32 RESISTANT Resistant     CEFAZOLIN <=4 SENSITIVE Sensitive     CEFEPIME <=1 SENSITIVE Sensitive     CEFTAZIDIME <=1 SENSITIVE Sensitive     CEFTRIAXONE <=1 SENSITIVE Sensitive     CIPROFLOXACIN >=4 RESISTANT Resistant     GENTAMICIN >=16 RESISTANT Resistant     IMIPENEM <=0.25 SENSITIVE Sensitive     PIP/TAZO <=4 SENSITIVE Sensitive     TOBRAMYCIN 8 INTERMEDIATE Intermediate     TRIMETH/SULFA <=20 SENSITIVE Sensitive     * ESCHERICHIA COLI  Blood Culture (routine x 2)     Status: None   Collection Time: 03/06/15  6:19 PM  Result Value Ref Range Status   Specimen Description BLOOD RIGHT ARM  Final   Special Requests BOTTLES DRAWN AEROBIC AND ANAEROBIC 5CC  Final   Culture   Final    ESCHERICHIA COLI Note: SUSCEPTIBILITIES PERFORMED ON PREVIOUS CULTURE WITHIN THE LAST 5 DAYS. Note: Gram Stain Report Called to,Read Back By and Verified With: ANGELA ASHLEY BY INGRAM A 4/30 1020AM Performed at [facility]    Report Status 03/09/2015 FINAL  Final  MRSA PCR Screening     Status: None   Collection Time: 03/07/15  5:49 AM  Result Value Ref Range Status   MRSA by PCR NEGATIVE NEGATIVE Final    Comment:  The GeneXpert MRSA Assay (FDA approved for NASAL specimens only), is one component of a comprehensive MRSA colonization surveillance program. It is not intended to diagnose MRSA infection nor to guide or monitor treatment for MRSA infections.     Medical History: Past Medical History  Diagnosis Date  . Diabetes mellitus without complication   . Hypertension   . Hyperlipidemia   . Cancer     prostate cancer  . Cancer     melanoma on nose  . Environmental allergies   . Sleep apnea     Medications:  Prescriptions prior to admission  Medication Sig Dispense Refill Last Dose  . amLODipine-benazepril (LOTREL) 5-10 MG per capsule Take 1 capsule by  mouth 2 (two) times daily.   03/06/2015 at 0900  . aspirin EC 325 MG tablet Take 325 mg by mouth 2 (two) times daily.   03/06/2015 at 0900  . cholecalciferol (VITAMIN D) 1000 UNITS tablet Take 1,000-2,000 Units by mouth daily. 2000 mg in the am and 1000 mg in the pm   03/06/2015 at 0900  . dorzolamide-timolol (COSOPT) 22.3-6.8 MG/ML ophthalmic solution Place 1 drop into both eyes 2 (two) times daily.    03/05/2015 at Unknown time  . fluticasone (FLONASE) 50 MCG/ACT nasal spray Place 1 spray into both nostrils every morning.   03/06/2015 at 0900  . glipiZIDE (GLUCOTROL) 5 MG tablet Take 5 mg by mouth every morning.   03/06/2015 at 0900  . HYDROcodone-acetaminophen (NORCO) 7.5-325 MG per tablet Take 1-2 tablets by mouth every 4 (four) hours as needed. For moderate pain   03/05/2015 at 2100  . loratadine (CLARITIN) 10 MG tablet Take 10 mg by mouth every morning.   03/06/2015 at Unknown time  . metFORMIN (GLUCOPHAGE) 1000 MG tablet Take 1,000 mg by mouth 2 (two) times daily.   03/06/2015 at 0900  . Multiple Vitamin (MULTIVITAMIN WITH MINERALS) TABS tablet Take 1 tablet by mouth every morning.   03/06/2015 at Unknown time  . Omega-3 Fatty Acids (FISH OIL) 1000 MG CAPS Take 1,000 mg by mouth every morning.   03/06/2015 at Unknown time  . pioglitazone (ACTOS) 45 MG tablet Take 45 mg by mouth every morning.   03/06/2015 at 0900  . PREVACID 15 MG capsule Take 15 mg by mouth every morning.   03/06/2015 at 0900  . rosuvastatin (CRESTOR) 5 MG tablet Take 5 mg by mouth at bedtime.   03/05/2015 at Unknown time  . tiZANidine (ZANAFLEX) 4 MG tablet Take 4 mg by mouth every 6 (six) hours as needed. Muscle spasms   03/05/2015 at 2100  . venlafaxine XR (EFFEXOR-XR) 75 MG 24 hr capsule Take 75 mg by mouth every morning.   03/06/2015 at 0900   Scheduled:  . antiseptic oral rinse  7 mL Mouth Rinse BID  . dorzolamide-timolol  1 drop Both Eyes BID  . feeding supplement (PRO-STAT SUGAR FREE 64)  30 mL Oral BID  . feeding supplement  (RESOURCE BREEZE)  1 Container Oral BID BM  . fluticasone  1 spray Each Nare Daily  . indomethacin  100 mg Rectal Once  . insulin aspart  0-9 Units Subcutaneous 6 times per day  . insulin glargine  4 Units Subcutaneous Daily  . piperacillin-tazobactam  3.375 g Intravenous 3 times per day  . venlafaxine XR  75 mg Oral Daily   Infusions:  . sodium chloride 0.9 % 1,000 mL with potassium chloride 20 mEq infusion 100 mL/hr at 03/10/15 0600   Assessment: 34 yoM s/p  left TKA 4/12, admitted 4/29 with fever and confusion.  Pharmacy was consulted to dose vancomycin and zosyn for presumed sepsis given hypotension and fever. CT of the abdomen and pelvis showed cholelithiasis with dilated common bile duct and intrahepatic ductal dilatation. Gastroenterology was consulted, and ERCP is done 5/1 with a stone removal. Blood cultures from 4/29 grew E.coli thus vancomycin was dc'd 4/30.  4/29 >> Rocephin and Primaxinx1 ED  4/30 >> Vancomycin >> 4/30 4/30 >> Zosyn >>  Goal of Therapy:  Zosyn dose appropriate for indication, renal function Eradication of infection  Plan:   Continue Zosyn 3.375gm IV q8h (4hr extended infusions) Follow up renal function & cultures, clinical course Can spectrum be narrowed to Ancef or Rocephin?  Peggyann Juba, PharmD, BCPS Pager: [phone] 03/10/2015,10:42 AM

## 2015-03-10 NOTE — Progress Notes (Signed)
PT Cancellation Note  Patient Details Name: Justin Cohen MRN: [id number] DOB: 04/03/1946   Cancelled Treatment:    Reason Eval/Treat Not Completed: Patient at procedure or test/unavailable   Annastyn Silvey,KATHrine E 03/10/2015, 1:53 PM Carmelia Bake, PT, DPT 03/10/2015 Pager: [phone]

## 2015-03-10 NOTE — Op Note (Signed)
[facility] [place], [address]   ERCP PROCEDURE REPORT  PATIENT: Justin Cohen, Justin Cohen  MR# :[id number] BIRTHDATE: Oct 01, 1946  GENDER: male ENDOSCOPIST: Clarene Essex, MD REFERRED BY: PROCEDURE DATE:  03/10/2015 PROCEDURE:   ERCP with sphincterotomy/papillotomy, ERCP with removal of calculus/calculi , ERCP with stent placement, and ERCP with balloon dilation ASA CLASS:    3 INDICATIONS: worsening jaundice status post ERCP MEDICATIONS:    Gen. anesthesia TOPICAL ANESTHETIC:  none  DESCRIPTION OF PROCEDURE:   After the risks benefits and alternatives of the procedure were thoroughly explained, informed consent was obtained.  The ercp pentax [id number]  endoscope was introduced through the mouth and advanced to the second portion of the duodenum . the previous sphincterotomy site and ampulla was brought into view and using the triple lumen sphincterotome loaded with the JAG Jagwire deep selective cannulation was obtained and there was no pancreatic duct injection or any pancreatic wire advancement throughout the procedure and contrast filled the duct after the wire was advanced into the intrahepatics and some probable stones were seen and we exchanged the sphincterotome after we enlarged the sphincterotomy site in the customary fashion until we had fairly adequate biliary drainage and could get the fully bowed sphincterotome easily in and out of the duct and then we exchanged the sphincterotome for the balloon catheter and on the first pull-through multiple tiny stones were removed and then we proceeded with 2 more balloon pull throughs using the 12 mmsize balloon without any further stones but some resistance and removing past the ampulla and an occlusion cholangiogram was normal unfortunately there was no drainage and we initially proceeded with a balloon dilation first using the 4 cm 34millimeterdilating balloon which popped halfway through the dilation  so we exchanged it for the 6 millimeter balloonand proceeded with dilation in the customary fashion we then exchanged the dilating balloon for the occlusion balloon and injected more dye but again it seemed to have sluggish drainage and the balloon still had some resistance in being removed so we elected to place a stent and we picked the double-pigtail 5 cm 10 French stent not wanting to use the standard stent with a moderately large sphincterotomy and it was placed in the customary fashion with adequate biliary drainage and the wire and introducer were removed Estimated blood loss is zero unless otherwise noted in this procedure report.the patient tolerated the procedure well there was no obvious immediate complication       COMPLICATIONS:   none  ENDOSCOPIC IMPRESSION:1. Normal ampullawith previous sphincterotomy site status post increased sphincterotomy as above 2. No pancreatic duct injections or wires advancement throughout the procedure 3. Few small stones removed4. Multiple negative balloon pull through's and negative occlusion cholangiogram 5. No obvious straining status post balloon dilation as above without better drainage status post stent as above   RECOMMENDATIONS:observe for delayed complications if none might proceed with a laparoscopic cholecystectomy sooner than later because no cystic duct filling and may have acute cholecystitis as well     _______________________________ eSigned:  Clarene Essex, MD 03/10/2015 5:29 PM   CC:  PATIENT NAME:  Justin Cohen, Justin Cohen MR#: [id number]

## 2015-03-10 NOTE — Progress Notes (Signed)
2 Days Post-Op  Subjective: He may be a little tender in the RUQ, but you have to really push to get him to acknowledge anything.   Objective: Vital signs in last 24 hours: Temp:  [98.9 F (37.2 C)-100.2 F (37.9 C)] 99.3 F (37.4 C) (05/03 0400) Pulse Rate:  [77-104] 99 (05/03 0420) Resp:  [22-29] 27 (05/03 0420) BP: (120-154)/(53-61) 150/56 mmHg (05/03 0420) SpO2:  [89 %-100 %] 99 % (05/03 0617) Weight:  [130.8 kg (288 lb 5.8 oz)] 130.8 kg (288 lb 5.8 oz) (05/03 0400) Last BM Date: 03/06/15 Diet: clears  300 PO recorded 1125 urine output Tm 100.2 yesterday at 11 AM, and 7 PM WBC still up 14.1, T bil up to 10.5, AST/ALT are better down to 110/129 Lipase 15   Intake/Output from previous day: 05/02 0701 - 05/03 0700 In: 2737.5 [P.O.:300; I.V.:2300; IV Piggyback:137.5] Out: 1125 [Urine:1125] Intake/Output this shift:    General appearance: alert, cooperative and no distress GI: soft, large abdomen, perhaps some tenderness RUQ but with effort.  + BS.  Lab Results:   Recent Labs  03/09/15 0347 03/10/15 0346  WBC 13.4* 14.1*  HGB 11.2* 10.5*  HCT 32.9* 30.6*  PLT 346 367    BMET  Recent Labs  03/09/15 0347 03/10/15 0346  NA 140 136  K 3.9 3.7  CL 104 104  CO2 27 25  GLUCOSE 204* 165*  BUN 23* 21*  CREATININE 0.82 0.82  CALCIUM 9.1 8.5*   PT/INR No results for input(s): LABPROT, INR in the last 72 hours.   Recent Labs Lab 03/06/15 1818 03/07/15 0514 03/09/15 0347 03/10/15 0346  AST 271* 221* 179* 110*  ALT 223* 205* 166* 129*  ALKPHOS 309* 244* 452* 390*  BILITOT 7.5* 7.7* 7.9* 10.5*  PROT 7.6 6.4 6.6 6.0*  ALBUMIN 3.7 3.0* 2.8* 2.5*     Lipase     Component Value Date/Time   LIPASE 15* 03/10/2015 0346     Studies/Results: Dg Ercp With Sphincterotomy  03/08/2015   CLINICAL DATA:  Cholangitis, biliary dilatation, and choledocholithiasis.  EXAM: ERCP  TECHNIQUE: Multiple spot images obtained with the fluoroscopic device and submitted for  interpretation post-procedure.  COMPARISON:  None.  FINDINGS: Contrast injection into the distal common bile duct shows mild diffuse biliary ductal dilatation, as well as several irregular filling defects within the distal common bile duct, consistent with choledocholithiasis.  Subsequent images show passage of a balloon extraction catheter. Final images show no definite residual filling defects within the common bile duct.  IMPRESSION: Mild biliary ductal dilatation and distal common bile duct calculi. No definite residual biliary calculi seen following passage of balloon extraction catheter.  These images were submitted for radiologic interpretation only. Please see the procedural report for the amount of contrast and the fluoroscopy time utilized.   Electronically Signed   By: Earle Gell M.D.   On: 03/08/2015 13:58    Medications: . antiseptic oral rinse  7 mL Mouth Rinse BID  . dorzolamide-timolol  1 drop Both Eyes BID  . feeding supplement (PRO-STAT SUGAR FREE 64)  30 mL Oral BID  . feeding supplement (RESOURCE BREEZE)  1 Container Oral BID BM  . fluticasone  1 spray Each Nare Daily  . heparin subcutaneous  5,000 Units Subcutaneous 3 times per day  . indomethacin  100 mg Rectal Once  . insulin aspart  0-9 Units Subcutaneous 6 times per day  . insulin glargine  4 Units Subcutaneous Daily  . piperacillin-tazobactam  3.375 g  Intravenous 3 times per day  . venlafaxine XR  75 mg Oral Daily    Assessment/Plan Sepsis with cholangitis, cholelithiasis, choledocholithiais S/p ERCP 03/08/15 Dr. Deatra Ina S/p left Knee replacement, 02/17/15 Hx of prostate cancer AODM Sleep Apnea on CPAP Body mass index is 42.38  LFT's still up, AST, ALT is better, alk Phos and T bil to 10.5 Day 5 Zosyn, Vancomycin x 1 dose 4/29. DVT: Heparin/SCD   Plan:  Will defer to GI on further evaluation of elevated LFT'S recheck in AM.  Some rales in bases, he has 2 orders for IS now, I don't see a device in his room.     LOS: 4 days    Samayra Hebel 03/10/2015

## 2015-03-11 ENCOUNTER — Inpatient Hospital Stay (HOSPITAL_COMMUNITY): Payer: Commercial Managed Care - HMO

## 2015-03-11 ENCOUNTER — Encounter (HOSPITAL_COMMUNITY): Payer: Self-pay | Admitting: Gastroenterology

## 2015-03-11 DIAGNOSIS — A4151 Sepsis due to Escherichia coli [E. coli]: Secondary | ICD-10-CM

## 2015-03-11 LAB — CBC
HCT: 32.3 % — ABNORMAL LOW (ref 39.0–52.0)
HEMOGLOBIN: 11.1 g/dL — AB (ref 13.0–17.0)
MCH: 28.9 pg (ref 26.0–34.0)
MCHC: 34.4 g/dL (ref 30.0–36.0)
MCV: 84.1 fL (ref 78.0–100.0)
Platelets: 362 10*3/uL (ref 150–400)
RBC: 3.84 MIL/uL — AB (ref 4.22–5.81)
RDW: 14.5 % (ref 11.5–15.5)
WBC: 14.2 10*3/uL — ABNORMAL HIGH (ref 4.0–10.5)

## 2015-03-11 LAB — COMPREHENSIVE METABOLIC PANEL
ALBUMIN: 2.5 g/dL — AB (ref 3.5–5.0)
ALK PHOS: 377 U/L — AB (ref 38–126)
ALT: 109 U/L — ABNORMAL HIGH (ref 17–63)
ANION GAP: 12 (ref 5–15)
AST: 87 U/L — ABNORMAL HIGH (ref 15–41)
BUN: 21 mg/dL — ABNORMAL HIGH (ref 6–20)
CALCIUM: 8.5 mg/dL — AB (ref 8.9–10.3)
CO2: 24 mmol/L (ref 22–32)
Chloride: 101 mmol/L (ref 101–111)
Creatinine, Ser: 0.44 mg/dL — ABNORMAL LOW (ref 0.61–1.24)
GFR calc Af Amer: >60 mL/min (ref >60)
GFR calc non Af Amer: >60 mL/min (ref >60)
GLUCOSE: 166 mg/dL — AB (ref 70–99)
Potassium: 3.6 mmol/L (ref 3.5–5.1)
Sodium: 137 mmol/L (ref 135–145)
Total Bilirubin: 11.1 mg/dL — ABNORMAL HIGH (ref 0.3–1.2)
Total Protein: 5.9 g/dL — ABNORMAL LOW (ref 6.5–8.1)

## 2015-03-11 LAB — GLUCOSE, CAPILLARY
GLUCOSE-CAPILLARY: 134 mg/dL — AB (ref 70–99)
GLUCOSE-CAPILLARY: 137 mg/dL — AB (ref 70–99)
Glucose-Capillary: 141 mg/dL — ABNORMAL HIGH (ref 70–99)
Glucose-Capillary: 155 mg/dL — ABNORMAL HIGH (ref 70–99)
Glucose-Capillary: 161 mg/dL — ABNORMAL HIGH (ref 70–99)
Glucose-Capillary: 169 mg/dL — ABNORMAL HIGH (ref 70–99)
Glucose-Capillary: 172 mg/dL — ABNORMAL HIGH (ref 70–99)

## 2015-03-11 LAB — BILIRUBIN, FRACTIONATED(TOT/DIR/INDIR)
BILIRUBIN TOTAL: 9.3 mg/dL — AB (ref 0.3–1.2)
Bilirubin, Direct: 6 mg/dL — ABNORMAL HIGH (ref 0.1–0.5)
Indirect Bilirubin: 3.3 mg/dL — ABNORMAL HIGH (ref 0.3–0.9)

## 2015-03-11 LAB — LIPASE, BLOOD: LIPASE: 15 U/L — AB (ref 22–51)

## 2015-03-11 MED ORDER — CEFTRIAXONE SODIUM IN DEXTROSE 40 MG/ML IV SOLN
2.0000 g | INTRAVENOUS | Status: DC
  Administered 2015-03-11 – 2015-03-15 (×5): 2 g via INTRAVENOUS
  Filled 2015-03-11 (×7): qty 50

## 2015-03-11 NOTE — Evaluation (Signed)
Physical Therapy Evaluation Patient Details Name: Justin Cohen MRN: [id number] DOB: 06-Sep-1946 Today's Date: 03/11/2015   History of Present Illness  69 year old male presented with right upper quadrant pain, fever, confusion and hypotension.  Abdominal ultrasound revealed dilated common bile duct, CT abdomen and pelvis showed cholelithiasis with dilated common bile duct and intrahepatic ductal dilatation. Seen by GI, underwent ERCP 5/1 with stone removal and sphincterotomy. Bilirubin continued to rise, ERCP repeated 5/3 with recommendations to consider cholecystectomy.  Clinical Impression  Pt admitted with above diagnosis. Pt currently with functional limitations due to the deficits listed below (see PT Problem List).  Pt will benefit from skilled PT to increase their independence and safety with mobility to allow discharge to the venue listed below.  Pt assisted with mobilizing safely and performed 10 reps of quad sets and heel slides with L LE.  Pt requiring max cues for processing and sequencing today as well however anticipate d/c home with spouse.     Follow Up Recommendations Home health PT;Supervision for mobility/OOB    Equipment Recommendations  None recommended by PT    Recommendations for Other Services       Precautions / Restrictions Precautions Precautions: Fall      Mobility  Bed Mobility Overal bed mobility: Needs Assistance Bed Mobility: Supine to Sit;Sit to Supine     Supine to sit: Min assist Sit to supine: Mod assist   General bed mobility comments: verbal and tactile cues for technique, pt with decreased processing so requiring max cues and time to self assist  Transfers Overall transfer level: Needs assistance Equipment used: Rolling walker (2 wheeled) Transfers: Sit to/from Stand Sit to Stand: Min assist         General transfer comment: verbal cues for safety, cues for hand placement, assist to steady  Ambulation/Gait Ambulation/Gait  assistance: Min guard Ambulation Distance (Feet): 160 Feet Assistive device: Rolling walker (2 wheeled) Gait Pattern/deviations: Step-through pattern;Decreased stride length;Trunk flexed     General Gait Details: verbal cues for RW positioning and posture  Stairs            Wheelchair Mobility    Modified Rankin (Stroke Patients Only)       Balance                                             Pertinent Vitals/Pain Pain Assessment: Faces Faces Pain Scale: Hurts even more Pain Location: back Pain Descriptors / Indicators: Sore;Aching Pain Intervention(s): Limited activity within patient's tolerance;Monitored during session;Repositioned    Home Living Family/patient expects to be discharged to:: Private residence Living Arrangements: Spouse/significant other   Type of Home: House Home Access: Stairs to enter   Technical brewer of Steps: 3 Home Layout: One level Home Equipment: Environmental consultant - 2 wheels      Prior Function Level of Independence: Independent with assistive device(s)               Hand Dominance        Extremity/Trunk Assessment               Lower Extremity Assessment: LLE deficits/detail   LLE Deficits / Details: L knee AAROM approx 110* with heel slide in supine     Communication   Communication: No difficulties  Cognition Arousal/Alertness: Awake/alert   Overall Cognitive Status: Impaired/Different from baseline Area of Impairment: Orientation;Following commands;Problem solving Orientation Level:  Disoriented to;Place;Time     Following Commands: Follows one step commands inconsistently     Problem Solving: Decreased initiation;Slow processing;Requires verbal cues;Requires tactile cues;Difficulty sequencing      General Comments      Exercises        Assessment/Plan    PT Assessment Patient needs continued PT services  PT Diagnosis Difficulty walking   PT Problem List Decreased  strength;Decreased activity tolerance;Decreased cognition;Decreased knowledge of use of DME;Decreased safety awareness;Decreased mobility;Decreased range of motion  PT Treatment Interventions DME instruction;Gait training;Functional mobility training;Patient/family education;Therapeutic activities;Therapeutic exercise;Stair training   PT Goals (Current goals can be found in the Care Plan section) Acute Rehab PT Goals PT Goal Formulation: With patient Time For Goal Achievement: 03/25/15 Potential to Achieve Goals: Good    Frequency Min 3X/week   Barriers to discharge        Co-evaluation               End of Session   Activity Tolerance: Patient tolerated treatment well Patient left: in bed;with call bell/phone within reach;with SCD's reapplied;with family/visitor present           Time: 2957-4734 PT Time Calculation (min) (ACUTE ONLY): 21 min   Charges:   PT Evaluation $Initial PT Evaluation Tier I: 1 Procedure     PT G Codes:        Seriyah Collison,KATHrine E 03/11/2015, 12:16 PM Carmelia Bake, PT, DPT 03/11/2015 Pager: [phone]

## 2015-03-11 NOTE — Progress Notes (Signed)
Joyce Copa 8:27 AM  Subjective: Patient possibly doing a little better and no obvious complication from his procedure but he still having some mild discomfort and does have a cough but no other complaints and is passing minimal gas and his case was discussed with the surgical team  Objective: Vital signs stable currently afebrile lungs are clear decreased heart sounds abdomen's a little tender in the right upper quadrant without guarding or rebound rare bowel sounds white count unchanged bili about the same other liver tests decreased  Assessment: Status post ERCP with increasing sphincterotomy balloon dilatation and stenting unfortunately without improvement in bilirubin questionably due to stent malfunction or bilirubin increased for other causes  Plan: Will get an x-ray to make sure there was not a quick migration otherwise agree with waiting one more day to repeat lab work and either proceed with surgery for possible repeat CT or MRCP to better delineate  [facility] E  Pager [phone] After 5PM or if no answer call [phone]

## 2015-03-11 NOTE — Progress Notes (Signed)
RT assessed PT = BBS clear diminished (faint, end expiratory wheeze on RUL). PT states he is breathing fine at this time. Sp02 97% at this time. RN aware.

## 2015-03-11 NOTE — Progress Notes (Signed)
OT Cancellation Note  Patient Details Name: Justin Cohen MRN: [id number] DOB: 28-Nov-1945   Cancelled Treatment:    Reason Eval/Treat Not Completed: Other (comment).  Pt just got back to bed and is fatiqued.  Will check back tomorrow.  Wife states that pt was doing very well at home and needed min A for ADLs.  They have all DME.  Atina Feeley 03/11/2015, 3:40 PM  Lesle Chris, OTR/L [phone] 03/11/2015

## 2015-03-11 NOTE — Progress Notes (Signed)
Report called to [facility], Therapist, sports.  Patient transported to room 1422 via bed.  Spouse at the bedside.

## 2015-03-11 NOTE — Progress Notes (Addendum)
PROGRESS NOTE  RUSHTON EARLY XIP:[id number] DOB: Mar 14, 1946 DOA: 03/06/2015 PCP: No primary care provider on file.  Summary: 69 year old man presented with right upper quadrant pain, fever, confusion and hypotension. Abdominal ultrasound revealed dilated common bile duct, CT abdomen and pelvis showed cholelithiasis with dilated common bile duct and intrahepatic ductal dilatation. Seen by GI, underwent ERCP 5/1 with stone removal and sphincterotomy. Bilirubin continued to rise, ERCP repeated 5/3 with recommendations to consider cholecystectomy.  Assessment/Plan: 1. Sepsis secondary to Escherichia coli bacteremia secondary to cholangitis secondary to choledocholithiasis. Hemodynamics stable. 2. Cholangitis secondary to E coli. Change to ceftriaxone based on sensitivities. 3. Choledocholithiasis. Further management per GI. 4. Elevated LFTs presumed secondary to cholangitis. Improvement with the exception of total bilirubin. 5. Acute encephalopathy, very minimal. Secondary to acute infection. 6. Diabetes mellitus type 2. Stable. Continue low-dose Lantus and sliding scale insulin. Hemoglobin A1c 7.3. 7. History left total knee replacement. Patient reports no unusual knee pain. Physical therapy was started again 5/3. The incision appears clean and dry. 8. Obstructive sleep apnea 9. Pulmonary nodules, incidental finding seen on CT. Outpatient follow-up recommended with chest CT 6-12 months.   Hemodynamics stable. Plan transfer to medical floor.  Plan to change antibiotics to ceftriaxone based on sensitivities.  Repeat lab work in the morning  GI recommends observing additional 24 hours with repeat blood work in the morning, general surgery following for consideration of cholecystectomy this hospitalization.  Continue physical therapy for knee replacement.  Discussed in detail with patient's wife at bedside.  Code Status: full code DVT prophylaxis: heparin Family Communication:    Disposition Plan: home when improved  [name], MD  [facility]  Pager [phone] If 7PM-7AM, please contact night-coverage at [email or web address], password [facility] 03/11/2015, 8:44 AM  LOS: 5 days   Consultants:  GI  Procedures:  5/1 ERCP  5/3 repeat ERCP ENDOSCOPIC IMPRESSION:1. Normal ampullawith previous sphincterotomy site status post increased sphincterotomy as above 2. No pancreatic duct injections or wires advancement throughout the procedure 3. Few small stones removed4. Multiple negative balloon pull through's and negative occlusion cholangiogram 5. No obvious straining status post balloon dilation as above without better drainage status post stent as above  Antibiotics:  Zosyn 4/29 >> 5/4  Ceftriaxone 5/4 >>  HPI/Subjective: GI recommends observation and additional day with repeat blood work in the morning to decide whether patient proceeds to surgery or has further imaging.  Patient feels okay. Mild abdominal pain. No nausea. Wife at bedside reports very mild confusion.  Objective: Filed Vitals:   03/11/15 0300 03/11/15 0400 03/11/15 0552 03/11/15 0800  BP:  174/72 162/60   Pulse: 113 114 111   Temp:  99.3 F (37.4 C)  98.3 F (36.8 C)  TempSrc:  Oral  Oral  Resp: 31 21 23    Height:      Weight:      SpO2: 92% 96% 97%     Intake/Output Summary (Last 24 hours) at 03/11/15 0844 Last data filed at 03/11/15 0737  Gross per 24 hour  Intake 2287.5 ml  Output   2050 ml  Net  237.5 ml     Filed Weights   03/07/15 0446 03/09/15 0600 03/10/15 0400  Weight: 126.5 kg (278 lb 14.1 oz) 126.4 kg (278 lb 10.6 oz) 130.8 kg (288 lb 5.8 oz)    Exam:     Tm 101.2, hypertensive, no hypoxia. Mild tachycardia. General:  Appears calm and comfortable Cardiovascular: RRR, no m/r/g. No LE edema. Respiratory: CTA bilaterally, no w/r/r; some  upper airway noise. Normal respiratory effort. Speaks in full sentences. Abdomen: soft, ntnd Psychiatric: grossly  normal mood and affect, speech fluent and appropriate. Mildly confused. Neurologic: grossly non-focal.  New data reviewed:  Alkaline phosphatase slightly lower, lipase without change. AST and ALT slightly improved. Total bilirubin has increased to 11.1  WBC without significant change, 14.2. Hemoglobin stable 11.1.  Pertinent data since admission:  Blood culture Escherichia coli sensitive to ceftriaxone.  Pending data:    Scheduled Meds: . antiseptic oral rinse  7 mL Mouth Rinse BID  . dorzolamide-timolol  1 drop Both Eyes BID  . feeding supplement (PRO-STAT SUGAR FREE 64)  30 mL Oral BID  . feeding supplement (RESOURCE BREEZE)  1 Container Oral BID BM  . fluticasone  1 spray Each Nare Daily  . heparin subcutaneous  5,000 Units Subcutaneous 3 times per day  . indomethacin  100 mg Rectal Once  . insulin aspart  0-9 Units Subcutaneous 6 times per day  . insulin glargine  4 Units Subcutaneous Daily  . piperacillin-tazobactam  3.375 g Intravenous 3 times per day  . venlafaxine XR  75 mg Oral Daily   Continuous Infusions: . sodium chloride    . sodium chloride 0.9 % 1,000 mL with potassium chloride 20 mEq infusion 100 mL/hr at 03/10/15 0600    Principal Problem:   Sepsis due to Escherichia coli Active Problems:   Cholangitis   HTN (hypertension)   DM w/o complication type II   Encephalopathy due to infection   Sleep apnea   Choledocholithiasis   Time spent 35 minutes

## 2015-03-11 NOTE — Progress Notes (Signed)
1 Day Post-Op  Subjective: Still a little tender RUQ, hurts some when he coughs.  Otherwise seems comfortable.  Objective: Vital signs in last 24 hours: Temp:  [99.3 F (37.4 C)-101.2 F (38.4 C)] 99.3 F (37.4 C) (05/04 0400) Pulse Rate:  [25-114] 111 (05/04 0552) Resp:  [9-37] 23 (05/04 0552) BP: (116-193)/(46-88) 162/60 mmHg (05/04 0552) SpO2:  [92 %-100 %] 97 % (05/04 0552) Last BM Date: 03/10/15 600 PO  Currently NPO TM 101.2 yesterday afternoon, 100.8 last PM 2300. Tachycardic, Tbil up to 11.1, lipase 15, AST/ALT down to 87/109 WBC stable at 14.2 Intake/Output from previous day: 05/03 0701 - 05/04 0700 In: 2375 [P.O.:600; I.V.:1700; IV Piggyback:75] Out: 1900 [Urine:1900] Intake/Output this shift:    General appearance: alert, cooperative and no distress GI: soft some soreness mid abdomen, and a little tender RUQ with palpation.  + BS.  Lab Results:   Recent Labs  03/10/15 0346 03/11/15 0335  WBC 14.1* 14.2*  HGB 10.5* 11.1*  HCT 30.6* 32.3*  PLT 367 362    BMET  Recent Labs  03/10/15 0346 03/11/15 0335  NA 136 137  K 3.7 3.6  CL 104 101  CO2 25 24  GLUCOSE 165* 166*  BUN 21* 21*  CREATININE 0.82 0.44*  CALCIUM 8.5* 8.5*   PT/INR No results for input(s): LABPROT, INR in the last 72 hours.   Recent Labs Lab 03/06/15 1818 03/07/15 0514 03/09/15 0347 03/10/15 0346 03/11/15 0335  AST 271* 221* 179* 110* 87*  ALT 223* 205* 166* 129* 109*  ALKPHOS 309* 244* 452* 390* 377*  BILITOT 7.5* 7.7* 7.9* 10.5* 11.1*  PROT 7.6 6.4 6.6 6.0* 5.9*  ALBUMIN 3.7 3.0* 2.8* 2.5* 2.5*     Lipase     Component Value Date/Time   LIPASE 15* 03/11/2015 0335     Studies/Results: X-ray Chest Pa Or Ap  03/10/2015   CLINICAL DATA:  Difficulty breathing after ERCP. History of melanoma and prostate carcinoma  EXAM: CHEST  1 VIEW  COMPARISON:  March 06, 2015  FINDINGS: There is atelectatic change in both lower lobes. There is no edema or consolidation. Heart is  mildly enlarged with pulmonary vascularity within normal limits. No adenopathy. There is degenerative change in the thoracic spine. There are no blastic or lytic bone lesions apparent.  IMPRESSION: Atelectatic change in both lower lobes. Elsewhere lungs clear. Heart prominent but stable.   Electronically Signed   By: Lowella Grip III M.D.   On: 03/10/2015 17:55   Dg Ercp Biliary & Pancreatic Ducts  03/10/2015   CLINICAL DATA:  Jaundice.  EXAM: ERCP  TECHNIQUE: Multiple spot images obtained with the fluoroscopic device and submitted for interpretation post-procedure.  FLUOROSCOPY TIME:  Fluoroscopy Time:  6 minutes and 18 seconds  Number of Acquired Images:  6  COMPARISON:  03/08/2015  FINDINGS: Filling defects in the common bile duct are compatible with stones. Opacification of the extrahepatic biliary system and the central intrahepatic bile ducts. Evidence of a balloon pull-through for stone extraction. In addition, evidence for balloon dilatation of the distal common bile duct. Placement of a non-metallic biliary stent.  IMPRESSION: Stone extraction and stent placement.  These images were submitted for radiologic interpretation only. Please see the procedural report for the amount of contrast and the fluoroscopy time utilized.   Electronically Signed   By: Markus Daft M.D.   On: 03/10/2015 18:15    Medications: . antiseptic oral rinse  7 mL Mouth Rinse BID  . dorzolamide-timolol  1 drop Both Eyes BID  . feeding supplement (PRO-STAT SUGAR FREE 64)  30 mL Oral BID  . feeding supplement (RESOURCE BREEZE)  1 Container Oral BID BM  . fluticasone  1 spray Each Nare Daily  . heparin subcutaneous  5,000 Units Subcutaneous 3 times per day  . indomethacin  100 mg Rectal Once  . insulin aspart  0-9 Units Subcutaneous 6 times per day  . insulin glargine  4 Units Subcutaneous Daily  . piperacillin-tazobactam  3.375 g Intravenous 3 times per day  . venlafaxine XR  75 mg Oral Daily    Assessment/Plan 1.   Sepsis with cholangitis, cholelithiasis, choledocholithiais 1A.  S/p ERCP 03/08/15 Dr. Deatra Ina 1B.  ERCP with sphincterotomy/papillotomy, ERCP with removal of calculus/calculi , ERCP with stent placement, and ERCP with balloon dilation,03/10/15, Dr. Clarene Essex 2. S/p left Knee replacement, 02/17/15 3.  Hx of prostate cancer 4.  AODM 5.  Sleep Apnea on CPAP 6.  Body mass index is 42.38  7.  LFT's still up, AST, ALT is better, alk Phos and T bil to 11.1 Day 6 Zosyn,  Vancomycin x 1 dose 4/29. DVT: Heparin/SCD  Plan: from our standpoint keep him NPO, just some ice chips and sips for today, I will recheck Bil later today and in the AM.  If his bilirubin goes down will will plan to do surgery tomorrow, if it goes up we will review with GI.  Continue antibiotics.         LOS: 5 days    Rook Maue 03/11/2015

## 2015-03-12 ENCOUNTER — Encounter (HOSPITAL_COMMUNITY): Payer: Self-pay | Admitting: Anesthesiology

## 2015-03-12 ENCOUNTER — Inpatient Hospital Stay (HOSPITAL_COMMUNITY): Payer: Commercial Managed Care - HMO | Admitting: Anesthesiology

## 2015-03-12 ENCOUNTER — Encounter (HOSPITAL_COMMUNITY)
Admission: EM | Disposition: A | Discharge status: Home Health | Payer: Self-pay | Source: Home / Self Care | Attending: Family Medicine

## 2015-03-12 HISTORY — PX: CHOLECYSTECTOMY: SHX55

## 2015-03-12 LAB — COMPREHENSIVE METABOLIC PANEL
ALK PHOS: 305 U/L — AB (ref 38–126)
ALT: 80 U/L — ABNORMAL HIGH (ref 17–63)
ANION GAP: 8 (ref 5–15)
AST: 66 U/L — ABNORMAL HIGH (ref 15–41)
Albumin: 2 g/dL — ABNORMAL LOW (ref 3.5–5.0)
BUN: 21 mg/dL — ABNORMAL HIGH (ref 6–20)
CO2: 25 mmol/L (ref 22–32)
Calcium: 8 mg/dL — ABNORMAL LOW (ref 8.9–10.3)
Chloride: 103 mmol/L (ref 101–111)
Creatinine, Ser: 0.73 mg/dL (ref 0.61–1.24)
GFR calc Af Amer: >60 mL/min (ref >60)
GFR calc non Af Amer: >60 mL/min (ref >60)
GLUCOSE: 156 mg/dL — AB (ref 70–99)
Potassium: 3.1 mmol/L — ABNORMAL LOW (ref 3.5–5.1)
Sodium: 136 mmol/L (ref 135–145)
Total Bilirubin: 6 mg/dL — ABNORMAL HIGH (ref 0.3–1.2)
Total Protein: 5.4 g/dL — ABNORMAL LOW (ref 6.5–8.1)

## 2015-03-12 LAB — GLUCOSE, CAPILLARY
GLUCOSE-CAPILLARY: 172 mg/dL — AB (ref 70–99)
GLUCOSE-CAPILLARY: 184 mg/dL — AB (ref 70–99)
GLUCOSE-CAPILLARY: 207 mg/dL — AB (ref 70–99)
GLUCOSE-CAPILLARY: 222 mg/dL — AB (ref 70–99)
Glucose-Capillary: 153 mg/dL — ABNORMAL HIGH (ref 70–99)
Glucose-Capillary: 140 mg/dL — ABNORMAL HIGH (ref 70–99)

## 2015-03-12 LAB — LIPASE, BLOOD: Lipase: 17 U/L — ABNORMAL LOW (ref 22–51)

## 2015-03-12 LAB — CBC
HEMATOCRIT: 29.1 % — AB (ref 39.0–52.0)
Hemoglobin: 10.2 g/dL — ABNORMAL LOW (ref 13.0–17.0)
MCH: 29.1 pg (ref 26.0–34.0)
MCHC: 35.1 g/dL (ref 30.0–36.0)
MCV: 82.9 fL (ref 78.0–100.0)
Platelets: 351 10*3/uL (ref 150–400)
RBC: 3.51 MIL/uL — AB (ref 4.22–5.81)
RDW: 14.4 % (ref 11.5–15.5)
WBC: 13.2 10*3/uL — ABNORMAL HIGH (ref 4.0–10.5)

## 2015-03-12 SURGERY — LAPAROSCOPIC CHOLECYSTECTOMY WITH INTRAOPERATIVE CHOLANGIOGRAM
Anesthesia: General | Site: Abdomen

## 2015-03-12 MED ORDER — NEOSTIGMINE METHYLSULFATE 10 MG/10ML IV SOLN
INTRAVENOUS | Status: DC | PRN
  Administered 2015-03-12: 5 mg via INTRAVENOUS

## 2015-03-12 MED ORDER — ONDANSETRON HCL 4 MG PO TABS: 4.0000 mg | ORAL_TABLET | Freq: Four times a day (QID) | ORAL | Status: DC | PRN

## 2015-03-12 MED ORDER — LABETALOL HCL 5 MG/ML IV SOLN
INTRAVENOUS | Status: AC
  Filled 2015-03-12: qty 4

## 2015-03-12 MED ORDER — PROPOFOL 10 MG/ML IV BOLUS
INTRAVENOUS | Status: DC | PRN
  Administered 2015-03-12: 200 mg via INTRAVENOUS

## 2015-03-12 MED ORDER — METRONIDAZOLE IN NACL 5-0.79 MG/ML-% IV SOLN
500.0000 mg | Freq: Once | INTRAVENOUS | Status: AC
  Administered 2015-03-12: 500 mg via INTRAVENOUS

## 2015-03-12 MED ORDER — ONDANSETRON HCL 4 MG/2ML IJ SOLN
INTRAMUSCULAR | Status: AC
  Filled 2015-03-12: qty 2

## 2015-03-12 MED ORDER — DEXAMETHASONE SODIUM PHOSPHATE 10 MG/ML IJ SOLN
INTRAMUSCULAR | Status: DC | PRN
  Administered 2015-03-12: 10 mg via INTRAVENOUS

## 2015-03-12 MED ORDER — CISATRACURIUM BESYLATE (PF) 10 MG/5ML IV SOLN
INTRAVENOUS | Status: DC | PRN
  Administered 2015-03-12: 6 mg via INTRAVENOUS
  Administered 2015-03-12: 4 mg via INTRAVENOUS
  Administered 2015-03-12: 1 mg via INTRAVENOUS

## 2015-03-12 MED ORDER — CISATRACURIUM BESYLATE 20 MG/10ML IV SOLN
INTRAVENOUS | Status: AC
  Filled 2015-03-12: qty 10

## 2015-03-12 MED ORDER — LABETALOL HCL 5 MG/ML IV SOLN
5.0000 mg | INTRAVENOUS | Status: DC | PRN
Start: 2015-03-12 — End: 2015-03-12
  Administered 2015-03-12 (×2): 5 mg via INTRAVENOUS

## 2015-03-12 MED ORDER — HYDROMORPHONE HCL 1 MG/ML IJ SOLN
INTRAMUSCULAR | Status: AC
  Filled 2015-03-12: qty 1

## 2015-03-12 MED ORDER — METRONIDAZOLE IN NACL 5-0.79 MG/ML-% IV SOLN
INTRAVENOUS | Status: AC
  Filled 2015-03-12: qty 100

## 2015-03-12 MED ORDER — LACTATED RINGERS IV SOLN: INTRAVENOUS | Status: DC

## 2015-03-12 MED ORDER — BUPIVACAINE-EPINEPHRINE (PF) 0.25% -1:200000 IJ SOLN
INTRAMUSCULAR | Status: DC | PRN
  Administered 2015-03-12: 8 mL

## 2015-03-12 MED ORDER — FENTANYL CITRATE (PF) 100 MCG/2ML IJ SOLN
INTRAMUSCULAR | Status: AC
  Filled 2015-03-12: qty 2

## 2015-03-12 MED ORDER — ONDANSETRON HCL 4 MG/2ML IJ SOLN: 4.0000 mg | Freq: Four times a day (QID) | INTRAMUSCULAR | Status: DC | PRN

## 2015-03-12 MED ORDER — DEXAMETHASONE SODIUM PHOSPHATE 10 MG/ML IJ SOLN
INTRAMUSCULAR | Status: AC
  Filled 2015-03-12: qty 1

## 2015-03-12 MED ORDER — ONDANSETRON HCL 4 MG/2ML IJ SOLN
INTRAMUSCULAR | Status: DC | PRN
  Administered 2015-03-12: 4 mg via INTRAVENOUS

## 2015-03-12 MED ORDER — HYDROMORPHONE HCL 1 MG/ML IJ SOLN
0.2500 mg | INTRAMUSCULAR | Status: DC | PRN
  Administered 2015-03-12 (×2): 0.5 mg via INTRAVENOUS

## 2015-03-12 MED ORDER — LACTATED RINGERS IV SOLN
INTRAVENOUS | Status: DC | PRN
  Administered 2015-03-12 (×2): via INTRAVENOUS

## 2015-03-12 MED ORDER — GLYCOPYRROLATE 0.2 MG/ML IJ SOLN
INTRAMUSCULAR | Status: DC | PRN
  Administered 2015-03-12: .8 mg via INTRAVENOUS

## 2015-03-12 MED ORDER — POTASSIUM CHLORIDE IN NACL 20-0.9 MEQ/L-% IV SOLN
INTRAVENOUS | Status: AC
  Administered 2015-03-12: 14:00:00
  Filled 2015-03-12: qty 1000

## 2015-03-12 MED ORDER — PHENYLEPHRINE HCL 10 MG/ML IJ SOLN
INTRAMUSCULAR | Status: DC | PRN
  Administered 2015-03-12 (×2): 120 ug via INTRAVENOUS

## 2015-03-12 MED ORDER — LACTATED RINGERS IR SOLN
Status: DC | PRN
  Administered 2015-03-12: 1500 mL

## 2015-03-12 MED ORDER — METOCLOPRAMIDE HCL 5 MG/ML IJ SOLN
INTRAMUSCULAR | Status: DC | PRN
  Administered 2015-03-12: 10 mg via INTRAVENOUS

## 2015-03-12 MED ORDER — PROPOFOL 10 MG/ML IV BOLUS
INTRAVENOUS | Status: AC
  Filled 2015-03-12: qty 20

## 2015-03-12 MED ORDER — METOCLOPRAMIDE HCL 5 MG/ML IJ SOLN
INTRAMUSCULAR | Status: AC
  Filled 2015-03-12: qty 2

## 2015-03-12 MED ORDER — GLYCOPYRROLATE 0.2 MG/ML IJ SOLN
INTRAMUSCULAR | Status: AC
  Filled 2015-03-12: qty 4

## 2015-03-12 MED ORDER — LACTATED RINGERS IV SOLN
INTRAVENOUS | Status: DC | PRN
  Administered 2015-03-12: 1000 mL

## 2015-03-12 MED ORDER — PHENYLEPHRINE 40 MCG/ML (10ML) SYRINGE FOR IV PUSH (FOR BLOOD PRESSURE SUPPORT)
PREFILLED_SYRINGE | INTRAVENOUS | Status: AC
  Filled 2015-03-12: qty 10

## 2015-03-12 MED ORDER — BUPIVACAINE-EPINEPHRINE (PF) 0.25% -1:200000 IJ SOLN
INTRAMUSCULAR | Status: AC
  Filled 2015-03-12: qty 30

## 2015-03-12 MED ORDER — SUCCINYLCHOLINE CHLORIDE 20 MG/ML IJ SOLN
INTRAMUSCULAR | Status: DC | PRN
  Administered 2015-03-12: 100 mg via INTRAVENOUS

## 2015-03-12 MED ORDER — FENTANYL CITRATE (PF) 100 MCG/2ML IJ SOLN
INTRAMUSCULAR | Status: DC | PRN
  Administered 2015-03-12: 100 ug via INTRAVENOUS
  Administered 2015-03-12 (×4): 50 ug via INTRAVENOUS

## 2015-03-12 MED ORDER — 0.9 % SODIUM CHLORIDE (POUR BTL) OPTIME
TOPICAL | Status: DC | PRN
  Administered 2015-03-12: 1000 mL

## 2015-03-12 MED ORDER — HYDROMORPHONE HCL 1 MG/ML IJ SOLN
1.0000 mg | INTRAMUSCULAR | Status: DC | PRN
Start: 2015-03-12 — End: 2015-03-13
  Administered 2015-03-12: 1 mg via INTRAVENOUS
  Filled 2015-03-12: qty 1

## 2015-03-12 SURGICAL SUPPLY — 53 items
APPLIER CLIP 5 13 M/L LIGAMAX5 (MISCELLANEOUS) ×3
BENZOIN TINCTURE PRP APPL 2/3 (GAUZE/BANDAGES/DRESSINGS) ×3 IMPLANT
BLADE 10 SAFETY STRL DISP (BLADE) ×3 IMPLANT
CABLE HIGH FREQUENCY MONO STRZ (ELECTRODE) ×3 IMPLANT
CHLORAPREP W/TINT 26ML (MISCELLANEOUS) ×3 IMPLANT
CLIP APPLIE 5 13 M/L LIGAMAX5 (MISCELLANEOUS) ×1 IMPLANT
CLIP LIGATING HEMO O LOK GREEN (MISCELLANEOUS) ×3 IMPLANT
CLOSURE WOUND 1/2 X4 (GAUZE/BANDAGES/DRESSINGS) ×1
COVER MAYO STAND STRL (DRAPES) ×3 IMPLANT
COVER TRANSDUCER ULTRASND (DRAPES) ×3 IMPLANT
DECANTER SPIKE VIAL GLASS SM (MISCELLANEOUS) ×3 IMPLANT
DEVICE TROCAR PUNCTURE CLOSURE (ENDOMECHANICALS) ×3 IMPLANT
DRAIN CHANNEL 19F RND (DRAIN) ×3 IMPLANT
DRAPE C-ARM 42X120 X-RAY (DRAPES) ×3 IMPLANT
DRAPE LAPAROSCOPIC ABDOMINAL (DRAPES) ×3 IMPLANT
ELECT REM PT RETURN 9FT ADLT (ELECTROSURGICAL) ×3
ELECTRODE REM PT RTRN 9FT ADLT (ELECTROSURGICAL) ×1 IMPLANT
EVACUATOR DRAINAGE 10X20 100CC (DRAIN) ×1 IMPLANT
EVACUATOR SILICONE 100CC (DRAIN) ×2
GAUZE SPONGE 2X2 8PLY STRL LF (GAUZE/BANDAGES/DRESSINGS) ×1 IMPLANT
GAUZE SPONGE 4X4 12PLY STRL (GAUZE/BANDAGES/DRESSINGS) ×3 IMPLANT
GLOVE BIO SURGEON STRL SZ 6.5 (GLOVE) ×2 IMPLANT
GLOVE BIO SURGEON STRL SZ7.5 (GLOVE) ×3 IMPLANT
GLOVE BIO SURGEONS STRL SZ 6.5 (GLOVE) ×1
GLOVE BIOGEL PI IND STRL 7.0 (GLOVE) ×1 IMPLANT
GLOVE BIOGEL PI IND STRL 8.5 (GLOVE) ×1 IMPLANT
GLOVE BIOGEL PI INDICATOR 7.0 (GLOVE) ×2
GLOVE BIOGEL PI INDICATOR 8.5 (GLOVE) ×2
GLOVE SURG SS PI 8.5 STRL IVOR (GLOVE) ×2
GLOVE SURG SS PI 8.5 STRL STRW (GLOVE) ×1 IMPLANT
GOWN STRL REUS W/TWL XL LVL3 (GOWN DISPOSABLE) ×9 IMPLANT
HEMOSTAT SNOW SURGICEL 2X4 (HEMOSTASIS) ×3 IMPLANT
KIT BASIN OR (CUSTOM PROCEDURE TRAY) ×3 IMPLANT
NEEDLE INSUFFLATION 14GA 120MM (NEEDLE) ×3 IMPLANT
NS IRRIG 1000ML POUR BTL (IV SOLUTION) ×3 IMPLANT
PACK GENERAL/GYN (CUSTOM PROCEDURE TRAY) ×3 IMPLANT
POUCH RETRIEVAL ECOSAC 10 (ENDOMECHANICALS) ×1 IMPLANT
POUCH RETRIEVAL ECOSAC 10MM (ENDOMECHANICALS) ×2
RINGERS IRRIG 1000ML POUR BTL (IV SOLUTION) ×3 IMPLANT
SCISSORS LAP 5X35 DISP (ENDOMECHANICALS) ×3 IMPLANT
SET CHOLANGIOGRAPH MIX (MISCELLANEOUS) ×3 IMPLANT
SET IRRIG TUBING LAPAROSCOPIC (IRRIGATION / IRRIGATOR) ×3 IMPLANT
SLEEVE XCEL OPT CAN 5 100 (ENDOMECHANICALS) ×6 IMPLANT
SPONGE DRAIN TRACH 4X4 STRL 2S (GAUZE/BANDAGES/DRESSINGS) ×3 IMPLANT
SPONGE GAUZE 2X2 STER 10/PKG (GAUZE/BANDAGES/DRESSINGS) ×2
STRIP CLOSURE SKIN 1/2X4 (GAUZE/BANDAGES/DRESSINGS) ×2 IMPLANT
SUT ETHILON 2 0 PS N (SUTURE) ×3 IMPLANT
SUT MNCRL AB 4-0 PS2 18 (SUTURE) ×3 IMPLANT
TAPE CLOTH SURG 4X10 WHT LF (GAUZE/BANDAGES/DRESSINGS) ×3 IMPLANT
TOWEL OR 17X26 10 PK STRL BLUE (TOWEL DISPOSABLE) ×3 IMPLANT
TRAY LAPAROSCOPIC (CUSTOM PROCEDURE TRAY) ×3 IMPLANT
TROCAR BLADELESS OPT 5 100 (ENDOMECHANICALS) ×3 IMPLANT
TROCAR XCEL NON-BLD 11X100MML (ENDOMECHANICALS) ×3 IMPLANT

## 2015-03-12 NOTE — Anesthesia Preprocedure Evaluation (Addendum)
Anesthesia Evaluation  Patient identified by MRN, date of birth, ID band Patient awake    Reviewed: Allergy & Precautions, NPO status , Patient's Chart, lab work & pertinent test results  Airway Mallampati: II  TM Distance: >3 FB Neck ROM: Full    Dental no notable dental hx.    Pulmonary sleep apnea , former smoker,  breath sounds clear to auscultation  Pulmonary exam normal       Cardiovascular hypertension, Pt. on medications Normal cardiovascular examRhythm:Regular Rate:Normal     Neuro/Psych Recent confusion. Encephalopathy due to infection is the diagnosis. Consent signed by spouse. negative psych ROS   GI/Hepatic negative GI ROS, Neg liver ROS,   Endo/Other  diabetes, Type 2, Oral Hypoglycemic AgentsMorbid obesity  Renal/GU negative Renal ROS  negative genitourinary   Musculoskeletal negative musculoskeletal ROS (+)   Abdominal (+) + obese,   Peds negative pediatric ROS (+)  Hematology negative hematology ROS (+)   Anesthesia Other Findings   Reproductive/Obstetrics negative OB ROS                            Anesthesia Physical Anesthesia Plan  ASA: III  Anesthesia Plan: General   Post-op Pain Management:    Induction: Intravenous  Airway Management Planned: Oral ETT  Additional Equipment:   Intra-op Plan:   Post-operative Plan: Extubation in OR  Informed Consent: I have reviewed the patients History and Physical, chart, labs and discussed the procedure including the risks, benefits and alternatives for the proposed anesthesia with the patient or authorized representative who has indicated his/her understanding and acceptance.   Dental advisory given  Plan Discussed with: CRNA  Anesthesia Plan Comments:         Anesthesia Quick Evaluation

## 2015-03-12 NOTE — Progress Notes (Signed)
Pt stated that there was too much air coming from his CPAP and that he wanted to take it off.  RT removed CPAP and placed pt on 4,LPM Glide.  Pt tolerating well at this time, RT to monitor and assess as needed.

## 2015-03-12 NOTE — Progress Notes (Signed)
Day of Surgery  Subjective: Pt doing well today  Objective: Vital signs in last 24 hours: Temp:  [98.4 F (36.9 C)-99.5 F (37.5 C)] 99.5 F (37.5 C) (05/05 0534) Pulse Rate:  [84-98] 84 (05/05 0534) Resp:  [20-24] 22 (05/05 0534) BP: (155-179)/(61-80) 177/78 mmHg (05/05 0534) SpO2:  [88 %-98 %] 97 % (05/05 0534) Last BM Date: 03/12/15  Intake/Output from previous day: 05/04 0701 - 05/05 0700 In: 1312.5 [I.V.:1300; IV Piggyback:12.5] Out: 1250 [Urine:1250] Intake/Output this shift: Total I/O In: -  Out: 750 [Urine:750]  General appearance: jaundiced GI: soft, non-tender; bowel sounds normal; no masses,  no organomegaly  Lab Results:   Recent Labs  03/11/15 0335 03/12/15 0510  WBC 14.2* 13.2*  HGB 11.1* 10.2*  HCT 32.3* 29.1*  PLT 362 351   BMET  Recent Labs  03/11/15 0335 03/12/15 0510  NA 137 136  K 3.6 3.1*  CL 101 103  CO2 24 25  GLUCOSE 166* 156*  BUN 21* 21*  CREATININE 0.44* 0.73  CALCIUM 8.5* 8.0*   Hepatic Function Latest Ref Rng 03/12/2015 03/11/2015 03/11/2015  Total Protein 6.5 - 8.1 g/dL 5.4(L) - 5.9(L)  Albumin 3.5 - 5.0 g/dL 2.0(L) - 2.5(L)  AST 15 - 41 U/L 66(H) - 87(H)  ALT 17 - 63 U/L 80(H) - 109(H)  Alk Phosphatase 38 - 126 U/L 305(H) - 377(H)  Total Bilirubin 0.3 - 1.2 mg/dL 6.0(H) 9.3(H) 11.1(H)  Bilirubin, Direct 0.1 - 0.5 mg/dL - 6.0(H) -     Studies/Results: X-ray Chest Pa Or Ap  03/10/2015   CLINICAL DATA:  Difficulty breathing after ERCP. History of melanoma and prostate carcinoma  EXAM: CHEST  1 VIEW  COMPARISON:  March 06, 2015  FINDINGS: There is atelectatic change in both lower lobes. There is no edema or consolidation. Heart is mildly enlarged with pulmonary vascularity within normal limits. No adenopathy. There is degenerative change in the thoracic spine. There are no blastic or lytic bone lesions apparent.  IMPRESSION: Atelectatic change in both lower lobes. Elsewhere lungs clear. Heart prominent but stable.   Electronically  Signed   By: Lowella Grip III M.D.   On: 03/10/2015 17:55   Dg Abd 1 View  03/11/2015   CLINICAL DATA:  Abdominal pain.  EXAM: ABDOMEN - 1 VIEW  COMPARISON:  CT 03/06/2015.  FINDINGS: Biliary catheter not right. Surgical clips of the right. Minimally distended air-filled loops of small bowel. Colonic gas pattern unremarkable. Oral contrast in the colon . Mild adynamic ileus cannot be completely excluded. Degenerative changes lumbar spine .  IMPRESSION: 1. Biliary drainage catheter noted over the right upper quadrant. 2. Air-filled loops of small and large bowel noted. Minimal distention. Oral contrast in the colon. Mild adynamic ileus cannot be excluded P   Electronically Signed   By: [place]   On: 03/11/2015 09:46   Dg Ercp Biliary & Pancreatic Ducts  03/10/2015   CLINICAL DATA:  Jaundice.  EXAM: ERCP  TECHNIQUE: Multiple spot images obtained with the fluoroscopic device and submitted for interpretation post-procedure.  FLUOROSCOPY TIME:  Fluoroscopy Time:  6 minutes and 18 seconds  Number of Acquired Images:  6  COMPARISON:  03/08/2015  FINDINGS: Filling defects in the common bile duct are compatible with stones. Opacification of the extrahepatic biliary system and the central intrahepatic bile ducts. Evidence of a balloon pull-through for stone extraction. In addition, evidence for balloon dilatation of the distal common bile duct. Placement of a non-metallic biliary stent.  IMPRESSION: Joaquim Lai  extraction and stent placement.  These images were submitted for radiologic interpretation only. Please see the procedural report for the amount of contrast and the fluoroscopy time utilized.   Electronically Signed   By: Markus Daft M.D.   On: 03/10/2015 18:15    Anti-infectives: Anti-infectives    Start     Dose/Rate Route Frequency Ordered Stop   03/11/15 1600  [MAR Hold]  cefTRIAXone (ROCEPHIN) 2 g in dextrose 5 % 50 mL IVPB - Premix     (MAR Hold since 03/12/15 0818)   2 g 100 mL/hr over 30  Minutes Intravenous Every 24 hours 03/11/15 0941     03/07/15 1800  vancomycin (VANCOCIN) IVPB 1000 mg/200 mL premix  Status:  Discontinued     1,000 mg 200 mL/hr over 60 Minutes Intravenous Every 12 hours 03/07/15 0551 03/07/15 1524   03/07/15 0600  piperacillin-tazobactam (ZOSYN) IVPB 3.375 g  Status:  Discontinued     3.375 g 12.5 mL/hr over 240 Minutes Intravenous 3 times per day 03/07/15 0449 03/11/15 0941   03/07/15 0530  vancomycin (VANCOCIN) 2,500 mg in sodium chloride 0.9 % 500 mL IVPB     2,500 mg 250 mL/hr over 120 Minutes Intravenous  Once 03/07/15 0456 03/07/15 0749   03/06/15 2315  piperacillin-tazobactam (ZOSYN) IVPB 3.375 g  Status:  Discontinued     3.375 g 100 mL/hr over 30 Minutes Intravenous 3 times per day 03/06/15 2309 03/07/15 0449   03/06/15 2100  imipenem-cilastatin (PRIMAXIN) 500 mg in sodium chloride 0.9 % 100 mL IVPB     500 mg 200 mL/hr over 30 Minutes Intravenous  Once 03/06/15 2008 03/06/15 2130   03/06/15 1900  cefTRIAXone (ROCEPHIN) 1 g in dextrose 5 % 50 mL IVPB     1 g 100 mL/hr over 30 Minutes Intravenous NOW 03/06/15 1858 03/06/15 1934      Assessment/Plan: 1. Sepsis with cholangitis, cholelithiasis, choledocholithiais 1A. S/p ERCP 03/08/15 Dr. Deatra Ina 1B. ERCP with sphincterotomy/papillotomy, ERCP with removal of calculus/calculi , ERCP with stent placement, and ERCP with balloon dilation,03/10/15, Dr. Clarene Essex 2. S/p left Knee replacement, 02/17/15 3. Hx of prostate cancer 4. AODM 5. Sleep Apnea on CPAP   To OR for lap chole All risks and benefits were discussed with the patient to generally include: infection, bleeding, possible need for post op ERCP, damage to the bile ducts, and bile leak. Alternatives were offered and described.  All questions were answered and the patient voiced understanding of the procedure and wishes to proceed at this point with a laparoscopic cholecystectomy    LOS: 6 days    Rosario Jacks., [facility] 03/12/2015

## 2015-03-12 NOTE — Progress Notes (Signed)
PROGRESS NOTE  CORBET HANLEY XBD:[id number] DOB: 1945-12-22 DOA: 03/06/2015 PCP: No primary care provider on file.  Summary: 69 year old man presented with right upper quadrant pain, fever, confusion and hypotension. Abdominal ultrasound revealed dilated common bile duct, CT abdomen and pelvis showed cholelithiasis with dilated common bile duct and intrahepatic ductal dilatation. Seen by GI, underwent ERCP 5/1 with stone removal and sphincterotomy. Bilirubin continued to rise, ERCP repeated 5/3 with recommendations to consider cholecystectomy.  Assessment/Plan: 1. Sepsis secondary to Escherichia coli bacteremia secondary to cholangitis/choledocholithiasis and ischemic/necrotic cholecystitis . Status post cholecystectomy 5/5. 2. Elevated LFTs presumed secondary to cholangitis, cholecystitis. Improving. 3. Acute encephalopathy, stable. Secondary to acute illness, postoperative. 4. Diabetes mellitus type 2. Remained stable. Continue low-dose Lantus and sliding scale insulin. Hemoglobin A1c 7.3. 5. History left total knee replacement. Physical therapy when able. 6. Obstructive sleep apnea. Stable. 7. Pulmonary nodules, incidental finding seen on CT. Outpatient follow-up recommended with chest CT 6-12 months.   Seems to be doing well status post cholecystectomy.  Continue antibiotics. Monitor hemodynamics.  CBC and complete metabolic panel in the morning.  Discussed with wife at bedside.  Code Status: full code DVT prophylaxis: heparin Family Communication:  Disposition Plan: home when improved  [name], MD  [facility]  Pager [phone] If 7PM-7AM, please contact night-coverage at [email or web address], password [facility] 03/12/2015, 6:01 PM  LOS: 6 days   Consultants:  GI  Procedures:  5/1 ERCP  5/3 repeat ERCP ENDOSCOPIC IMPRESSION:1. Normal ampullawith previous sphincterotomy site status post increased sphincterotomy as above 2. No pancreatic duct injections or wires  advancement throughout the procedure 3. Few small stones removed4. Multiple negative balloon pull through's and negative occlusion cholangiogram 5. No obvious straining status post balloon dilation as above without better drainage status post stent as above  Laparoscopic cholecystectomy  Antibiotics:  Zosyn 4/29 >> 5/4  Ceftriaxone 5/4 >>  HPI/Subjective: Seen status post cholecystectomy.  Wife at bedside. Patient's a bit confused seems to be feeling okay.  Objective: Filed Vitals:   03/12/15 1351 03/12/15 1426 03/12/15 1500 03/12/15 1655  BP: 176/70 162/62    Pulse: 82 89 86 84  Temp:  98.8 F (37.1 C)    TempSrc:      Resp: 27 31 22 29   Height:      Weight:      SpO2: 93% 94% 94%     Intake/Output Summary (Last 24 hours) at 03/12/15 1801 Last data filed at 03/12/15 1357  Gross per 24 hour  Intake   3150 ml  Output   1220 ml  Net   1930 ml     Filed Weights   03/07/15 0446 03/09/15 0600 03/10/15 0400  Weight: 126.5 kg (278 lb 14.1 oz) 126.4 kg (278 lb 10.6 oz) 130.8 kg (288 lb 5.8 oz)    Exam:     Afebrile 24 hours, vital signs stable. General:  Appears groggy but comfortable Cardiovascular: RRR, no m/r/g.  Respiratory: CTA bilaterally, no w/r/r. Normal respiratory effort. Psychiatric: grossly normal mood and affect, speech fluent and appropriate. Slightly confused.  New data reviewed:  Potassium 3.1. Complete metabolic panel otherwise notable for slightly improved alkaline phosphatase, AST, ALT, total bilirubin.  WBC modestly improved, 13.2. Hemoglobin stable 10.2.  Pertinent data since admission:  Blood culture Escherichia coli sensitive to ceftriaxone.  Pending data:    Scheduled Meds: . antiseptic oral rinse  7 mL Mouth Rinse BID  . cefTRIAXone (ROCEPHIN)  IV  2 g Intravenous Q24H  . dorzolamide-timolol  1 drop  Both Eyes BID  . fluticasone  1 spray Each Nare Daily  . heparin subcutaneous  5,000 Units Subcutaneous 3 times per day  .  HYDROmorphone      . indomethacin  100 mg Rectal Once  . insulin aspart  0-9 Units Subcutaneous 6 times per day  . insulin glargine  4 Units Subcutaneous Daily  . labetalol      . venlafaxine XR  75 mg Oral Daily   Continuous Infusions: . sodium chloride 0.9 % 1,000 mL with potassium chloride 20 mEq infusion 100 mL/hr at 03/11/15 1058    Principal Problem:   Sepsis due to Escherichia coli Active Problems:   Cholangitis   HTN (hypertension)   DM w/o complication type II   Encephalopathy due to infection   Sleep apnea   Choledocholithiasis   Time spent 20 minutes

## 2015-03-12 NOTE — Op Note (Signed)
03/12/2015  11:26 AM  PATIENT:  Justin Cohen  69 y.o. male  PRE-OPERATIVE DIAGNOSIS:  gallstones  POST-OPERATIVE DIAGNOSIS:  Ischemic/necrotic acute cholecystitis   PROCEDURE:  Procedure(s): LAPAROSCOPIC CHOLECYSTECTOMY  (N/A)  SURGEON:  Surgeon(s) and Role:    * Ralene Ok, MD - Primary  ANESTHESIA:   local and general  EBL:10cc Total I/O In: 1000 [I.V.:1000] Out: 750 [Urine:750]  BLOOD ADMINISTERED:none  DRAINS: (19Fr) Jackson-Pratt drain(s) with closed bulb suction in the RUQ/gallbladder fossa   LOCAL MEDICATIONS USED:  BUPIVICAINE   SPECIMEN:  Source of Specimen:  gallbladder  DISPOSITION OF SPECIMEN:  PATHOLOGY  COUNTS:  YES  TOURNIQUET:  * No tourniquets in log *  DICTATION: .Dragon Dictation The patient was taken to the operating and placed in the supine position with bilateral SCDs in place. The patient was prepped and draped in the usual sterile fashion. A time out was called and all facts were verified. A pneumoperitoneum was obtained via A Veress needle technique to a pressure of 40mm of mercury.  A 78mm trochar was then placed in the right upper quadrant under visualization, and there were no injuries to any abdominal organs.  There is large amount of omental as well as small bowel adhesions to the anterior abdominal wall walling off the gallbladder. There also appeared to be bile staining in bile within the abdomen.  A 11 mm port was then placed in the umbilical region after infiltrating with local anesthesia under direct visualization. A second  port and right lower quadrant port placement under direct visualization, unfortunately secondary to the placement of the mesh this had to be incised to help with port placement .  at this time I proceeded to bluntly dissect away the omentum from the anterior abdominal wall, as well as small bowel. These were loose adhesions and this easily came down. It should be noted there was a mesh that was also well  incorporated. Omentum was up on the mesh in the medial portion of the mesh. This was cauterized off of the mesh.  Once this was taken down a 5 mm port was in placed at the epigastrium under direct visualization.  The gallbladder was identified and seen to be ischemic at the dome. There also appeared to be leakage of bile coming from the gallbladder. At this time the contents of the gallbladder were suctioned out. This allowed [place] to grab and retracted, the peritoneum dense adhesions was then sharplyin bluntly  dissected from the gallbladder and this dissection was carried down to Calot's triangle.  should be noted there was a large amount of inflammation to the body and fundus of the gallbladder. The cystic duct  was short and surrounded by dense inflammation. It was dissected away circumferentially and seen going into the gallbladder 360, the critical angle was obtained.  2 clips were placed proximally one distally and the cystic duct transected. The cystic artery was identified and 2 clips placed proximally and one distally and transected. We then proceeded to remove the gallbladder off the hepatic fossa with Bovie cautery. There was some spillage of bile contents into the abdomen. This is due secondary to retraction and the necrotic portion of the gallbladder . A retrieval bag was then placed in the abdomen and gallbladder placed in the bag. The hepatic fossa was then reexamined and hemostasis was achieved with Bovie cautery, also still fibrillar hemostatic was also placed in the gallbladder fossa,  and was excellent at the end of the case. The subhepatic fossa  and perihepatic fossa was then irrigated with copious saline until the effluent was clear. all blood clots were suctioned out of the abdomen.   At this time a 41 [place] Blake drain was placed in the subhepatic fossa. This was brought out through the right upper quadrant 5 mm port site.    The 11 mm trocar fascia was reapproximated with the Endo Close  #1 Vicryl 3 in the bag and specimen were extracted via the 11 mm port site.  The pneumoperitoneum was evacuated and all trochars removed under direct visulalization. The skin was then closed with 4-0 Monocryl and the skin dressed with Steri-Strips, gauze, and tape. The patient was awaken from general anesthesia and taken to the recovery room in stable condition.   PLAN OF CARE: Admit to inpatient   PATIENT DISPOSITION:  PACU - hemodynamically stable.   Delay start of Pharmacological VTE agent (>24hrs) due to surgical blood loss or risk of bleeding: not applicable

## 2015-03-12 NOTE — Progress Notes (Signed)
Pt has already been placed on CPAP set at [facility] with 8Lpm of oxygen bled in via home nasal pillows. RT added sterile water for humidification. Pt is resting comfortably and tolerating CPAP well at this time. RT will continue to monitor as needed.

## 2015-03-12 NOTE — Progress Notes (Signed)
Patient placed on cpap with auto titrate with a 4l oxygen bleed in. Patient resting well. No complications. Vital signs stable at this time. RT will continue to monitor.

## 2015-03-12 NOTE — Anesthesia Procedure Notes (Signed)
Procedure Name: Intubation Date/Time: 03/12/2015 9:04 AM Performed by: Johnathan Hausen A Pre-anesthesia Checklist: Patient identified, Emergency Drugs available, Suction available, Patient being monitored and Timeout performed Patient Re-evaluated:Patient Re-evaluated prior to inductionOxygen Delivery Method: Circle system utilized Preoxygenation: Pre-oxygenation with 100% oxygen Intubation Type: IV induction Ventilation: Mask ventilation without difficulty Laryngoscope Size: Mac and 4 Grade View: Grade I Tube type: Oral Tube size: 8.0 mm Number of attempts: 1 Airway Equipment and Method: Stylet Placement Confirmation: ETT inserted through vocal cords under direct vision,  positive ETCO2 and breath sounds checked- equal and bilateral Secured at: 23 cm Tube secured with: Tape Dental Injury: Teeth and Oropharynx as per pre-operative assessment

## 2015-03-12 NOTE — Care Management Note (Signed)
  Page 1 of 1   03/12/2015     2:38:25 PM CARE MANAGEMENT NOTE 03/12/2015  Patient:  Justin Cohen, Justin Cohen   Account Number:  [id number]  Date Initiated:  03/12/2015  Documentation initiated by:  DAVIS,RHONDA  Subjective/Objective Assessment:   [id number] or for emergent lap cholecystectomy post op -htn-iv labetol given, in sdu to monitor     Action/Plan:   tbd   Anticipated DC Date:  03/15/2015   Anticipated DC Plan:  HOME/SELF CARE  In-house referral  NA      DC Planning Services  CM consult      PAC Choice  NA   Choice offered to / List presented to:  NA           Status of service:  In process, will continue to follow Medicare Important Message given?   (If response is "NO", the following Medicare IM given date fields will be blank) Date Medicare IM given:   Medicare IM given by:   Date Additional Medicare IM given:   Additional Medicare IM given by:    Discharge Disposition:    Per UR Regulation:  Reviewed for med. necessity/level of care/duration of stay  If discussed at [place] of Stay Meetings, dates discussed:    Comments:  Date:  Mar 12, 2015 U.R. performed for needs and level of care. Will continue to follow for Case Management needs.  Velva Harman, RN, BSN, [place]   [phone]

## 2015-03-12 NOTE — Transfer of Care (Signed)
Immediate Anesthesia Transfer of Care Note  Patient: Justin Cohen  Procedure(s) Performed: Procedure(s): LAPAROSCOPIC CHOLECYSTECTOMY CHOLANGIOGRAM WAS NOT PERFORMED (N/A)  Patient Location: PACU  Anesthesia Type:General  Level of Consciousness: awake, alert  and oriented  Airway & Oxygen Therapy: Patient Spontanous Breathing and Patient connected to face mask oxygen  Post-op Assessment: Report given to RN and Post -op Vital signs reviewed and stable  Post vital signs: Reviewed and stable  Last Vitals:  Filed Vitals:   03/12/15 0534  BP: 177/78  Pulse: 84  Temp: 37.5 C  Resp: 22    Complications: No apparent anesthesia complications

## 2015-03-12 NOTE — Progress Notes (Signed)
Joyce Copa 8:49 AM  Subjective: Patient doing better today and about to have surgery and his case was discussed with the surgical team  Objective: Vital signs stable afebrile no acute distress abdomen decreased tender liver tests decreased white count trending down  Assessment: CBD stones and stricture status post stent  Plan: I've asked the surgical team to proceed with a Intra-Op cholangiogram if able just to better evaluate the CBD and the stent positioning and function just to be sure and I will check on that later today and please call us sooner when necessary  [facility]  Pager [phone] After 5PM or if no answer call [phone]

## 2015-03-12 NOTE — Progress Notes (Signed)
Dr. Delma Post in to see patient- aware of patient's mental status - made aware of vital signs- O.K. To go to floor

## 2015-03-12 NOTE — Anesthesia Postprocedure Evaluation (Signed)
  Anesthesia Post-op Note  Patient: Justin Cohen  Procedure(s) Performed: Procedure(s) (LRB): LAPAROSCOPIC CHOLECYSTECTOMY CHOLANGIOGRAM WAS NOT PERFORMED (N/A)  Patient Location: PACU  Anesthesia Type: General  Level of Consciousness: awake and alert   Airway and Oxygen Therapy: Patient Spontanous Breathing  Post-op Pain: mild  Post-op Assessment: Post-op Vital signs reviewed, Patient's Cardiovascular Status Stable, Respiratory Function Stable, Patent Airway and No signs of Nausea or vomiting  Last Vitals:  Filed Vitals:   03/12/15 1426  BP: 162/62  Pulse: 89  Temp: 37.1 C  Resp: 31    Post-op Vital Signs: stable   Complications: No apparent anesthesia complications. Preoperative room air saturation was 91%. Requiring increased oxygen in PACU. No dyspnea. Some confusion similar to preoperative state.

## 2015-03-12 NOTE — Progress Notes (Signed)
Dr. Delma Post made aware of patient's blood pressures and heart rates- orders given.- Labetalol 5 mg IVP given

## 2015-03-13 ENCOUNTER — Encounter (HOSPITAL_COMMUNITY): Payer: Self-pay | Admitting: General Surgery

## 2015-03-13 DIAGNOSIS — K810 Acute cholecystitis: Secondary | ICD-10-CM

## 2015-03-13 LAB — GLUCOSE, CAPILLARY
GLUCOSE-CAPILLARY: 192 mg/dL — AB (ref 70–99)
GLUCOSE-CAPILLARY: 197 mg/dL — AB (ref 70–99)
GLUCOSE-CAPILLARY: 190 mg/dL — AB (ref 70–99)
Glucose-Capillary: 180 mg/dL — ABNORMAL HIGH (ref 70–99)
Glucose-Capillary: 184 mg/dL — ABNORMAL HIGH (ref 70–99)

## 2015-03-13 LAB — BASIC METABOLIC PANEL
ANION GAP: 6 (ref 5–15)
BUN: 22 mg/dL — ABNORMAL HIGH (ref 6–20)
CALCIUM: 8.2 mg/dL — AB (ref 8.9–10.3)
CO2: 25 mmol/L (ref 22–32)
CREATININE: 0.82 mg/dL (ref 0.61–1.24)
Chloride: 109 mmol/L (ref 101–111)
GFR calc Af Amer: >60 mL/min (ref >60)
GFR calc non Af Amer: >60 mL/min (ref >60)
Glucose, Bld: 216 mg/dL — ABNORMAL HIGH (ref 70–99)
Potassium: 4.1 mmol/L (ref 3.5–5.1)
Sodium: 140 mmol/L (ref 135–145)

## 2015-03-13 LAB — GLYCOHEMOGLOBIN, TOTAL: Hemoglobin-A1c: 9.2 % — ABNORMAL HIGH (ref 5.4–7.4)

## 2015-03-13 LAB — CBC
HEMATOCRIT: 28.5 % — AB (ref 39.0–52.0)
Hemoglobin: 9.8 g/dL — ABNORMAL LOW (ref 13.0–17.0)
MCH: 28.7 pg (ref 26.0–34.0)
MCHC: 34.4 g/dL (ref 30.0–36.0)
MCV: 83.3 fL (ref 78.0–100.0)
PLATELETS: 307 10*3/uL (ref 150–400)
RBC: 3.42 MIL/uL — ABNORMAL LOW (ref 4.22–5.81)
RDW: 14.4 % (ref 11.5–15.5)
WBC: 15.9 10*3/uL — ABNORMAL HIGH (ref 4.0–10.5)

## 2015-03-13 MED ORDER — VITAMINS A & D EX OINT
TOPICAL_OINTMENT | CUTANEOUS | Status: AC
Start: 2015-03-13 — End: 2015-03-13
  Administered 2015-03-13: 08:00:00
  Filled 2015-03-13: qty 5

## 2015-03-13 MED ORDER — MORPHINE SULFATE 2 MG/ML IJ SOLN
2.0000 mg | INTRAMUSCULAR | Status: DC | PRN
  Administered 2015-03-13 – 2015-03-16 (×4): 2 mg via INTRAVENOUS
  Filled 2015-03-13 (×4): qty 1

## 2015-03-13 NOTE — Progress Notes (Signed)
PROGRESS NOTE  Justin Cohen QIH:[id number] DOB: 1945/11/28 DOA: 03/06/2015 PCP: No primary care provider on file.  Summary: 69 year old man presented with right upper quadrant pain, fever, confusion and hypotension. Abdominal ultrasound revealed dilated common bile duct, CT abdomen and pelvis showed cholelithiasis with dilated common bile duct and intrahepatic ductal dilatation. Seen by GI, underwent ERCP 5/1 with stone removal and sphincterotomy. Bilirubin continued to rise, ERCP repeated 5/3 with recommendations to consider cholecystectomy.  Assessment/Plan: 1. Sepsis secondary to Escherichia coli bacteremia secondary to cholangitis/choledocholithiasis and ischemic/necrotic cholecystitis. Status post cholecystectomy 5/5. Slowly improving. 2. Elevated LFTs presumed secondary to cholangitis, cholecystitis.   3. Acute encephalopathy. Mild. Secondary to acute illness, post surgery. 4. Diabetes mellitus type 2. Remains stable. Continue low-dose Lantus and sliding scale insulin. Hemoglobin A1c 7.3. Resume oral therapy on discharge. 5. S/p left total knee replacement. Physical therapy when able. 6. Obstructive sleep apnea. CPAP at night. 7. Pulmonary nodules, incidental finding seen on CT. Outpatient follow-up recommended with chest CT 6-12 months.   Overall improving. Plan to continue IV antibiotics. Management per surgery.  Anticipate transfer to medical bed 5/7.  CBC and complete metabolic panel in the morning.  Code Status: full code DVT prophylaxis: heparin Family Communication:  Disposition Plan: home when improved  [name], MD  [facility]  Pager [phone] If 7PM-7AM, please contact night-coverage at [email or web address], password [facility] 03/13/2015, 7:24 AM  LOS: 7 days   Consultants:  GI  Procedures:  5/1 ERCP  5/3 repeat ERCP ENDOSCOPIC IMPRESSION:1. Normal ampullawith previous sphincterotomy site status post increased sphincterotomy as above 2. No  pancreatic duct injections or wires advancement throughout the procedure 3. Few small stones removed4. Multiple negative balloon pull through's and negative occlusion cholangiogram 5. No obvious straining status post balloon dilation as above without better drainage status post stent as above  Laparoscopic cholecystectomy  Antibiotics:  Zosyn 4/29 >> 5/4  Ceftriaxone 5/4 >>  HPI/Subjective: Seen status post cholecystectomy. No issues charted overnight.  Overall feeling better.  Objective: Filed Vitals:   03/13/15 0400 03/13/15 0500 03/13/15 0600 03/13/15 0700  BP: 149/51 145/62 147/47 159/64  Pulse: 56 52 52 51  Temp: 98.4 F (36.9 C)     TempSrc: Oral     Resp: 25 17 20 18   Height:      Weight:      SpO2: 91% 92% 92%     Intake/Output Summary (Last 24 hours) at 03/13/15 0724 Last data filed at 03/13/15 0700  Gross per 24 hour  Intake   3250 ml  Output   1600 ml  Net   1650 ml     Filed Weights   03/07/15 0446 03/09/15 0600 03/10/15 0400  Weight: 126.5 kg (278 lb 14.1 oz) 126.4 kg (278 lb 10.6 oz) 130.8 kg (288 lb 5.8 oz)    Exam:     Afebrile 48 hours, VSS General:  Appears comfortable, calm. Cardiovascular: Regular rate and rhythm, no murmur, rub or gallop. No lower extremity edema. Telemetry: Sinus rhythm, no arrhythmias  Respiratory: Clear to auscultation bilaterally, no wheezes, rales or rhonchi. Normal respiratory effort. Musculoskeletal: grossly normal tone bilateral upper and lower extremities Psychiatric: grossly normal mood and affect, speech fluent and appropriate  New data reviewed:  BMP unremarkable   WBC 15.9. Hemoglobin stable 9.8.  Pertinent data since admission:  Blood culture Escherichia coli sensitive to ceftriaxone.  Pending data:    Scheduled Meds: . antiseptic oral rinse  7 mL Mouth Rinse BID  . cefTRIAXone (ROCEPHIN)  IV  2 g Intravenous Q24H  . dorzolamide-timolol  1 drop Both Eyes BID  . fluticasone  1 spray Each  Nare Daily  . heparin subcutaneous  5,000 Units Subcutaneous 3 times per day  . indomethacin  100 mg Rectal Once  . insulin aspart  0-9 Units Subcutaneous 6 times per day  . insulin glargine  4 Units Subcutaneous Daily  . venlafaxine XR  75 mg Oral Daily   Continuous Infusions: . sodium chloride 0.9 % 1,000 mL with potassium chloride 20 mEq infusion 100 mL/hr at 03/13/15 0000    Principal Problem:   Sepsis due to Escherichia coli Active Problems:   Cholangitis   HTN (hypertension)   DM w/o complication type II   Encephalopathy due to infection   Sleep apnea   Choledocholithiasis   Time spent 20 minutes

## 2015-03-13 NOTE — Progress Notes (Signed)
1 Day Post-Op  Subjective: Pt doing well.  Thirsty  Objective: Vital signs in last 24 hours: Temp:  [97.7 F (36.5 C)-98.8 F (37.1 C)] 98.4 F (36.9 C) (05/06 0400) Pulse Rate:  [51-98] 51 (05/06 0700) Resp:  [16-31] 18 (05/06 0700) BP: (133-193)/(39-85) 159/64 mmHg (05/06 0700) SpO2:  [89 %-96 %] 92 % (05/06 0600) Last BM Date: 03/12/15  Intake/Output from previous day: 05/05 0701 - 05/06 0700 In: 3250 [I.V.:3150; IV Piggyback:100] Out: 2350 [Urine:2200; Drains:150] Intake/Output this shift:    General appearance: alert and cooperative GI: soft, approp ttp, ND, wounds c/d/i  Lab Results:   Recent Labs  03/12/15 0510 03/13/15 0330  WBC 13.2* 15.9*  HGB 10.2* 9.8*  HCT 29.1* 28.5*  PLT 351 307   BMET  Recent Labs  03/12/15 0510 03/13/15 0330  NA 136 140  K 3.1* 4.1  CL 103 109  CO2 25 25  GLUCOSE 156* 216*  BUN 21* 22*  CREATININE 0.73 0.82  CALCIUM 8.0* 8.2*   PT/INR No results for input(s): LABPROT, INR in the last 72 hours. ABG No results for input(s): PHART, HCO3 in the last 72 hours.  Invalid input(s): PCO2, PO2  Studies/Results: Dg Abd 1 View  03/11/2015   CLINICAL DATA:  Abdominal pain.  EXAM: ABDOMEN - 1 VIEW  COMPARISON:  CT 03/06/2015.  FINDINGS: Biliary catheter not right. Surgical clips of the right. Minimally distended air-filled loops of small bowel. Colonic gas pattern unremarkable. Oral contrast in the colon . Mild adynamic ileus cannot be completely excluded. Degenerative changes lumbar spine .  IMPRESSION: 1. Biliary drainage catheter noted over the right upper quadrant. 2. Air-filled loops of small and large bowel noted. Minimal distention. Oral contrast in the colon. Mild adynamic ileus cannot be excluded P   Electronically Signed   By: Morrison   On: 03/11/2015 09:46    Anti-infectives: Anti-infectives    Start     Dose/Rate Route Frequency Ordered Stop   03/12/15 0945  metroNIDAZOLE (FLAGYL) IVPB 500 mg     500 mg 100  mL/hr over 60 Minutes Intravenous  Once 03/12/15 0939 03/12/15 0945   03/11/15 1600  cefTRIAXone (ROCEPHIN) 2 g in dextrose 5 % 50 mL IVPB - Premix     2 g 100 mL/hr over 30 Minutes Intravenous Every 24 hours 03/11/15 0941     03/07/15 1800  vancomycin (VANCOCIN) IVPB 1000 mg/200 mL premix  Status:  Discontinued     1,000 mg 200 mL/hr over 60 Minutes Intravenous Every 12 hours 03/07/15 0551 03/07/15 1524   03/07/15 0600  piperacillin-tazobactam (ZOSYN) IVPB 3.375 g  Status:  Discontinued     3.375 g 12.5 mL/hr over 240 Minutes Intravenous 3 times per day 03/07/15 0449 03/11/15 0941   03/07/15 0530  vancomycin (VANCOCIN) 2,500 mg in sodium chloride 0.9 % 500 mL IVPB     2,500 mg 250 mL/hr over 120 Minutes Intravenous  Once 03/07/15 0456 03/07/15 0749   03/06/15 2315  piperacillin-tazobactam (ZOSYN) IVPB 3.375 g  Status:  Discontinued     3.375 g 100 mL/hr over 30 Minutes Intravenous 3 times per day 03/06/15 2309 03/07/15 0449   03/06/15 2100  imipenem-cilastatin (PRIMAXIN) 500 mg in sodium chloride 0.9 % 100 mL IVPB     500 mg 200 mL/hr over 30 Minutes Intravenous  Once 03/06/15 2008 03/06/15 2130   03/06/15 1900  cefTRIAXone (ROCEPHIN) 1 g in dextrose 5 % 50 mL IVPB     1 g 100  mL/hr over 30 Minutes Intravenous NOW 03/06/15 1858 03/06/15 1934      Assessment/Plan: s/p Procedure(s): LAPAROSCOPIC CHOLECYSTECTOMY CHOLANGIOGRAM WAS NOT PERFORMED (N/A) Con't abx for infection OK for 1/2cup ice chips / 4hrs Mobilize   LOS: 7 days    Rosario Jacks., Anne Hahn 03/13/2015

## 2015-03-13 NOTE — Progress Notes (Signed)
Physical Therapy Treatment Patient Details Name: RIGEL FILSINGER MRN: [id number] DOB: 1946/05/15 Today's Date: 03/13/2015    History of Present Illness 69 year old male presented with right upper quadrant pain, fever, confusion and hypotension.  Abdominal ultrasound revealed dilated common bile duct, CT abdomen and pelvis showed cholelithiasis with dilated common bile duct and intrahepatic ductal dilatation. Seen by GI, underwent ERCP 5/1 with stone removal and sphincterotomy. Recent L TKA 02/2015. S/p lap chole on 5/5.     PT Comments    Progressing with mobility. Some pain on R side of abdomen when mobilizing.   Follow Up Recommendations  Home health PT;Supervision - Intermittent     Equipment Recommendations  None recommended by PT    Recommendations for Other Services       Precautions / Restrictions Precautions Precautions: Fall Precaution Comments: JP drain Restrictions Weight Bearing Restrictions: No    Mobility  Bed Mobility               General bed mobility comments: pt oob in recliner  Transfers Overall transfer level: Needs assistance Equipment used: Rolling walker (2 wheeled) Transfers: Sit to/from Stand Sit to Stand: Min assist         General transfer comment: verbal cues for safety, cues for hand placement, assist to rise, steady.   Ambulation/Gait Ambulation/Gait assistance: Min assist Ambulation Distance (Feet): 200 Feet Assistive device: Rolling walker (2 wheeled) Gait Pattern/deviations: Step-through pattern;Trunk flexed     General Gait Details: Assist to stabilize intermittetly.    Stairs            Wheelchair Mobility    Modified Rankin (Stroke Patients Only)       Balance                                    Cognition Arousal/Alertness: Awake/alert Behavior During Therapy: WFL for tasks assessed/performed Overall Cognitive Status: Impaired/Different from baseline         Following Commands:  Follows one step commands with increased time     Problem Solving: Requires verbal cues;Requires tactile cues      Exercises Total Joint Exercises Ankle Circles/Pumps: AROM;Both;10 reps;Supine Quad Sets: AROM;Both;10 reps;Supine Hip ABduction/ADduction: AROM;Left;10 reps;Supine Knee Flexion: AAROM;Left;Seated Goniometric ROM: ~10-105 degrees    General Comments        Pertinent Vitals/Pain Pain Assessment: Faces Faces Pain Scale: Hurts little more Pain Location: R side of abdomen Pain Descriptors / Indicators: Aching;Sore Pain Intervention(s): Limited activity within patient's tolerance;Monitored during session    [place] expects to be discharged to:: Private residence Living Arrangements: Spouse/significant other Available Help at Discharge: Family         Home Equipment: Gilford Rile - 2 wheels Additional Comments: has DME from recent TKA sx    Prior Function Level of Independence: Needs assistance    ADL's / Homemaking Assistance Needed: wife assisted with sock and shoe     PT Goals (current goals can now be found in the care plan section) Progress towards PT goals: Progressing toward goals    Frequency  Min 3X/week    PT Plan Current plan remains appropriate    Co-evaluation             End of Session Equipment Utilized During Treatment: Oxygen Activity Tolerance: Patient tolerated treatment well Patient left: in chair;with call bell/phone within reach     Time: 1037-1101 PT Time Calculation (min) (ACUTE ONLY): 24 min  Charges:  $Gait Training: 8-22 mins                    G Codes:      Weston Anna, MPT Pager: [phone]

## 2015-03-13 NOTE — Evaluation (Signed)
Occupational Therapy Evaluation Patient Details Name: Justin Cohen MRN: [id number] DOB: 1946-08-26 Today's Date: 03/13/2015    History of Present Illness 69 year old male presented with right upper quadrant pain, fever, confusion and hypotension.  Abdominal ultrasound revealed dilated common bile duct, CT abdomen and pelvis showed cholelithiasis with dilated common bile duct and intrahepatic ductal dilatation. Seen by GI, underwent ERCP 5/1 with stone removal and sphincterotomy. Recent L TKA 02/2015. S/p lap chole on 5/5.    Clinical Impression   Pt was admitted for the above.  He will benefit from skilled OT in acute to increase safety and independence with toilet transfers.  Pt had min A with LB prior to admission, and while he needs more assistance, wife will provide this while he heals.      Follow Up Recommendations  Supervision/Assistance - 24 hour;No OT follow up    Equipment Recommendations  None recommended by OT    Recommendations for Other Services       Precautions / Restrictions Precautions Precautions: Fall Precaution Comments: JP drain Restrictions Weight Bearing Restrictions: No      Mobility Bed Mobility               General bed mobility comments: pt oob in recliner  Transfers Overall transfer level: Needs assistance Equipment used: Rolling walker (2 wheeled) Transfers: Sit to/from Stand Sit to Stand: Min assist         General transfer comment: verbal cues for safety, cues for hand placement, assist to rise, steady.     Balance                                            ADL Overall ADL's : Needs assistance/impaired     Grooming: Oral care;Min guard;Standing                   Toilet Transfer: Minimal assistance;Ambulation (chair)             General ADL Comments: Pt is uncomfortable in abdomen after lap chole, so he needs more assistance with LB adls:  max A for LB bathing and total A for LB dressing.   Pt needs min A for UB due to lines.  Wife helped at min A level for LB prior to admission.     Vision     Perception     Praxis      Pertinent Vitals/Pain Pain Assessment: Faces Faces Pain Scale: Hurts little more Pain Location: R side of abdomen Pain Descriptors / Indicators: Aching;Sore Pain Intervention(s): Limited activity within patient's tolerance;Monitored during session     Hand Dominance     Extremity/Trunk Assessment Upper Extremity Assessment Upper Extremity Assessment: Overall WFL for tasks assessed           Communication Communication Communication: No difficulties   Cognition Arousal/Alertness: Awake/alert Behavior During Therapy: WFL for tasks assessed/performed Overall Cognitive Status: Impaired/Different from baseline         Following Commands: Follows one step commands with increased time     Problem Solving: Requires verbal cues;Requires tactile cues     General Comments       Exercises       Shoulder Instructions      Home Living Family/patient expects to be discharged to:: Private residence Living Arrangements: Spouse/significant other Available Help at Discharge: Family  Home Equipment: Gilford Rile - 2 wheels   Additional Comments: has DME from recent TKA sx      Prior Functioning/Environment Level of Independence: Needs assistance    ADL's / Homemaking Assistance Needed: wife assisted with sock and shoe        OT Diagnosis: Generalized weakness   OT Problem List: Decreased strength;Decreased cognition;Pain;Decreased safety awareness   OT Treatment/Interventions: Self-care/ADL training;DME and/or AE instruction;Patient/family education    OT Goals(Current goals can be found in the care plan section) Acute Rehab OT Goals Patient Stated Goal: none stated; agreeable to therapy OT Goal Formulation: With patient Time For Goal Achievement: 03/20/15 Potential to Achieve Goals: Good ADL  Goals Pt Will Transfer to Toilet: with supervision;ambulating;bedside commode Additional ADL Goal #1: pt will perform bed mobility at supervision level, sidelying to sit, in preparation for toilet transfers Additional ADL Goal #2: Pt will not need cues for safety when performing transfers  OT Frequency: Min 2X/week   Barriers to D/C:            Co-evaluation PT/OT/SLP Co-Evaluation/Treatment: Yes Reason for Co-Treatment: For patient/therapist safety (lines) PT goals addressed during session: Mobility/safety with mobility OT goals addressed during session: ADL's and self-care      End of Session CPM Left Knee Additional Comments:  (Pt/Family refusing CPM at this time)  Activity Tolerance: Patient tolerated treatment well Patient left: in chair;with call bell/phone within reach   Time: 6546-5035 OT Time Calculation (min): 18 min Charges:  OT General Charges $OT Visit: 1 Procedure OT Evaluation $Initial OT Evaluation Tier I: 1 Procedure G-Codes:    Tyreck Bell 04-11-2015, 12:28 PM  Lesle Chris, OTR/L [phone] 2015-04-11

## 2015-03-14 LAB — GLUCOSE, CAPILLARY
GLUCOSE-CAPILLARY: 125 mg/dL — AB (ref 70–99)
Glucose-Capillary: 124 mg/dL — ABNORMAL HIGH (ref 70–99)
Glucose-Capillary: 130 mg/dL — ABNORMAL HIGH (ref 70–99)
Glucose-Capillary: 132 mg/dL — ABNORMAL HIGH (ref 70–99)
Glucose-Capillary: 147 mg/dL — ABNORMAL HIGH (ref 70–99)
Glucose-Capillary: 151 mg/dL — ABNORMAL HIGH (ref 70–99)

## 2015-03-14 LAB — CBC
HEMATOCRIT: 30.1 % — AB (ref 39.0–52.0)
HEMOGLOBIN: 10.1 g/dL — AB (ref 13.0–17.0)
MCH: 28.5 pg (ref 26.0–34.0)
MCHC: 33.6 g/dL (ref 30.0–36.0)
MCV: 84.8 fL (ref 78.0–100.0)
Platelets: 347 10*3/uL (ref 150–400)
RBC: 3.55 MIL/uL — ABNORMAL LOW (ref 4.22–5.81)
RDW: 14.6 % (ref 11.5–15.5)
WBC: 11.7 10*3/uL — AB (ref 4.0–10.5)

## 2015-03-14 LAB — COMPREHENSIVE METABOLIC PANEL
ALBUMIN: 2 g/dL — AB (ref 3.5–5.0)
ALT: 87 U/L — AB (ref 17–63)
AST: 86 U/L — ABNORMAL HIGH (ref 15–41)
Alkaline Phosphatase: 236 U/L — ABNORMAL HIGH (ref 38–126)
Anion gap: 7 (ref 5–15)
BUN: 21 mg/dL — ABNORMAL HIGH (ref 6–20)
CHLORIDE: 108 mmol/L (ref 101–111)
CO2: 27 mmol/L (ref 22–32)
CREATININE: 0.77 mg/dL (ref 0.61–1.24)
Calcium: 8.2 mg/dL — ABNORMAL LOW (ref 8.9–10.3)
GFR calc Af Amer: >60 mL/min (ref >60)
GFR calc non Af Amer: >60 mL/min (ref >60)
Glucose, Bld: 150 mg/dL — ABNORMAL HIGH (ref 70–99)
POTASSIUM: 3.4 mmol/L — AB (ref 3.5–5.1)
Sodium: 142 mmol/L (ref 135–145)
Total Bilirubin: 2.8 mg/dL — ABNORMAL HIGH (ref 0.3–1.2)
Total Protein: 5.3 g/dL — ABNORMAL LOW (ref 6.5–8.1)

## 2015-03-14 MED ORDER — SODIUM CHLORIDE 0.9 % IV SOLN
INTRAVENOUS | Status: DC
  Administered 2015-03-14: 09:00:00 via INTRAVENOUS

## 2015-03-14 MED ORDER — POTASSIUM CHLORIDE 10 MEQ/100ML IV SOLN
10.0000 meq | INTRAVENOUS | Status: AC
  Administered 2015-03-14 (×4): 10 meq via INTRAVENOUS
  Filled 2015-03-14 (×4): qty 100

## 2015-03-14 NOTE — Progress Notes (Signed)
PROGRESS NOTE  Justin Cohen JME:[id number] DOB: 10-05-1946 DOA: 03/06/2015 PCP: No primary care provider on file.  Summary: 69 year old man presented with right upper quadrant pain, fever, confusion and hypotension. Abdominal ultrasound revealed dilated common bile duct, CT abdomen and pelvis showed cholelithiasis with dilated common bile duct and intrahepatic ductal dilatation. Seen by GI, underwent ERCP 5/1 with stone removal and sphincterotomy. Bilirubin continued to rise, ERCP repeated 5/3 with recommendations to consider cholecystectomy. S/p cholecystectomy 5/5, gradually improving. Anticipate discharge next few days.  Assessment/Plan: 1. Sepsis secondary to Escherichia coli bacteremia secondary to cholangitis/choledocholithiasis and ischemic/necrotic cholecystitis. Status post cholecystectomy 5/5. Improving each day. 2. Elevated LFTs presumed secondary to cholangitis, cholecystitis. Slowly improving. 3. Acute encephalopathy. Secondary to acute illness, post surgery. Appears resolved. 4. Diabetes mellitus type 2. Stable. Continue low-dose Lantus and sliding scale insulin. Hemoglobin A1c 7.3. Resume oral therapy on discharge. 5. S/p left total knee replacement. Physical therapy. 6. Obstructive sleep apnea. CPAP QHS. 7. Pulmonary nodules, incidental finding seen on CT. Outpatient follow-up with chest CT 6-12 months.   Improving; hemodynamics stable, labs better. Diet per surgery.   Continue IV abx  CMP in AM  Transfer to medical floor  Replace potassium  Discussed with wife at bedside  Anticipate discharge next 48 hours.  Code Status: full code DVT prophylaxis: heparin Family Communication:  Disposition Plan: home when improved  [name], MD  [facility]  Pager [phone] If 7PM-7AM, please contact night-coverage at [email or web address], password [facility] 03/14/2015, 7:14 AM  LOS: 8 days   Consultants:  GI  PT--HH  OT--no follow-up,  supervision  Procedures:  5/1 ERCP  5/3 repeat ERCP ENDOSCOPIC IMPRESSION:1. Normal ampullawith previous sphincterotomy site status post increased sphincterotomy as above 2. No pancreatic duct injections or wires advancement throughout the procedure 3. Few small stones removed4. Multiple negative balloon pull through's and negative occlusion cholangiogram 5. No obvious straining status post balloon dilation as above without better drainage status post stent as above  Laparoscopic cholecystectomy  Antibiotics:  Zosyn 4/29 >> 5/4  Ceftriaxone 5/4 >>  HPI/Subjective: Poor sleep. Some RUQ pain. Overall feeling much better and hoping to go home soon. Hungry, wants diet.  Wife at bedside.  Objective: Filed Vitals:   03/13/15 2100 03/13/15 2301 03/14/15 0000 03/14/15 0400  BP: 154/52  166/42 178/54  Pulse: 53 52 52 55  Temp:   99 F (37.2 C) 98.2 F (36.8 C)  TempSrc:   Axillary Oral  Resp: 24 25 24 25   Height:      Weight:    129.9 kg (286 lb 6 oz)  SpO2: 93% 97% 95% 97%    Intake/Output Summary (Last 24 hours) at 03/14/15 0714 Last data filed at 03/14/15 0000  Gross per 24 hour  Intake    850 ml  Output   1370 ml  Net   -520 ml     Filed Weights   03/09/15 0600 03/10/15 0400 03/14/15 0400  Weight: 126.4 kg (278 lb 10.6 oz) 130.8 kg (288 lb 5.8 oz) 129.9 kg (286 lb 6 oz)    Exam:     Afebrile 72 hours, VSS General: Appears calm and comfortable. Appears better today. Cardiovascular: RRR, no m/r/g.  Respiratory: CTA bilaterally, no w/r/r. Normal respiratory effort. Psychiatric: grossly normal mood and affect, speech fluent and appropriate  New data reviewed:  UOP 1450  CBG stable  K+ 3.4  T bili down to 2.8, remainder of LFTs stable.  WBC down to 11.7. Hemoglobin stable 10.1  Pertinent  data since admission:  Blood culture Escherichia coli sensitive to ceftriaxone.  Pending data:    Scheduled Meds: . antiseptic oral rinse  7 mL Mouth Rinse  BID  . cefTRIAXone (ROCEPHIN)  IV  2 g Intravenous Q24H  . dorzolamide-timolol  1 drop Both Eyes BID  . fluticasone  1 spray Each Nare Daily  . heparin subcutaneous  5,000 Units Subcutaneous 3 times per day  . indomethacin  100 mg Rectal Once  . insulin aspart  0-9 Units Subcutaneous 6 times per day  . insulin glargine  4 Units Subcutaneous Daily  . venlafaxine XR  75 mg Oral Daily   Continuous Infusions: . sodium chloride 0.9 % 1,000 mL with potassium chloride 20 mEq infusion 100 mL/hr at 03/13/15 2250    Principal Problem:   Sepsis due to Escherichia coli Active Problems:   Cholangitis   HTN (hypertension)   DM w/o complication type II   Encephalopathy due to infection   Sleep apnea   Choledocholithiasis   Cholecystitis, acute   Time spent 20 minutes

## 2015-03-14 NOTE — Progress Notes (Signed)
Patient has some pleuritic right upper quadrant pain, but feels hungry and looks well overall. White count normal, liver chemistries stable and improved.  Operative report reviewed. Due to the severe inflammation, an intraoperative cholangiogram was not able to be obtained during his procedure.  Impression: Based on his clinical appearance and biochemical results, it appears that there has been successful clearance of the common duct.  Recommendation:  1. I will sign off at this time. However, if he bumps his liver chemistries or has other problems you think are related to the bile duct, or if we can otherwise be of further assistance in his care, please let us know.  2. The patient and his wife are aware that he needs stent removal several weeks from now, and that they should contact our office for that purpose if we do not contact them first.  Cleotis Nipper, M.D. Pager [phone] If no answer or after 5 PM call [phone]

## 2015-03-14 NOTE — Progress Notes (Signed)
2 Days Post-Op  Subjective: Pt doing well.  "ready to go home".  No nausea, having flatus  Objective: Vital signs in last 24 hours: Temp:  [98.2 F (36.8 C)-99 F (37.2 C)] 98.2 F (36.8 C) (05/07 0400) Pulse Rate:  [48-58] 55 (05/07 0400) Resp:  [14-29] 25 (05/07 0400) BP: (134-182)/(38-56) 178/54 mmHg (05/07 0400) SpO2:  [92 %-99 %] 97 % (05/07 0400) Weight:  [129.9 kg (286 lb 6 oz)] 129.9 kg (286 lb 6 oz) (05/07 0400) Last BM Date: 03/12/15  Intake/Output from previous day: 05/06 0701 - 05/07 0700 In: 850 [I.V.:800; IV Piggyback:50] Out: 5993 [Urine:1450; Drains:20] Intake/Output this shift:    General appearance: alert and cooperative GI: soft, approp ttp, ND, R lower dressings saturated JP: SS fluid Lab Results:   Recent Labs  03/13/15 0330 03/14/15 0345  WBC 15.9* 11.7*  HGB 9.8* 10.1*  HCT 28.5* 30.1*  PLT 307 347   BMET  Recent Labs  03/13/15 0330 03/14/15 0345  NA 140 142  K 4.1 3.4*  CL 109 108  CO2 25 27  GLUCOSE 216* 150*  BUN 22* 21*  CREATININE 0.82 0.77  CALCIUM 8.2* 8.2*   PT/INR No results for input(s): LABPROT, INR in the last 72 hours. ABG No results for input(s): PHART, HCO3 in the last 72 hours.  Invalid input(s): PCO2, PO2  Studies/Results: No results found.  Anti-infectives: Anti-infectives    Start     Dose/Rate Route Frequency Ordered Stop   03/12/15 0945  metroNIDAZOLE (FLAGYL) IVPB 500 mg     500 mg 100 mL/hr over 60 Minutes Intravenous  Once 03/12/15 0939 03/12/15 0945   03/11/15 1600  cefTRIAXone (ROCEPHIN) 2 g in dextrose 5 % 50 mL IVPB - Premix     2 g 100 mL/hr over 30 Minutes Intravenous Every 24 hours 03/11/15 0941     03/07/15 1800  vancomycin (VANCOCIN) IVPB 1000 mg/200 mL premix  Status:  Discontinued     1,000 mg 200 mL/hr over 60 Minutes Intravenous Every 12 hours 03/07/15 0551 03/07/15 1524   03/07/15 0600  piperacillin-tazobactam (ZOSYN) IVPB 3.375 g  Status:  Discontinued     3.375 g 12.5 mL/hr over  240 Minutes Intravenous 3 times per day 03/07/15 0449 03/11/15 0941   03/07/15 0530  vancomycin (VANCOCIN) 2,500 mg in sodium chloride 0.9 % 500 mL IVPB     2,500 mg 250 mL/hr over 120 Minutes Intravenous  Once 03/07/15 0456 03/07/15 0749   03/06/15 2315  piperacillin-tazobactam (ZOSYN) IVPB 3.375 g  Status:  Discontinued     3.375 g 100 mL/hr over 30 Minutes Intravenous 3 times per day 03/06/15 2309 03/07/15 0449   03/06/15 2100  imipenem-cilastatin (PRIMAXIN) 500 mg in sodium chloride 0.9 % 100 mL IVPB     500 mg 200 mL/hr over 30 Minutes Intravenous  Once 03/06/15 2008 03/06/15 2130   03/06/15 1900  cefTRIAXone (ROCEPHIN) 1 g in dextrose 5 % 50 mL IVPB     1 g 100 mL/hr over 30 Minutes Intravenous NOW 03/06/15 1858 03/06/15 1934      Assessment/Plan: s/p Procedure(s): LAPAROSCOPIC CHOLECYSTECTOMY CHOLANGIOGRAM WAS NOT PERFORMED (N/A) Con't abx for intra-abd infection Will try some limited clears today Labs trending in the right direction, will follow for now [facility] for transfer to floor Mobilize   LOS: 8 days    [place], Blen Ransome C. 03/13/176

## 2015-03-15 LAB — COMPREHENSIVE METABOLIC PANEL
ALT: 102 U/L — ABNORMAL HIGH (ref 17–63)
ANION GAP: 11 (ref 5–15)
AST: 83 U/L — ABNORMAL HIGH (ref 15–41)
Albumin: 2.3 g/dL — ABNORMAL LOW (ref 3.5–5.0)
Alkaline Phosphatase: 272 U/L — ABNORMAL HIGH (ref 38–126)
BUN: 15 mg/dL (ref 6–20)
CALCIUM: 8.2 mg/dL — AB (ref 8.9–10.3)
CO2: 25 mmol/L (ref 22–32)
CREATININE: 0.77 mg/dL (ref 0.61–1.24)
Chloride: 101 mmol/L (ref 101–111)
GLUCOSE: 165 mg/dL — AB (ref 70–99)
Potassium: 3.1 mmol/L — ABNORMAL LOW (ref 3.5–5.1)
Sodium: 137 mmol/L (ref 135–145)
Total Bilirubin: 3.5 mg/dL — ABNORMAL HIGH (ref 0.3–1.2)
Total Protein: 6.1 g/dL — ABNORMAL LOW (ref 6.5–8.1)

## 2015-03-15 LAB — GLUCOSE, CAPILLARY
GLUCOSE-CAPILLARY: 94 mg/dL (ref 70–99)
GLUCOSE-CAPILLARY: 108 mg/dL — AB (ref 70–99)
GLUCOSE-CAPILLARY: 167 mg/dL — AB (ref 70–99)
GLUCOSE-CAPILLARY: 157 mg/dL — AB (ref 70–99)
GLUCOSE-CAPILLARY: 147 mg/dL — AB (ref 70–99)
Glucose-Capillary: 73 mg/dL (ref 70–99)

## 2015-03-15 MED ORDER — POTASSIUM CHLORIDE CRYS ER 20 MEQ PO TBCR
40.0000 meq | EXTENDED_RELEASE_TABLET | Freq: Two times a day (BID) | ORAL | Status: AC
  Administered 2015-03-15 (×2): 40 meq via ORAL
  Filled 2015-03-15 (×2): qty 2

## 2015-03-15 NOTE — Progress Notes (Signed)
3 Days Post-Op  Subjective: No complaints  Objective: Vital signs in last 24 hours: Temp:  [97.6 F (36.4 C)-98.7 F (37.1 C)] 98.3 F (36.8 C) (05/08 0605) Pulse Rate:  [57-78] 78 (05/08 0605) Resp:  [20-22] 20 (05/08 0605) BP: (157-169)/(59-70) 168/70 mmHg (05/08 0605) SpO2:  [94 %-97 %] 97 % (05/08 0605) Last BM Date: 03/14/15  Intake/Output from previous day: 05/07 0701 - 05/08 0700 In: 1149.2 [P.O.:600; I.V.:349.2; IV Piggyback:200] Out: 5405 [Urine:5350; Drains:55] Intake/Output this shift: Total I/O In: 360 [P.O.:360] Out: 550 [Urine:550]  Resp: clear to auscultation bilaterally Cardio: regular rate and rhythm GI: soft, only tender at drain  Lab Results:   Recent Labs  03/13/15 0330 03/14/15 0345  WBC 15.9* 11.7*  HGB 9.8* 10.1*  HCT 28.5* 30.1*  PLT 307 347   BMET  Recent Labs  03/14/15 0345 03/15/15 1010  NA 142 137  K 3.4* 3.1*  CL 108 101  CO2 27 25  GLUCOSE 150* 165*  BUN 21* 15  CREATININE 0.77 0.77  CALCIUM 8.2* 8.2*   PT/INR No results for input(s): LABPROT, INR in the last 72 hours. ABG No results for input(s): PHART, HCO3 in the last 72 hours.  Invalid input(s): PCO2, PO2  Studies/Results: No results found.  Anti-infectives: Anti-infectives    Start     Dose/Rate Route Frequency Ordered Stop   03/12/15 0945  metroNIDAZOLE (FLAGYL) IVPB 500 mg     500 mg 100 mL/hr over 60 Minutes Intravenous  Once 03/12/15 0939 03/12/15 0945   03/11/15 1600  cefTRIAXone (ROCEPHIN) 2 g in dextrose 5 % 50 mL IVPB - Premix     2 g 100 mL/hr over 30 Minutes Intravenous Every 24 hours 03/11/15 0941     03/07/15 1800  vancomycin (VANCOCIN) IVPB 1000 mg/200 mL premix  Status:  Discontinued     1,000 mg 200 mL/hr over 60 Minutes Intravenous Every 12 hours 03/07/15 0551 03/07/15 1524   03/07/15 0600  piperacillin-tazobactam (ZOSYN) IVPB 3.375 g  Status:  Discontinued     3.375 g 12.5 mL/hr over 240 Minutes Intravenous 3 times per day 03/07/15 0449  03/11/15 0941   03/07/15 0530  vancomycin (VANCOCIN) 2,500 mg in sodium chloride 0.9 % 500 mL IVPB     2,500 mg 250 mL/hr over 120 Minutes Intravenous  Once 03/07/15 0456 03/07/15 0749   03/06/15 2315  piperacillin-tazobactam (ZOSYN) IVPB 3.375 g  Status:  Discontinued     3.375 g 100 mL/hr over 30 Minutes Intravenous 3 times per day 03/06/15 2309 03/07/15 0449   03/06/15 2100  imipenem-cilastatin (PRIMAXIN) 500 mg in sodium chloride 0.9 % 100 mL IVPB     500 mg 200 mL/hr over 30 Minutes Intravenous  Once 03/06/15 2008 03/06/15 2130   03/06/15 1900  cefTRIAXone (ROCEPHIN) 1 g in dextrose 5 % 50 mL IVPB     1 g 100 mL/hr over 30 Minutes Intravenous NOW 03/06/15 1858 03/06/15 1934      Assessment/Plan: s/p Procedure(s): LAPAROSCOPIC CHOLECYSTECTOMY CHOLANGIOGRAM WAS NOT PERFORMED (N/A) Advance diet. Start fulls today Pt desires PT to work on his knee Will recheck lft's tomorrow. If still up then should reconsult GI  LOS: 9 days    TOTH III,Gershom Brobeck S 03/15/2015

## 2015-03-15 NOTE — Progress Notes (Signed)
PROGRESS NOTE  Justin Cohen ACZ:[id number] DOB: 1946-04-03 DOA: 03/06/2015 PCP: No primary care provider on file.  Summary: 69 year old man presented with right upper quadrant pain, fever, confusion and hypotension. Abdominal ultrasound revealed dilated common bile duct, CT abdomen and pelvis showed cholelithiasis with dilated common bile duct and intrahepatic ductal dilatation. Seen by GI, underwent ERCP 5/1 with stone removal and sphincterotomy. Bilirubin continued to rise, ERCP repeated 5/3 with recommendations to consider cholecystectomy. S/p cholecystectomy 5/5, gradually improving. Anticipate discharge next few days.  Assessment/Plan: 1. Sepsis secondary to Escherichia coli bacteremia secondary to cholangitis/choledocholithiasis and ischemic/necrotic cholecystitis. Status post cholecystectomy 5/5. Continues to improve. 2. Elevated LFTs presumed secondary to cholangitis, cholecystitis.  3. Acute encephalopathy. Mild. Secondary to acute illness, post surgery.  4. Diabetes mellitus type 2. Remained stable. Continue low-dose Lantus and sliding scale insulin. Hemoglobin A1c 7.3. Resume oral therapy on discharge. 5. S/p left total knee replacement. Physical therapy. 6. Obstructive sleep apnea. CPAP QHS. 7. Pulmonary nodules, incidental finding seen on CT. Outpatient follow-up with chest CT 6-12 months.   Continues to improve. Remains afebrile.  Plan per surgery including diet and disposition. Once cleared from a surgical standpoint, anticipate discharge home.  Out of bed to chair, ambulate twice a day.  Discussed with wife at bedside.  Code Status: full code DVT prophylaxis: heparin Family Communication:  Disposition Plan: home when improved  [name], MD  [facility]  Pager [phone] If 7PM-7AM, please contact night-coverage at [email or web address], password [facility] 03/15/2015, 8:55 AM  LOS: 9 days   Consultants:  GI  PT--HH  OT--no follow-up,  supervision  Procedures:  5/1 ERCP  5/3 repeat ERCP ENDOSCOPIC IMPRESSION:1. Normal ampullawith previous sphincterotomy site status post increased sphincterotomy as above 2. No pancreatic duct injections or wires advancement throughout the procedure 3. Few small stones removed4. Multiple negative balloon pull through's and negative occlusion cholangiogram 5. No obvious straining status post balloon dilation as above without better drainage status post stent as above  Laparoscopic cholecystectomy  Antibiotics:  Zosyn 4/29 >> 5/4  Ceftriaxone 5/4 >>  HPI/Subjective: No issues overnight.  Wife at bedside reports he still somewhat confused. She hopes he can go home today.  Overall patient feels well and has no specific complaints.  Objective: Filed Vitals:   03/14/15 0800 03/14/15 1537 03/14/15 2007 03/15/15 0605  BP: 188/59 157/64 169/59 168/70  Pulse: 56 61 57 78  Temp: 98.8 F (37.1 C) 97.6 F (36.4 C) 98.7 F (37.1 C) 98.3 F (36.8 C)  TempSrc: Axillary Oral Oral Oral  Resp: 24 22 22 20   Height:      Weight:      SpO2: 98% 94% 95% 97%    Intake/Output Summary (Last 24 hours) at 03/15/15 0855 Last data filed at 03/15/15 0741  Gross per 24 hour  Intake 809.17 ml  Output   5275 ml  Net -4465.83 ml     Filed Weights   03/09/15 0600 03/10/15 0400 03/14/15 0400  Weight: 126.4 kg (278 lb 10.6 oz) 130.8 kg (288 lb 5.8 oz) 129.9 kg (286 lb 6 oz)    Exam:     Afebrile 96 hours, VSS, no hypoxia General:  Appears comfortable, calm. Cardiovascular: Regular rate and rhythm, no murmur, rub or gallop.  Respiratory: Clear to auscultation bilaterally, no wheezes, rales or rhonchi. Normal respiratory effort. Musculoskeletal: grossly normal tone bilateral upper and lower extremities Psychiatric: Slightly confused. Speech fluent and clear.  New data reviewed:  UOP 5350  CBG stable  Pertinent data  since admission:  Blood culture Escherichia coli sensitive to  ceftriaxone.  Pending data:    Scheduled Meds: . antiseptic oral rinse  7 mL Mouth Rinse BID  . cefTRIAXone (ROCEPHIN)  IV  2 g Intravenous Q24H  . dorzolamide-timolol  1 drop Both Eyes BID  . fluticasone  1 spray Each Nare Daily  . heparin subcutaneous  5,000 Units Subcutaneous 3 times per day  . indomethacin  100 mg Rectal Once  . insulin aspart  0-9 Units Subcutaneous 6 times per day  . insulin glargine  4 Units Subcutaneous Daily  . venlafaxine XR  75 mg Oral Daily   Continuous Infusions:    Principal Problem:   Sepsis due to Escherichia coli Active Problems:   Cholangitis   HTN (hypertension)   DM w/o complication type II   Encephalopathy due to infection   Sleep apnea   Choledocholithiasis   Cholecystitis, acute   Time spent 20 minutes

## 2015-03-16 LAB — COMPREHENSIVE METABOLIC PANEL
ALT: 103 U/L — AB (ref 17–63)
ANION GAP: 9 (ref 5–15)
AST: 84 U/L — ABNORMAL HIGH (ref 15–41)
Albumin: 2.2 g/dL — ABNORMAL LOW (ref 3.5–5.0)
Alkaline Phosphatase: 248 U/L — ABNORMAL HIGH (ref 38–126)
BUN: 13 mg/dL (ref 6–20)
CO2: 26 mmol/L (ref 22–32)
Calcium: 8.2 mg/dL — ABNORMAL LOW (ref 8.9–10.3)
Chloride: 104 mmol/L (ref 101–111)
Creatinine, Ser: 0.73 mg/dL (ref 0.61–1.24)
GFR calc non Af Amer: >60 mL/min (ref >60)
GLUCOSE: 150 mg/dL — AB (ref 70–99)
Potassium: 3.6 mmol/L (ref 3.5–5.1)
SODIUM: 139 mmol/L (ref 135–145)
TOTAL PROTEIN: 6.1 g/dL — AB (ref 6.5–8.1)
Total Bilirubin: 3.3 mg/dL — ABNORMAL HIGH (ref 0.3–1.2)

## 2015-03-16 LAB — GLUCOSE, CAPILLARY
GLUCOSE-CAPILLARY: 162 mg/dL — AB (ref 70–99)
GLUCOSE-CAPILLARY: 144 mg/dL — AB (ref 70–99)
GLUCOSE-CAPILLARY: 182 mg/dL — AB (ref 70–99)
Glucose-Capillary: 140 mg/dL — ABNORMAL HIGH (ref 70–99)
Glucose-Capillary: 145 mg/dL — ABNORMAL HIGH (ref 70–99)
Glucose-Capillary: 156 mg/dL — ABNORMAL HIGH (ref 70–99)
Glucose-Capillary: 173 mg/dL — ABNORMAL HIGH (ref 70–99)

## 2015-03-16 MED ORDER — GLUCERNA SHAKE PO LIQD
237.0000 mL | Freq: Two times a day (BID) | ORAL | Status: DC
  Filled 2015-03-16: qty 237

## 2015-03-16 MED ORDER — CEFUROXIME AXETIL 500 MG PO TABS
500.0000 mg | ORAL_TABLET | Freq: Two times a day (BID) | ORAL | Status: DC
  Administered 2015-03-16 – 2015-03-17 (×2): 500 mg via ORAL
  Filled 2015-03-16 (×4): qty 1

## 2015-03-16 MED ORDER — PRO-STAT SUGAR FREE PO LIQD
30.0000 mL | Freq: Two times a day (BID) | ORAL | Status: DC
  Administered 2015-03-16: 30 mL via ORAL
  Filled 2015-03-16 (×4): qty 30

## 2015-03-16 MED ORDER — LIP MEDEX EX OINT: TOPICAL_OINTMENT | CUTANEOUS | Status: DC | PRN

## 2015-03-16 MED ORDER — VITAMINS A & D EX OINT
TOPICAL_OINTMENT | CUTANEOUS | Status: AC
  Administered 2015-03-16: 5
  Filled 2015-03-16: qty 5

## 2015-03-16 MED ORDER — GLUCERNA SHAKE PO LIQD
237.0000 mL | Freq: Three times a day (TID) | ORAL | Status: DC
  Administered 2015-03-16 – 2015-03-17 (×3): 237 mL via ORAL
  Filled 2015-03-16 (×5): qty 237

## 2015-03-16 NOTE — Care Management Note (Signed)
Case Management Note  Patient Details  Name: JUANYA VILLAVICENCIO MRN: [id number] Date of Birth: 1946/06/08  Subjective/Objective:      69 yo male admitted with sepsis due to e coli, s/p lap chole with drain              Action/Plan:  Spoke with patient and spouse and patient was receiving [facility] PT from [facility] s/p TKR on 4/12. They are requesting resumption of HH PT upon discharge. Contacted [facility] rep, Kristen, and made her aware of need for [facility] PT. Await order.  Expected Discharge Date:   (unknown)               Expected Discharge Plan:  [place]  In-House Referral:     Discharge planning Services  CM Consult  Post Acute Care Choice:    Choice offered to:  Spouse, Patient  DME Arranged:    DME Agency:  [place]:    [place] Agency:     Status of Service:  In process, will continue to follow  Medicare Important Message Given:  Yes Date Medicare IM Given:  03/16/15 Medicare IM give by:  Leanne Chang Date Additional Medicare IM Given:    Additional Medicare Important Message give by:     If discussed at [place] of Stay Meetings, dates discussed:    Additional Comments:  Scot Dock, RN 03/16/2015, 3:07 PM

## 2015-03-16 NOTE — Progress Notes (Signed)
PT Cancellation Note  Patient Details Name: Justin Cohen MRN: [id number] DOB: 03-Dec-1945   Cancelled Treatment:    Reason Eval/Treat Not Completed: Attempted PT tx session-pt declined to participate. Pt states he is discharging home on today and he would prefer not to have therapy today.    Weston Anna, MPT Pager: [phone]

## 2015-03-16 NOTE — Progress Notes (Signed)
Nutrition Follow-up  DOCUMENTATION CODES:  Non-severe (moderate) malnutrition in context of acute illness/injury  INTERVENTION: - Will order Glucerna Shake BID, d/c Resource Breeze - Continue Prostat BID  NUTRITION DIAGNOSIS:  Inadequate oral intake related to acute illness as evidenced by meal completion < 25%, percent weight loss. -onoging  GOAL:  Patient will meet greater than or equal to 90% of their needs -unmet  MONITOR:  PO intake, Supplement acceptance, Diet advancement, Labs, Weight trends  ASSESSMENT: Pt had repeat ERCP 5/3 and lap chole 5/5. Diet orders were as follows: 5/1, 5/2: CLD 5/3-5/6: NPO 5/7: CLD 5/8 FLD ordered, continues  Pt sleeping at time of visit and all information obtained from wife. No intakes documented since admission. Per wife, pt has had a very poor appetite since surgery. She does not think he had anything for dinner last night and she states he only ate a few bites of sherbet for breakfast this AM. Talked with her about the importance of protein and limiting high-fat foods. Talked about supplements on d/c and she states she plans to buy Glucerna for him. She also requested diet education concerning which foods to avoid during recovery period; provided her Fat-Restriction Nutrition Therapy and Gallbladder Nutrition Therapy handouts from the Nutrition Care Manual.  Pt not meeting needs. Wife reports possible d/c today per information given to her. Labs and medication reviewed; CBGs: 73-167 mg/dL.  Height:  Ht Readings from Last 1 Encounters:  03/07/15 5\' 8"  (1.727 m)    Weight:  Wt Readings from Last 1 Encounters:  03/14/15 286 lb 6 oz (129.9 kg)    Ideal Body Weight:  70 kg  Wt Readings from Last 10 Encounters:  03/14/15 286 lb 6 oz (129.9 kg)    BMI:  Body mass index is 43.55 kg/(m^2).  Estimated Nutritional Needs:  Kcal:  2100 - 2300  Protein:  130 - 145  Fluid:  2.0 L  Skin:  Reviewed, no issues (surgical  incision)  Diet Order:  Diet full liquid Room service appropriate?: Yes; Fluid consistency:: Thin  EDUCATION NEEDS:  Education information and handouts provided concerning diet s/p lap chole   Intake/Output Summary (Last 24 hours) at 03/16/15 1146 Last data filed at 03/16/15 0800  Gross per 24 hour  Intake    250 ml  Output   1645 ml  Net  -1395 ml    Last BM:  5/3   Jarome Matin, RD, LDN Inpatient Clinical Dietitian Pager # [phone] After hours/weekend pager # [phone]

## 2015-03-16 NOTE — Progress Notes (Signed)
Patient refuses CPAP for tonight.  

## 2015-03-16 NOTE — Progress Notes (Signed)
Patient ID: Justin Cohen, male   DOB: [birth year], 69 y.o.   MRN: [id number] 4 Days Post-Op  Subjective: Pt not eating.  Maybe eats 2 bites of pudding throughout the day per wife.  He is drinking some water.  Pain is controlled  Objective: Vital signs in last 24 hours: Temp:  [97.7 F (36.5 C)-98.1 F (36.7 C)] 97.7 F (36.5 C) (05/09 0609) Pulse Rate:  [72-91] 91 (05/09 0609) Resp:  [16-20] 16 (05/09 0609) BP: (146-177)/(74-99) 146/78 mmHg (05/09 0609) SpO2:  [93 %-97 %] 93 % (05/09 0609) Last BM Date: 03/14/15  Intake/Output from previous day: 05/08 0701 - 05/09 0700 In: 37 [P.O.:610] Out: 2175 [Urine:2150; Drains:25] Intake/Output this shift: Total I/O In: -  Out: 570 [Urine:550; Drains:20]  PE: Abd: soft, appropriately tender, incisions c/d/i, drain with serosan output, +BS  Lab Results:   Recent Labs  03/14/15 0345  WBC 11.7*  HGB 10.1*  HCT 30.1*  PLT 347   BMET  Recent Labs  03/15/15 1010 03/16/15 0515  NA 137 139  K 3.1* 3.6  CL 101 104  CO2 25 26  GLUCOSE 165* 150*  BUN 15 13  CREATININE 0.77 0.73  CALCIUM 8.2* 8.2*   PT/INR No results for input(s): LABPROT, INR in the last 72 hours. CMP     Component Value Date/Time   NA 139 03/16/2015 0515   K 3.6 03/16/2015 0515   CL 104 03/16/2015 0515   CO2 26 03/16/2015 0515   GLUCOSE 150* 03/16/2015 0515   BUN 13 03/16/2015 0515   CREATININE 0.73 03/16/2015 0515   CALCIUM 8.2* 03/16/2015 0515   PROT 6.1* 03/16/2015 0515   ALBUMIN 2.2* 03/16/2015 0515   AST 84* 03/16/2015 0515   ALT 103* 03/16/2015 0515   ALKPHOS 248* 03/16/2015 0515   BILITOT 3.3* 03/16/2015 0515   GFRNONAA >60 03/16/2015 0515   GFRAA >60 03/16/2015 0515   Lipase     Component Value Date/Time   LIPASE 17* 03/12/2015 0510       Studies/Results: No results found.  Anti-infectives: Anti-infectives    Start     Dose/Rate Route Frequency Ordered Stop   03/16/15 1700  cefUROXime (CEFTIN) tablet 500 mg     500 mg  Oral 2 times daily with meals 03/16/15 1227     03/12/15 0945  metroNIDAZOLE (FLAGYL) IVPB 500 mg     500 mg 100 mL/hr over 60 Minutes Intravenous  Once 03/12/15 0939 03/12/15 0945   03/11/15 1600  cefTRIAXone (ROCEPHIN) 2 g in dextrose 5 % 50 mL IVPB - Premix  Status:  Discontinued     2 g 100 mL/hr over 30 Minutes Intravenous Every 24 hours 03/11/15 0941 03/16/15 1227   03/07/15 1800  vancomycin (VANCOCIN) IVPB 1000 mg/200 mL premix  Status:  Discontinued     1,000 mg 200 mL/hr over 60 Minutes Intravenous Every 12 hours 03/07/15 0551 03/07/15 1524   03/07/15 0600  piperacillin-tazobactam (ZOSYN) IVPB 3.375 g  Status:  Discontinued     3.375 g 12.5 mL/hr over 240 Minutes Intravenous 3 times per day 03/07/15 0449 03/11/15 0941   03/07/15 0530  vancomycin (VANCOCIN) 2,500 mg in sodium chloride 0.9 % 500 mL IVPB     2,500 mg 250 mL/hr over 120 Minutes Intravenous  Once 03/07/15 0456 03/07/15 0749   03/06/15 2315  piperacillin-tazobactam (ZOSYN) IVPB 3.375 g  Status:  Discontinued     3.375 g 100 mL/hr over 30 Minutes Intravenous 3 times per day 03/06/15 2309  03/07/15 0449   03/06/15 2100  imipenem-cilastatin (PRIMAXIN) 500 mg in sodium chloride 0.9 % 100 mL IVPB     500 mg 200 mL/hr over 30 Minutes Intravenous  Once 03/06/15 2008 03/06/15 2130   03/06/15 1900  cefTRIAXone (ROCEPHIN) 1 g in dextrose 5 % 50 mL IVPB     1 g 100 mL/hr over 30 Minutes Intravenous NOW 03/06/15 1858 03/06/15 1934       Assessment/Plan   POD 4, s/p lap chole for perforated gallbladder, cholangitis, choledocholithiasis -patient overall weak.  He is not taking in much nutrition whatsoever.   -glucerna has been added TID.  I have advanced him to a low fat diet.  I have encouraged the patient to try and eat more.  I am concerned about sending him home right now as he very likely will not be able to maintain his hydration and nutritional status eating only 2 bites of pudding a day.  If he can increase his intake  some, then he likely can be dc home soon. -check LFTs in am  LOS: 10 days    Viveka Wilmeth E 03/16/2015, 1:07 PM Pager: [phone]

## 2015-03-16 NOTE — Progress Notes (Signed)
OT Cancellation Note  Patient Details Name: ROCKEY GUARINO MRN: [id number] DOB: 06/25/1946   Cancelled Treatment:    Reason Eval/Treat Not Completed: Other (comment) Spoke to pt and wife and they state they are hoping for d/c today. Asked if they needed to practice any ADL tasks and pt declines any need to practice right now.  [place], [place] 03/16/2015, 11:49 AM

## 2015-03-16 NOTE — Progress Notes (Signed)
PROGRESS NOTE  Justin Cohen NTI:[id number] DOB: 06-10-1946 DOA: 03/06/2015 PCP: No primary care provider on file.  Summary: 69 year old man presented with right upper quadrant pain, fever, confusion and hypotension. Abdominal ultrasound revealed dilated common bile duct, CT abdomen and pelvis showed cholelithiasis with dilated common bile duct and intrahepatic ductal dilatation. Seen by GI, underwent ERCP 5/1 with stone removal and sphincterotomy. Bilirubin continued to rise, ERCP repeated 5/3 with recommendations to consider cholecystectomy. S/p cholecystectomy 5/5, gradually improving. Anticipate discharge next few days.  Assessment/Plan: 1. Sepsis secondary to Escherichia coli bacteremia secondary to cholangitis/choledocholithiasis and ischemic/necrotic cholecystitis. Status post cholecystectomy 5/5. Continues to improve daily. 2. Elevated LFTs presumed secondary to cholangitis, cholecystitis. As above. 3. Acute encephalopathy. Very mild, expect spontaneous resolution. 4. Diabetes mellitus type 2. Remain stable. Continue low-dose Lantus and sliding scale insulin. Hemoglobin A1c 7.3. Resume oral therapy on discharge. 5. S/p left total knee replacement.  6. Obstructive sleep apnea. CPAP QHS. 7. Pulmonary nodules, incidental finding seen on CT. Outpatient follow-up with chest CT 6-12 months.   Continues to improve. Asymptomatic.  LFTs above normal but stable. Discussed with Dr. Rozell Searing f/u as outpatient with repeat labs in 1 week. No further inpatient evaluation recommended.  Change to oral abx.  Home today if cleared by surgery  Code Status: full code DVT prophylaxis: heparin Family Communication:  Disposition Plan: home when improved  [name], MD  [facility]  Pager [phone] If 7PM-7AM, please contact night-coverage at [email or web address], password [facility] 03/16/2015, 12:19 PM  LOS: 10 days   Consultants:  GI  PT--HH  OT--no follow-up,  supervision  Procedures:  5/1 ERCP  5/3 repeat ERCP ENDOSCOPIC IMPRESSION:1. Normal ampullawith previous sphincterotomy site status post increased sphincterotomy as above 2. No pancreatic duct injections or wires advancement throughout the procedure 3. Few small stones removed4. Multiple negative balloon pull through's and negative occlusion cholangiogram 5. No obvious straining status post balloon dilation as above without better drainage status post stent as above  Laparoscopic cholecystectomy  Antibiotics:  Zosyn 4/29 >> 5/4  Ceftriaxone 5/4 >> 5/8  Ceftin 5/9 >> 5/17  HPI/Subjective: Overall continuing to improved.   Objective: Filed Vitals:   03/15/15 1519 03/15/15 2200 03/16/15 0116 03/16/15 0609  BP: 169/99 177/80 162/74 146/78  Pulse: 88 80 72 91  Temp: 98.1 F (36.7 C) 98 F (36.7 C)  97.7 F (36.5 C)  TempSrc: Oral Oral  Oral  Resp: 20 20 16 16   Height:      Weight:      SpO2: 96% 94% 97% 93%    Intake/Output Summary (Last 24 hours) at 03/16/15 1219 Last data filed at 03/16/15 0800  Gross per 24 hour  Intake    250 ml  Output   1645 ml  Net  -1395 ml     Filed Weights   03/09/15 0600 03/10/15 0400 03/14/15 0400  Weight: 126.4 kg (278 lb 10.6 oz) 130.8 kg (288 lb 5.8 oz) 129.9 kg (286 lb 6 oz)    Exam:     Afebrile since 5/3, VSS, no hypoxia General:  Appears calm and comfortable, appears better today Cardiovascular: RRR, no m/r/g. No LE edema. Respiratory: CTA bilaterally, no w/r/r. Normal respiratory effort. Abdomen: soft, ntnd Psychiatric: grossly normal mood and affect, speech fluent and appropriate  New data reviewed:  UOP 2150  CBG stable  BMP unremarkable. CMP stable with tbili 3.3 and stable LFTs  Pertinent data since admission:  Blood culture Escherichia coli sensitive to ceftriaxone.  Pending data:  Scheduled Meds: . antiseptic oral rinse  7 mL Mouth Rinse BID  . cefTRIAXone (ROCEPHIN)  IV  2 g Intravenous Q24H   . dorzolamide-timolol  1 drop Both Eyes BID  . feeding supplement (GLUCERNA SHAKE)  237 mL Oral TID BM  . feeding supplement (PRO-STAT SUGAR FREE 64)  30 mL Oral BID  . fluticasone  1 spray Each Nare Daily  . heparin subcutaneous  5,000 Units Subcutaneous 3 times per day  . indomethacin  100 mg Rectal Once  . insulin aspart  0-9 Units Subcutaneous 6 times per day  . insulin glargine  4 Units Subcutaneous Daily  . venlafaxine XR  75 mg Oral Daily   Continuous Infusions:    Principal Problem:   Sepsis due to Escherichia coli Active Problems:   Cholangitis   HTN (hypertension)   DM w/o complication type II   Encephalopathy due to infection   Sleep apnea   Choledocholithiasis   Cholecystitis, acute

## 2015-03-16 NOTE — Progress Notes (Signed)
Subjective: No abdominal pain. Feels weak and deconditioned.  Objective: Vital signs in last 24 hours: Temp:  [97.7 F (36.5 C)-98.1 F (36.7 C)] 97.7 F (36.5 C) (05/09 0609) Pulse Rate:  [72-91] 91 (05/09 0609) Resp:  [16-20] 16 (05/09 0609) BP: (146-177)/(74-99) 146/78 mmHg (05/09 0609) SpO2:  [93 %-97 %] 93 % (05/09 0609) Weight change:  Last BM Date: 03/14/15  PE: GEN:  Overweight, NAD ABD: Soft, protuberant, JP drain in place, non-tender  Lab Results: CBC    Component Value Date/Time   WBC 11.7* 03/14/2015 0345   RBC 3.55* 03/14/2015 0345   HGB 10.1* 03/14/2015 0345   HCT 30.1* 03/14/2015 0345   PLT 347 03/14/2015 0345   MCV 84.8 03/14/2015 0345   MCH 28.5 03/14/2015 0345   MCHC 33.6 03/14/2015 0345   RDW 14.6 03/14/2015 0345   LYMPHSABS 0.6* 03/07/2015 0514   MONOABS 0.8 03/07/2015 0514   EOSABS 0.0 03/07/2015 0514   BASOSABS 0.0 03/07/2015 0514   CMP     Component Value Date/Time   NA 139 03/16/2015 0515   K 3.6 03/16/2015 0515   CL 104 03/16/2015 0515   CO2 26 03/16/2015 0515   GLUCOSE 150* 03/16/2015 0515   BUN 13 03/16/2015 0515   CREATININE 0.73 03/16/2015 0515   CALCIUM 8.2* 03/16/2015 0515   PROT 6.1* 03/16/2015 0515   ALBUMIN 2.2* 03/16/2015 0515   AST 84* 03/16/2015 0515   ALT 103* 03/16/2015 0515   ALKPHOS 248* 03/16/2015 0515   BILITOT 3.3* 03/16/2015 0515   GFRNONAA >60 03/16/2015 0515   GFRAA >60 03/16/2015 0515   Assessment:  1.  Choledocholithiasis s/p ERCP x 2, last about one week ago. 2.  LFTs, overall downtrending, but slight bump up this weekend.  Plan:  1.  Patient has no abdominal pain, fevers, or any other symptoms worrisome for cholangitis.  I suspect his residual elevated LFTs are likely at least in part due to cholestasis from his prior sepsis.  I do not suspect he has clogged stent or persistent biliary obstruction. 2.  I do not anticipate any further GI work-up while patient remains hospitalized.  Will defer timing  of discharge to surgical and primary teams.  We would like patient to call our office [phone]) upon discharge to arrange repeat LFTs in one week and office visit with Dr. Watt Climes in 2 weeks. 3.  Will sign-off; please call with questions; thank you for the consult.   Justin Cohen, Justin Cohen 03/16/2015, 12:33 PM   Pager [phone] If no answer or after 5 PM call [phone]

## 2015-03-17 LAB — COMPREHENSIVE METABOLIC PANEL
ALT: 106 U/L — ABNORMAL HIGH (ref 17–63)
AST: 84 U/L — ABNORMAL HIGH (ref 15–41)
Albumin: 2.1 g/dL — ABNORMAL LOW (ref 3.5–5.0)
Alkaline Phosphatase: 222 U/L — ABNORMAL HIGH (ref 38–126)
Anion gap: 8 (ref 5–15)
BUN: 14 mg/dL (ref 6–20)
CO2: 24 mmol/L (ref 22–32)
Calcium: 8.2 mg/dL — ABNORMAL LOW (ref 8.9–10.3)
Chloride: 105 mmol/L (ref 101–111)
Creatinine, Ser: 0.83 mg/dL (ref 0.61–1.24)
GFR calc Af Amer: >60 mL/min (ref >60)
GFR calc non Af Amer: >60 mL/min (ref >60)
Glucose, Bld: 162 mg/dL — ABNORMAL HIGH (ref 70–99)
Potassium: 3.3 mmol/L — ABNORMAL LOW (ref 3.5–5.1)
Sodium: 137 mmol/L (ref 135–145)
Total Bilirubin: 2.2 mg/dL — ABNORMAL HIGH (ref 0.3–1.2)
Total Protein: 5.7 g/dL — ABNORMAL LOW (ref 6.5–8.1)

## 2015-03-17 LAB — GLUCOSE, CAPILLARY
GLUCOSE-CAPILLARY: 255 mg/dL — AB (ref 70–99)
Glucose-Capillary: 153 mg/dL — ABNORMAL HIGH (ref 70–99)
Glucose-Capillary: 155 mg/dL — ABNORMAL HIGH (ref 70–99)

## 2015-03-17 MED ORDER — HYDROCODONE-ACETAMINOPHEN 5-325 MG PO TABS: 1.0000 | ORAL_TABLET | ORAL | Status: DC | PRN

## 2015-03-17 MED ORDER — CEFUROXIME AXETIL 500 MG PO TABS: 500.0000 mg | ORAL_TABLET | Freq: Two times a day (BID) | ORAL | Status: DC

## 2015-03-17 NOTE — Discharge Summary (Signed)
Physician Discharge Summary  Justin Cohen YOV:[id number] DOB: Jun 01, 1946 DOA: 03/06/2015  PCP: Justin Kroner, MD  Admit date: 03/06/2015 Discharge date: 03/17/2015  Recommendations for Outpatient Follow-up:  1. Elevated LFTs, resolution of acute cholangitis/cholecystitis 2. Pulmonary nodules, incidental finding seen on CT. Outpatient follow-up with chest CT 6-12 months.   Follow-up Information    Follow up with Justin Kroner, MD.   Specialty:  Family Medicine   Why:  As needed   Contact information:   [place] [address] [phone]       Follow up with Justin Ivan, MD On 03/26/2015.   Specialty:  General Surgery   Why:  arrive by 10:10AM 10:40AM post op check and drain check   Contact information:   [address] [place] [address] [phone]       Follow up with [facility], MD. Schedule an appointment as soon as possible for a visit in 2 weeks.   Specialty:  Gastroenterology   Why:  see Justin Cohen in 2 weeks, also please call office to arrange for blood work in 1 week   Contact information:   [address] [place] [address] [phone]      Discharge Diagnoses:  1. Sepsis secondary to Cohen coli bacteremia 2. Acute cholangitis 3. Acute choledocholithiasis 4. Acute cholecystitis 5. Elevated LFTs 6. Acute encephalopathy 7. DM type 2 8. OSA 9. Pulmonary nodules  Discharge Condition: improved Disposition: home  Diet recommendation: diabetic diet  Filed Weights   03/09/15 0600 03/10/15 0400 03/14/15 0400  Weight: 126.4 kg (278 lb 10.6 oz) 130.8 kg (288 lb 5.8 oz) 129.9 kg (286 lb 6 oz)    History of present illness:  69 year old man presented with right upper quadrant pain, fever, confusion and hypotension. Abdominal ultrasound revealed dilated common bile duct, CT abdomen and pelvis showed cholelithiasis with dilated common bile duct and intrahepatic ductal dilatation.  Hospital  Course:  [name] was admitted, placed on abx and seen by GI, underwent ERCP 5/1 with stone removal and sphincterotomy. Bilirubin continued to rise, ERCP repeated 5/3 with recommendations to consider cholecystectomy. S/p cholecystectomy 5/5, gradually improving and now stable for discharge. See individual issues below.   Sepsis secondary to Escherichia coli bacteremia secondary to cholangitis/choledocholithiasis and ischemic/necrotic cholecystitis. Status post cholecystectomy 5/5. Continues to improve daily.  Elevated LFTs presumed secondary to cholangitis, cholecystitis. As above.  Acute encephalopathy. Appears resolved.  Diabetes mellitus type 2. Remains stable. Hemoglobin A1c 7.3. Resume oral therapy on discharge.  S/p left total knee replacement.   Obstructive sleep apnea. CPAP QHS.  Pulmonary nodules, incidental finding seen on CT. Outpatient follow-up with chest CT 6-12 months.   Discussed with surgery--clear for discharge. They will arrange drain care and follow-up.  Consultants:  GI  PT--HH  OT--no follow-up, supervision  Procedures:  5/1 ERCP  5/3 repeat ERCP ENDOSCOPIC IMPRESSION:1. Normal ampullawith previous sphincterotomy site status post increased sphincterotomy as above 2. No pancreatic duct injections or wires advancement throughout the procedure 3. Few small stones removed4. Multiple negative balloon pull through's and negative occlusion cholangiogram 5. No obvious straining status post balloon dilation as above without better drainage status post stent as above  Laparoscopic cholecystectomy  Antibiotics:  Zosyn 4/29 >> 5/4  Ceftriaxone 5/4 >> 5/8  Ceftin 5/9 >> 5/17  Discharge Instructions   Current Discharge Medication List    START taking these medications   Details  cefUROXime (CEFTIN) 500 MG tablet Take  1 tablet (500 mg total) by mouth 2 (two) times daily with a meal. Qty: 14 tablet, Refills: 0      CONTINUE these medications  which have NOT CHANGED   Details  amLODipine-benazepril (LOTREL) 5-10 MG per capsule Take 1 capsule by mouth 2 (two) times daily.    aspirin EC 325 MG tablet Take 325 mg by mouth 2 (two) times daily.    cholecalciferol (VITAMIN D) 1000 UNITS tablet Take 1,000-2,000 Units by mouth daily. 2000 mg in the am and 1000 mg in the pm    dorzolamide-timolol (COSOPT) 22.3-6.8 MG/ML ophthalmic solution Place 1 drop into both eyes 2 (two) times daily.     fluticasone (FLONASE) 50 MCG/ACT nasal spray Place 1 spray into both nostrils every morning.    glipiZIDE (GLUCOTROL) 5 MG tablet Take 5 mg by mouth every morning.    HYDROcodone-acetaminophen (NORCO) 7.5-325 MG per tablet Take 1-2 tablets by mouth every 4 (four) hours as needed. For moderate pain    loratadine (CLARITIN) 10 MG tablet Take 10 mg by mouth every morning.    metFORMIN (GLUCOPHAGE) 1000 MG tablet Take 1,000 mg by mouth 2 (two) times daily.    Multiple Vitamin (MULTIVITAMIN WITH MINERALS) TABS tablet Take 1 tablet by mouth every morning.    Omega-3 Fatty Acids (FISH OIL) 1000 MG CAPS Take 1,000 mg by mouth every morning.    pioglitazone (ACTOS) 45 MG tablet Take 45 mg by mouth every morning.    PREVACID 15 MG capsule Take 15 mg by mouth every morning.    rosuvastatin (CRESTOR) 5 MG tablet Take 5 mg by mouth at bedtime.    tiZANidine (ZANAFLEX) 4 MG tablet Take 4 mg by mouth every 6 (six) hours as needed. Muscle spasms    venlafaxine XR (EFFEXOR-XR) 75 MG 24 hr capsule Take 75 mg by mouth every morning.       No Known Allergies  The results of significant diagnostics from this hospitalization (including imaging, microbiology, ancillary and laboratory) are listed below for reference.    Significant Diagnostic Studies: X-ray Chest Pa Or Ap  03/10/2015   CLINICAL DATA:  Difficulty breathing after ERCP. History of melanoma and prostate carcinoma  EXAM: CHEST  1 VIEW  COMPARISON:  March 06, 2015  FINDINGS: There is atelectatic  change in both lower lobes. There is no edema or consolidation. Heart is mildly enlarged with pulmonary vascularity within normal limits. No adenopathy. There is degenerative change in the thoracic spine. There are no blastic or lytic bone lesions apparent.  IMPRESSION: Atelectatic change in both lower lobes. Elsewhere lungs clear. Heart prominent but stable.   Electronically Signed   By: Lowella Grip III M.D.   On: 03/10/2015 17:55   Dg Abd 1 View  03/11/2015   CLINICAL DATA:  Abdominal pain.  EXAM: ABDOMEN - 1 VIEW  COMPARISON:  CT 03/06/2015.  FINDINGS: Biliary catheter not right. Surgical clips of the right. Minimally distended air-filled loops of small bowel. Colonic gas pattern unremarkable. Oral contrast in the colon . Mild adynamic ileus cannot be completely excluded. Degenerative changes lumbar spine .  IMPRESSION: 1. Biliary drainage catheter noted over the right upper quadrant. 2. Air-filled loops of small and large bowel noted. Minimal distention. Oral contrast in the colon. Mild adynamic ileus cannot be excluded P   Electronically Signed   By: Bradley   On: 03/11/2015 09:46   Ct Head Wo Contrast  03/06/2015   CLINICAL DATA:  Altered mental status started today.  Fever.  EXAM: CT HEAD WITHOUT CONTRAST  TECHNIQUE: Contiguous axial images were obtained from the base of the skull through the vertex without intravenous contrast.  COMPARISON:  None.  FINDINGS: There is mild central and cortical atrophy. Periventricular white matter change is consistent with small vessel disease. There is no intra or extra-axial fluid collection or mass lesion. The basilar cisterns and ventricles have a normal appearance. There is no CT evidence for acute infarction or hemorrhage. Bone windows are unremarkable.  IMPRESSION: 1. Atrophy and small vessel disease. 2.  No evidence for acute intracranial  abnormality.   Electronically Signed   By: Nolon Nations M.D.   On: 03/06/2015 21:52   US Abdomen  Complete  03/06/2015   CLINICAL DATA:  Fever right abdominal pain for 2 days concern for cholangitis  EXAM: ULTRASOUND ABDOMEN COMPLETE  COMPARISON:  None.  FINDINGS: Gallbladder: Nonmobile sludge with tiny echogenic foci possibly representing calculi. No wall thickening.  Common bile duct: Diameter: Measures up to about 1 cm. There is echogenic material within the duct.  Liver: Heterogeneous echotexture with no focal abnormalities. Probable fatty infiltration.  IVC: Not seen well  Pancreas: Not seen well  Spleen: Size and appearance within normal limits.  Right Kidney: Length: 13 cm. Echogenicity within normal limits. No mass or hydronephrosis visualized.  Left Kidney: Length: 14 cm. Echogenicity within normal limits. No mass or hydronephrosis visualized.  Abdominal aorta: Obscured by bowel gas  Other findings: None.  IMPRESSION: Dilated common bile duct. Distal obstructing lesion not excluded. Echogenic material within the duct suggesting sludge.  Abundant gallbladder sludge.   Electronically Signed   By: Skipper Cliche M.D.   On: 03/06/2015 21:04   Ct Abdomen Pelvis W Contrast  03/07/2015   CLINICAL DATA:  Acute onset of generalized abdominal pain. Initial encounter.  EXAM: CT ABDOMEN AND PELVIS WITH CONTRAST  TECHNIQUE: Multidetector CT imaging of the abdomen and pelvis was performed using the standard protocol following bolus administration of intravenous contrast.  CONTRAST:  186mL OMNIPAQUE IOHEXOL 300 MG/ML  SOLN  COMPARISON:  Abdominal ultrasound performed earlier today at 8:25 p.m.  FINDINGS: Multiple scattered small nodules are noted at the left lower lobe, measuring up to 6 mm in size. Given multiplicity of nodules, these are thought to be postinfectious in nature.  A few stones are seen dependently within the gallbladder. The gallbladder is mildly distended. The common bile duct is dilated to 1.0 cm in diameter, with intrahepatic biliary ductal dilatation, and suggestion of multiple small stones  within the distal common bile duct. The liver and spleen are otherwise unremarkable. The pancreas and adrenal glands are unremarkable.  Scattered small right renal cysts are noted. These measure up to 1.4 cm in size. Nonspecific perinephric stranding and fluid are noted bilaterally. The left kidney is otherwise unremarkable. No renal or ureteral stones are identified. There is no evidence of hydronephrosis.  No free fluid is identified. The small bowel is unremarkable in appearance. The stomach is within normal limits. No acute vascular abnormalities are seen. Scattered calcification is seen along the abdominal aorta and its branches.  The appendix is normal in caliber, without evidence of appendicitis. The colon is unremarkable in appearance.  The bladder is decompressed and not well assessed. The patient is status post prostatectomy. Small bilateral inguinal hernias are seen, containing only fat. No inguinal lymphadenopathy is seen.  No acute osseous abnormalities are identified. Multilevel vacuum phenomenon is noted along the lumbar spine. Underlying facet disease is noted.  IMPRESSION:  1. Cholelithiasis, with mild distention of the gallbladder, dilatation of the common bile duct to 1.0 cm in diameter, and intrahepatic biliary duct dilatation. Suggestion of multiple small stones within the distal common bile duct. Findings raise concern for distal obstruction. ERCP or MRCP could be performed for further evaluation. 2. Scattered small right renal cyst noted. 3. Scattered calcification along the abdominal aorta and its branches. 4. Small bilateral inguinal hernias, containing only fat. 5. Minimal degenerative change along the lumbar spine. 6. Multiple scattered small nodules at the left lower lung lobe, measuring up to 6 mm. Given multiplicity of nodules, these are most likely postinfectious in nature. However, if the patient is at high risk for bronchogenic carcinoma, follow-up chest CT at 6-12 months is  recommended. If the patient is at low risk for bronchogenic carcinoma, follow-up chest CT at 12 months is recommended. This recommendation follows the consensus statement: Guidelines for Management of Small Pulmonary Nodules Detected on CT Scans: A Statement from the [employer] [place] as published in Radiology 2005;237:395-400.   Electronically Signed   By: Garald Balding M.D.   On: 03/07/2015 00:43   Dg Chest Port 1 View  03/06/2015   CLINICAL DATA:  Two day history of fever  EXAM: PORTABLE CHEST - 1 VIEW  COMPARISON:  None.  FINDINGS: There is patchy infiltrate in the left lower lobe. Lungs elsewhere clear. Heart is upper normal in size with pulmonary vascularity within normal limits. No adenopathy. No bone lesions.  IMPRESSION: Patchy infiltrate left lower lobe.   Electronically Signed   By: Lowella Grip III M.D.   On: 03/06/2015 20:08   Dg Ercp Biliary & Pancreatic Ducts  03/10/2015   CLINICAL DATA:  Jaundice.  EXAM: ERCP  TECHNIQUE: Multiple spot images obtained with the fluoroscopic device and submitted for interpretation post-procedure.  FLUOROSCOPY TIME:  Fluoroscopy Time:  6 minutes and 18 seconds  Number of Acquired Images:  6  COMPARISON:  03/08/2015  FINDINGS: Filling defects in the common bile duct are compatible with stones. Opacification of the extrahepatic biliary system and the central intrahepatic bile ducts. Evidence of a balloon pull-through for stone extraction. In addition, evidence for balloon dilatation of the distal common bile duct. Placement of a non-metallic biliary stent.  IMPRESSION: Stone extraction and stent placement.  These images were submitted for radiologic interpretation only. Please see the procedural report for the amount of contrast and the fluoroscopy time utilized.   Electronically Signed   By: Markus Daft M.D.   On: 03/10/2015 18:15   Dg Ercp With Sphincterotomy  03/08/2015   CLINICAL DATA:  Cholangitis, biliary dilatation, and choledocholithiasis.  EXAM: ERCP   TECHNIQUE: Multiple spot images obtained with the fluoroscopic device and submitted for interpretation post-procedure.  COMPARISON:  None.  FINDINGS: Contrast injection into the distal common bile duct shows mild diffuse biliary ductal dilatation, as well as several irregular filling defects within the distal common bile duct, consistent with choledocholithiasis.  Subsequent images show passage of a balloon extraction catheter. Final images show no definite residual filling defects within the common bile duct.  IMPRESSION: Mild biliary ductal dilatation and distal common bile duct calculi. No definite residual biliary calculi seen following passage of balloon extraction catheter.  These images were submitted for radiologic interpretation only. Please see the procedural report for the amount of contrast and the fluoroscopy time utilized.   Electronically Signed   By: Earle Gell M.D.   On: 03/08/2015 13:58    Labs: Basic Metabolic Panel:  Recent Labs Lab 03/13/15 0330 03/14/15 0345 03/15/15 1010 03/16/15 0515 03/17/15 0535  NA 140 142 137 139 137  K 4.1 3.4* 3.1* 3.6 3.3*  CL 109 108 101 104 105  CO2 25 27 25 26 24   GLUCOSE 216* 150* 165* 150* 162*  BUN 22* 21* 15 13 14   CREATININE 0.82 0.77 0.77 0.73 0.83  CALCIUM 8.2* 8.2* 8.2* 8.2* 8.2*   Liver Function Tests:  Recent Labs Lab 03/12/15 0510 03/14/15 0345 03/15/15 1010 03/16/15 0515 03/17/15 0535  AST 66* 86* 83* 84* 84*  ALT 80* 87* 102* 103* 106*  ALKPHOS 305* 236* 272* 248* 222*  BILITOT 6.0* 2.8* 3.5* 3.3* 2.2*  PROT 5.4* 5.3* 6.1* 6.1* 5.7*  ALBUMIN 2.0* 2.0* 2.3* 2.2* 2.1*    Recent Labs Lab 03/11/15 0335 03/12/15 0510  LIPASE 15* 17*   CBC:  Recent Labs Lab 03/11/15 0335 03/12/15 0510 03/13/15 0330 03/14/15 0345  WBC 14.2* 13.2* 15.9* 11.7*  HGB 11.1* 10.2* 9.8* 10.1*  HCT 32.3* 29.1* 28.5* 30.1*  MCV 84.1 82.9 83.3 84.8  PLT 362 351 307 347    CBG:  Recent Labs Lab 03/16/15 2003 03/16/15 2340  03/17/15 0423 03/17/15 0732 03/17/15 1140  GLUCAP 173* 145* 153* 155* 255*    Principal Problem:   Sepsis due to Escherichia coli Active Problems:   Cholangitis   HTN (hypertension)   DM w/o complication type II   Encephalopathy due to infection   Sleep apnea   Choledocholithiasis   Cholecystitis, acute   Time coordinating discharge: 35 minutes  Signed:  [name], MD [facility] 03/17/2015, 2:17 PM

## 2015-03-17 NOTE — Plan of Care (Signed)
Problem: Discharge Progression Outcomes Goal: Tubes and drains discontinued if indicated Outcome: Not Applicable Date Met:  22/24/11 Patient to continue drain at home.  Wife instructed on care.  Home health to come to check on.

## 2015-03-17 NOTE — Progress Notes (Signed)
Patient ID: Justin Cohen, male   DOB: 06/23/1946, 69 y.o.   MRN: [id number]     [facility]      [address] [place], [place] [address]    Phone: [phone] FAX: [phone]     Subjective: Appetite is much improved.  No n/v.  Afebrile.   Objective:  Vital signs:  Filed Vitals:   03/16/15 0609 03/16/15 1358 03/16/15 2058 03/17/15 0426  BP: 146/78 180/72 154/82 175/75  Pulse: 91 84 84 79  Temp: 97.7 F (36.5 C) 98.5 F (36.9 C) 98.4 F (36.9 C) 98.5 F (36.9 C)  TempSrc: Oral Oral Oral Oral  Resp: '16 18 16 16  ' Height:      Weight:      SpO2: 93% 96% 93% 94%    Last BM Date: 03/14/15  Intake/Output   Yesterday:  05/09 0701 - 05/10 0700 In: 240 [P.O.:240] Out: 1640 [Urine:1600; Drains:40] This shift:  Total I/O In: 120 [P.O.:120] Out: -    Physical Exam: General: Pt awake/alert/oriented x4 in no acute distress  Abdomen: Soft.  Nondistended.   Mildly tender at incisions only. RUQ with serous, brown tinged output.  Around the drain is green output.  No evidence of peritonitis.  No incarcerated hernias.   Problem List:   Principal Problem:   Sepsis due to Escherichia coli Active Problems:   Cholangitis   HTN (hypertension)   DM w/o complication type II   Encephalopathy due to infection   Sleep apnea   Choledocholithiasis   Cholecystitis, acute    Results:   Labs: Results for orders placed or performed during the hospital encounter of 03/06/15 (from the past 48 hour(s))  Comprehensive metabolic panel     Status: Abnormal   Collection Time: 03/15/15 10:10 AM  Result Value Ref Range   Sodium 137 135 - 145 mmol/L   Potassium 3.1 (L) 3.5 - 5.1 mmol/L   Chloride 101 101 - 111 mmol/L   CO2 25 22 - 32 mmol/L   Glucose, Bld 165 (H) 70 - 99 mg/dL   BUN 15 6 - 20 mg/dL   Creatinine, Ser 0.77 0.61 - 1.24 mg/dL   Calcium 8.2 (L) 8.9 - 10.3 mg/dL   Total Protein 6.1 (L) 6.5 - 8.1 g/dL   Albumin 2.3 (L)  3.5 - 5.0 g/dL   AST 83 (H) 15 - 41 U/L   ALT 102 (H) 17 - 63 U/L   Alkaline Phosphatase 272 (H) 38 - 126 U/L   Total Bilirubin 3.5 (H) 0.3 - 1.2 mg/dL   GFR calc non Af Amer >60 >60 mL/min   GFR calc Af Amer >60 >60 mL/min    Comment: (NOTE) The eGFR has been calculated using the CKD EPI equation. This calculation has not been validated in all clinical situations. eGFR's persistently <60 mL/min signify possible Chronic Kidney Disease.    Anion gap 11 5 - 15  Glucose, capillary     Status: Abnormal   Collection Time: 03/15/15 12:34 PM  Result Value Ref Range   Glucose-Capillary 167 (H) 70 - 99 mg/dL  Glucose, capillary     Status: Abnormal   Collection Time: 03/15/15  4:31 PM  Result Value Ref Range   Glucose-Capillary 157 (H) 70 - 99 mg/dL  Glucose, capillary     Status: Abnormal   Collection Time: 03/15/15  8:15 PM  Result Value Ref Range   Glucose-Capillary 147 (H) 70 - 99 mg/dL   Comment  1 Notify RN   Glucose, capillary     Status: Abnormal   Collection Time: 03/15/15 11:42 PM  Result Value Ref Range   Glucose-Capillary 140 (H) 70 - 99 mg/dL   Comment 1 Notify RN   Glucose, capillary     Status: Abnormal   Collection Time: 03/16/15  4:16 AM  Result Value Ref Range   Glucose-Capillary 162 (H) 70 - 99 mg/dL   Comment 1 Notify RN   Comprehensive metabolic panel     Status: Abnormal   Collection Time: 03/16/15  5:15 AM  Result Value Ref Range   Sodium 139 135 - 145 mmol/L   Potassium 3.6 3.5 - 5.1 mmol/L   Chloride 104 101 - 111 mmol/L   CO2 26 22 - 32 mmol/L   Glucose, Bld 150 (H) 70 - 99 mg/dL   BUN 13 6 - 20 mg/dL   Creatinine, Ser 0.73 0.61 - 1.24 mg/dL   Calcium 8.2 (L) 8.9 - 10.3 mg/dL   Total Protein 6.1 (L) 6.5 - 8.1 g/dL   Albumin 2.2 (L) 3.5 - 5.0 g/dL   AST 84 (H) 15 - 41 U/L   ALT 103 (H) 17 - 63 U/L   Alkaline Phosphatase 248 (H) 38 - 126 U/L   Total Bilirubin 3.3 (H) 0.3 - 1.2 mg/dL   GFR calc non Af Amer >60 >60 mL/min   GFR calc Af Amer >60 >60  mL/min    Comment: (NOTE) The eGFR has been calculated using the CKD EPI equation. This calculation has not been validated in all clinical situations. eGFR's persistently <60 mL/min signify possible Chronic Kidney Disease.    Anion gap 9 5 - 15  Glucose, capillary     Status: Abnormal   Collection Time: 03/16/15  7:26 AM  Result Value Ref Range   Glucose-Capillary 144 (H) 70 - 99 mg/dL   Comment 1 Notify RN   Glucose, capillary     Status: Abnormal   Collection Time: 03/16/15 11:49 AM  Result Value Ref Range   Glucose-Capillary 156 (H) 70 - 99 mg/dL  Glucose, capillary     Status: Abnormal   Collection Time: 03/16/15  3:47 PM  Result Value Ref Range   Glucose-Capillary 182 (H) 70 - 99 mg/dL  Glucose, capillary     Status: Abnormal   Collection Time: 03/16/15  8:03 PM  Result Value Ref Range   Glucose-Capillary 173 (H) 70 - 99 mg/dL  Glucose, capillary     Status: Abnormal   Collection Time: 03/16/15 11:40 PM  Result Value Ref Range   Glucose-Capillary 145 (H) 70 - 99 mg/dL  Glucose, capillary     Status: Abnormal   Collection Time: 03/17/15  4:23 AM  Result Value Ref Range   Glucose-Capillary 153 (H) 70 - 99 mg/dL  Comprehensive metabolic panel     Status: Abnormal   Collection Time: 03/17/15  5:35 AM  Result Value Ref Range   Sodium 137 135 - 145 mmol/L   Potassium 3.3 (L) 3.5 - 5.1 mmol/L   Chloride 105 101 - 111 mmol/L   CO2 24 22 - 32 mmol/L   Glucose, Bld 162 (H) 70 - 99 mg/dL   BUN 14 6 - 20 mg/dL   Creatinine, Ser 0.83 0.61 - 1.24 mg/dL   Calcium 8.2 (L) 8.9 - 10.3 mg/dL   Total Protein 5.7 (L) 6.5 - 8.1 g/dL   Albumin 2.1 (L) 3.5 - 5.0 g/dL   AST 84 (H) 15 - 41  U/L   ALT 106 (H) 17 - 63 U/L   Alkaline Phosphatase 222 (H) 38 - 126 U/L   Total Bilirubin 2.2 (H) 0.3 - 1.2 mg/dL   GFR calc non Af Amer >60 >60 mL/min   GFR calc Af Amer >60 >60 mL/min    Comment: (NOTE) The eGFR has been calculated using the CKD EPI equation. This calculation has not been  validated in all clinical situations. eGFR's persistently <60 mL/min signify possible Chronic Kidney Disease.    Anion gap 8 5 - 15  Glucose, capillary     Status: Abnormal   Collection Time: 03/17/15  7:32 AM  Result Value Ref Range   Glucose-Capillary 155 (H) 70 - 99 mg/dL    Imaging / Studies: No results found.  Medications / Allergies:  Scheduled Meds: . antiseptic oral rinse  7 mL Mouth Rinse BID  . cefUROXime  500 mg Oral BID WC  . dorzolamide-timolol  1 drop Both Eyes BID  . feeding supplement (GLUCERNA SHAKE)  237 mL Oral TID BM  . feeding supplement (PRO-STAT SUGAR FREE 64)  30 mL Oral BID  . fluticasone  1 spray Each Nare Daily  . heparin subcutaneous  5,000 Units Subcutaneous 3 times per day  . indomethacin  100 mg Rectal Once  . insulin aspart  0-9 Units Subcutaneous 6 times per day  . insulin glargine  4 Units Subcutaneous Daily  . venlafaxine XR  75 mg Oral Daily   Continuous Infusions:  PRN Meds:.hydrALAZINE, lip balm, morphine injection, ondansetron **OR** ondansetron (ZOFRAN) IV, promethazine  Antibiotics: Anti-infectives    Start     Dose/Rate Route Frequency Ordered Stop   03/17/15 0000  cefUROXime (CEFTIN) 500 MG tablet     500 mg Oral 2 times daily with meals 03/17/15 0835     03/16/15 1700  cefUROXime (CEFTIN) tablet 500 mg     500 mg Oral 2 times daily with meals 03/16/15 1227     03/12/15 0945  metroNIDAZOLE (FLAGYL) IVPB 500 mg     500 mg 100 mL/hr over 60 Minutes Intravenous  Once 03/12/15 0939 03/12/15 0945   03/11/15 1600  cefTRIAXone (ROCEPHIN) 2 g in dextrose 5 % 50 mL IVPB - Premix  Status:  Discontinued     2 g 100 mL/hr over 30 Minutes Intravenous Every 24 hours 03/11/15 0941 03/16/15 1227   03/07/15 1800  vancomycin (VANCOCIN) IVPB 1000 mg/200 mL premix  Status:  Discontinued     1,000 mg 200 mL/hr over 60 Minutes Intravenous Every 12 hours 03/07/15 0551 03/07/15 1524   03/07/15 0600  piperacillin-tazobactam (ZOSYN) IVPB 3.375 g   Status:  Discontinued     3.375 g 12.5 mL/hr over 240 Minutes Intravenous 3 times per day 03/07/15 0449 03/11/15 0941   03/07/15 0530  vancomycin (VANCOCIN) 2,500 mg in sodium chloride 0.9 % 500 mL IVPB     2,500 mg 250 mL/hr over 120 Minutes Intravenous  Once 03/07/15 0456 03/07/15 0749   03/06/15 2315  piperacillin-tazobactam (ZOSYN) IVPB 3.375 g  Status:  Discontinued     3.375 g 100 mL/hr over 30 Minutes Intravenous 3 times per day 03/06/15 2309 03/07/15 0449   03/06/15 2100  imipenem-cilastatin (PRIMAXIN) 500 mg in sodium chloride 0.9 % 100 mL IVPB     500 mg 200 mL/hr over 30 Minutes Intravenous  Once 03/06/15 2008 03/06/15 2130   03/06/15 1900  cefTRIAXone (ROCEPHIN) 1 g in dextrose 5 % 50 mL IVPB  1 g 100 mL/hr over 30 Minutes Intravenous NOW 03/06/15 1858 03/06/15 1934        Assessment/Plan Ischemic/necrotic acute cholecystitis Choledocholithiasis, cholangitis  POD#5 laparoscopic cholecystectomy---Dr. Rosendo Gros -PO intake is much improved. LFTs trending down.   -JP drain appears brown tinged, fibrillar hemostatic agent was used which can also give it this appearance, however, I cannot be certain that he does not have a CBD injury or a bile leak.  Will send drain output for T bilirubin to verify -SCD/hepairn -mobilize -morphine received on 5/9, add norco PRN. Dispo-pending above results  Erby Pian, [facility] Pager [phone](7A-4:30P)   03/17/2015 10:07 AM

## 2015-03-17 NOTE — Progress Notes (Signed)
Patient given discharge instructions, and verbalized an understanding of all discharge instructions.  Patient agrees with discharge plan, and is being discharged in stable medical condition.  Patient given transportation via wheelchair.  Wayne Brunker RN 

## 2015-03-17 NOTE — Progress Notes (Signed)
PROGRESS NOTE  Justin Cohen QQV:[id number] DOB: [birth year] DOA: 03/06/2015 PCP: Gara Kroner, MD  Dr. Alvan Dame  Summary: 69 year old man presented with right upper quadrant pain, fever, confusion and hypotension. Abdominal ultrasound revealed dilated common bile duct, CT abdomen and pelvis showed cholelithiasis with dilated common bile duct and intrahepatic ductal dilatation. Seen by GI, underwent ERCP 5/1 with stone removal and sphincterotomy. Bilirubin continued to rise, ERCP repeated 5/3 with recommendations to consider cholecystectomy. S/p cholecystectomy 5/5, gradually improving. Anticipate discharge next few days.  Assessment/Plan: 1. Sepsis secondary to Escherichia coli bacteremia secondary to cholangitis/choledocholithiasis and ischemic/necrotic cholecystitis. Status post cholecystectomy 5/5. Continues to improve daily. 2. Elevated LFTs presumed secondary to cholangitis, cholecystitis. As above. 3. Acute encephalopathy. Appears resolved. 4. Diabetes mellitus type 2. Remains stable. Hemoglobin A1c 7.3. Resume oral therapy on discharge. 5. S/p left total knee replacement.  6. Obstructive sleep apnea. CPAP QHS. 7. Pulmonary nodules, incidental finding seen on CT. Outpatient follow-up with chest CT 6-12 months.   Doing well, asymptomatic  Tbili improved, LFTs otherwise stable.  Discussed with surgery--clear for discharge. They will arrange drain care and follow-up.  Discussed in detail with wife at bedside  Code Status: full code DVT prophylaxis: heparin Family Communication:  Disposition Plan: home when improved  [name], MD  [facility]  Pager [phone] If 7PM-7AM, please contact night-coverage at [email or web address], password [facility] 03/17/2015, 8:27 AM  LOS: 11 days   Consultants:  GI  PT--HH  OT--no follow-up, supervision  Procedures:  5/1 ERCP  5/3 repeat ERCP ENDOSCOPIC IMPRESSION:1. Normal ampullawith previous sphincterotomy site status post  increased sphincterotomy as above 2. No pancreatic duct injections or wires advancement throughout the procedure 3. Few small stones removed4. Multiple negative balloon pull through's and negative occlusion cholangiogram 5. No obvious straining status post balloon dilation as above without better drainage status post stent as above  Laparoscopic cholecystectomy  Antibiotics:  Zosyn 4/29 >> 5/4  Ceftriaxone 5/4 >> 5/8  Ceftin 5/9 >> 5/17  HPI/Subjective: Feeling well. Eating better. Wants to go home. No complaints.  Objective: Filed Vitals:   03/16/15 0609 03/16/15 1358 03/16/15 2058 03/17/15 0426  BP: 146/78 180/72 154/82 175/75  Pulse: 91 84 84 79  Temp: 97.7 F (36.5 C) 98.5 F (36.9 C) 98.4 F (36.9 C) 98.5 F (36.9 C)  TempSrc: Oral Oral Oral Oral  Resp: 16 18 16 16   Height:      Weight:      SpO2: 93% 96% 93% 94%    Intake/Output Summary (Last 24 hours) at 03/17/15 0827 Last data filed at 03/17/15 8416  Gross per 24 hour  Intake    360 ml  Output   1620 ml  Net  -1260 ml     Filed Weights   03/09/15 0600 03/10/15 0400 03/14/15 0400  Weight: 126.4 kg (278 lb 10.6 oz) 130.8 kg (288 lb 5.8 oz) 129.9 kg (286 lb 6 oz)    Exam:     Afebrile since 5/3, VSS, no hypoxia General:  Appears comfortable, calm. Cardiovascular: Regular rate and rhythm, no murmur, rub or gallop. No lower extremity edema. Respiratory: Clear to auscultation bilaterally, no wheezes, rales or rhonchi. Normal respiratory effort. Psychiatric: grossly normal mood and affect, speech fluent and appropriate  New data reviewed:  UOP 1600  CBG stable  BMP unremarkable. CMP stable with tbili 3.3 and stable LFTs  Pertinent data since admission:  Blood culture Escherichia coli sensitive to ceftriaxone.  Pending data:    Scheduled Meds: . antiseptic oral  rinse  7 mL Mouth Rinse BID  . cefUROXime  500 mg Oral BID WC  . dorzolamide-timolol  1 drop Both Eyes BID  . feeding supplement  (GLUCERNA SHAKE)  237 mL Oral TID BM  . feeding supplement (PRO-STAT SUGAR FREE 64)  30 mL Oral BID  . fluticasone  1 spray Each Nare Daily  . heparin subcutaneous  5,000 Units Subcutaneous 3 times per day  . indomethacin  100 mg Rectal Once  . insulin aspart  0-9 Units Subcutaneous 6 times per day  . insulin glargine  4 Units Subcutaneous Daily  . venlafaxine XR  75 mg Oral Daily   Continuous Infusions:    Principal Problem:   Sepsis due to Escherichia coli Active Problems:   Cholangitis   HTN (hypertension)   DM w/o complication type II   Encephalopathy due to infection   Sleep apnea   Choledocholithiasis   Cholecystitis, acute

## 2015-03-17 NOTE — Discharge Instructions (Signed)

## 2015-03-18 DIAGNOSIS — E119 Type 2 diabetes mellitus without complications: Secondary | ICD-10-CM | POA: Diagnosis not present

## 2015-03-18 DIAGNOSIS — I10 Essential (primary) hypertension: Secondary | ICD-10-CM | POA: Diagnosis not present

## 2015-03-18 DIAGNOSIS — Z720 Tobacco use: Secondary | ICD-10-CM | POA: Diagnosis not present

## 2015-03-18 DIAGNOSIS — Z96652 Presence of left artificial knee joint: Secondary | ICD-10-CM | POA: Diagnosis not present

## 2015-03-18 DIAGNOSIS — Z471 Aftercare following joint replacement surgery: Secondary | ICD-10-CM | POA: Diagnosis not present

## 2015-03-18 DIAGNOSIS — M150 Primary generalized (osteo)arthritis: Secondary | ICD-10-CM | POA: Diagnosis not present

## 2015-03-20 DIAGNOSIS — I10 Essential (primary) hypertension: Secondary | ICD-10-CM | POA: Diagnosis not present

## 2015-03-20 DIAGNOSIS — Z471 Aftercare following joint replacement surgery: Secondary | ICD-10-CM | POA: Diagnosis not present

## 2015-03-20 DIAGNOSIS — Z96652 Presence of left artificial knee joint: Secondary | ICD-10-CM | POA: Diagnosis not present

## 2015-03-20 DIAGNOSIS — Z720 Tobacco use: Secondary | ICD-10-CM | POA: Diagnosis not present

## 2015-03-20 DIAGNOSIS — M150 Primary generalized (osteo)arthritis: Secondary | ICD-10-CM | POA: Diagnosis not present

## 2015-03-20 DIAGNOSIS — E119 Type 2 diabetes mellitus without complications: Secondary | ICD-10-CM | POA: Diagnosis not present

## 2015-03-21 DIAGNOSIS — Z720 Tobacco use: Secondary | ICD-10-CM | POA: Diagnosis not present

## 2015-03-21 DIAGNOSIS — Z471 Aftercare following joint replacement surgery: Secondary | ICD-10-CM | POA: Diagnosis not present

## 2015-03-21 DIAGNOSIS — Z96652 Presence of left artificial knee joint: Secondary | ICD-10-CM | POA: Diagnosis not present

## 2015-03-21 DIAGNOSIS — M150 Primary generalized (osteo)arthritis: Secondary | ICD-10-CM | POA: Diagnosis not present

## 2015-03-21 DIAGNOSIS — E119 Type 2 diabetes mellitus without complications: Secondary | ICD-10-CM | POA: Diagnosis not present

## 2015-03-21 DIAGNOSIS — I10 Essential (primary) hypertension: Secondary | ICD-10-CM | POA: Diagnosis not present

## 2015-03-23 DIAGNOSIS — E119 Type 2 diabetes mellitus without complications: Secondary | ICD-10-CM | POA: Diagnosis not present

## 2015-03-23 DIAGNOSIS — Z471 Aftercare following joint replacement surgery: Secondary | ICD-10-CM | POA: Diagnosis not present

## 2015-03-23 DIAGNOSIS — M150 Primary generalized (osteo)arthritis: Secondary | ICD-10-CM | POA: Diagnosis not present

## 2015-03-23 DIAGNOSIS — Z720 Tobacco use: Secondary | ICD-10-CM | POA: Diagnosis not present

## 2015-03-23 DIAGNOSIS — I10 Essential (primary) hypertension: Secondary | ICD-10-CM | POA: Diagnosis not present

## 2015-03-23 DIAGNOSIS — Z96652 Presence of left artificial knee joint: Secondary | ICD-10-CM | POA: Diagnosis not present

## 2015-03-24 DIAGNOSIS — Z96652 Presence of left artificial knee joint: Secondary | ICD-10-CM | POA: Diagnosis not present

## 2015-03-24 DIAGNOSIS — Z471 Aftercare following joint replacement surgery: Secondary | ICD-10-CM | POA: Diagnosis not present

## 2015-03-24 DIAGNOSIS — M150 Primary generalized (osteo)arthritis: Secondary | ICD-10-CM | POA: Diagnosis not present

## 2015-03-24 DIAGNOSIS — I10 Essential (primary) hypertension: Secondary | ICD-10-CM | POA: Diagnosis not present

## 2015-03-24 DIAGNOSIS — Z720 Tobacco use: Secondary | ICD-10-CM | POA: Diagnosis not present

## 2015-03-24 DIAGNOSIS — E119 Type 2 diabetes mellitus without complications: Secondary | ICD-10-CM | POA: Diagnosis not present

## 2015-03-24 LAB — TOTAL BILIRUBIN, BODY FLUID: Total bilirubin, fluid: 2.3 mg/dL

## 2015-03-25 DIAGNOSIS — M150 Primary generalized (osteo)arthritis: Secondary | ICD-10-CM | POA: Diagnosis not present

## 2015-03-25 DIAGNOSIS — I10 Essential (primary) hypertension: Secondary | ICD-10-CM | POA: Diagnosis not present

## 2015-03-25 DIAGNOSIS — Z96652 Presence of left artificial knee joint: Secondary | ICD-10-CM | POA: Diagnosis not present

## 2015-03-25 DIAGNOSIS — Z720 Tobacco use: Secondary | ICD-10-CM | POA: Diagnosis not present

## 2015-03-25 DIAGNOSIS — E119 Type 2 diabetes mellitus without complications: Secondary | ICD-10-CM | POA: Diagnosis not present

## 2015-03-25 DIAGNOSIS — Z471 Aftercare following joint replacement surgery: Secondary | ICD-10-CM | POA: Diagnosis not present

## 2015-03-30 DIAGNOSIS — M1712 Unilateral primary osteoarthritis, left knee: Secondary | ICD-10-CM | POA: Diagnosis not present

## 2015-03-30 DIAGNOSIS — R262 Difficulty in walking, not elsewhere classified: Secondary | ICD-10-CM | POA: Diagnosis not present

## 2015-03-30 DIAGNOSIS — M25562 Pain in left knee: Secondary | ICD-10-CM | POA: Diagnosis not present

## 2015-03-31 ENCOUNTER — Other Ambulatory Visit (HOSPITAL_COMMUNITY): Payer: Self-pay

## 2015-03-31 ENCOUNTER — Emergency Department (HOSPITAL_COMMUNITY): Payer: Commercial Managed Care - HMO

## 2015-03-31 ENCOUNTER — Encounter (HOSPITAL_COMMUNITY): Payer: Self-pay

## 2015-03-31 ENCOUNTER — Other Ambulatory Visit: Payer: Self-pay

## 2015-03-31 ENCOUNTER — Inpatient Hospital Stay (HOSPITAL_COMMUNITY)
Admission: EM | Admit: 2015-03-31 | Discharge: 2015-04-02 | DRG: 862 | Disposition: A | Discharge status: Home Health | Payer: Commercial Managed Care - HMO | Attending: Internal Medicine | Admitting: Internal Medicine

## 2015-03-31 DIAGNOSIS — Z801 Family history of malignant neoplasm of trachea, bronchus and lung: Secondary | ICD-10-CM

## 2015-03-31 DIAGNOSIS — Z87891 Personal history of nicotine dependence: Secondary | ICD-10-CM

## 2015-03-31 DIAGNOSIS — Z6841 Body Mass Index (BMI) 40.0 and over, adult: Secondary | ICD-10-CM

## 2015-03-31 DIAGNOSIS — K810 Acute cholecystitis: Secondary | ICD-10-CM | POA: Diagnosis not present

## 2015-03-31 DIAGNOSIS — Z79899 Other long term (current) drug therapy: Secondary | ICD-10-CM | POA: Diagnosis not present

## 2015-03-31 DIAGNOSIS — Y836 Removal of other organ (partial) (total) as the cause of abnormal reaction of the patient, or of later complication, without mention of misadventure at the time of the procedure: Secondary | ICD-10-CM | POA: Diagnosis present

## 2015-03-31 DIAGNOSIS — I10 Essential (primary) hypertension: Secondary | ICD-10-CM | POA: Diagnosis present

## 2015-03-31 DIAGNOSIS — E785 Hyperlipidemia, unspecified: Secondary | ICD-10-CM

## 2015-03-31 DIAGNOSIS — Z85820 Personal history of malignant melanoma of skin: Secondary | ICD-10-CM

## 2015-03-31 DIAGNOSIS — R932 Abnormal findings on diagnostic imaging of liver and biliary tract: Secondary | ICD-10-CM | POA: Diagnosis not present

## 2015-03-31 DIAGNOSIS — Z7982 Long term (current) use of aspirin: Secondary | ICD-10-CM

## 2015-03-31 DIAGNOSIS — T814XXA Infection following a procedure, initial encounter: Principal | ICD-10-CM | POA: Diagnosis present

## 2015-03-31 DIAGNOSIS — R197 Diarrhea, unspecified: Secondary | ICD-10-CM | POA: Diagnosis present

## 2015-03-31 DIAGNOSIS — E440 Moderate protein-calorie malnutrition: Secondary | ICD-10-CM | POA: Insufficient documentation

## 2015-03-31 DIAGNOSIS — T8149XA Infection following a procedure, other surgical site, initial encounter: Secondary | ICD-10-CM | POA: Diagnosis present

## 2015-03-31 DIAGNOSIS — Z79891 Long term (current) use of opiate analgesic: Secondary | ICD-10-CM

## 2015-03-31 DIAGNOSIS — K651 Peritoneal abscess: Secondary | ICD-10-CM

## 2015-03-31 DIAGNOSIS — Z96653 Presence of artificial knee joint, bilateral: Secondary | ICD-10-CM | POA: Diagnosis present

## 2015-03-31 DIAGNOSIS — IMO0001 Reserved for inherently not codable concepts without codable children: Secondary | ICD-10-CM

## 2015-03-31 DIAGNOSIS — R531 Weakness: Secondary | ICD-10-CM

## 2015-03-31 DIAGNOSIS — R1030 Lower abdominal pain, unspecified: Secondary | ICD-10-CM | POA: Diagnosis not present

## 2015-03-31 DIAGNOSIS — E119 Type 2 diabetes mellitus without complications: Secondary | ICD-10-CM | POA: Diagnosis not present

## 2015-03-31 DIAGNOSIS — K750 Abscess of liver: Secondary | ICD-10-CM | POA: Diagnosis present

## 2015-03-31 DIAGNOSIS — K6811 Postprocedural retroperitoneal abscess: Secondary | ICD-10-CM | POA: Diagnosis not present

## 2015-03-31 DIAGNOSIS — Z8249 Family history of ischemic heart disease and other diseases of the circulatory system: Secondary | ICD-10-CM | POA: Diagnosis not present

## 2015-03-31 DIAGNOSIS — Z8546 Personal history of malignant neoplasm of prostate: Secondary | ICD-10-CM

## 2015-03-31 DIAGNOSIS — G4730 Sleep apnea, unspecified: Secondary | ICD-10-CM | POA: Diagnosis present

## 2015-03-31 DIAGNOSIS — Z9889 Other specified postprocedural states: Secondary | ICD-10-CM

## 2015-03-31 DIAGNOSIS — Z9049 Acquired absence of other specified parts of digestive tract: Secondary | ICD-10-CM | POA: Diagnosis present

## 2015-03-31 DIAGNOSIS — R740 Nonspecific elevation of levels of transaminase and lactic acid dehydrogenase [LDH]: Secondary | ICD-10-CM | POA: Diagnosis not present

## 2015-03-31 DIAGNOSIS — E669 Obesity, unspecified: Secondary | ICD-10-CM | POA: Diagnosis present

## 2015-03-31 DIAGNOSIS — L0291 Cutaneous abscess, unspecified: Secondary | ICD-10-CM | POA: Insufficient documentation

## 2015-03-31 DIAGNOSIS — J9811 Atelectasis: Secondary | ICD-10-CM | POA: Diagnosis not present

## 2015-03-31 DIAGNOSIS — N281 Cyst of kidney, acquired: Secondary | ICD-10-CM | POA: Diagnosis not present

## 2015-03-31 LAB — LIPASE, BLOOD: LIPASE: 19 U/L — AB (ref 22–51)

## 2015-03-31 LAB — COMPREHENSIVE METABOLIC PANEL
ALBUMIN: 3.4 g/dL — AB (ref 3.5–5.0)
ALK PHOS: 187 U/L — AB (ref 38–126)
ALT: 46 U/L (ref 17–63)
AST: 47 U/L — AB (ref 15–41)
Anion gap: 13 (ref 5–15)
BUN: 14 mg/dL (ref 6–20)
CALCIUM: 9.5 mg/dL (ref 8.9–10.3)
CHLORIDE: 101 mmol/L (ref 101–111)
CO2: 22 mmol/L (ref 22–32)
Creatinine, Ser: 0.79 mg/dL (ref 0.61–1.24)
GFR calc Af Amer: >60 mL/min (ref >60)
GFR calc non Af Amer: >60 mL/min (ref >60)
Glucose, Bld: 154 mg/dL — ABNORMAL HIGH (ref 65–99)
Potassium: 4.2 mmol/L (ref 3.5–5.1)
Sodium: 136 mmol/L (ref 135–145)
Total Bilirubin: 1.6 mg/dL — ABNORMAL HIGH (ref 0.3–1.2)
Total Protein: 7.4 g/dL (ref 6.5–8.1)

## 2015-03-31 LAB — URINALYSIS, ROUTINE W REFLEX MICROSCOPIC
Bilirubin Urine: NEGATIVE
Glucose, UA: NEGATIVE mg/dL
Hgb urine dipstick: NEGATIVE
KETONES UR: NEGATIVE mg/dL
Leukocytes, UA: NEGATIVE
Nitrite: NEGATIVE
PH: 6.5 (ref 5.0–8.0)
PROTEIN: NEGATIVE mg/dL
SPECIFIC GRAVITY, URINE: 1.016 (ref 1.005–1.030)
UROBILINOGEN UA: 1 mg/dL (ref 0.0–1.0)

## 2015-03-31 LAB — CBC WITH DIFFERENTIAL/PLATELET
Basophils Absolute: 0 10*3/uL (ref 0.0–0.1)
Basophils Relative: 0 % (ref 0–1)
EOS ABS: 0.2 10*3/uL (ref 0.0–0.7)
EOS PCT: 3 % (ref 0–5)
HCT: 39.1 % (ref 39.0–52.0)
Hemoglobin: 12.5 g/dL — ABNORMAL LOW (ref 13.0–17.0)
LYMPHS ABS: 2.2 10*3/uL (ref 0.7–4.0)
Lymphocytes Relative: 27 % (ref 12–46)
MCH: 28 pg (ref 26.0–34.0)
MCHC: 32 g/dL (ref 30.0–36.0)
MCV: 87.5 fL (ref 78.0–100.0)
MONO ABS: 0.6 10*3/uL (ref 0.1–1.0)
MONOS PCT: 8 % (ref 3–12)
NEUTROS ABS: 4.9 10*3/uL (ref 1.7–7.7)
Neutrophils Relative %: 62 % (ref 43–77)
Platelets: 397 10*3/uL (ref 150–400)
RBC: 4.47 MIL/uL (ref 4.22–5.81)
RDW: 15.8 % — ABNORMAL HIGH (ref 11.5–15.5)
WBC: 8 10*3/uL (ref 4.0–10.5)

## 2015-03-31 LAB — PROTIME-INR
INR: 1.06 (ref 0.00–1.49)
Prothrombin Time: 14 seconds (ref 11.6–15.2)

## 2015-03-31 LAB — TROPONIN I: Troponin I: <0.03 ng/mL (ref <0.031)

## 2015-03-31 LAB — BRAIN NATRIURETIC PEPTIDE: B Natriuretic Peptide: 15.3 pg/mL (ref 0.0–100.0)

## 2015-03-31 LAB — I-STAT CG4 LACTIC ACID, ED: Lactic Acid, Venous: 1.3 mmol/L (ref 0.5–2.0)

## 2015-03-31 MED ORDER — METRONIDAZOLE IN NACL 5-0.79 MG/ML-% IV SOLN
500.0000 mg | Freq: Once | INTRAVENOUS | Status: AC
  Administered 2015-04-01: 500 mg via INTRAVENOUS
  Filled 2015-03-31: qty 100

## 2015-03-31 MED ORDER — SODIUM CHLORIDE 0.9 % IV SOLN
INTRAVENOUS | Status: DC
  Administered 2015-03-31: 20:00:00 via INTRAVENOUS

## 2015-03-31 MED ORDER — IOHEXOL 300 MG/ML  SOLN
100.0000 mL | Freq: Once | INTRAMUSCULAR | Status: AC | PRN
  Administered 2015-03-31: 100 mL via INTRAVENOUS

## 2015-03-31 MED ORDER — SODIUM CHLORIDE 0.9 % IV BOLUS (SEPSIS): 500.0000 mL | Freq: Once | INTRAVENOUS | Status: DC

## 2015-03-31 MED ORDER — PIPERACILLIN-TAZOBACTAM 3.375 G IVPB
3.3750 g | Freq: Once | INTRAVENOUS | Status: DC
  Administered 2015-04-01: 3.375 g via INTRAVENOUS
  Filled 2015-03-31: qty 50

## 2015-03-31 NOTE — ED Notes (Signed)
Per spouse, pt had recent hospitalization for gallbladder removal and stent placement.  Pt has been out of hospital x 2 weeks. Condition not improving.  Pt continues to be weak, having diarrhea and nausea.  MD office suggested coming here.

## 2015-03-31 NOTE — H&P (Signed)
[facility] History and Physical  MAT STUARD PYK:[id number] DOB: [birth year] DOA: 03/31/2015  Referring physician: Charlesetta Shanks, MD PCP: Gara Kroner, MD   Chief Complaint: Diarrhea  HPI: Justin Cohen is a 69 y.o. male with history of recent sepsis cholangitis, recent Lap Cholecystectomy on 03/12/15, Diabetes Mellitus type II and hypertension presents with generalized weakness. Patient states that he feels as if he has not recovered since his surgery. Patient was at Dr [place] [facility] office for blood work. He did not actually see Dr Watt Climes where the wife saw Dr Rosendo Gros upstairs and she told him that the patient was not doing too well since his surgery. She states that he has not been eating very well. He has not been drinking very well either. He has had diarrhea also. He states this is a every other day occurrence. Patient has no blood in the stool. She states that Dr Rosendo Gros suggested to come to the ED. The patient did not actually see Dr Rosendo Gros. In teh ED he had a CT scan which shows some post-op changes vs an early abscess formation. Dr Johnney Killian called the on call surgeon and he suggested admit and start on IV antiiotics.   Review of Systems:  Constitutional:  +weight loss, no night sweats, no Fevers.  HEENT:  No headaches, No sneezing, itching, ear ache, nasal congestion, post nasal drip,  Cardio-vascular:  No chest pain, swelling in lower extremities palpitations  GI:  No heartburn, indigestion, no abdominal pain, +nausea, no vomiting, +diarrhea +loss of appetite  Resp:  No shortness of breath with exertion or at rest. No coughing up of blood.No change in color of mucus Skin:  no rash or lesions GU:  no dysuria, change in color of urine, no urgency or frequency Musculoskeletal:  No joint pain or swelling. No decreased range of motion. No back pain.  Psych:  No change in mood or affect.   Past Medical History  Diagnosis Date  . Diabetes mellitus without complication    . Hypertension   . Hyperlipidemia   . Cancer     prostate cancer  . Cancer     melanoma on nose  . Environmental allergies   . Sleep apnea    Past Surgical History  Procedure Laterality Date  . Joint replacement  02/17/15    left knee replacement  . Joint replacement  2001    right knee replacement  . Joint replacement  2012    "right knee cap replacement"  . Prostate surgery  2009    due to prostate cancer  . Hernia repair  2010  . Melanoma removal  Dec 2015    removed from nose  . Bunionectomy  2014    right foot  . Ercp N/A 03/08/2015    Procedure: ENDOSCOPIC RETROGRADE CHOLANGIOPANCREATOGRAPHY (ERCP);  Surgeon: Inda Castle, MD;  Location: [facility];  Service: Gastroenterology;  Laterality: N/A;  . Ercp N/A 03/10/2015    Procedure: ENDOSCOPIC RETROGRADE CHOLANGIOPANCREATOGRAPHY (ERCP);  Surgeon: Clarene Essex, MD;  Location: [facility];  Service: Endoscopy;  Laterality: N/A;  . Cholecystectomy N/A 03/12/2015    Procedure: LAPAROSCOPIC CHOLECYSTECTOMY CHOLANGIOGRAM WAS NOT PERFORMED;  Surgeon: Ralene Ok, MD;  Location: [facility];  Service: General;  Laterality: N/A;   Social History:  reports that he quit smoking about 42 years ago. His smoking use included Cigarettes. He has never used smokeless tobacco. He reports that he drinks alcohol. He reports that he does not use illicit drugs.  No Known Allergies  Family  History  Problem Relation Age of Onset  . Heart failure Mother   . CAD Father   . Lung cancer Sister      Prior to Admission medications   Medication Sig Start Date End Date Taking? Authorizing Provider  amLODipine-benazepril (LOTREL) 5-10 MG per capsule Take 1 capsule by mouth 2 (two) times daily. 01/27/15  Yes Historical Provider, MD  aspirin 81 MG tablet Take 81 mg by mouth daily.   Yes Historical Provider, MD  cetirizine (ZYRTEC) 10 MG tablet Take 10 mg by mouth daily.   Yes Historical Provider, MD  Cholecalciferol (VITAMIN D) 2000 UNITS tablet Take 2,000  Units by mouth daily.   Yes Historical Provider, MD  dorzolamide-timolol (COSOPT) 22.3-6.8 MG/ML ophthalmic solution Place 1 drop into both eyes 2 (two) times daily.  01/16/15  Yes Historical Provider, MD  fluticasone (FLONASE) 50 MCG/ACT nasal spray Place 1 spray into both nostrils daily as needed for allergies (allergies).    Yes Historical Provider, MD  glipiZIDE (GLUCOTROL) 5 MG tablet Take 5 mg by mouth every morning. 01/21/15  Yes Historical Provider, MD  HYDROcodone-acetaminophen (NORCO) 7.5-325 MG per tablet Take 1-2 tablets by mouth every 4 (four) hours as needed for moderate pain or severe pain (pain). For moderate pain 02/18/15  Yes Historical Provider, MD  metFORMIN (GLUCOPHAGE) 1000 MG tablet Take 1,000 mg by mouth 2 (two) times daily. 02/10/15  Yes Historical Provider, MD  Multiple Vitamin (MULTIVITAMIN WITH MINERALS) TABS tablet Take 1 tablet by mouth every morning.   Yes Historical Provider, MD  Omega-3 Fatty Acids (FISH OIL) 1000 MG CAPS Take 1,000 mg by mouth every morning.   Yes Historical Provider, MD  pioglitazone (ACTOS) 45 MG tablet Take 45 mg by mouth every morning. 01/27/15  Yes Historical Provider, MD  PREVACID 15 MG capsule Take 15 mg by mouth every morning. 02/25/15  Yes Historical Provider, MD  Probiotic Product (PROBIOTIC DAILY PO) Take 1 capsule by mouth daily.   Yes Historical Provider, MD  rosuvastatin (CRESTOR) 5 MG tablet Take 5 mg by mouth at bedtime.   Yes Historical Provider, MD  venlafaxine XR (EFFEXOR-XR) 75 MG 24 hr capsule Take 75 mg by mouth every morning. 12/16/14  Yes Historical Provider, MD  cefUROXime (CEFTIN) 500 MG tablet Take 1 tablet (500 mg total) by mouth 2 (two) times daily with a meal. Patient not taking: Reported on 03/31/2015 03/17/15   Samuella Cota, MD  tiZANidine (ZANAFLEX) 4 MG tablet Take 4 mg by mouth every 6 (six) hours as needed. Muscle spasms 02/20/15   Historical Provider, MD   Physical Exam: Filed Vitals:   03/31/15 1705 03/31/15 2028    BP: 128/69 143/76  Pulse: 100 95  Temp: 98 F (36.7 C)   TempSrc: Oral   Resp: 18 18  SpO2: 97% 96%    Wt Readings from Last 3 Encounters:  03/14/15 129.9 kg (286 lb 6 oz)    General:  Appears calm and comfortable Eyes: PERRL, normal lids ENT: grossly normal hearing Neck: no LAD, masses or thyromegaly Cardiovascular: RRR, no m/r/g Respiratory: CTA bilaterally, no w/r/r Abdomen: soft, ntnd Skin: no rash or induration seen on limited exam Musculoskeletal: grossly normal tone BUE/BLE Psychiatric: grossly normal mood and affect Neurologic: grossly non-focal.          Labs on Admission:  Basic Metabolic Panel:  Recent Labs Lab 03/31/15 1715  NA 136  K 4.2  CL 101  CO2 22  GLUCOSE 154*  BUN 14  CREATININE  0.79  CALCIUM 9.5   Liver Function Tests:  Recent Labs Lab 03/31/15 1715  AST 47*  ALT 46  ALKPHOS 187*  BILITOT 1.6*  PROT 7.4  ALBUMIN 3.4*    Recent Labs Lab 03/31/15 1715  LIPASE 19*   No results for input(s): AMMONIA in the last 168 hours. CBC:  Recent Labs Lab 03/31/15 1715  WBC 8.0  NEUTROABS 4.9  HGB 12.5*  HCT 39.1  MCV 87.5  PLT 397   Cardiac Enzymes:  Recent Labs Lab 03/31/15 1935  TROPONINI <0.03    BNP (last 3 results)  Recent Labs  03/31/15 1935  BNP 15.3    ProBNP (last 3 results) No results for input(s): PROBNP in the last 8760 hours.  CBG: No results for input(s): GLUCAP in the last 168 hours.  Radiological Exams on Admission: Dg Chest 2 View  03/31/2015   CLINICAL DATA:  Generalized weakness and nausea following cholecystectomy  EXAM: CHEST  2 VIEW  COMPARISON:  03/10/2015  FINDINGS: Cardiac shadow is stable. Mild bibasilar atelectatic changes are seen. No focal effusion is noted. No acute bony abnormality is seen. No free air is noted within the abdomen.  IMPRESSION: Mild bibasilar atelectasis.  No acute abnormality is noted.   Electronically Signed   By: Inez Catalina M.D.   On: 03/31/2015 19:45   Ct  Abdomen Pelvis W Contrast  03/31/2015   CLINICAL DATA:  Intermittent diarrhea. Persistent nausea since gallbladder surgery. History hernia repair.  EXAM: CT ABDOMEN AND PELVIS WITH CONTRAST  TECHNIQUE: Multidetector CT imaging of the abdomen and pelvis was performed using the standard protocol following bolus administration of intravenous contrast.  CONTRAST:  157mL OMNIPAQUE IOHEXOL 300 MG/ML  SOLN  COMPARISON:  CT abdomen pelvis - 03/06/2015; ERCP - 03/10/2015; 03/08/2015  FINDINGS: Normal hepatic contour. No discrete hepatic lesions. The patient has undergone interval cholecystectomy with placement of a biliary stent within the CBD. There is a minimal amount of expected pneumobilia within the nondependent portion of the liver.  There is a an ill-defined approximately 4.3 x 2.1 x 2.0 cm hypo attenuating fluid collection within the right lobe of the liver adjacent to the gallbladder fossa (coronal image 43, series 4) which is noted to contain several foci of subcutaneous emphysema. There is a minimal amount of stranding about the gallbladder fossa. No additional discrete hepatic lesions. No ascites.  There is symmetric enhancement and excretion of the bilateral kidneys. Note is made of an approximately 1.3 cm hypo attenuating partially exophytic cyst arising from the interpolar aspect of the right kidney (46, series 3). Additional bilateral subcentimeter hypoattenuating lesions too small likely characterize of favored to represent additional renal cyst cysts. No definite renal stones in this postcontrast examination. There is a minimal amount of grossly symmetric likely body habitus related perinephric stranding. No urinary obstruction. Normal appearance of the bilateral adrenal glands, pancreas and spleen.  Ingested enteric contrast extends to the level of the proximal ascending colon. There is gas distention of the rectum. The bowel is normal in course and caliber without wall thickening or evidence of  obstruction. Normal appearance of the appendix. No pneumoperitoneum, pneumatosis or portal venous gas. Small hiatal hernia.  Scattered atherosclerotic plaque within a normal caliber abdominal aorta. The major branch vessels of the abdominal aorta appear patent on this non CTA examination. Scattered retroperitoneal lymph nodes individually not enlarged by size criteria including index periaortic lymph node at the level of the diaphragmatic hiatus, measuring approximately 0.7 cm in diameter. No  retroperitoneal, mesenteric, pelvic or inguinal lymphadenopathy.  Normal appearance of the pelvic organs. Normal appearance of the urinary bladder given degree distention. No free fluid in the pelvic cul-de-sac.  Limited visualization of the lower thorax demonstrates scatter nodule within the imaged bilateral lower lobes with dominant nodule within the left lower lobe measuring 8 mm in diameter (image 16, series 7 with additional 6 mm pulmonary nodules seen within the right lower lobe (image 5, series 7) and the left lower lobe (images 15 and 17). There is minimal bibasilar subsegmental atelectasis. No focal airspace opacities. No pleural effusion.  Normal heart size. Coronary artery calcifications. No pericardial effusion.  No acute or aggressive osseous abnormalities. Moderate multilevel lumbar spine DDD, worse at L3-L4 and L4-L5 with disc space height loss, endplate irregularity and small posteriorly directed disc osteophyte complexes at these locations. Mild scoliotic curvature of the thoracolumbar spine, convex to the left. Stigmata of dish within the caudal aspect of the thoracic spine. Mild degenerative change of the bilateral hips, right greater than left.  Post mesh repair of the right lateral ventral abdominal wall. Unchanged small mesenteric fat containing periumbilical hernia. Unchanged small bilateral mesenteric fat containing inguinal hernias, left greater than right.  IMPRESSION: 1. Post cholecystectomy and  placement of a biliary stent. 2. Interval development of an ill-defined approximately 4.3 cm mixed air and fluid containing structure within the right lobe of the liver adjacent to the gallbladder fossa - nonspecific and while potentially postoperative in etiology, a developing poorly defined hepatic abscess could have a similar appearance. Clinical correlation is advised. 3. Unchanged pulmonary nodules within the imaged bilateral lower lobes, the largest of which within the left lower lobe measures 8 mm in diameter. If the patient is at high risk for bronchogenic carcinoma, follow-up chest CT at 3-29months is recommended. If the patient is at low risk for bronchogenic carcinoma, follow-up chest CT at 6-12 months is recommended. This recommendation follows the consensus statement: Guidelines for Management of Small Pulmonary Nodules Detected on CT Scans: A Statement from the [employer] as published in Radiology 2005; 237:395-400.   Electronically Signed   By: Sandi Mariscal M.D.   On: 03/31/2015 22:48      Assessment/Plan Active Problems:   HTN (hypertension)   DM w/o complication type II   Hyperlipidemia   Pericholecystic abscess   1. Pericholecystic Fluid collection -in setting of recent Lap Cholecystectomy may represent developing abscess or could be post-op changes. Dr Johnney Killian spoke with surgery on call and they suggested he be admitted for observation and IV antibiotics -will start on IV Zosyn and Flagyl per recommendations  2. HTN -continue with antihypertensives -will monitor pressures  3. DM Type II -will continue with present medications -check FSBS as ordered -will start on SSI as needed  4. Hyperlipidemia -will monitor labs  5. Abnormal LFTs -his numbers actually appear to be improving -will monitor labs  6. Diarrhea -will collect stool for C diff   Code Status: Heparin DVT Prophylaxis:SCD Family Communication: None (indicate person spoken with, if applicable,  with phone number if by telephone) Disposition Plan: Home (indicate anticipated LOS)  Time spent: 22min  KHAN,SAADAT A [facility] Pager [phone]

## 2015-03-31 NOTE — ED Notes (Signed)
Returned from CT.
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
## 2015-03-31 NOTE — ED Provider Notes (Signed)
CSN: [id number]     Arrival date & time 03/31/15  1658 History   First MD Initiated Contact with Patient 03/31/15 1852     Chief Complaint  Patient presents with  . Weakness  . Diarrhea     (Consider location/radiation/quality/duration/timing/severity/associated sxs/prior Treatment) HPI The patient states that since his surgery he has still been very weak and had a difficult time tolerating oral intake. He reports he is taking about 16 ounces of water and a protein drink today. With any exertion he continues to feel very fatigued. The patient reports he spends much of his stay still resting or sleeping. He is not having significant abdominal pain. There has been no fever. He does report about every other day or so he's having a watery loose stool. There has been no vomiting. The patient denies chest pain or shortness of breath. He was seen by his surgeon today and reportedly these persisting symptoms, he was advised to come to the emergency department for assessment for possible dehydration. Past Medical History  Diagnosis Date  . Diabetes mellitus without complication   . Hypertension   . Hyperlipidemia   . Cancer     prostate cancer  . Cancer     melanoma on nose  . Environmental allergies   . Sleep apnea    Past Surgical History  Procedure Laterality Date  . Joint replacement  02/17/15    left knee replacement  . Joint replacement  2001    right knee replacement  . Joint replacement  2012    "right knee cap replacement"  . Prostate surgery  2009    due to prostate cancer  . Hernia repair  2010  . Melanoma removal  Dec 2015    removed from nose  . Bunionectomy  2014    right foot  . Ercp N/A 03/08/2015    Procedure: ENDOSCOPIC RETROGRADE CHOLANGIOPANCREATOGRAPHY (ERCP);  Surgeon: Inda Castle, MD;  Location: [facility];  Service: Gastroenterology;  Laterality: N/A;  . Ercp N/A 03/10/2015    Procedure: ENDOSCOPIC RETROGRADE CHOLANGIOPANCREATOGRAPHY (ERCP);  Surgeon: Clarene Essex,  MD;  Location: [facility];  Service: Endoscopy;  Laterality: N/A;  . Cholecystectomy N/A 03/12/2015    Procedure: LAPAROSCOPIC CHOLECYSTECTOMY CHOLANGIOGRAM WAS NOT PERFORMED;  Surgeon: Ralene Ok, MD;  Location: [facility];  Service: General;  Laterality: N/A;   Family History  Problem Relation Age of Onset  . Heart failure Mother   . CAD Father   . Lung cancer Sister    History  Substance Use Topics  . Smoking status: Former Smoker    Types: Cigarettes    Quit date: 03/05/1973  . Smokeless tobacco: Never Used  . Alcohol Use: Yes     Comment: occasional wine    Review of Systems   10 Systems reviewed and are negative for acute change except as noted in the HPI.  Allergies  Review of patient's allergies indicates no known allergies.  Home Medications   Prior to Admission medications   Medication Sig Start Date End Date Taking? Authorizing Provider  amLODipine-benazepril (LOTREL) 5-10 MG per capsule Take 1 capsule by mouth 2 (two) times daily. 01/27/15  Yes Historical Provider, MD  aspirin 81 MG tablet Take 81 mg by mouth daily.   Yes Historical Provider, MD  cetirizine (ZYRTEC) 10 MG tablet Take 10 mg by mouth daily.   Yes Historical Provider, MD  Cholecalciferol (VITAMIN D) 2000 UNITS tablet Take 2,000 Units by mouth daily.   Yes Historical Provider, MD  dorzolamide-timolol (COSOPT) 22.3-6.8 MG/ML ophthalmic solution Place 1 drop into both eyes 2 (two) times daily.  01/16/15  Yes Historical Provider, MD  fluticasone (FLONASE) 50 MCG/ACT nasal spray Place 1 spray into both nostrils daily as needed for allergies (allergies).    Yes Historical Provider, MD  glipiZIDE (GLUCOTROL) 5 MG tablet Take 5 mg by mouth every morning. 01/21/15  Yes Historical Provider, MD  HYDROcodone-acetaminophen (NORCO) 7.5-325 MG per tablet Take 1-2 tablets by mouth every 4 (four) hours as needed for moderate pain or severe pain (pain). For moderate pain 02/18/15  Yes Historical Provider, MD  metFORMIN  (GLUCOPHAGE) 1000 MG tablet Take 1,000 mg by mouth 2 (two) times daily. 02/10/15  Yes Historical Provider, MD  Multiple Vitamin (MULTIVITAMIN WITH MINERALS) TABS tablet Take 1 tablet by mouth every morning.   Yes Historical Provider, MD  Omega-3 Fatty Acids (FISH OIL) 1000 MG CAPS Take 1,000 mg by mouth every morning.   Yes Historical Provider, MD  pioglitazone (ACTOS) 45 MG tablet Take 45 mg by mouth every morning. 01/27/15  Yes Historical Provider, MD  PREVACID 15 MG capsule Take 15 mg by mouth every morning. 02/25/15  Yes Historical Provider, MD  Probiotic Product (PROBIOTIC DAILY PO) Take 1 capsule by mouth daily.   Yes Historical Provider, MD  rosuvastatin (CRESTOR) 5 MG tablet Take 5 mg by mouth at bedtime.   Yes Historical Provider, MD  venlafaxine XR (EFFEXOR-XR) 75 MG 24 hr capsule Take 75 mg by mouth every morning. 12/16/14  Yes Historical Provider, MD  cefUROXime (CEFTIN) 500 MG tablet Take 1 tablet (500 mg total) by mouth 2 (two) times daily with a meal. Patient not taking: Reported on 03/31/2015 03/17/15   Samuella Cota, MD  tiZANidine (ZANAFLEX) 4 MG tablet Take 4 mg by mouth every 6 (six) hours as needed. Muscle spasms 02/20/15   Historical Provider, MD   BP 143/76 mmHg  Pulse 95  Temp(Src) 98 F (36.7 C) (Oral)  Resp 18  SpO2 96% Physical Exam  Constitutional: He is oriented to person, place, and time. He appears well-developed and well-nourished.  HENT:  Head: Normocephalic and atraumatic.  Eyes: EOM are normal. Pupils are equal, round, and reactive to light.  Neck: Neck supple.  Cardiovascular: Normal rate, regular rhythm, normal heart sounds and intact distal pulses.   Pulmonary/Chest: Effort normal and breath sounds normal.  Abdominal: Soft. Bowel sounds are normal. He exhibits no distension. There is no tenderness.  There are healing small surgical scars on the abdomen without erythema or drainage. The abdomen is soft without significant reproducible tenderness.   Musculoskeletal: Normal range of motion. He exhibits no edema or tenderness.  Neurological: He is alert and oriented to person, place, and time. He has normal strength. Coordination normal. GCS eye subscore is 4. GCS verbal subscore is 5. GCS motor subscore is 6.  Skin: Skin is warm, dry and intact.  Psychiatric: He has a normal mood and affect.    ED Course  Procedures (including critical care time) Labs Review Labs Reviewed  COMPREHENSIVE METABOLIC PANEL - Abnormal; Notable for the following:    Glucose, Bld 154 (*)    Albumin 3.4 (*)    AST 47 (*)    Alkaline Phosphatase 187 (*)    Total Bilirubin 1.6 (*)    All other components within normal limits  LIPASE, BLOOD - Abnormal; Notable for the following:    Lipase 19 (*)    All other components within normal limits  CBC WITH  DIFFERENTIAL/PLATELET - Abnormal; Notable for the following:    Hemoglobin 12.5 (*)    RDW 15.8 (*)    All other components within normal limits  URINALYSIS, ROUTINE W REFLEX MICROSCOPIC - Abnormal; Notable for the following:    Color, Urine AMBER (*)    All other components within normal limits  TROPONIN I  BRAIN NATRIURETIC PEPTIDE  PROTIME-INR  I-STAT CG4 LACTIC ACID, ED    Imaging Review Dg Chest 2 View  03/31/2015   CLINICAL DATA:  Generalized weakness and nausea following cholecystectomy  EXAM: CHEST  2 VIEW  COMPARISON:  03/10/2015  FINDINGS: Cardiac shadow is stable. Mild bibasilar atelectatic changes are seen. No focal effusion is noted. No acute bony abnormality is seen. No free air is noted within the abdomen.  IMPRESSION: Mild bibasilar atelectasis.  No acute abnormality is noted.   Electronically Signed   By: Inez Catalina M.D.   On: 03/31/2015 19:45   Ct Abdomen Pelvis W Contrast  03/31/2015   CLINICAL DATA:  Intermittent diarrhea. Persistent nausea since gallbladder surgery. History hernia repair.  EXAM: CT ABDOMEN AND PELVIS WITH CONTRAST  TECHNIQUE: Multidetector CT imaging of the abdomen  and pelvis was performed using the standard protocol following bolus administration of intravenous contrast.  CONTRAST:  118mL OMNIPAQUE IOHEXOL 300 MG/ML  SOLN  COMPARISON:  CT abdomen pelvis - 03/06/2015; ERCP - 03/10/2015; 03/08/2015  FINDINGS: Normal hepatic contour. No discrete hepatic lesions. The patient has undergone interval cholecystectomy with placement of a biliary stent within the CBD. There is a minimal amount of expected pneumobilia within the nondependent portion of the liver.  There is a an ill-defined approximately 4.3 x 2.1 x 2.0 cm hypo attenuating fluid collection within the right lobe of the liver adjacent to the gallbladder fossa (coronal image 43, series 4) which is noted to contain several foci of subcutaneous emphysema. There is a minimal amount of stranding about the gallbladder fossa. No additional discrete hepatic lesions. No ascites.  There is symmetric enhancement and excretion of the bilateral kidneys. Note is made of an approximately 1.3 cm hypo attenuating partially exophytic cyst arising from the interpolar aspect of the right kidney (46, series 3). Additional bilateral subcentimeter hypoattenuating lesions too small likely characterize of favored to represent additional renal cyst cysts. No definite renal stones in this postcontrast examination. There is a minimal amount of grossly symmetric likely body habitus related perinephric stranding. No urinary obstruction. Normal appearance of the bilateral adrenal glands, pancreas and spleen.  Ingested enteric contrast extends to the level of the proximal ascending colon. There is gas distention of the rectum. The bowel is normal in course and caliber without wall thickening or evidence of obstruction. Normal appearance of the appendix. No pneumoperitoneum, pneumatosis or portal venous gas. Small hiatal hernia.  Scattered atherosclerotic plaque within a normal caliber abdominal aorta. The major branch vessels of the abdominal aorta  appear patent on this non CTA examination. Scattered retroperitoneal lymph nodes individually not enlarged by size criteria including index periaortic lymph node at the level of the diaphragmatic hiatus, measuring approximately 0.7 cm in diameter. No retroperitoneal, mesenteric, pelvic or inguinal lymphadenopathy.  Normal appearance of the pelvic organs. Normal appearance of the urinary bladder given degree distention. No free fluid in the pelvic cul-de-sac.  Limited visualization of the lower thorax demonstrates scatter nodule within the imaged bilateral lower lobes with dominant nodule within the left lower lobe measuring 8 mm in diameter (image 16, series 7 with additional 6 mm pulmonary nodules  seen within the right lower lobe (image 5, series 7) and the left lower lobe (images 15 and 17). There is minimal bibasilar subsegmental atelectasis. No focal airspace opacities. No pleural effusion.  Normal heart size. Coronary artery calcifications. No pericardial effusion.  No acute or aggressive osseous abnormalities. Moderate multilevel lumbar spine DDD, worse at L3-L4 and L4-L5 with disc space height loss, endplate irregularity and small posteriorly directed disc osteophyte complexes at these locations. Mild scoliotic curvature of the thoracolumbar spine, convex to the left. Stigmata of dish within the caudal aspect of the thoracic spine. Mild degenerative change of the bilateral hips, right greater than left.  Post mesh repair of the right lateral ventral abdominal wall. Unchanged small mesenteric fat containing periumbilical hernia. Unchanged small bilateral mesenteric fat containing inguinal hernias, left greater than right.  IMPRESSION: 1. Post cholecystectomy and placement of a biliary stent. 2. Interval development of an ill-defined approximately 4.3 cm mixed air and fluid containing structure within the right lobe of the liver adjacent to the gallbladder fossa - nonspecific and while potentially postoperative  in etiology, a developing poorly defined hepatic abscess could have a similar appearance. Clinical correlation is advised. 3. Unchanged pulmonary nodules within the imaged bilateral lower lobes, the largest of which within the left lower lobe measures 8 mm in diameter. If the patient is at high risk for bronchogenic carcinoma, follow-up chest CT at 3-51months is recommended. If the patient is at low risk for bronchogenic carcinoma, follow-up chest CT at 6-12 months is recommended. This recommendation follows the consensus statement: Guidelines for Management of Small Pulmonary Nodules Detected on CT Scans: A Statement from the [place] as published in Radiology 2005; 237:395-400.   Electronically Signed   By: Sandi Mariscal M.D.   On: 03/31/2015 22:48     EKG Interpretation None     Consult:21:00 had extensive review of the patient's history and findings with Dr. Johney Maine covering for [place] surgery. At this time he recommends pursuing CT abdomen to rule out biloma or abscess. He also suggests consultation with gastroenterology as they have evaluated the patient today Consult: GI Dr. Michail Sermon based on CT findings patient can be admitted to hospitalist and consult tomorrow for Dr. Daisey Must. MDM   Final diagnoses:  Weakness  Diarrhea  Post-operative state  Intra-abdominal abscess post-procedure, initial encounter   At this time the patient will be admitted on Zosyn and Flagyl. He will be seen by gastroenterology tomorrow to further assess for possible abscess postoperative. At this time the patient is nontoxic and does not show any signs of sepsis.    Charlesetta Shanks, MD 03/31/15 [phone]

## 2015-03-31 NOTE — Patient Outreach (Signed)
[facility]) Care Management  03/31/2015  Justin Cohen 19-May-1946 [id number]   SUBJECTIVE: Telephone call to patient regarding [facility] referral.  HIPAA verified with patient.  Discussed and offered [facility] Care management services to patient.  Patient refused services.  PLAN: RNCM will refer patient to [name] to close due to refusal of services.  RNCN will notify patients primary MD and [name] with [facility] of refusal of services.   [name] RN,BSN,CCM [place] Coordinator [phone]

## 2015-04-01 ENCOUNTER — Inpatient Hospital Stay (HOSPITAL_COMMUNITY): Payer: Commercial Managed Care - HMO

## 2015-04-01 ENCOUNTER — Encounter (HOSPITAL_COMMUNITY): Payer: Self-pay | Admitting: Radiology

## 2015-04-01 DIAGNOSIS — I10 Essential (primary) hypertension: Secondary | ICD-10-CM

## 2015-04-01 DIAGNOSIS — E785 Hyperlipidemia, unspecified: Secondary | ICD-10-CM

## 2015-04-01 DIAGNOSIS — L0291 Cutaneous abscess, unspecified: Secondary | ICD-10-CM | POA: Insufficient documentation

## 2015-04-01 DIAGNOSIS — E119 Type 2 diabetes mellitus without complications: Secondary | ICD-10-CM

## 2015-04-01 LAB — CBC
HCT: 35.7 % — ABNORMAL LOW (ref 39.0–52.0)
Hemoglobin: 11.5 g/dL — ABNORMAL LOW (ref 13.0–17.0)
MCH: 28.1 pg (ref 26.0–34.0)
MCHC: 32.2 g/dL (ref 30.0–36.0)
MCV: 87.3 fL (ref 78.0–100.0)
Platelets: 335 10*3/uL (ref 150–400)
RBC: 4.09 MIL/uL — AB (ref 4.22–5.81)
RDW: 15.9 % — ABNORMAL HIGH (ref 11.5–15.5)
WBC: 5.9 10*3/uL (ref 4.0–10.5)

## 2015-04-01 LAB — GLUCOSE, CAPILLARY
GLUCOSE-CAPILLARY: 131 mg/dL — AB (ref 65–99)
GLUCOSE-CAPILLARY: 118 mg/dL — AB (ref 65–99)
Glucose-Capillary: 168 mg/dL — ABNORMAL HIGH (ref 65–99)
Glucose-Capillary: 156 mg/dL — ABNORMAL HIGH (ref 65–99)
Glucose-Capillary: 152 mg/dL — ABNORMAL HIGH (ref 65–99)

## 2015-04-01 LAB — PROTIME-INR
INR: 1.12 (ref 0.00–1.49)
Prothrombin Time: 14.6 seconds (ref 11.6–15.2)

## 2015-04-01 LAB — COMPREHENSIVE METABOLIC PANEL
ALBUMIN: 3.2 g/dL — AB (ref 3.5–5.0)
ALK PHOS: 167 U/L — AB (ref 38–126)
ALT: 42 U/L (ref 17–63)
AST: 41 U/L (ref 15–41)
Anion gap: 11 (ref 5–15)
BILIRUBIN TOTAL: 1.5 mg/dL — AB (ref 0.3–1.2)
BUN: 12 mg/dL (ref 6–20)
CO2: 25 mmol/L (ref 22–32)
CREATININE: 0.84 mg/dL (ref 0.61–1.24)
Calcium: 9.1 mg/dL (ref 8.9–10.3)
Chloride: 100 mmol/L — ABNORMAL LOW (ref 101–111)
GFR calc Af Amer: >60 mL/min (ref >60)
GFR calc non Af Amer: >60 mL/min (ref >60)
Glucose, Bld: 190 mg/dL — ABNORMAL HIGH (ref 65–99)
Potassium: 3.5 mmol/L (ref 3.5–5.1)
Sodium: 136 mmol/L (ref 135–145)
TOTAL PROTEIN: 6.7 g/dL (ref 6.5–8.1)

## 2015-04-01 LAB — LIPID PANEL
Cholesterol: 164 mg/dL (ref 0–200)
HDL: 33 mg/dL — ABNORMAL LOW (ref >40)
LDL Cholesterol: 100 mg/dL — ABNORMAL HIGH (ref 0–99)
Total CHOL/HDL Ratio: 5 RATIO
Triglycerides: 157 mg/dL — ABNORMAL HIGH (ref <150)
VLDL: 31 mg/dL (ref 0–40)

## 2015-04-01 LAB — TSH: TSH: 0.711 u[IU]/mL (ref 0.350–4.500)

## 2015-04-01 MED ORDER — PIOGLITAZONE HCL 45 MG PO TABS
45.0000 mg | ORAL_TABLET | Freq: Every day | ORAL | Status: DC
  Administered 2015-04-01 – 2015-04-02 (×2): 45 mg via ORAL
  Filled 2015-04-01 (×2): qty 1

## 2015-04-01 MED ORDER — MIDAZOLAM HCL 2 MG/2ML IJ SOLN
INTRAMUSCULAR | Status: AC
  Filled 2015-04-01: qty 6

## 2015-04-01 MED ORDER — NALOXONE HCL 0.4 MG/ML IJ SOLN
INTRAMUSCULAR | Status: AC
  Filled 2015-04-01: qty 1

## 2015-04-01 MED ORDER — BENAZEPRIL HCL 10 MG PO TABS
10.0000 mg | ORAL_TABLET | Freq: Two times a day (BID) | ORAL | Status: DC
  Administered 2015-04-01 – 2015-04-02 (×4): 10 mg via ORAL
  Filled 2015-04-01 (×5): qty 1

## 2015-04-01 MED ORDER — FOLIC ACID 1 MG PO TABS
1.0000 mg | ORAL_TABLET | Freq: Every day | ORAL | Status: DC
  Filled 2015-04-01: qty 1

## 2015-04-01 MED ORDER — HYDROCODONE-ACETAMINOPHEN 7.5-325 MG PO TABS
1.0000 | ORAL_TABLET | ORAL | Status: DC | PRN
Start: 2015-04-01 — End: 2015-04-02
  Administered 2015-04-01: 1 via ORAL
  Filled 2015-04-01: qty 1

## 2015-04-01 MED ORDER — FLUTICASONE PROPIONATE 50 MCG/ACT NA SUSP: 1.0000 | Freq: Every day | NASAL | Status: DC | PRN

## 2015-04-01 MED ORDER — SODIUM CHLORIDE 0.9 % IV SOLN
INTRAVENOUS | Status: DC
Start: 2015-04-01 — End: 2015-04-02
  Administered 2015-04-01: 12:00:00 via INTRAVENOUS
  Administered 2015-04-01: 50 mL/h via INTRAVENOUS

## 2015-04-01 MED ORDER — ACETAMINOPHEN 325 MG PO TABS: 650.0000 mg | ORAL_TABLET | Freq: Four times a day (QID) | ORAL | Status: DC | PRN

## 2015-04-01 MED ORDER — FENTANYL CITRATE (PF) 100 MCG/2ML IJ SOLN
INTRAMUSCULAR | Status: AC
  Filled 2015-04-01: qty 6

## 2015-04-01 MED ORDER — VITAMIN B-1 100 MG PO TABS
100.0000 mg | ORAL_TABLET | Freq: Every day | ORAL | Status: DC
Start: 2015-04-01 — End: 2015-04-01
  Filled 2015-04-01: qty 1

## 2015-04-01 MED ORDER — DORZOLAMIDE HCL-TIMOLOL MAL 2-0.5 % OP SOLN
1.0000 [drp] | Freq: Two times a day (BID) | OPHTHALMIC | Status: DC
  Administered 2015-04-01 – 2015-04-02 (×4): 1 [drp] via OPHTHALMIC
  Filled 2015-04-01: qty 10

## 2015-04-01 MED ORDER — TIZANIDINE HCL 4 MG PO TABS
4.0000 mg | ORAL_TABLET | Freq: Four times a day (QID) | ORAL | Status: DC | PRN
  Filled 2015-04-01: qty 1

## 2015-04-01 MED ORDER — FENTANYL CITRATE (PF) 100 MCG/2ML IJ SOLN
INTRAMUSCULAR | Status: AC | PRN
  Administered 2015-04-01: 50 ug via INTRAVENOUS

## 2015-04-01 MED ORDER — GLIPIZIDE 5 MG PO TABS
5.0000 mg | ORAL_TABLET | Freq: Every day | ORAL | Status: DC
  Administered 2015-04-02: 5 mg via ORAL
  Filled 2015-04-01 (×3): qty 1

## 2015-04-01 MED ORDER — GLUCERNA SHAKE PO LIQD
237.0000 mL | Freq: Three times a day (TID) | ORAL | Status: DC
  Administered 2015-04-01: 237 mL via ORAL
  Filled 2015-04-01 (×3): qty 237

## 2015-04-01 MED ORDER — AMLODIPINE BESYLATE 5 MG PO TABS
5.0000 mg | ORAL_TABLET | Freq: Two times a day (BID) | ORAL | Status: DC
  Administered 2015-04-01 – 2015-04-02 (×4): 5 mg via ORAL
  Filled 2015-04-01 (×5): qty 1

## 2015-04-01 MED ORDER — FLUMAZENIL 0.5 MG/5ML IV SOLN
INTRAVENOUS | Status: AC
  Filled 2015-04-01: qty 5

## 2015-04-01 MED ORDER — BOOST / RESOURCE BREEZE PO LIQD
1.0000 | Freq: Three times a day (TID) | ORAL | Status: DC
  Administered 2015-04-01: 1 via ORAL

## 2015-04-01 MED ORDER — AMLODIPINE BESY-BENAZEPRIL HCL 5-10 MG PO CAPS: 1.0000 | ORAL_CAPSULE | Freq: Two times a day (BID) | ORAL | Status: DC

## 2015-04-01 MED ORDER — PIPERACILLIN-TAZOBACTAM 3.375 G IVPB
3.3750 g | Freq: Three times a day (TID) | INTRAVENOUS | Status: DC
  Administered 2015-04-01 – 2015-04-02 (×4): 3.375 g via INTRAVENOUS
  Filled 2015-04-01 (×5): qty 50

## 2015-04-01 MED ORDER — PANTOPRAZOLE SODIUM 20 MG PO TBEC
20.0000 mg | DELAYED_RELEASE_TABLET | Freq: Every day | ORAL | Status: DC
  Administered 2015-04-01 – 2015-04-02 (×2): 20 mg via ORAL
  Filled 2015-04-01 (×2): qty 1

## 2015-04-01 MED ORDER — INSULIN ASPART 100 UNIT/ML ~~LOC~~ SOLN
0.0000 [IU] | Freq: Three times a day (TID) | SUBCUTANEOUS | Status: DC
  Administered 2015-04-01 (×2): 3 [IU] via SUBCUTANEOUS
  Administered 2015-04-02: 2 [IU] via SUBCUTANEOUS

## 2015-04-01 MED ORDER — MIDAZOLAM HCL 2 MG/2ML IJ SOLN
INTRAMUSCULAR | Status: AC | PRN
  Administered 2015-04-01: 1 mg via INTRAVENOUS

## 2015-04-01 MED ORDER — ENOXAPARIN SODIUM 60 MG/0.6ML ~~LOC~~ SOLN
60.0000 mg | SUBCUTANEOUS | Status: DC
  Administered 2015-04-02: 60 mg via SUBCUTANEOUS
  Filled 2015-04-01: qty 0.6

## 2015-04-01 MED ORDER — MORPHINE SULFATE 2 MG/ML IJ SOLN: 1.0000 mg | INTRAMUSCULAR | Status: DC | PRN

## 2015-04-01 MED ORDER — METRONIDAZOLE IN NACL 5-0.79 MG/ML-% IV SOLN
500.0000 mg | Freq: Three times a day (TID) | INTRAVENOUS | Status: DC
  Administered 2015-04-01 – 2015-04-02 (×4): 500 mg via INTRAVENOUS
  Filled 2015-04-01 (×5): qty 100

## 2015-04-01 MED ORDER — VENLAFAXINE HCL ER 75 MG PO CP24
75.0000 mg | ORAL_CAPSULE | Freq: Every day | ORAL | Status: DC
  Administered 2015-04-01 – 2015-04-02 (×2): 75 mg via ORAL
  Filled 2015-04-01 (×2): qty 1

## 2015-04-01 MED ORDER — ADULT MULTIVITAMIN W/MINERALS CH
1.0000 | ORAL_TABLET | Freq: Every day | ORAL | Status: DC
Start: 2015-04-01 — End: 2015-04-01
  Filled 2015-04-01: qty 1

## 2015-04-01 MED ORDER — METFORMIN HCL 500 MG PO TABS
1000.0000 mg | ORAL_TABLET | Freq: Two times a day (BID) | ORAL | Status: DC
  Administered 2015-04-01 – 2015-04-02 (×2): 1000 mg via ORAL
  Filled 2015-04-01 (×5): qty 2

## 2015-04-01 MED ORDER — ACETAMINOPHEN 650 MG RE SUPP
650.0000 mg | Freq: Four times a day (QID) | RECTAL | Status: DC | PRN
Start: 2015-04-01 — End: 2015-04-02

## 2015-04-01 NOTE — Progress Notes (Signed)
ANTICOAGULATION CONSULT NOTE - Initial Consult  Pharmacy Consult for Lovenox Indication: VTE prophylaxis  No Known Allergies  Patient Measurements: Height: 5\' 9"  (175.3 cm) Weight: 271 lb 2.7 oz (123 kg) IBW/kg (Calculated) : 70.7  Vital Signs: Temp: 98.6 F (37 C) (05/25 1130) Temp Source: Oral (05/25 1130) BP: 121/73 mmHg (05/25 1130) Pulse Rate: 81 (05/25 1130)  Labs:  Recent Labs  03/31/15 1715 03/31/15 1935 04/01/15 0454  HGB 12.5*  --  11.5*  HCT 39.1  --  35.7*  PLT 397  --  335  LABPROT  --  14.0 14.6  INR  --  1.06 1.12  CREATININE 0.79  --  0.84  TROPONINI  --  <0.03  --     Estimated Creatinine Clearance: 104.5 mL/min (by C-G formula based on Cr of 0.84).   Medical History: Past Medical History  Diagnosis Date  . Diabetes mellitus without complication   . Hypertension   . Hyperlipidemia   . Cancer     prostate cancer  . Cancer     melanoma on nose  . Environmental allergies   . Sleep apnea     Assessment: 35 y/oM with PMH of HTN, DM, HLD, prostate CA, recent hospitalization for sepsis 2/2 E.coli bacteremia 2/2 cholangitis and ischemic/necrotic cholecystitis, s/p lap cholecystectomy 5/5, and s/p L knee replacement 4/12 who presented to [facility] ED with generalized weakness and diarrhea. CT abdomen showed a fluid collection and possible abscess within the right lobe of the liver adjacent to the gallbladder fossa. Patient went for CT-guided aspiration/drainage of GB fossa fluid collection today. Pharmacy consulted to assist with dosing of Lovenox for VTE prophylaxis. Patient also ordered SCDs.    SCr 0.84, CrCl > 100 mL/min  Hgb stable at 11.5, Pltc WNL  BMI ~ 40  Goal of Therapy:  Absence of VTE Monitor platelets by anticoagulation protocol: Yes   Plan:   Lovenox to start on 5/26 AM per d/w TRH due to CT guided aspiration of gallbladder fossa fluid collection today.  Start Lovenox 60 mg (0.5 mg/kg) SQ daily on 5/26 at 1000 for BMI > 30, CrCl >  30 mL/min.  Monitor renal function, CBC, and for s/s of bleeding.   Lindell Spar, PharmD, BCPS Pager: [phone] 04/01/2015 12:14 PM

## 2015-04-01 NOTE — Progress Notes (Signed)
Inpatient Diabetes Program Recommendations  AACE/ADA: New Consensus Statement on Inpatient Glycemic Control (2013)  Target Ranges:  Prepandial:   less than 140 mg/dL      Peak postprandial:   less than 180 mg/dL (1-2 hours)      Critically ill patients:  140 - 180 mg/dL  Inpatient Diabetes Program Recommendations Insulin - Basal: May need if fastings are elevated greater than 150 mg/dl Insulin - Meal Coverage: Please consider using only correction insulin tidwc and HS correction Oral Agents: Due to potential  liver involvement  with current medical status, while here may want to use only insulin rather than the oral agents pt uses at home. If NPO, could use correction q 4 hrs if needed  Thank you Rosita Kea, RN, MSN, CDE  Diabetes Inpatient Program Office: [phone] Pager: [phone] 8:00 am to 5:00 pm

## 2015-04-01 NOTE — Progress Notes (Signed)
Patient ID: Justin Cohen, male   DOB: 1946/09/03, 69 y.o.   MRN: [id number]    Referring Physician(s): Magod, M  Subjective:   Request received from GI service for CT-guided aspiration and possible drainage of gallbladder fossa fluid collection. Patient has prior history of recent cholangitis/ choledocholithiasis , status post laparoscopic cholecystectomy on  03/12/15. He was recently admitted with generalized weakness , nausea, poor appetite as well as diarrhea. Patient has a common bile duct stent in for distal stricture. Recent CT demonstrated a 4.3 cm mixed air-fluid collection within the right lower liver adjacent to the gallbladder fossa concerning for possible abscess. Patient currently denies significant abdominal pain, nausea, vomiting, chest pain, dyspnea, fever or chills.   Allergies: Review of patient's allergies indicates no known allergies.  Medications: Prior to Admission medications   Medication Sig Start Date End Date Taking? Authorizing Provider  amLODipine-benazepril (LOTREL) 5-10 MG per capsule Take 1 capsule by mouth 2 (two) times daily. 01/27/15  Yes Historical Provider, MD  aspirin 81 MG tablet Take 81 mg by mouth daily.   Yes Historical Provider, MD  cetirizine (ZYRTEC) 10 MG tablet Take 10 mg by mouth daily.   Yes Historical Provider, MD  Cholecalciferol (VITAMIN D) 2000 UNITS tablet Take 2,000 Units by mouth daily.   Yes Historical Provider, MD  dorzolamide-timolol (COSOPT) 22.3-6.8 MG/ML ophthalmic solution Place 1 drop into both eyes 2 (two) times daily.  01/16/15  Yes Historical Provider, MD  fluticasone (FLONASE) 50 MCG/ACT nasal spray Place 1 spray into both nostrils daily as needed for allergies (allergies).    Yes Historical Provider, MD  glipiZIDE (GLUCOTROL) 5 MG tablet Take 5 mg by mouth every morning. 01/21/15  Yes Historical Provider, MD  HYDROcodone-acetaminophen (NORCO) 7.5-325 MG per tablet Take 1-2 tablets by mouth every 4 (four) hours as needed for  moderate pain or severe pain (pain). For moderate pain 02/18/15  Yes Historical Provider, MD  metFORMIN (GLUCOPHAGE) 1000 MG tablet Take 1,000 mg by mouth 2 (two) times daily. 02/10/15  Yes Historical Provider, MD  Multiple Vitamin (MULTIVITAMIN WITH MINERALS) TABS tablet Take 1 tablet by mouth every morning.   Yes Historical Provider, MD  Omega-3 Fatty Acids (FISH OIL) 1000 MG CAPS Take 1,000 mg by mouth every morning.   Yes Historical Provider, MD  pioglitazone (ACTOS) 45 MG tablet Take 45 mg by mouth every morning. 01/27/15  Yes Historical Provider, MD  PREVACID 15 MG capsule Take 15 mg by mouth every morning. 02/25/15  Yes Historical Provider, MD  Probiotic Product (PROBIOTIC DAILY PO) Take 1 capsule by mouth daily.   Yes Historical Provider, MD  rosuvastatin (CRESTOR) 5 MG tablet Take 5 mg by mouth at bedtime.   Yes Historical Provider, MD  venlafaxine XR (EFFEXOR-XR) 75 MG 24 hr capsule Take 75 mg by mouth every morning. 12/16/14  Yes Historical Provider, MD  cefUROXime (CEFTIN) 500 MG tablet Take 1 tablet (500 mg total) by mouth 2 (two) times daily with a meal. Patient not taking: Reported on 03/31/2015 03/17/15   Samuella Cota, MD  tiZANidine (ZANAFLEX) 4 MG tablet Take 4 mg by mouth every 6 (six) hours as needed. Muscle spasms 02/20/15   Historical Provider, MD     Vital Signs: BP 164/100 mmHg  Pulse 93  Temp(Src) 98.7 F (37.1 C) (Oral)  Resp 18  Ht 5\' 9"  (1.753 m)  Wt 271 lb 2.7 oz (123 kg)  BMI 40.03 kg/m2  SpO2 95%  Physical Exam patient awake, alert. Chest clear  to auscultation bilaterally. Abdomen obese, positive bowel sounds, nontender ;  Extremities with full range of motion and no significant edema.  Imaging: Dg Chest 2 View  03/31/2015   CLINICAL DATA:  Generalized weakness and nausea following cholecystectomy  EXAM: CHEST  2 VIEW  COMPARISON:  03/10/2015  FINDINGS: Cardiac shadow is stable. Mild bibasilar atelectatic changes are seen. No focal effusion is noted. No acute  bony abnormality is seen. No free air is noted within the abdomen.  IMPRESSION: Mild bibasilar atelectasis.  No acute abnormality is noted.   Electronically Signed   By: Inez Catalina M.D.   On: 03/31/2015 19:45   Ct Abdomen Pelvis W Contrast  03/31/2015   CLINICAL DATA:  Intermittent diarrhea. Persistent nausea since gallbladder surgery. History hernia repair.  EXAM: CT ABDOMEN AND PELVIS WITH CONTRAST  TECHNIQUE: Multidetector CT imaging of the abdomen and pelvis was performed using the standard protocol following bolus administration of intravenous contrast.  CONTRAST:  164mL OMNIPAQUE IOHEXOL 300 MG/ML  SOLN  COMPARISON:  CT abdomen pelvis - 03/06/2015; ERCP - 03/10/2015; 03/08/2015  FINDINGS: Normal hepatic contour. No discrete hepatic lesions. The patient has undergone interval cholecystectomy with placement of a biliary stent within the CBD. There is a minimal amount of expected pneumobilia within the nondependent portion of the liver.  There is a an ill-defined approximately 4.3 x 2.1 x 2.0 cm hypo attenuating fluid collection within the right lobe of the liver adjacent to the gallbladder fossa (coronal image 43, series 4) which is noted to contain several foci of subcutaneous emphysema. There is a minimal amount of stranding about the gallbladder fossa. No additional discrete hepatic lesions. No ascites.  There is symmetric enhancement and excretion of the bilateral kidneys. Note is made of an approximately 1.3 cm hypo attenuating partially exophytic cyst arising from the interpolar aspect of the right kidney (46, series 3). Additional bilateral subcentimeter hypoattenuating lesions too small likely characterize of favored to represent additional renal cyst cysts. No definite renal stones in this postcontrast examination. There is a minimal amount of grossly symmetric likely body habitus related perinephric stranding. No urinary obstruction. Normal appearance of the bilateral adrenal glands, pancreas and  spleen.  Ingested enteric contrast extends to the level of the proximal ascending colon. There is gas distention of the rectum. The bowel is normal in course and caliber without wall thickening or evidence of obstruction. Normal appearance of the appendix. No pneumoperitoneum, pneumatosis or portal venous gas. Small hiatal hernia.  Scattered atherosclerotic plaque within a normal caliber abdominal aorta. The major branch vessels of the abdominal aorta appear patent on this non CTA examination. Scattered retroperitoneal lymph nodes individually not enlarged by size criteria including index periaortic lymph node at the level of the diaphragmatic hiatus, measuring approximately 0.7 cm in diameter. No retroperitoneal, mesenteric, pelvic or inguinal lymphadenopathy.  Normal appearance of the pelvic organs. Normal appearance of the urinary bladder given degree distention. No free fluid in the pelvic cul-de-sac.  Limited visualization of the lower thorax demonstrates scatter nodule within the imaged bilateral lower lobes with dominant nodule within the left lower lobe measuring 8 mm in diameter (image 16, series 7 with additional 6 mm pulmonary nodules seen within the right lower lobe (image 5, series 7) and the left lower lobe (images 15 and 17). There is minimal bibasilar subsegmental atelectasis. No focal airspace opacities. No pleural effusion.  Normal heart size. Coronary artery calcifications. No pericardial effusion.  No acute or aggressive osseous abnormalities. Moderate multilevel lumbar spine  DDD, worse at L3-L4 and L4-L5 with disc space height loss, endplate irregularity and small posteriorly directed disc osteophyte complexes at these locations. Mild scoliotic curvature of the thoracolumbar spine, convex to the left. Stigmata of dish within the caudal aspect of the thoracic spine. Mild degenerative change of the bilateral hips, right greater than left.  Post mesh repair of the right lateral ventral abdominal  wall. Unchanged small mesenteric fat containing periumbilical hernia. Unchanged small bilateral mesenteric fat containing inguinal hernias, left greater than right.  IMPRESSION: 1. Post cholecystectomy and placement of a biliary stent. 2. Interval development of an ill-defined approximately 4.3 cm mixed air and fluid containing structure within the right lobe of the liver adjacent to the gallbladder fossa - nonspecific and while potentially postoperative in etiology, a developing poorly defined hepatic abscess could have a similar appearance. Clinical correlation is advised. 3. Unchanged pulmonary nodules within the imaged bilateral lower lobes, the largest of which within the left lower lobe measures 8 mm in diameter. If the patient is at high risk for bronchogenic carcinoma, follow-up chest CT at 3-72months is recommended. If the patient is at low risk for bronchogenic carcinoma, follow-up chest CT at 6-12 months is recommended. This recommendation follows the consensus statement: Guidelines for Management of Small Pulmonary Nodules Detected on CT Scans: A Statement from the [employer] as published in Radiology 2005; 237:395-400.   Electronically Signed   By: Sandi Mariscal M.D.   On: 03/31/2015 22:48    Labs:  CBC:  Recent Labs  03/13/15 0330 03/14/15 0345 03/31/15 1715 04/01/15 0454  WBC 15.9* 11.7* 8.0 5.9  HGB 9.8* 10.1* 12.5* 11.5*  HCT 28.5* 30.1* 39.1 35.7*  PLT 307 347 397 335    COAGS:  Recent Labs  03/07/15 0514 03/31/15 1935 04/01/15 0454  INR 1.26 1.06 1.12    BMP:  Recent Labs  03/16/15 0515 03/17/15 0535 03/31/15 1715 04/01/15 0454  NA 139 137 136 136  K 3.6 3.3* 4.2 3.5  CL 104 105 101 100*  CO2 26 24 22 25   GLUCOSE 150* 162* 154* 190*  BUN 13 14 14 12   CALCIUM 8.2* 8.2* 9.5 9.1  CREATININE 0.73 0.83 0.79 0.84  GFRNONAA >60 >60 >60 >60  GFRAA >60 >60 >60 >60    LIVER FUNCTION TESTS:  Recent Labs  03/16/15 0515 03/17/15 0535 03/31/15 1715  04/01/15 0454  BILITOT 3.3* 2.2* 1.6* 1.5*  AST 84* 84* 47* 41  ALT 103* 106* 46 42  ALKPHOS 248* 222* 187* 167*  PROT 6.1* 5.7* 7.4 6.7  ALBUMIN 2.2* 2.1* 3.4* 3.2*    Assessment and Plan:  Patient status post cholecystectomy on 03/12/15 as well as prior common bile duct stent secondary to distal stricture ( choledocholithiasis). He was recently admitted with generalized weakness,  nausea, poor appetite , diarrhea.  Patient has a common bile duct stent in for distal stricture. Recent CT demonstrated a 4.3 cm mixed air-fluid collection within the right lower liver adjacent to the gallbladder fossa concerning for possible abscess.  Imaging studies have been reviewed by Dr. Annamaria Boots.  Plan is for CT-guided aspiration and possible drainage of the gallbladder fossa fluid collection today. Details/risks of procedure, including but not limited to internal bleeding, sepsis, injury to adjacent organs, discussed with patient and wife with her understanding and consent.    Signed: D. Rowe Robert 04/01/2015, 10:01 AM   I spent a total of 15 minutes in face to face in clinical consultation/evaluation, greater than 50% of which  was counseling/coordinating care for  CT-guided aspiration/possible drainage of gallbladder fossa fluid collection.

## 2015-04-01 NOTE — Progress Notes (Signed)
Initial Nutrition Assessment  DOCUMENTATION CODES:  Non-severe (moderate) malnutrition in context of acute illness/injury, Morbid obesity  INTERVENTION:  Provide Glucerna Shake po TID, each supplement provides 220 kcal and 10 grams of protein Encourage PO intake RD to continue to monitor  NUTRITION DIAGNOSIS:  Increased nutrient needs related to wound healing as evidenced by estimated needs.  GOAL:  Patient will meet greater than or equal to 90% of their needs  MONITOR:  PO intake, Supplement acceptance, Labs, Weight trends, Skin, I & O's  REASON FOR ASSESSMENT:  Malnutrition Screening Tool    ASSESSMENT: 69 year old male with past medical history of hypertension, diabetes, dyslipidemia, recent hospitalization for sepsis secondary to Escherichia coli bacteremia secondary to cholangitis and ischemic/necrotic cholecystitis, status post CBD stent in for distal stricture and subsequently status post cholecystectomy on 03/12/2015.  Pt reports poor appetite related to taste changes. Pt reports "nothing tastes good anymore". Pt does drink Glucerna Shakes at home, RD to order. Pt with 15 lb weight loss since 5/07 (5% weight loss x 1 month, significant for time frame).  Nutrition focused physical exam shows no sign of depletion of muscle mass or body fat.  Labs reviewed.  Height:  Ht Readings from Last 1 Encounters:  04/01/15 5\' 9"  (1.753 m)    Weight:  Wt Readings from Last 1 Encounters:  04/01/15 271 lb 2.7 oz (123 kg)    Ideal Body Weight:  70 kg  Wt Readings from Last 10 Encounters:  04/01/15 271 lb 2.7 oz (123 kg)  03/14/15 286 lb 6 oz (129.9 kg)    BMI:  Body mass index is 40.03 kg/(m^2).  Estimated Nutritional Needs:  Kcal:  2000-2200  Protein:  85-95g  Fluid:  2L/day     Skin:  Reviewed, no issues  Diet Order:  Diet Heart Room service appropriate?: Yes; Fluid consistency:: Thin  EDUCATION NEEDS:  No education needs identified at this  time   Intake/Output Summary (Last 24 hours) at 04/01/15 1710 Last data filed at 04/01/15 1517  Gross per 24 hour  Intake 1181.67 ml  Output   1100 ml  Net  81.67 ml    Last BM:  5/23  Clayton Bibles, MS, RD, LDN Pager: [phone] After Hours Pager: [phone]

## 2015-04-01 NOTE — Progress Notes (Signed)
Patient ID: Justin Cohen, male   DOB: 11/09/1945, 69 y.o.   MRN: [id number] [facility] PROGRESS NOTE  Justin Cohen:[id number] DOB: [birth year] DOA: 03/31/2015 PCP: Gara Kroner, MD  Brief narrative:    69 year old male with past medical history of hypertension, diabetes, dyslipidemia, recent hospitalization for sepsis secondary to Escherichia coli bacteremia secondary to cholangitis and ischemic/necrotic cholecystitis, status post CBD stent in for distal stricture and subsequently status post cholecystectomy on 03/12/2015. Patient presented to [facility] with reports of not feeling well since past hospitalization. He has had a diarrhea at home but no diarrhea during this hospital stay. CT abdomen on this admission showed a fluid collection and possible abscess within the right lobe of the liver adjacent to the gallbladder fossa. Patient was started on Zosyn and Flagyl on the admission. GI has seen the patient in consultation. Plan is for CT-guided aspiration and possible drainage of gallbladder fossa fluid collection by interventional radiology.  Barrier to discharge: As mentioned, plan for CT-guided aspiration of fluid collection in the area of gallbladder fossa. Patient is on Zosyn and Flagyl.   Assessment/Plan:    Principal problem: Fluid collection in the right lower lobe of liver / possible hepatic abscess - CT abdomen on this admission showed a fluid collection and possible abscess formation within the right lobe of the liver adjacent to the gallbladder fossa. - Patient has been seen by GI in consultation. - Plan is for CT-guided aspiration and possible drainage of gallbladder fossa fluid collection by interventional radiology today. - Continue current antibodies, Zosyn and Flagyl. - Patient is currently on clear liquid diet.  Active Problems: Essential hypertension - Continue Norvasc 5 mg twice daily, Lotensin 10 mg twice daily  Pulmonary nodules in  bilateral lower lobes - Seen on CT scan. The largest in the left lower lobe about 8 mm in diameter. If the patient is at high risk for bronchogenic carcinoma follow-up CT chest is recommended at 3-6 months otherwise repeat scan in 6-12 months.  DM w/o complication type II - Continue metformin thousand milligrams twice daily, actos 45 mg daily and glipizide 5 mg daily  Dyslipidemia - Patient is on omega-3 supplementation and crestor. Will hold off on these supplements while patient in hospital due to liver related issues.   DVT Prophylaxis  - SCD's bilaterally   Code Status: Full.  Family Communication:  plan of care discussed with the patient and his sister in law at the bedside  Disposition Plan: Home possibly by 5/26 or 5/27 if ok clinically and if blood work ok.   IV access:  Peripheral IV  Procedures and diagnostic studies:    Dg Chest 2 View 03/31/2015  Mild bibasilar atelectasis.  No acute abnormality is noted.   Electronically Signed   By: Inez Catalina M.D.   On: 03/31/2015 19:45   Ct Abdomen Pelvis W Contrast 03/31/2015   1. Post cholecystectomy and placement of a biliary stent. 2. Interval development of an ill-defined approximately 4.3 cm mixed air and fluid containing structure within the right lobe of the liver adjacent to the gallbladder fossa - nonspecific and while potentially postoperative in etiology, a developing poorly defined hepatic abscess could have a similar appearance. Clinical correlation is advised. 3. Unchanged pulmonary nodules within the imaged bilateral lower lobes, the largest of which within the left lower lobe measures 8 mm in diameter. If the patient is at high risk for bronchogenic carcinoma, follow-up chest CT at 3-27months is recommended. If the patient  is at low risk for bronchogenic carcinoma, follow-up chest CT at 6-12 months is recommended. This recommendation follows the consensus statement: Guidelines for Management of Small Pulmonary Nodules Detected  on CT Scans: A Statement from the [employer] as published in Radiology 2005; 237:395-400.   Electronically Signed   By: Sandi Mariscal M.D.   On: 03/31/2015 22:48    Medical Consultants:  Gastroenterology, Dr. Clarene Essex Interventional radiology  Other Consultants:  None   IAnti-Infectives:   Zosyn 03/31/2015 --> Flagyl 03/31/2015 -->    Leisa Lenz, MD  [facility] Pager [phone]  Time spent in minutes: 25 minutes  If 7PM-7AM, please contact night-coverage [email or web address] Password [facility] 04/01/2015, 11:24 AM   LOS: 1 day    HPI/Subjective: No acute overnight events. Patient reports feeling better this am. No nausea or vomiting.   Objective: Filed Vitals:   04/01/15 1032 04/01/15 1038 04/01/15 1040 04/01/15 1100  BP: 124/62 122/63 112/56 125/67  Pulse: 84 83 85 86  Temp:    98.6 F (37 C)  TempSrc:    Oral  Resp: 20 14 17 16   Height:      Weight:      SpO2: 94% 96% 99%     Intake/Output Summary (Last 24 hours) at 04/01/15 1124 Last data filed at 04/01/15 0900  Gross per 24 hour  Intake 556.67 ml  Output    850 ml  Net -293.33 ml    Exam:   General:  Pt is alert, follows commands appropriately, not in acute distress  Cardiovascular: Regular rate and rhythm, S1/S2, no murmurs  Respiratory: Clear to auscultation bilaterally, no wheezing, no crackles, no rhonchi  Abdomen: Soft, non tender, non distended, bowel sounds present  Extremities: No edema, pulses DP and PT palpable bilaterally  Neuro: Grossly nonfocal  Data Reviewed: Basic Metabolic Panel:  Recent Labs Lab 03/31/15 1715 04/01/15 0454  NA 136 136  K 4.2 3.5  CL 101 100*  CO2 22 25  GLUCOSE 154* 190*  BUN 14 12  CREATININE 0.79 0.84  CALCIUM 9.5 9.1   Liver Function Tests:  Recent Labs Lab 03/31/15 1715 04/01/15 0454  AST 47* 41  ALT 46 42  ALKPHOS 187* 167*  BILITOT 1.6* 1.5*  PROT 7.4 6.7  ALBUMIN 3.4* 3.2*    Recent Labs Lab 03/31/15 1715  LIPASE 19*    No results for input(s): AMMONIA in the last 168 hours. CBC:  Recent Labs Lab 03/31/15 1715 04/01/15 0454  WBC 8.0 5.9  NEUTROABS 4.9  --   HGB 12.5* 11.5*  HCT 39.1 35.7*  MCV 87.5 87.3  PLT 397 335   Cardiac Enzymes:  Recent Labs Lab 03/31/15 1935  TROPONINI <0.03   BNP: Invalid input(s): POCBNP CBG:  Recent Labs Lab 04/01/15 0251 04/01/15 0847  GLUCAP 131* 168*    No results found for this or any previous visit (from the past 240 hour(s)).   Scheduled Meds: . amLODipine  5 mg Oral BID  . benazepril  10 mg Oral BID  . fentaNYL      . folic acid  1 mg Oral Daily  . glipiZIDE  5 mg Oral Q breakfast  . insulin aspart  0-15 Units Subcutaneous TID WC  . metFORMIN  1,000 mg Oral BID  . metronidazole  500 mg Intravenous Q8H  . multivitamin with min  1 tablet Oral Daily  . pantoprazole  20 mg Oral Daily  . pioglitazone  45 mg Oral Daily  . piperacillin-tazobactam (  3.375 g Intravenous Q8H  . thiamine  100 mg Oral Daily  . venlafaxine XR  75 mg Oral Daily   Continuous Infusions: . sodium chloride 50 mL/hr (04/01/15 0240)

## 2015-04-01 NOTE — Procedures (Signed)
Successful CT guided drain of GB fossa fluid collection Blood tinged fld aspirated. GS CX sent No comp Keep to suction bulb Full report in pacs

## 2015-04-01 NOTE — Progress Notes (Signed)
Subjective: Pt hospitalized 4/29-5/10/16 with Sepsis and what turned out to be and ischemic/necrotic gallbladder.  He had a very difficult Laparoscopic Cholecystectomy with drain placement, on 03/12/15.  This was preceded by ERCP on 03/08/15, Dr. Deatra Ina and ERCP with sphincterotomy/papillotomy, ERCP with removal of calculus/calculi , ERCP with stent placement, and ERCP with balloon dilation,03/10/15  for rising bilirubins, by Dr. Watt Climes. Pt discharged home on 03/17/15.  He was seen in clinic and drain removed on 03/26/15, by Dr. Rosendo Gros. Now back in the ED with ongoing weakness, nausea, and diarrhea.  Poor PO intake, had to call 911 to get him out of a bathtub last week.  He says he has not had a good day since he went home taste is off, nothing taste good, he has no appetite and mostly wants to sit in the chair at home and do nothing.  Just restarted PT for his knee surgery that preceded his acute hospitalization for his gallbladder. Work up in the ED shows CMP much improved, WBC is normal, H/H is down some.  CT scan shows: post cholecystectomy and CBD stent placement.  Interval development of an ill-defined approximately 4.3 cm mixed air and fluid containing structure within the right lobe of the liver adjacent to the gallbladder fossa - nonspecific and while potentially postoperative in etiology.  He has already been to IR with drain placement and blood tinged fluid aspirated.  We are ask to see.  Objective: Vital signs in last 24 hours: Temp:  [97.8 F (36.6 C)-98.7 F (37.1 C)] 98.7 F (37.1 C) (05/25 0532) Pulse Rate:  [83-100] 84 (05/25 1029) Resp:  [18-22] 22 (05/25 1029) BP: (124-164)/(57-100) 124/57 mmHg (05/25 1029) SpO2:  [95 %-98 %] 98 % (05/25 1029) Weight:  [114.805 kg (253 lb 1.6 oz)-123 kg (271 lb 2.7 oz)] 123 kg (271 lb 2.7 oz) (05/25 0535) Last BM Date: 03/30/15 Afebrile, VSS  Labs OK, LFT's almost normal now, WBC is normal. CT above Intake/Output from previous day: 05/24 0701 -  05/25 0700 In: 436.7 [P.O.:240; I.V.:146.7; IV Piggyback:50] Out: 650 [Urine:650] Intake/Output this shift: Total I/O In: 120 [P.O.:120] Out: 200 [Urine:200]  General appearance: alert, cooperative and no distress Resp: clear to auscultation bilaterally Cardio: regular rate and rhythm, S1, S2 normal, no murmur, click, rub or gallop GI: soft, non-tender; bowel sounds normal; no masses,  no organomegaly and drain in place looks like just serous fluid, it is clear.  No tenderness except at the drain site, ports look good, I took off last of the steri strips,. Extremities: extremities normal, atraumatic, no cyanosis or edema and knee surgical site looks good.  Lab Results:   Recent Labs  03/31/15 1715 04/01/15 0454  WBC 8.0 5.9  HGB 12.5* 11.5*  HCT 39.1 35.7*  PLT 397 335    BMET  Recent Labs  03/31/15 1715 04/01/15 0454  NA 136 136  K 4.2 3.5  CL 101 100*  CO2 22 25  GLUCOSE 154* 190*  BUN 14 12  CREATININE 0.79 0.84  CALCIUM 9.5 9.1   PT/INR  Recent Labs  03/31/15 1935 04/01/15 0454  LABPROT 14.0 14.6  INR 1.06 1.12     Recent Labs Lab 03/31/15 1715 04/01/15 0454  AST 47* 41  ALT 46 42  ALKPHOS 187* 167*  BILITOT 1.6* 1.5*  PROT 7.4 6.7  ALBUMIN 3.4* 3.2*     Lipase     Component Value Date/Time   LIPASE 19* 03/31/2015 1715     Studies/Results: Dg Chest  2 View  03/31/2015   CLINICAL DATA:  Generalized weakness and nausea following cholecystectomy  EXAM: CHEST  2 VIEW  COMPARISON:  03/10/2015  FINDINGS: Cardiac shadow is stable. Mild bibasilar atelectatic changes are seen. No focal effusion is noted. No acute bony abnormality is seen. No free air is noted within the abdomen.  IMPRESSION: Mild bibasilar atelectasis.  No acute abnormality is noted.   Electronically Signed   By: Inez Catalina M.D.   On: 03/31/2015 19:45   Ct Abdomen Pelvis W Contrast  03/31/2015   CLINICAL DATA:  Intermittent diarrhea. Persistent nausea since gallbladder surgery.  History hernia repair.  EXAM: CT ABDOMEN AND PELVIS WITH CONTRAST  TECHNIQUE: Multidetector CT imaging of the abdomen and pelvis was performed using the standard protocol following bolus administration of intravenous contrast.  CONTRAST:  151mL OMNIPAQUE IOHEXOL 300 MG/ML  SOLN  COMPARISON:  CT abdomen pelvis - 03/06/2015; ERCP - 03/10/2015; 03/08/2015  FINDINGS: Normal hepatic contour. No discrete hepatic lesions. The patient has undergone interval cholecystectomy with placement of a biliary stent within the CBD. There is a minimal amount of expected pneumobilia within the nondependent portion of the liver.  There is a an ill-defined approximately 4.3 x 2.1 x 2.0 cm hypo attenuating fluid collection within the right lobe of the liver adjacent to the gallbladder fossa (coronal image 43, series 4) which is noted to contain several foci of subcutaneous emphysema. There is a minimal amount of stranding about the gallbladder fossa. No additional discrete hepatic lesions. No ascites.  There is symmetric enhancement and excretion of the bilateral kidneys. Note is made of an approximately 1.3 cm hypo attenuating partially exophytic cyst arising from the interpolar aspect of the right kidney (46, series 3). Additional bilateral subcentimeter hypoattenuating lesions too small likely characterize of favored to represent additional renal cyst cysts. No definite renal stones in this postcontrast examination. There is a minimal amount of grossly symmetric likely body habitus related perinephric stranding. No urinary obstruction. Normal appearance of the bilateral adrenal glands, pancreas and spleen.  Ingested enteric contrast extends to the level of the proximal ascending colon. There is gas distention of the rectum. The bowel is normal in course and caliber without wall thickening or evidence of obstruction. Normal appearance of the appendix. No pneumoperitoneum, pneumatosis or portal venous gas. Small hiatal hernia.  Scattered  atherosclerotic plaque within a normal caliber abdominal aorta. The major branch vessels of the abdominal aorta appear patent on this non CTA examination. Scattered retroperitoneal lymph nodes individually not enlarged by size criteria including index periaortic lymph node at the level of the diaphragmatic hiatus, measuring approximately 0.7 cm in diameter. No retroperitoneal, mesenteric, pelvic or inguinal lymphadenopathy.  Normal appearance of the pelvic organs. Normal appearance of the urinary bladder given degree distention. No free fluid in the pelvic cul-de-sac.  Limited visualization of the lower thorax demonstrates scatter nodule within the imaged bilateral lower lobes with dominant nodule within the left lower lobe measuring 8 mm in diameter (image 16, series 7 with additional 6 mm pulmonary nodules seen within the right lower lobe (image 5, series 7) and the left lower lobe (images 15 and 17). There is minimal bibasilar subsegmental atelectasis. No focal airspace opacities. No pleural effusion.  Normal heart size. Coronary artery calcifications. No pericardial effusion.  No acute or aggressive osseous abnormalities. Moderate multilevel lumbar spine DDD, worse at L3-L4 and L4-L5 with disc space height loss, endplate irregularity and small posteriorly directed disc osteophyte complexes at these locations. Mild scoliotic  curvature of the thoracolumbar spine, convex to the left. Stigmata of dish within the caudal aspect of the thoracic spine. Mild degenerative change of the bilateral hips, right greater than left.  Post mesh repair of the right lateral ventral abdominal wall. Unchanged small mesenteric fat containing periumbilical hernia. Unchanged small bilateral mesenteric fat containing inguinal hernias, left greater than right.  IMPRESSION: 1. Post cholecystectomy and placement of a biliary stent. 2. Interval development of an ill-defined approximately 4.3 cm mixed air and fluid containing structure within  the right lobe of the liver adjacent to the gallbladder fossa - nonspecific and while potentially postoperative in etiology, a developing poorly defined hepatic abscess could have a similar appearance. Clinical correlation is advised. 3. Unchanged pulmonary nodules within the imaged bilateral lower lobes, the largest of which within the left lower lobe measures 8 mm in diameter. If the patient is at high risk for bronchogenic carcinoma, follow-up chest CT at 3-87months is recommended. If the patient is at low risk for bronchogenic carcinoma, follow-up chest CT at 6-12 months is recommended. This recommendation follows the consensus statement: Guidelines for Management of Small Pulmonary Nodules Detected on CT Scans: A Statement from the [employer] as published in Radiology 2005; 237:395-400.   Electronically Signed   By: Sandi Mariscal M.D.   On: 03/31/2015 22:48    Medications: . amLODipine  5 mg Oral BID   And  . benazepril  10 mg Oral BID  . dorzolamide-timolol  1 drop Both Eyes BID  . fentaNYL      . flumazenil      . folic acid  1 mg Oral Daily  . glipiZIDE  5 mg Oral Q breakfast  . insulin aspart  0-15 Units Subcutaneous TID WC  . metFORMIN  1,000 mg Oral BID  . metronidazole  500 mg Intravenous Q8H  . midazolam      . multivitamin with minerals  1 tablet Oral Daily  . naloxone      . pantoprazole  20 mg Oral Daily  . pioglitazone  45 mg Oral Daily  . piperacillin-tazobactam (ZOSYN)  IV  3.375 g Intravenous Q8H  . thiamine  100 mg Oral Daily  . venlafaxine XR  75 mg Oral Daily    Assessment/Plan Weakness, nausea and diarrhea  (Hospitalized 03/06/15-03/17/15) S/p sepsis, cholangitis, cholelithiasis, choledocholithiasis, with ischemic/necrotic gallbladder, laparoscopic cholecystectomy, ERCP x 2 with stent placement, choledocholithiasis with calculi removal 03/10/15.  S/p ERCP 03/08/15 Dr. Deatra Ina  ERCP with sphincterotomy/papillotomy, ERCP with removal of calculus/calculi , ERCP with  stent placement, and ERCP with balloon dilation,03/10/15, Dr. Clarene Essex S/p left Knee replacement, 02/17/15  Hx of prostate cancer AODM Sleep Apnea on CPAP  LFT's elevation with T bil to 11., now resolving Body mass index is 40.03 kg/(m^2).  130.8 KG last admit, 123 KG this admit  (19.5 pound loss since last admit)  Plan:  Currently the fluid I see coming from the drain does not look infected or like bile. I agree with antibiotics, and will follow with you, await cultures.  I have added Lovenox, SCD, and PT to assist with knee.      LOS: 1 day    Justin Cohen 04/01/2015

## 2015-04-01 NOTE — Progress Notes (Signed)
Joyce Copa 9:16 AM  Subjective: Patient known to me from previous admission and did have a rotten gallbladder and has a CBD stent in for distal stricture and says he just hasn't felt that well at home with episodic diarrhea and some noises and cramps but no significant pain or nausea and no fever chills or night sweats and his liver tests are almost back to normal but a CT showed a fluid collection and possible abscess and his case was discussed with the surgical team and interventional radiology as well as the patient and his family and he has no other complaints  Objective: Vital signs stable afebrile no acute distress lungs are clear regular rate and rhythm abdomen is actually soft nontender occasional bowel sounds liver test improved white count okay CT reviewed as above  Assessment: Difficult cholecystectomy and CBD stricture now with fluid collection  Plan: I discussed his case with interventional radiology who will aspirate it  and possibly even place a drain later today and agree with Antubiotics and pending those findings might need a HIDA scan to rule out a leak particularly if drainage continues and is mostly billus and when this resolves probably as an outpatient will proceed with repeat ERCP and stent removal and reevaluate the distal stricture with either dilation spyglass etc.  [facility]  Pager [phone] After 5PM or if no answer call [phone]

## 2015-04-02 DIAGNOSIS — E440 Moderate protein-calorie malnutrition: Secondary | ICD-10-CM

## 2015-04-02 LAB — HEMOGLOBIN A1C
HEMOGLOBIN A1C: 6.8 % — AB (ref 4.8–5.6)
MEAN PLASMA GLUCOSE: 148 mg/dL

## 2015-04-02 LAB — GLUCOSE, CAPILLARY: Glucose-Capillary: 145 mg/dL — ABNORMAL HIGH (ref 65–99)

## 2015-04-02 MED ORDER — METRONIDAZOLE 500 MG PO TABS: 500.0000 mg | ORAL_TABLET | Freq: Three times a day (TID) | ORAL | Status: DC

## 2015-04-02 MED ORDER — SACCHAROMYCES BOULARDII 250 MG PO CAPS: 250.0000 mg | ORAL_CAPSULE | Freq: Two times a day (BID) | ORAL | Status: DC

## 2015-04-02 MED ORDER — AMOXICILLIN-POT CLAVULANATE 875-125 MG PO TABS: 1.0000 | ORAL_TABLET | Freq: Two times a day (BID) | ORAL | Status: DC

## 2015-04-02 MED ORDER — SACCHAROMYCES BOULARDII 250 MG PO CAPS
250.0000 mg | ORAL_CAPSULE | Freq: Two times a day (BID) | ORAL | Status: DC
  Administered 2015-04-02: 250 mg via ORAL
  Filled 2015-04-02 (×2): qty 1

## 2015-04-02 NOTE — Care Management Note (Signed)
Case Management Note  Patient Details  Name: ATHANASIUS KESLING MRN: [id number] Date of Birth: January 20, 1946  Subjective/Objective:             Admitted with Fluid collection in the right lower lobe of liver / possible hepatic abscess      Action/Plan: Discharge planning, patient previously active with [facility] and wants to use them again for [facility] services, contacted [facility] to assist with drain care   Expected Discharge Date:  04/03/15               Expected Discharge Plan:  [place]  In-House Referral:  NA  Discharge planning Services  CM Consult  Post Acute Care Choice:    Choice offered to:  Patient  DME Arranged:    DME Agency:     HH Arranged:  RN [place] Agency:  [facility] [facility]  Status of Service:  Completed, signed off  Medicare Important Message Given:  N/A - LOS <3 / Initial given by admissions Date Medicare IM Given:    Medicare IM give by:    Date Additional Medicare IM Given:    Additional Medicare Important Message give by:     If discussed at [place] of Stay Meetings, dates discussed:    Additional Comments:  Guadalupe Maple, RN 04/02/2015, 11:15 AM

## 2015-04-02 NOTE — Progress Notes (Signed)
Subjective: He feels 100% better, food last PM tasted good, no pain, no diarrhea, normal stool.  He thinks it's the drain, but I have told him I don't think the normal fluid from the drain fixed all of this.    Objective: Vital signs in last 24 hours: Temp:  [97.3 F (36.3 C)-98.7 F (37.1 C)] 98 F (36.7 C) (05/26 0540) Pulse Rate:  [81-89] 89 (05/26 0540) Resp:  [14-22] 18 (05/26 0540) BP: (112-151)/(56-87) 127/66 mmHg (05/26 0540) SpO2:  [90 %-99 %] 90 % (05/26 0540) Last BM Date: 03/30/15  840 PO Drains 20 ml Stool - 1 Afebrile, VSS No labs S/p CT drainage of GB fossa fluid 04/01/15 Intake/Output from previous day: 05/25 0701 - 05/26 0700 In: 2420 [P.O.:840; I.V.:1220; IV Piggyback:350] Out: 39 [Urine:1000; Drains:20] Intake/Output this shift:    General appearance: alert, cooperative, no distress and feels really good Resp: clear to auscultation bilaterally GI: soft, non-tender; bowel sounds normal; no masses,  no organomegaly and drain fluid is clear serous.  Lab Results:   Recent Labs  03/31/15 1715 04/01/15 0454  WBC 8.0 5.9  HGB 12.5* 11.5*  HCT 39.1 35.7*  PLT 397 335    BMET  Recent Labs  03/31/15 1715 04/01/15 0454  NA 136 136  K 4.2 3.5  CL 101 100*  CO2 22 25  GLUCOSE 154* 190*  BUN 14 12  CREATININE 0.79 0.84  CALCIUM 9.5 9.1   PT/INR  Recent Labs  03/31/15 1935 04/01/15 0454  LABPROT 14.0 14.6  INR 1.06 1.12     Recent Labs Lab 03/31/15 1715 04/01/15 0454  AST 47* 41  ALT 46 42  ALKPHOS 187* 167*  BILITOT 1.6* 1.5*  PROT 7.4 6.7  ALBUMIN 3.4* 3.2*     Lipase     Component Value Date/Time   LIPASE 19* 03/31/2015 1715     Studies/Results: Dg Chest 2 View  03/31/2015   CLINICAL DATA:  Generalized weakness and nausea following cholecystectomy  EXAM: CHEST  2 VIEW  COMPARISON:  03/10/2015  FINDINGS: Cardiac shadow is stable. Mild bibasilar atelectatic changes are seen. No focal effusion is noted. No acute bony  abnormality is seen. No free air is noted within the abdomen.  IMPRESSION: Mild bibasilar atelectasis.  No acute abnormality is noted.   Electronically Signed   By: Inez Catalina M.D.   On: 03/31/2015 19:45   Ct Abdomen Pelvis W Contrast  03/31/2015   CLINICAL DATA:  Intermittent diarrhea. Persistent nausea since gallbladder surgery. History hernia repair.  EXAM: CT ABDOMEN AND PELVIS WITH CONTRAST  TECHNIQUE: Multidetector CT imaging of the abdomen and pelvis was performed using the standard protocol following bolus administration of intravenous contrast.  CONTRAST:  112mL OMNIPAQUE IOHEXOL 300 MG/ML  SOLN  COMPARISON:  CT abdomen pelvis - 03/06/2015; ERCP - 03/10/2015; 03/08/2015  FINDINGS: Normal hepatic contour. No discrete hepatic lesions. The patient has undergone interval cholecystectomy with placement of a biliary stent within the CBD. There is a minimal amount of expected pneumobilia within the nondependent portion of the liver.  There is a an ill-defined approximately 4.3 x 2.1 x 2.0 cm hypo attenuating fluid collection within the right lobe of the liver adjacent to the gallbladder fossa (coronal image 43, series 4) which is noted to contain several foci of subcutaneous emphysema. There is a minimal amount of stranding about the gallbladder fossa. No additional discrete hepatic lesions. No ascites.  There is symmetric enhancement and excretion of the bilateral kidneys. Note is  made of an approximately 1.3 cm hypo attenuating partially exophytic cyst arising from the interpolar aspect of the right kidney (46, series 3). Additional bilateral subcentimeter hypoattenuating lesions too small likely characterize of favored to represent additional renal cyst cysts. No definite renal stones in this postcontrast examination. There is a minimal amount of grossly symmetric likely body habitus related perinephric stranding. No urinary obstruction. Normal appearance of the bilateral adrenal glands, pancreas and  spleen.  Ingested enteric contrast extends to the level of the proximal ascending colon. There is gas distention of the rectum. The bowel is normal in course and caliber without wall thickening or evidence of obstruction. Normal appearance of the appendix. No pneumoperitoneum, pneumatosis or portal venous gas. Small hiatal hernia.  Scattered atherosclerotic plaque within a normal caliber abdominal aorta. The major branch vessels of the abdominal aorta appear patent on this non CTA examination. Scattered retroperitoneal lymph nodes individually not enlarged by size criteria including index periaortic lymph node at the level of the diaphragmatic hiatus, measuring approximately 0.7 cm in diameter. No retroperitoneal, mesenteric, pelvic or inguinal lymphadenopathy.  Normal appearance of the pelvic organs. Normal appearance of the urinary bladder given degree distention. No free fluid in the pelvic cul-de-sac.  Limited visualization of the lower thorax demonstrates scatter nodule within the imaged bilateral lower lobes with dominant nodule within the left lower lobe measuring 8 mm in diameter (image 16, series 7 with additional 6 mm pulmonary nodules seen within the right lower lobe (image 5, series 7) and the left lower lobe (images 15 and 17). There is minimal bibasilar subsegmental atelectasis. No focal airspace opacities. No pleural effusion.  Normal heart size. Coronary artery calcifications. No pericardial effusion.  No acute or aggressive osseous abnormalities. Moderate multilevel lumbar spine DDD, worse at L3-L4 and L4-L5 with disc space height loss, endplate irregularity and small posteriorly directed disc osteophyte complexes at these locations. Mild scoliotic curvature of the thoracolumbar spine, convex to the left. Stigmata of dish within the caudal aspect of the thoracic spine. Mild degenerative change of the bilateral hips, right greater than left.  Post mesh repair of the right lateral ventral abdominal  wall. Unchanged small mesenteric fat containing periumbilical hernia. Unchanged small bilateral mesenteric fat containing inguinal hernias, left greater than right.  IMPRESSION: 1. Post cholecystectomy and placement of a biliary stent. 2. Interval development of an ill-defined approximately 4.3 cm mixed air and fluid containing structure within the right lobe of the liver adjacent to the gallbladder fossa - nonspecific and while potentially postoperative in etiology, a developing poorly defined hepatic abscess could have a similar appearance. Clinical correlation is advised. 3. Unchanged pulmonary nodules within the imaged bilateral lower lobes, the largest of which within the left lower lobe measures 8 mm in diameter. If the patient is at high risk for bronchogenic carcinoma, follow-up chest CT at 3-57months is recommended. If the patient is at low risk for bronchogenic carcinoma, follow-up chest CT at 6-12 months is recommended. This recommendation follows the consensus statement: Guidelines for Management of Small Pulmonary Nodules Detected on CT Scans: A Statement from the [employer] as published in Radiology 2005; 237:395-400.   Electronically Signed   By: Sandi Mariscal M.D.   On: 03/31/2015 22:48   Ct Image Guided Drainage Percut Cath  Peritoneal Retroperit  04/01/2015   CLINICAL DATA:  Status post cholecystectomy. Small gallbladder fossa air-fluid collection. Status post endoscopic biliary stent.  EXAM: CT-GUIDED GALLBLADDER FOSSA FLUID COLLECTION DRAIN INSERTION  Date:  5/25/20165/25/2016 11:41 am  Radiologist:  Jerilynn Mages. Daryll Brod, MD  Guidance:  CT  FLUOROSCOPY TIME:  None.  MEDICATIONS AND MEDICAL HISTORY: 1 mg Versed, 50 mcg fentanyl  ANESTHESIA/SEDATION: 15 minutes  CONTRAST:  None  COMPLICATIONS: None  PROCEDURE: Informed consent was obtained from the patient following explanation of the procedure, risks, benefits and alternatives. The patient understands, agrees and consents for the procedure. All  questions were addressed. A time out was performed.  Maximal barrier sterile technique utilized including caps, mask, sterile gowns, sterile gloves, large sterile drape, hand hygiene, and Betadine.  Previous imaging reviewed. Patient positioned supine. Noncontrast localization CT performed. The small gallbladder fossa air-fluid collection was localized. Under sterile conditions and local anesthesia, an 18 gauge 15 cm access needle was advanced from a percutaneous transhepatic approach into the gallbladder fossa air-fluid collection. Needle position confirmed with CT. Syringe aspiration yielded thin blood tinged fluid. Guidewire inserted followed by tract dilatation to insert a 10 [place] drain. Retention loop formed in the gallbladder fossa fluid collection. Catheter secured with a Prolene suture. Position confirmed with CT. External suction bulb connected. No immediate complication. Patient tolerated the procedure well.  IMPRESSION: Successful CT-guided gallbladder fossa fluid collection drain insertion. Sample sent for Gram stain and culture.   Electronically Signed   By: Jerilynn Mages.  Shick M.D.   On: 04/01/2015 11:47    Medications: . amLODipine  5 mg Oral BID   And  . benazepril  10 mg Oral BID  . dorzolamide-timolol  1 drop Both Eyes BID  . enoxaparin (LOVENOX) injection  60 mg Subcutaneous Q24H  . feeding supplement (GLUCERNA SHAKE)  237 mL Oral TID BM  . glipiZIDE  5 mg Oral Q breakfast  . insulin aspart  0-15 Units Subcutaneous TID WC  . metFORMIN  1,000 mg Oral BID  . metronidazole  500 mg Intravenous Q8H  . pantoprazole  20 mg Oral Daily  . pioglitazone  45 mg Oral Daily  . piperacillin-tazobactam (ZOSYN)  IV  3.375 g Intravenous Q8H  . venlafaxine XR  75 mg Oral Daily    Assessment/Plan Weakness, nausea and diarrhea (Hospitalized 03/06/15-03/17/15) S/p sepsis, cholangitis, cholelithiasis, choledocholithiasis, with ischemic/necrotic gallbladder, laparoscopic cholecystectomy, ERCP x 2 with stent  placement, choledocholithiasis with calculi removal 03/10/15. S/p ERCP 03/08/15 Dr. Deatra Ina ERCP with sphincterotomy/papillotomy, ERCP with removal of calculus/calculi , ERCP with stent placement, and ERCP with balloon dilation,03/10/15, Dr. Clarene Essex S/p left Knee replacement, 02/17/15 Hx of prostate cancer AODM Sleep Apnea on CPAP LFT's elevation with T bil to 11., now resolving Body mass index is 40.03 kg/(m^2). 130.8 KG last admit, 123 KG this admit (19.5 pound loss since last admit)  Antibiotics:  Day 3 Zosyn/Flagyl DVT:  Lovenox/SCD  Plan: I am going to add a probiotic, he would like to go home I told him that would most likely not happen today.  Await cultures, but I doubt it will grow anything.  Nothing currently in EPIC.    LOS: 2 days    Tane Biegler 04/02/2015

## 2015-04-02 NOTE — Progress Notes (Signed)
Referring Physician(s): CCS  Subjective: Patient states he feels the best he has in over 1 month, he denies any significant abdominal pain, he is tolerating a diet with good appetite and denies any N/V. He denies any F/C.   Allergies: Review of patient's allergies indicates no known allergies.  Medications: Prior to Admission medications   Medication Sig Start Date End Date Taking? Authorizing Provider  amLODipine-benazepril (LOTREL) 5-10 MG per capsule Take 1 capsule by mouth 2 (two) times daily. 01/27/15  Yes Historical Provider, MD  aspirin 81 MG tablet Take 81 mg by mouth daily.   Yes Historical Provider, MD  cetirizine (ZYRTEC) 10 MG tablet Take 10 mg by mouth daily.   Yes Historical Provider, MD  Cholecalciferol (VITAMIN D) 2000 UNITS tablet Take 2,000 Units by mouth daily.   Yes Historical Provider, MD  dorzolamide-timolol (COSOPT) 22.3-6.8 MG/ML ophthalmic solution Place 1 drop into both eyes 2 (two) times daily.  01/16/15  Yes Historical Provider, MD  fluticasone (FLONASE) 50 MCG/ACT nasal spray Place 1 spray into both nostrils daily as needed for allergies (allergies).    Yes Historical Provider, MD  glipiZIDE (GLUCOTROL) 5 MG tablet Take 5 mg by mouth every morning. 01/21/15  Yes Historical Provider, MD  HYDROcodone-acetaminophen (NORCO) 7.5-325 MG per tablet Take 1-2 tablets by mouth every 4 (four) hours as needed for moderate pain or severe pain (pain). For moderate pain 02/18/15  Yes Historical Provider, MD  metFORMIN (GLUCOPHAGE) 1000 MG tablet Take 1,000 mg by mouth 2 (two) times daily. 02/10/15  Yes Historical Provider, MD  Multiple Vitamin (MULTIVITAMIN WITH MINERALS) TABS tablet Take 1 tablet by mouth every morning.   Yes Historical Provider, MD  Omega-3 Fatty Acids (FISH OIL) 1000 MG CAPS Take 1,000 mg by mouth every morning.   Yes Historical Provider, MD  pioglitazone (ACTOS) 45 MG tablet Take 45 mg by mouth every morning. 01/27/15  Yes Historical Provider, MD  PREVACID 15  MG capsule Take 15 mg by mouth every morning. 02/25/15  Yes Historical Provider, MD  Probiotic Product (PROBIOTIC DAILY PO) Take 1 capsule by mouth daily.   Yes Historical Provider, MD  rosuvastatin (CRESTOR) 5 MG tablet Take 5 mg by mouth at bedtime.   Yes Historical Provider, MD  venlafaxine XR (EFFEXOR-XR) 75 MG 24 hr capsule Take 75 mg by mouth every morning. 12/16/14  Yes Historical Provider, MD  amoxicillin-clavulanate (AUGMENTIN) 875-125 MG per tablet Take 1 tablet by mouth 2 (two) times daily. 04/02/15   Robbie Lis, MD  cefUROXime (CEFTIN) 500 MG tablet Take 1 tablet (500 mg total) by mouth 2 (two) times daily with a meal. Patient not taking: Reported on 03/31/2015 03/17/15   Samuella Cota, MD  metroNIDAZOLE (FLAGYL) 500 MG tablet Take 1 tablet (500 mg total) by mouth 3 (three) times daily. 04/02/15   Robbie Lis, MD  saccharomyces boulardii (FLORASTOR) 250 MG capsule Take 1 capsule (250 mg total) by mouth 2 (two) times daily. 04/02/15   Robbie Lis, MD  tiZANidine (ZANAFLEX) 4 MG tablet Take 4 mg by mouth every 6 (six) hours as needed. Muscle spasms 02/20/15   Historical Provider, MD   Vital Signs: BP 109/55 mmHg  Pulse 88  Temp(Src) 98.2 F (36.8 C) (Oral)  Resp 18  Ht 5\' 9"  (1.753 m)  Wt 271 lb 2.7 oz (123 kg)  BMI 40.03 kg/m2  SpO2 95%  Physical Exam General: A&Ox3, NAD, sitting up in chair Abd: Soft, NT, ND, RUQ drain intact  serous output, 24 hr 20cc  Imaging: Dg Chest 2 View  03/31/2015   CLINICAL DATA:  Generalized weakness and nausea following cholecystectomy  EXAM: CHEST  2 VIEW  COMPARISON:  03/10/2015  FINDINGS: Cardiac shadow is stable. Mild bibasilar atelectatic changes are seen. No focal effusion is noted. No acute bony abnormality is seen. No free air is noted within the abdomen.  IMPRESSION: Mild bibasilar atelectasis.  No acute abnormality is noted.   Electronically Signed   By: Inez Catalina M.D.   On: 03/31/2015 19:45   Ct Abdomen Pelvis W  Contrast  03/31/2015   CLINICAL DATA:  Intermittent diarrhea. Persistent nausea since gallbladder surgery. History hernia repair.  EXAM: CT ABDOMEN AND PELVIS WITH CONTRAST  TECHNIQUE: Multidetector CT imaging of the abdomen and pelvis was performed using the standard protocol following bolus administration of intravenous contrast.  CONTRAST:  113mL OMNIPAQUE IOHEXOL 300 MG/ML  SOLN  COMPARISON:  CT abdomen pelvis - 03/06/2015; ERCP - 03/10/2015; 03/08/2015  FINDINGS: Normal hepatic contour. No discrete hepatic lesions. The patient has undergone interval cholecystectomy with placement of a biliary stent within the CBD. There is a minimal amount of expected pneumobilia within the nondependent portion of the liver.  There is a an ill-defined approximately 4.3 x 2.1 x 2.0 cm hypo attenuating fluid collection within the right lobe of the liver adjacent to the gallbladder fossa (coronal image 43, series 4) which is noted to contain several foci of subcutaneous emphysema. There is a minimal amount of stranding about the gallbladder fossa. No additional discrete hepatic lesions. No ascites.  There is symmetric enhancement and excretion of the bilateral kidneys. Note is made of an approximately 1.3 cm hypo attenuating partially exophytic cyst arising from the interpolar aspect of the right kidney (46, series 3). Additional bilateral subcentimeter hypoattenuating lesions too small likely characterize of favored to represent additional renal cyst cysts. No definite renal stones in this postcontrast examination. There is a minimal amount of grossly symmetric likely body habitus related perinephric stranding. No urinary obstruction. Normal appearance of the bilateral adrenal glands, pancreas and spleen.  Ingested enteric contrast extends to the level of the proximal ascending colon. There is gas distention of the rectum. The bowel is normal in course and caliber without wall thickening or evidence of obstruction. Normal  appearance of the appendix. No pneumoperitoneum, pneumatosis or portal venous gas. Small hiatal hernia.  Scattered atherosclerotic plaque within a normal caliber abdominal aorta. The major branch vessels of the abdominal aorta appear patent on this non CTA examination. Scattered retroperitoneal lymph nodes individually not enlarged by size criteria including index periaortic lymph node at the level of the diaphragmatic hiatus, measuring approximately 0.7 cm in diameter. No retroperitoneal, mesenteric, pelvic or inguinal lymphadenopathy.  Normal appearance of the pelvic organs. Normal appearance of the urinary bladder given degree distention. No free fluid in the pelvic cul-de-sac.  Limited visualization of the lower thorax demonstrates scatter nodule within the imaged bilateral lower lobes with dominant nodule within the left lower lobe measuring 8 mm in diameter (image 16, series 7 with additional 6 mm pulmonary nodules seen within the right lower lobe (image 5, series 7) and the left lower lobe (images 15 and 17). There is minimal bibasilar subsegmental atelectasis. No focal airspace opacities. No pleural effusion.  Normal heart size. Coronary artery calcifications. No pericardial effusion.  No acute or aggressive osseous abnormalities. Moderate multilevel lumbar spine DDD, worse at L3-L4 and L4-L5 with disc space height loss, endplate irregularity and small posteriorly  directed disc osteophyte complexes at these locations. Mild scoliotic curvature of the thoracolumbar spine, convex to the left. Stigmata of dish within the caudal aspect of the thoracic spine. Mild degenerative change of the bilateral hips, right greater than left.  Post mesh repair of the right lateral ventral abdominal wall. Unchanged small mesenteric fat containing periumbilical hernia. Unchanged small bilateral mesenteric fat containing inguinal hernias, left greater than right.  IMPRESSION: 1. Post cholecystectomy and placement of a biliary  stent. 2. Interval development of an ill-defined approximately 4.3 cm mixed air and fluid containing structure within the right lobe of the liver adjacent to the gallbladder fossa - nonspecific and while potentially postoperative in etiology, a developing poorly defined hepatic abscess could have a similar appearance. Clinical correlation is advised. 3. Unchanged pulmonary nodules within the imaged bilateral lower lobes, the largest of which within the left lower lobe measures 8 mm in diameter. If the patient is at high risk for bronchogenic carcinoma, follow-up chest CT at 3-82months is recommended. If the patient is at low risk for bronchogenic carcinoma, follow-up chest CT at 6-12 months is recommended. This recommendation follows the consensus statement: Guidelines for Management of Small Pulmonary Nodules Detected on CT Scans: A Statement from the [place] as published in Radiology 2005; 237:395-400.   Electronically Signed   By: Sandi Mariscal M.D.   On: 03/31/2015 22:48   Ct Image Guided Drainage Percut Cath  Peritoneal Retroperit  04/01/2015   CLINICAL DATA:  Status post cholecystectomy. Small gallbladder fossa air-fluid collection. Status post endoscopic biliary stent.  EXAM: CT-GUIDED GALLBLADDER FOSSA FLUID COLLECTION DRAIN INSERTION  Date:  5/25/20165/25/2016 11:41 am  Radiologist:  M. Daryll Brod, MD  Guidance:  CT  FLUOROSCOPY TIME:  None.  MEDICATIONS AND MEDICAL HISTORY: 1 mg Versed, 50 mcg fentanyl  ANESTHESIA/SEDATION: 15 minutes  CONTRAST:  None  COMPLICATIONS: None  PROCEDURE: Informed consent was obtained from the patient following explanation of the procedure, risks, benefits and alternatives. The patient understands, agrees and consents for the procedure. All questions were addressed. A time out was performed.  Maximal barrier sterile technique utilized including caps, mask, sterile gowns, sterile gloves, large sterile drape, hand hygiene, and Betadine.  Previous imaging reviewed.  Patient positioned supine. Noncontrast localization CT performed. The small gallbladder fossa air-fluid collection was localized. Under sterile conditions and local anesthesia, an 18 gauge 15 cm access needle was advanced from a percutaneous transhepatic approach into the gallbladder fossa air-fluid collection. Needle position confirmed with CT. Syringe aspiration yielded thin blood tinged fluid. Guidewire inserted followed by tract dilatation to insert a 10 [place] drain. Retention loop formed in the gallbladder fossa fluid collection. Catheter secured with a Prolene suture. Position confirmed with CT. External suction bulb connected. No immediate complication. Patient tolerated the procedure well.  IMPRESSION: Successful CT-guided gallbladder fossa fluid collection drain insertion. Sample sent for Gram stain and culture.   Electronically Signed   By: Jerilynn Mages.  Shick M.D.   On: 04/01/2015 11:47    Labs:  CBC:  Recent Labs  03/13/15 0330 03/14/15 0345 03/31/15 1715 04/01/15 0454  WBC 15.9* 11.7* 8.0 5.9  HGB 9.8* 10.1* 12.5* 11.5*  HCT 28.5* 30.1* 39.1 35.7*  PLT 307 347 397 335    COAGS:  Recent Labs  03/07/15 0514 03/31/15 1935 04/01/15 0454  INR 1.26 1.06 1.12    BMP:  Recent Labs  03/16/15 0515 03/17/15 0535 03/31/15 1715 04/01/15 0454  NA 139 137 136 136  K 3.6 3.3* 4.2 3.5  CL 104 105 101 100*  CO2 26 24 22 25   GLUCOSE 150* 162* 154* 190*  BUN 13 14 14 12   CALCIUM 8.2* 8.2* 9.5 9.1  CREATININE 0.73 0.83 0.79 0.84  GFRNONAA >60 >60 >60 >60  GFRAA >60 >60 >60 >60    LIVER FUNCTION TESTS:  Recent Labs  03/16/15 0515 03/17/15 0535 03/31/15 1715 04/01/15 0454  BILITOT 3.3* 2.2* 1.6* 1.5*  AST 84* 84* 47* 41  ALT 103* 106* 46 42  ALKPHOS 248* 222* 187* 167*  PROT 6.1* 5.7* 7.4 6.7  ALBUMIN 2.2* 2.1* 3.4* 3.2*    Assessment and Plan: Cholangitis and choledocholithiasis S/p ERCP and stenting, Lap chole 5/1 GB fossa fluid collection seen on CT S/p perc drain  placement 5/25, serous minimal output, Cx pending, wbc wnl, afebrile Plans per CCS/TR[facility]   Signed: Hedy Jacob 04/02/2015, 12:30 PM   I spent a total of 15 Minutes in face to face in clinical consultation/evaluation, greater than 50% of which was counseling/coordinating care for GB fossa fluid collection.

## 2015-04-02 NOTE — Discharge Summary (Addendum)
Physician Discharge Summary  Justin Cohen JSE:[id number] DOB: July 16, 1946 DOA: 03/31/2015  PCP: Gara Kroner, MD  Admit date: 03/31/2015 Discharge date: 04/02/2015  Recommendations for Outpatient Follow-up:  Please follow-up with interventional radiology for recommendations on drain removal in next 1-2 weeks per scheduled appointment. Recommend to continue antibodies for next 7 days on discharge, Flagyl and Augmentin.   Lung nodules seen on CT scan. The largest in the left lower lobe about 8 mm in diameter. If the patient is at high risk for bronchogenic carcinoma follow-up CT chest is recommended at 3-6 months otherwise repeat scan in 6-12 months   Discharge Diagnoses:  Active Problems:   HTN (hypertension)   DM w/o complication type II   Hyperlipidemia   Pericholecystic abscess   Postoperative wound abscess   Abscess   Malnutrition of moderate degree    Discharge Condition: stable   Diet recommendation: as tolerated   History of present illness:  69 year old male with past medical history of hypertension, diabetes, dyslipidemia, recent hospitalization for sepsis secondary to Escherichia coli bacteremia secondary to cholangitis and ischemic/necrotic cholecystitis, status post CBD stent in for distal stricture and subsequently status post cholecystectomy on 03/12/2015. Patient presented to [facility] with reports of not feeling well since past hospitalization. He has had a diarrhea at home but no diarrhea during this hospital stay. CT abdomen on this admission showed a fluid collection and possible abscess within the right lobe of the liver adjacent to the gallbladder fossa. Patient was started on Zosyn and Flagyl on the admission. GI has seen the patient in consultation. He underwent CT-guided aspiration and drainage of gallbladder fossa fluid collection by interventional radiology on 04/01/2015.   Hospital Course:   Assessment/Plan:    Principal problem: Fluid  collection in the right lower lobe of liver / possible hepatic abscess - CT abdomen on this admission showed a fluid collection and possible abscess formation within the right lobe of the liver adjacent to the gallbladder fossa. - Patient underwent CT-guided aspiration and drainage of gallbladder fossa fluid collection done 04/01/2015 by interventional radiology. - He was placed on Zosyn and Flagyl. At the time of discharge, will prescribe Augmentin and Flagyl for 7 more days. - Patient was seen by surgery and GI in consultation.  Active Problems: Essential hypertension - Continue Norvasc 5 mg twice daily, Lotensin 10 mg twice daily  Pulmonary nodules in bilateral lower lobes - Seen on CT scan. The largest in the left lower lobe about 8 mm in diameter. If the patient is at high risk for bronchogenic carcinoma follow-up CT chest is recommended at 3-6 months otherwise repeat scan in 6-12 months.  DM w/o complication type II - Continue metformin 1000 mg twice daily, actos 45 mg daily and glipizide 5 mg daily  Dyslipidemia - Patient is on omega-3 supplementation and crestor.   Moderate protein calorie malnutrition - In the context of acute illness - Nutrition consulted    DVT Prophylaxis  - SCD's bilaterally in hospital.   Code Status: Full.  Family Communication: plan of care discussed with the patient's sister at the bedside    IV access:  Peripheral IV  Procedures and diagnostic studies:   Dg Chest 2 View 03/31/2015 Mild bibasilar atelectasis. No acute abnormality is noted. Electronically Signed By: Inez Catalina M.D. On: 03/31/2015 19:45   Ct Abdomen Pelvis W Contrast 03/31/2015 1. Post cholecystectomy and placement of a biliary stent. 2. Interval development of an ill-defined approximately 4.3 cm mixed air and fluid containing  structure within the right lobe of the liver adjacent to the gallbladder fossa - nonspecific and while potentially postoperative in  etiology, a developing poorly defined hepatic abscess could have a similar appearance. Clinical correlation is advised. 3. Unchanged pulmonary nodules within the imaged bilateral lower lobes, the largest of which within the left lower lobe measures 8 mm in diameter. If the patient is at high risk for bronchogenic carcinoma, follow-up chest CT at 3-70months is recommended. If the patient is at low risk for bronchogenic carcinoma, follow-up chest CT at 6-12 months is recommended. This recommendation follows the consensus statement: Guidelines for Management of Small Pulmonary Nodules Detected on CT Scans: A Statement from the [employer] as published in Radiology 2005; 237:395-400. Electronically Signed By: Sandi Mariscal M.D. On: 03/31/2015 22:48    Medical Consultants:  Gastroenterology, Dr. Clarene Essex Interventional radiology  Other Consultants:  None   IAnti-Infectives:   Zosyn 03/31/2015 --> 04/02/15 Flagyl 03/31/2015 --> 04/02/15 - conver to PO on discharge for 7 days.    SignedLeisa Lenz, MD  [facility] 04/02/2015, 10:12 AM  Pager #: [phone]  Time spent in minutes: more than 30 minutes   Discharge Exam: Filed Vitals:   04/02/15 0950  BP: 109/55  Pulse: 88  Temp: 98.2 F (36.8 C)  Resp:    Filed Vitals:   04/01/15 2109 04/01/15 2125 04/02/15 0540 04/02/15 0950  BP: 134/59 122/62 127/66 109/55  Pulse:  88 89 88  Temp:  97.8 F (36.6 C) 98 F (36.7 C) 98.2 F (36.8 C)  TempSrc:  Oral Oral Oral  Resp:  16 18   Height:      Weight:      SpO2:  92% 90% 95%    General: Pt is alert, follows commands appropriately, not in acute distress Cardiovascular: Regular rate and rhythm, S1/S2 +, no murmurs Respiratory: Clear to auscultation bilaterally, no wheezing, no crackles, no rhonchi Abdominal: Soft, non tender, non distended, bowel sounds +, no guarding; JP drain in place Extremities: no edema, no cyanosis, pulses palpable bilaterally DP  and PT Neuro: Grossly nonfocal  Discharge Instructions  Discharge Instructions    Call MD for:  difficulty breathing, headache or visual disturbances    Complete by:  As directed      Call MD for:  persistant nausea and vomiting    Complete by:  As directed      Call MD for:  severe uncontrolled pain    Complete by:  As directed      Diet - low sodium heart healthy    Complete by:  As directed      Discharge instructions    Complete by:  As directed   Please follow-up with interventional radiology for recommendations on drain removal in next 1-2 weeks per scheduled appointment. Recommend to continue antibodies for next 7 days on discharge, Flagyl and Augmentin.     Increase activity slowly    Complete by:  As directed             Medication List    STOP taking these medications        cefUROXime 500 MG tablet  Commonly known as:  CEFTIN      TAKE these medications        amLODipine-benazepril 5-10 MG per capsule  Commonly known as:  LOTREL  Take 1 capsule by mouth 2 (two) times daily.     amoxicillin-clavulanate 875-125 MG per tablet  Commonly known as:  AUGMENTIN  Take  1 tablet by mouth 2 (two) times daily.     aspirin 81 MG tablet  Take 81 mg by mouth daily.     cetirizine 10 MG tablet  Commonly known as:  ZYRTEC  Take 10 mg by mouth daily.     dorzolamide-timolol 22.3-6.8 MG/ML ophthalmic solution  Commonly known as:  COSOPT  Place 1 drop into both eyes 2 (two) times daily.     Fish Oil 1000 MG Caps  Take 1,000 mg by mouth every morning.     fluticasone 50 MCG/ACT nasal spray  Commonly known as:  FLONASE  Place 1 spray into both nostrils daily as needed for allergies (allergies).     glipiZIDE 5 MG tablet  Commonly known as:  GLUCOTROL  Take 5 mg by mouth every morning.     HYDROcodone-acetaminophen 7.5-325 MG per tablet  Commonly known as:  NORCO  Take 1-2 tablets by mouth every 4 (four) hours as needed for moderate pain or severe pain (pain). For  moderate pain     metFORMIN 1000 MG tablet  Commonly known as:  GLUCOPHAGE  Take 1,000 mg by mouth 2 (two) times daily.     metroNIDAZOLE 500 MG tablet  Commonly known as:  FLAGYL  Take 1 tablet (500 mg total) by mouth 3 (three) times daily.     multivitamin with minerals Tabs tablet  Take 1 tablet by mouth every morning.     pioglitazone 45 MG tablet  Commonly known as:  ACTOS  Take 45 mg by mouth every morning.     PREVACID 15 MG capsule  Generic drug:  lansoprazole  Take 15 mg by mouth every morning.     PROBIOTIC DAILY PO  Take 1 capsule by mouth daily.     rosuvastatin 5 MG tablet  Commonly known as:  CRESTOR  Take 5 mg by mouth at bedtime.     saccharomyces boulardii 250 MG capsule  Commonly known as:  FLORASTOR  Take 1 capsule (250 mg total) by mouth 2 (two) times daily.     tiZANidine 4 MG tablet  Commonly known as:  ZANAFLEX  Take 4 mg by mouth every 6 (six) hours as needed. Muscle spasms     venlafaxine XR 75 MG 24 hr capsule  Commonly known as:  EFFEXOR-XR  Take 75 mg by mouth every morning.     Vitamin D 2000 UNITS tablet  Take 2,000 Units by mouth daily.           Follow-up Information    Follow up with Gara Kroner, MD. Schedule an appointment as soon as possible for a visit in 2 weeks.   Specialty:  Family Medicine   Why:  Follow up appt after recent hospitalization   Contact information:   [address] [place] [address] [phone]        The results of significant diagnostics from this hospitalization (including imaging, microbiology, ancillary and laboratory) are listed below for reference.    Significant Diagnostic Studies: X-ray Chest Pa Or Ap  03/10/2015   CLINICAL DATA:  Difficulty breathing after ERCP. History of melanoma and prostate carcinoma  EXAM: CHEST  1 VIEW  COMPARISON:  March 06, 2015  FINDINGS: There is atelectatic change in both lower lobes. There is no edema or consolidation. Heart is mildly  enlarged with pulmonary vascularity within normal limits. No adenopathy. There is degenerative change in the thoracic spine. There are no blastic or lytic bone lesions apparent.  IMPRESSION: Atelectatic  change in both lower lobes. Elsewhere lungs clear. Heart prominent but stable.   Electronically Signed   By: Lowella Grip III M.D.   On: 03/10/2015 17:55   Dg Chest 2 View  03/31/2015   CLINICAL DATA:  Generalized weakness and nausea following cholecystectomy  EXAM: CHEST  2 VIEW  COMPARISON:  03/10/2015  FINDINGS: Cardiac shadow is stable. Mild bibasilar atelectatic changes are seen. No focal effusion is noted. No acute bony abnormality is seen. No free air is noted within the abdomen.  IMPRESSION: Mild bibasilar atelectasis.  No acute abnormality is noted.   Electronically Signed   By: Inez Catalina M.D.   On: 03/31/2015 19:45   Dg Abd 1 View  03/11/2015   CLINICAL DATA:  Abdominal pain.  EXAM: ABDOMEN - 1 VIEW  COMPARISON:  CT 03/06/2015.  FINDINGS: Biliary catheter not right. Surgical clips of the right. Minimally distended air-filled loops of small bowel. Colonic gas pattern unremarkable. Oral contrast in the colon . Mild adynamic ileus cannot be completely excluded. Degenerative changes lumbar spine .  IMPRESSION: 1. Biliary drainage catheter noted over the right upper quadrant. 2. Air-filled loops of small and large bowel noted. Minimal distention. Oral contrast in the colon. Mild adynamic ileus cannot be excluded P   Electronically Signed   By: [place]   On: 03/11/2015 09:46   Ct Head Wo Contrast  03/06/2015   CLINICAL DATA:  Altered mental status started today.  Fever.  EXAM: CT HEAD WITHOUT CONTRAST  TECHNIQUE: Contiguous axial images were obtained from the base of the skull through the vertex without intravenous contrast.  COMPARISON:  None.  FINDINGS: There is mild central and cortical atrophy. Periventricular white matter change is consistent with small vessel disease. There is no  intra or extra-axial fluid collection or mass lesion. The basilar cisterns and ventricles have a normal appearance. There is no CT evidence for acute infarction or hemorrhage. Bone windows are unremarkable.  IMPRESSION: 1. Atrophy and small vessel disease. 2.  No evidence for acute intracranial  abnormality.   Electronically Signed   By: Nolon Nations M.D.   On: 03/06/2015 21:52   US Abdomen Complete  03/06/2015   CLINICAL DATA:  Fever right abdominal pain for 2 days concern for cholangitis  EXAM: ULTRASOUND ABDOMEN COMPLETE  COMPARISON:  None.  FINDINGS: Gallbladder: Nonmobile sludge with tiny echogenic foci possibly representing calculi. No wall thickening.  Common bile duct: Diameter: Measures up to about 1 cm. There is echogenic material within the duct.  Liver: Heterogeneous echotexture with no focal abnormalities. Probable fatty infiltration.  IVC: Not seen well  Pancreas: Not seen well  Spleen: Size and appearance within normal limits.  Right Kidney: Length: 13 cm. Echogenicity within normal limits. No mass or hydronephrosis visualized.  Left Kidney: Length: 14 cm. Echogenicity within normal limits. No mass or hydronephrosis visualized.  Abdominal aorta: Obscured by bowel gas  Other findings: None.  IMPRESSION: Dilated common bile duct. Distal obstructing lesion not excluded. Echogenic material within the duct suggesting sludge.  Abundant gallbladder sludge.   Electronically Signed   By: Skipper Cliche M.D.   On: 03/06/2015 21:04   Ct Abdomen Pelvis W Contrast  03/31/2015   CLINICAL DATA:  Intermittent diarrhea. Persistent nausea since gallbladder surgery. History hernia repair.  EXAM: CT ABDOMEN AND PELVIS WITH CONTRAST  TECHNIQUE: Multidetector CT imaging of the abdomen and pelvis was performed using the standard protocol following bolus administration of intravenous contrast.  CONTRAST:  130mL OMNIPAQUE IOHEXOL  300 MG/ML  SOLN  COMPARISON:  CT abdomen pelvis - 03/06/2015; ERCP - 03/10/2015;  03/08/2015  FINDINGS: Normal hepatic contour. No discrete hepatic lesions. The patient has undergone interval cholecystectomy with placement of a biliary stent within the CBD. There is a minimal amount of expected pneumobilia within the nondependent portion of the liver.  There is a an ill-defined approximately 4.3 x 2.1 x 2.0 cm hypo attenuating fluid collection within the right lobe of the liver adjacent to the gallbladder fossa (coronal image 43, series 4) which is noted to contain several foci of subcutaneous emphysema. There is a minimal amount of stranding about the gallbladder fossa. No additional discrete hepatic lesions. No ascites.  There is symmetric enhancement and excretion of the bilateral kidneys. Note is made of an approximately 1.3 cm hypo attenuating partially exophytic cyst arising from the interpolar aspect of the right kidney (46, series 3). Additional bilateral subcentimeter hypoattenuating lesions too small likely characterize of favored to represent additional renal cyst cysts. No definite renal stones in this postcontrast examination. There is a minimal amount of grossly symmetric likely body habitus related perinephric stranding. No urinary obstruction. Normal appearance of the bilateral adrenal glands, pancreas and spleen.  Ingested enteric contrast extends to the level of the proximal ascending colon. There is gas distention of the rectum. The bowel is normal in course and caliber without wall thickening or evidence of obstruction. Normal appearance of the appendix. No pneumoperitoneum, pneumatosis or portal venous gas. Small hiatal hernia.  Scattered atherosclerotic plaque within a normal caliber abdominal aorta. The major branch vessels of the abdominal aorta appear patent on this non CTA examination. Scattered retroperitoneal lymph nodes individually not enlarged by size criteria including index periaortic lymph node at the level of the diaphragmatic hiatus, measuring approximately 0.7  cm in diameter. No retroperitoneal, mesenteric, pelvic or inguinal lymphadenopathy.  Normal appearance of the pelvic organs. Normal appearance of the urinary bladder given degree distention. No free fluid in the pelvic cul-de-sac.  Limited visualization of the lower thorax demonstrates scatter nodule within the imaged bilateral lower lobes with dominant nodule within the left lower lobe measuring 8 mm in diameter (image 16, series 7 with additional 6 mm pulmonary nodules seen within the right lower lobe (image 5, series 7) and the left lower lobe (images 15 and 17). There is minimal bibasilar subsegmental atelectasis. No focal airspace opacities. No pleural effusion.  Normal heart size. Coronary artery calcifications. No pericardial effusion.  No acute or aggressive osseous abnormalities. Moderate multilevel lumbar spine DDD, worse at L3-L4 and L4-L5 with disc space height loss, endplate irregularity and small posteriorly directed disc osteophyte complexes at these locations. Mild scoliotic curvature of the thoracolumbar spine, convex to the left. Stigmata of dish within the caudal aspect of the thoracic spine. Mild degenerative change of the bilateral hips, right greater than left.  Post mesh repair of the right lateral ventral abdominal wall. Unchanged small mesenteric fat containing periumbilical hernia. Unchanged small bilateral mesenteric fat containing inguinal hernias, left greater than right.  IMPRESSION: 1. Post cholecystectomy and placement of a biliary stent. 2. Interval development of an ill-defined approximately 4.3 cm mixed air and fluid containing structure within the right lobe of the liver adjacent to the gallbladder fossa - nonspecific and while potentially postoperative in etiology, a developing poorly defined hepatic abscess could have a similar appearance. Clinical correlation is advised. 3. Unchanged pulmonary nodules within the imaged bilateral lower lobes, the largest of which within the left  lower lobe measures  8 mm in diameter. If the patient is at high risk for bronchogenic carcinoma, follow-up chest CT at 3-59months is recommended. If the patient is at low risk for bronchogenic carcinoma, follow-up chest CT at 6-12 months is recommended. This recommendation follows the consensus statement: Guidelines for Management of Small Pulmonary Nodules Detected on CT Scans: A Statement from the [employer] as published in Radiology 2005; 237:395-400.   Electronically Signed   By: Sandi Mariscal M.D.   On: 03/31/2015 22:48   Ct Abdomen Pelvis W Contrast  03/07/2015   CLINICAL DATA:  Acute onset of generalized abdominal pain. Initial encounter.  EXAM: CT ABDOMEN AND PELVIS WITH CONTRAST  TECHNIQUE: Multidetector CT imaging of the abdomen and pelvis was performed using the standard protocol following bolus administration of intravenous contrast.  CONTRAST:  125mL OMNIPAQUE IOHEXOL 300 MG/ML  SOLN  COMPARISON:  Abdominal ultrasound performed earlier today at 8:25 p.m.  FINDINGS: Multiple scattered small nodules are noted at the left lower lobe, measuring up to 6 mm in size. Given multiplicity of nodules, these are thought to be postinfectious in nature.  A few stones are seen dependently within the gallbladder. The gallbladder is mildly distended. The common bile duct is dilated to 1.0 cm in diameter, with intrahepatic biliary ductal dilatation, and suggestion of multiple small stones within the distal common bile duct. The liver and spleen are otherwise unremarkable. The pancreas and adrenal glands are unremarkable.  Scattered small right renal cysts are noted. These measure up to 1.4 cm in size. Nonspecific perinephric stranding and fluid are noted bilaterally. The left kidney is otherwise unremarkable. No renal or ureteral stones are identified. There is no evidence of hydronephrosis.  No free fluid is identified. The small bowel is unremarkable in appearance. The stomach is within normal limits. No acute  vascular abnormalities are seen. Scattered calcification is seen along the abdominal aorta and its branches.  The appendix is normal in caliber, without evidence of appendicitis. The colon is unremarkable in appearance.  The bladder is decompressed and not well assessed. The patient is status post prostatectomy. Small bilateral inguinal hernias are seen, containing only fat. No inguinal lymphadenopathy is seen.  No acute osseous abnormalities are identified. Multilevel vacuum phenomenon is noted along the lumbar spine. Underlying facet disease is noted.  IMPRESSION: 1. Cholelithiasis, with mild distention of the gallbladder, dilatation of the common bile duct to 1.0 cm in diameter, and intrahepatic biliary duct dilatation. Suggestion of multiple small stones within the distal common bile duct. Findings raise concern for distal obstruction. ERCP or MRCP could be performed for further evaluation. 2. Scattered small right renal cyst noted. 3. Scattered calcification along the abdominal aorta and its branches. 4. Small bilateral inguinal hernias, containing only fat. 5. Minimal degenerative change along the lumbar spine. 6. Multiple scattered small nodules at the left lower lung lobe, measuring up to 6 mm. Given multiplicity of nodules, these are most likely postinfectious in nature. However, if the patient is at high risk for bronchogenic carcinoma, follow-up chest CT at 6-12 months is recommended. If the patient is at low risk for bronchogenic carcinoma, follow-up chest CT at 12 months is recommended. This recommendation follows the consensus statement: Guidelines for Management of Small Pulmonary Nodules Detected on CT Scans: A Statement from the [employer] as published in Radiology 2005;237:395-400.   Electronically Signed   By: Garald Balding M.D.   On: 03/07/2015 00:43   Dg Chest Port 1 View  03/06/2015   CLINICAL DATA:  Two day history of fever  EXAM: PORTABLE CHEST - 1 VIEW  COMPARISON:  None.   FINDINGS: There is patchy infiltrate in the left lower lobe. Lungs elsewhere clear. Heart is upper normal in size with pulmonary vascularity within normal limits. No adenopathy. No bone lesions.  IMPRESSION: Patchy infiltrate left lower lobe.   Electronically Signed   By: Lowella Grip III M.D.   On: 03/06/2015 20:08   Dg Ercp Biliary & Pancreatic Ducts  03/10/2015   CLINICAL DATA:  Jaundice.  EXAM: ERCP  TECHNIQUE: Multiple spot images obtained with the fluoroscopic device and submitted for interpretation post-procedure.  FLUOROSCOPY TIME:  Fluoroscopy Time:  6 minutes and 18 seconds  Number of Acquired Images:  6  COMPARISON:  03/08/2015  FINDINGS: Filling defects in the common bile duct are compatible with stones. Opacification of the extrahepatic biliary system and the central intrahepatic bile ducts. Evidence of a balloon pull-through for stone extraction. In addition, evidence for balloon dilatation of the distal common bile duct. Placement of a non-metallic biliary stent.  IMPRESSION: Stone extraction and stent placement.  These images were submitted for radiologic interpretation only. Please see the procedural report for the amount of contrast and the fluoroscopy time utilized.   Electronically Signed   By: Markus Daft M.D.   On: 03/10/2015 18:15   Dg Ercp With Sphincterotomy  03/08/2015   CLINICAL DATA:  Cholangitis, biliary dilatation, and choledocholithiasis.  EXAM: ERCP  TECHNIQUE: Multiple spot images obtained with the fluoroscopic device and submitted for interpretation post-procedure.  COMPARISON:  None.  FINDINGS: Contrast injection into the distal common bile duct shows mild diffuse biliary ductal dilatation, as well as several irregular filling defects within the distal common bile duct, consistent with choledocholithiasis.  Subsequent images show passage of a balloon extraction catheter. Final images show no definite residual filling defects within the common bile duct.  IMPRESSION: Mild  biliary ductal dilatation and distal common bile duct calculi. No definite residual biliary calculi seen following passage of balloon extraction catheter.  These images were submitted for radiologic interpretation only. Please see the procedural report for the amount of contrast and the fluoroscopy time utilized.   Electronically Signed   By: Earle Gell M.D.   On: 03/08/2015 13:58   Ct Image Guided Drainage Percut Cath  Peritoneal Retroperit  04/01/2015   CLINICAL DATA:  Status post cholecystectomy. Small gallbladder fossa air-fluid collection. Status post endoscopic biliary stent.  EXAM: CT-GUIDED GALLBLADDER FOSSA FLUID COLLECTION DRAIN INSERTION  Date:  5/25/20165/25/2016 11:41 am  Radiologist:  M. Daryll Brod, MD  Guidance:  CT  FLUOROSCOPY TIME:  None.  MEDICATIONS AND MEDICAL HISTORY: 1 mg Versed, 50 mcg fentanyl  ANESTHESIA/SEDATION: 15 minutes  CONTRAST:  None  COMPLICATIONS: None  PROCEDURE: Informed consent was obtained from the patient following explanation of the procedure, risks, benefits and alternatives. The patient understands, agrees and consents for the procedure. All questions were addressed. A time out was performed.  Maximal barrier sterile technique utilized including caps, mask, sterile gowns, sterile gloves, large sterile drape, hand hygiene, and Betadine.  Previous imaging reviewed. Patient positioned supine. Noncontrast localization CT performed. The small gallbladder fossa air-fluid collection was localized. Under sterile conditions and local anesthesia, an 18 gauge 15 cm access needle was advanced from a percutaneous transhepatic approach into the gallbladder fossa air-fluid collection. Needle position confirmed with CT. Syringe aspiration yielded thin blood tinged fluid. Guidewire inserted followed by tract dilatation to insert a 10 [place] drain. Retention loop formed in  the gallbladder fossa fluid collection. Catheter secured with a Prolene suture. Position confirmed with CT.  External suction bulb connected. No immediate complication. Patient tolerated the procedure well.  IMPRESSION: Successful CT-guided gallbladder fossa fluid collection drain insertion. Sample sent for Gram stain and culture.   Electronically Signed   By: Jerilynn Mages.  Shick M.D.   On: 04/01/2015 11:47    Microbiology: Recent Results (from the past 240 hour(s))  Culture, routine-abscess     Status: None (Preliminary result)   Collection Time: 04/01/15 11:10 AM  Result Value Ref Range Status   Specimen Description ABSCESS RIGHT UPPER QUAD  Final   Special Requests Normal  Final   Gram Stain   Final    ABUNDANT WBC PRESENT, PREDOMINANTLY PMN NO SQUAMOUS EPITHELIAL CELLS SEEN NO ORGANISMS SEEN Performed at [facility]    Culture PENDING  Incomplete   Report Status PENDING  Incomplete     Labs: Basic Metabolic Panel:  Recent Labs Lab 03/31/15 1715 04/01/15 0454  NA 136 136  K 4.2 3.5  CL 101 100*  CO2 22 25  GLUCOSE 154* 190*  BUN 14 12  CREATININE 0.79 0.84  CALCIUM 9.5 9.1   Liver Function Tests:  Recent Labs Lab 03/31/15 1715 04/01/15 0454  AST 47* 41  ALT 46 42  ALKPHOS 187* 167*  BILITOT 1.6* 1.5*  PROT 7.4 6.7  ALBUMIN 3.4* 3.2*    Recent Labs Lab 03/31/15 1715  LIPASE 19*   No results for input(s): AMMONIA in the last 168 hours. CBC:  Recent Labs Lab 03/31/15 1715 04/01/15 0454  WBC 8.0 5.9  NEUTROABS 4.9  --   HGB 12.5* 11.5*  HCT 39.1 35.7*  MCV 87.5 87.3  PLT 397 335   Cardiac Enzymes:  Recent Labs Lab 03/31/15 1935  TROPONINI <0.03   BNP: BNP (last 3 results)  Recent Labs  03/31/15 1935  BNP 15.3    ProBNP (last 3 results) No results for input(s): PROBNP in the last 8760 hours.  CBG:  Recent Labs Lab 04/01/15 0847 04/01/15 1155 04/01/15 1700 04/01/15 2123 04/02/15 0801  GLUCAP 168* 118* 156* 152* 145*

## 2015-04-02 NOTE — Progress Notes (Signed)
Joyce Copa 10:20 AM  Subjective: Patient doing much better without any GI complaints and eating much better and case discussed with his wife as well  Objective: Vital signs stable afebrile abdomen is soft nontender labs improved CT drainage noted  Assessment: Status post cholecystectomy and 2 ERCPs now with percutaneous drainage  Plan: Call me when necessary follow-up with me 1 week after drain removed and then we will schedule ERCP and stent removal and evaluation of stricture if present and they will call me sooner as above and he can go home when a plan about the drain is confirmed with interventional radiology  [facility] E  Pager [phone] After 5PM or if no answer call [phone]

## 2015-04-02 NOTE — Patient Outreach (Signed)
[facility]) Care Management  04/02/2015  Justin Cohen 1946-07-28 [id number]   Notification from [name], RN to close case due to patient refused to participate with [place] Management.  [name]. [place], [place] Management [place] Assistant Phone: [phone] Fax: [phone]

## 2015-04-02 NOTE — Progress Notes (Signed)
04/02/15  1200  Reviewed discharge instructions with patient. Patient verbalized understanding of discharge instructions. Copy of discharge instructions and prescriptions given to patient.

## 2015-04-02 NOTE — Progress Notes (Signed)
PT Cancellation Note  Patient Details Name: HELDER CRISAFULLI MRN: [id number] DOB: [birth year]   Cancelled Treatment:    Reason Eval/Treat Not Completed: PT screened, no needs identified, will sign off (pt reports I and to D/C today)   [facility] 04/02/2015, 12:00 PM

## 2015-04-02 NOTE — Discharge Instructions (Signed)

## 2015-04-03 DIAGNOSIS — I10 Essential (primary) hypertension: Secondary | ICD-10-CM | POA: Diagnosis not present

## 2015-04-03 DIAGNOSIS — E119 Type 2 diabetes mellitus without complications: Secondary | ICD-10-CM | POA: Diagnosis not present

## 2015-04-03 DIAGNOSIS — M150 Primary generalized (osteo)arthritis: Secondary | ICD-10-CM | POA: Diagnosis not present

## 2015-04-03 DIAGNOSIS — Z471 Aftercare following joint replacement surgery: Secondary | ICD-10-CM | POA: Diagnosis not present

## 2015-04-03 DIAGNOSIS — Z720 Tobacco use: Secondary | ICD-10-CM | POA: Diagnosis not present

## 2015-04-03 DIAGNOSIS — Z96652 Presence of left artificial knee joint: Secondary | ICD-10-CM | POA: Diagnosis not present

## 2015-04-05 LAB — CULTURE, ROUTINE-ABSCESS
CULTURE: NO GROWTH
SPECIAL REQUESTS: NORMAL

## 2015-04-07 ENCOUNTER — Encounter (HOSPITAL_COMMUNITY): Payer: Self-pay | Admitting: Orthopedic Surgery

## 2015-04-07 DIAGNOSIS — M1712 Unilateral primary osteoarthritis, left knee: Secondary | ICD-10-CM | POA: Diagnosis not present

## 2015-04-07 DIAGNOSIS — M25562 Pain in left knee: Secondary | ICD-10-CM | POA: Diagnosis not present

## 2015-04-07 DIAGNOSIS — R262 Difficulty in walking, not elsewhere classified: Secondary | ICD-10-CM | POA: Diagnosis not present

## 2015-04-08 DIAGNOSIS — M25562 Pain in left knee: Secondary | ICD-10-CM | POA: Diagnosis not present

## 2015-04-08 DIAGNOSIS — M1712 Unilateral primary osteoarthritis, left knee: Secondary | ICD-10-CM | POA: Diagnosis not present

## 2015-04-08 DIAGNOSIS — R262 Difficulty in walking, not elsewhere classified: Secondary | ICD-10-CM | POA: Diagnosis not present

## 2015-04-09 DIAGNOSIS — E119 Type 2 diabetes mellitus without complications: Secondary | ICD-10-CM | POA: Diagnosis not present

## 2015-04-09 DIAGNOSIS — F419 Anxiety disorder, unspecified: Secondary | ICD-10-CM | POA: Diagnosis not present

## 2015-04-09 DIAGNOSIS — K830 Cholangitis: Secondary | ICD-10-CM | POA: Diagnosis not present

## 2015-04-09 DIAGNOSIS — Z96652 Presence of left artificial knee joint: Secondary | ICD-10-CM | POA: Diagnosis not present

## 2015-04-09 DIAGNOSIS — R5383 Other fatigue: Secondary | ICD-10-CM | POA: Diagnosis not present

## 2015-04-09 DIAGNOSIS — I10 Essential (primary) hypertension: Secondary | ICD-10-CM | POA: Diagnosis not present

## 2015-04-09 DIAGNOSIS — R911 Solitary pulmonary nodule: Secondary | ICD-10-CM | POA: Diagnosis not present

## 2015-04-09 DIAGNOSIS — E785 Hyperlipidemia, unspecified: Secondary | ICD-10-CM | POA: Diagnosis not present

## 2015-04-14 DIAGNOSIS — M25562 Pain in left knee: Secondary | ICD-10-CM | POA: Diagnosis not present

## 2015-04-14 DIAGNOSIS — R262 Difficulty in walking, not elsewhere classified: Secondary | ICD-10-CM | POA: Diagnosis not present

## 2015-04-14 DIAGNOSIS — M1712 Unilateral primary osteoarthritis, left knee: Secondary | ICD-10-CM | POA: Diagnosis not present

## 2015-04-15 DIAGNOSIS — R932 Abnormal findings on diagnostic imaging of liver and biliary tract: Secondary | ICD-10-CM | POA: Diagnosis not present

## 2015-04-15 DIAGNOSIS — R740 Nonspecific elevation of levels of transaminase and lactic acid dehydrogenase [LDH]: Secondary | ICD-10-CM | POA: Diagnosis not present

## 2015-04-16 DIAGNOSIS — M25562 Pain in left knee: Secondary | ICD-10-CM | POA: Diagnosis not present

## 2015-04-16 DIAGNOSIS — M1712 Unilateral primary osteoarthritis, left knee: Secondary | ICD-10-CM | POA: Diagnosis not present

## 2015-04-16 DIAGNOSIS — R262 Difficulty in walking, not elsewhere classified: Secondary | ICD-10-CM | POA: Diagnosis not present

## 2015-04-20 DIAGNOSIS — R262 Difficulty in walking, not elsewhere classified: Secondary | ICD-10-CM | POA: Diagnosis not present

## 2015-04-20 DIAGNOSIS — M1712 Unilateral primary osteoarthritis, left knee: Secondary | ICD-10-CM | POA: Diagnosis not present

## 2015-04-20 DIAGNOSIS — M25562 Pain in left knee: Secondary | ICD-10-CM | POA: Diagnosis not present

## 2015-04-22 ENCOUNTER — Other Ambulatory Visit: Payer: Self-pay

## 2015-04-22 DIAGNOSIS — L0291 Cutaneous abscess, unspecified: Secondary | ICD-10-CM

## 2015-04-22 DIAGNOSIS — M25562 Pain in left knee: Secondary | ICD-10-CM | POA: Diagnosis not present

## 2015-04-22 DIAGNOSIS — M1712 Unilateral primary osteoarthritis, left knee: Secondary | ICD-10-CM | POA: Diagnosis not present

## 2015-04-22 DIAGNOSIS — Z4803 Encounter for change or removal of drains: Secondary | ICD-10-CM

## 2015-04-22 DIAGNOSIS — R262 Difficulty in walking, not elsewhere classified: Secondary | ICD-10-CM | POA: Diagnosis not present

## 2015-04-24 DIAGNOSIS — R262 Difficulty in walking, not elsewhere classified: Secondary | ICD-10-CM | POA: Diagnosis not present

## 2015-04-24 DIAGNOSIS — M25562 Pain in left knee: Secondary | ICD-10-CM | POA: Diagnosis not present

## 2015-04-24 DIAGNOSIS — M1712 Unilateral primary osteoarthritis, left knee: Secondary | ICD-10-CM | POA: Diagnosis not present

## 2015-04-27 ENCOUNTER — Telehealth (HOSPITAL_COMMUNITY): Payer: Self-pay | Admitting: General Surgery

## 2015-04-27 DIAGNOSIS — M25562 Pain in left knee: Secondary | ICD-10-CM | POA: Diagnosis not present

## 2015-04-27 DIAGNOSIS — M1712 Unilateral primary osteoarthritis, left knee: Secondary | ICD-10-CM | POA: Diagnosis not present

## 2015-04-27 DIAGNOSIS — R262 Difficulty in walking, not elsewhere classified: Secondary | ICD-10-CM | POA: Diagnosis not present

## 2015-04-27 DIAGNOSIS — G4733 Obstructive sleep apnea (adult) (pediatric): Secondary | ICD-10-CM | POA: Diagnosis not present

## 2015-04-27 NOTE — Telephone Encounter (Signed)
Called pt's wife to schedule pt for JP drain removal. Pt's wife stated that Dr. Watt Climes said that he would need a CT scan prior to the IR drain removal to make sure that it was time for the drain to be removed. I told her that I would ask the IR doctor here at [facility] today and call her back. After speaking with Dr. Corrie Mckusick, MD the patient does need a CT abdomen without contrast prior to his IR drain check/removal. I faxed a pre-cert request to [facility] and called the pt's wife back and left her a VM with this information. I also stated that I would call her to schedule this once I got the authorization from the insurance company but that she could feel free to call me back should she have any questions or concerns prior to that. JM

## 2015-04-29 ENCOUNTER — Other Ambulatory Visit (HOSPITAL_COMMUNITY): Payer: Self-pay | Admitting: Interventional Radiology

## 2015-04-29 DIAGNOSIS — M25562 Pain in left knee: Secondary | ICD-10-CM | POA: Diagnosis not present

## 2015-04-29 DIAGNOSIS — R262 Difficulty in walking, not elsewhere classified: Secondary | ICD-10-CM | POA: Diagnosis not present

## 2015-04-29 DIAGNOSIS — Z4803 Encounter for change or removal of drains: Secondary | ICD-10-CM

## 2015-04-29 DIAGNOSIS — L0291 Cutaneous abscess, unspecified: Secondary | ICD-10-CM

## 2015-04-29 DIAGNOSIS — M1712 Unilateral primary osteoarthritis, left knee: Secondary | ICD-10-CM | POA: Diagnosis not present

## 2015-04-30 ENCOUNTER — Ambulatory Visit (HOSPITAL_COMMUNITY)
Admission: RE | Admit: 2015-04-30 | Discharge: 2015-04-30 | Disposition: A | Discharge status: Home / Self Care | Payer: Commercial Managed Care - HMO | Source: Ambulatory Visit | Attending: Interventional Radiology | Admitting: Interventional Radiology

## 2015-04-30 ENCOUNTER — Ambulatory Visit (HOSPITAL_COMMUNITY): Admission: RE | Admit: 2015-04-30 | Payer: Commercial Managed Care - HMO | Source: Ambulatory Visit

## 2015-04-30 DIAGNOSIS — Z9049 Acquired absence of other specified parts of digestive tract: Secondary | ICD-10-CM | POA: Diagnosis not present

## 2015-04-30 DIAGNOSIS — I700 Atherosclerosis of aorta: Secondary | ICD-10-CM | POA: Insufficient documentation

## 2015-04-30 DIAGNOSIS — K810 Acute cholecystitis: Secondary | ICD-10-CM | POA: Diagnosis not present

## 2015-04-30 DIAGNOSIS — Z4803 Encounter for change or removal of drains: Secondary | ICD-10-CM | POA: Diagnosis not present

## 2015-04-30 DIAGNOSIS — L0291 Cutaneous abscess, unspecified: Secondary | ICD-10-CM

## 2015-05-01 ENCOUNTER — Other Ambulatory Visit: Payer: Self-pay | Admitting: Gastroenterology

## 2015-05-01 DIAGNOSIS — M25562 Pain in left knee: Secondary | ICD-10-CM | POA: Diagnosis not present

## 2015-05-01 DIAGNOSIS — R262 Difficulty in walking, not elsewhere classified: Secondary | ICD-10-CM | POA: Diagnosis not present

## 2015-05-01 DIAGNOSIS — M1712 Unilateral primary osteoarthritis, left knee: Secondary | ICD-10-CM | POA: Diagnosis not present

## 2015-05-04 DIAGNOSIS — M1712 Unilateral primary osteoarthritis, left knee: Secondary | ICD-10-CM | POA: Diagnosis not present

## 2015-05-04 DIAGNOSIS — M25562 Pain in left knee: Secondary | ICD-10-CM | POA: Diagnosis not present

## 2015-05-04 DIAGNOSIS — R262 Difficulty in walking, not elsewhere classified: Secondary | ICD-10-CM | POA: Diagnosis not present

## 2015-05-04 NOTE — Addendum Note (Signed)
Addended byClarene Essex on: 05/04/2015 11:25 AM   Modules accepted: Orders

## 2015-05-05 DIAGNOSIS — G4733 Obstructive sleep apnea (adult) (pediatric): Secondary | ICD-10-CM | POA: Diagnosis not present

## 2015-05-06 DIAGNOSIS — R262 Difficulty in walking, not elsewhere classified: Secondary | ICD-10-CM | POA: Diagnosis not present

## 2015-05-06 DIAGNOSIS — M1712 Unilateral primary osteoarthritis, left knee: Secondary | ICD-10-CM | POA: Diagnosis not present

## 2015-05-06 DIAGNOSIS — M25562 Pain in left knee: Secondary | ICD-10-CM | POA: Diagnosis not present

## 2015-05-07 DIAGNOSIS — G4733 Obstructive sleep apnea (adult) (pediatric): Secondary | ICD-10-CM | POA: Diagnosis not present

## 2015-05-08 DIAGNOSIS — M25562 Pain in left knee: Secondary | ICD-10-CM | POA: Diagnosis not present

## 2015-05-08 DIAGNOSIS — M1712 Unilateral primary osteoarthritis, left knee: Secondary | ICD-10-CM | POA: Diagnosis not present

## 2015-05-08 DIAGNOSIS — R262 Difficulty in walking, not elsewhere classified: Secondary | ICD-10-CM | POA: Diagnosis not present

## 2015-05-12 DIAGNOSIS — M25562 Pain in left knee: Secondary | ICD-10-CM | POA: Diagnosis not present

## 2015-05-12 DIAGNOSIS — R262 Difficulty in walking, not elsewhere classified: Secondary | ICD-10-CM | POA: Diagnosis not present

## 2015-05-12 DIAGNOSIS — M1712 Unilateral primary osteoarthritis, left knee: Secondary | ICD-10-CM | POA: Diagnosis not present

## 2015-05-15 ENCOUNTER — Other Ambulatory Visit: Payer: Self-pay | Admitting: Gastroenterology

## 2015-05-15 NOTE — Addendum Note (Signed)
Addended byClarene Essex on: 05/15/2015 12:00 PM   Modules accepted: Orders

## 2015-05-19 DIAGNOSIS — G4733 Obstructive sleep apnea (adult) (pediatric): Secondary | ICD-10-CM | POA: Diagnosis not present

## 2015-05-20 ENCOUNTER — Encounter (HOSPITAL_COMMUNITY): Payer: Self-pay | Admitting: *Deleted

## 2015-05-20 MED ORDER — DEXTROSE 5 % IV SOLN
1.0000 g | INTRAVENOUS | Status: DC
  Filled 2015-05-20: qty 1

## 2015-05-20 MED ORDER — DEXTROSE 5 % IV SOLN
1.0000 g | INTRAVENOUS | Status: AC
  Administered 2015-05-21: 1 g via INTRAVENOUS
  Filled 2015-05-20 (×2): qty 1

## 2015-05-21 ENCOUNTER — Ambulatory Visit (HOSPITAL_COMMUNITY): Payer: Commercial Managed Care - HMO | Admitting: Anesthesiology

## 2015-05-21 ENCOUNTER — Ambulatory Visit (HOSPITAL_COMMUNITY)
Admission: RE | Admit: 2015-05-21 | Discharge: 2015-05-21 | Disposition: A | Discharge status: Home / Self Care | Payer: Commercial Managed Care - HMO | Source: Ambulatory Visit | Attending: Gastroenterology | Admitting: Gastroenterology

## 2015-05-21 ENCOUNTER — Encounter (HOSPITAL_COMMUNITY)
Admission: RE | Disposition: A | Discharge status: Home / Self Care | Payer: Self-pay | Source: Ambulatory Visit | Attending: Gastroenterology

## 2015-05-21 ENCOUNTER — Ambulatory Visit (HOSPITAL_COMMUNITY): Payer: Commercial Managed Care - HMO

## 2015-05-21 ENCOUNTER — Encounter (HOSPITAL_COMMUNITY): Payer: Self-pay | Admitting: *Deleted

## 2015-05-21 DIAGNOSIS — R933 Abnormal findings on diagnostic imaging of other parts of digestive tract: Secondary | ICD-10-CM | POA: Diagnosis not present

## 2015-05-21 DIAGNOSIS — Z4659 Encounter for fitting and adjustment of other gastrointestinal appliance and device: Secondary | ICD-10-CM | POA: Diagnosis not present

## 2015-05-21 DIAGNOSIS — K831 Obstruction of bile duct: Secondary | ICD-10-CM | POA: Insufficient documentation

## 2015-05-21 DIAGNOSIS — M1990 Unspecified osteoarthritis, unspecified site: Secondary | ICD-10-CM | POA: Diagnosis not present

## 2015-05-21 DIAGNOSIS — I10 Essential (primary) hypertension: Secondary | ICD-10-CM | POA: Insufficient documentation

## 2015-05-21 DIAGNOSIS — E119 Type 2 diabetes mellitus without complications: Secondary | ICD-10-CM | POA: Insufficient documentation

## 2015-05-21 DIAGNOSIS — Z87891 Personal history of nicotine dependence: Secondary | ICD-10-CM | POA: Diagnosis not present

## 2015-05-21 DIAGNOSIS — K839 Disease of biliary tract, unspecified: Secondary | ICD-10-CM

## 2015-05-21 DIAGNOSIS — Z466 Encounter for fitting and adjustment of urinary device: Secondary | ICD-10-CM | POA: Diagnosis not present

## 2015-05-21 DIAGNOSIS — Z4889 Encounter for other specified surgical aftercare: Secondary | ICD-10-CM | POA: Diagnosis not present

## 2015-05-21 DIAGNOSIS — K8050 Calculus of bile duct without cholangitis or cholecystitis without obstruction: Secondary | ICD-10-CM | POA: Diagnosis not present

## 2015-05-21 DIAGNOSIS — R932 Abnormal findings on diagnostic imaging of liver and biliary tract: Secondary | ICD-10-CM

## 2015-05-21 DIAGNOSIS — Z9689 Presence of other specified functional implants: Secondary | ICD-10-CM | POA: Diagnosis not present

## 2015-05-21 HISTORY — PX: ERCP: SHX5425

## 2015-05-21 LAB — CBC
HEMATOCRIT: 38.6 % — AB (ref 39.0–52.0)
HEMOGLOBIN: 12.8 g/dL — AB (ref 13.0–17.0)
MCH: 29 pg (ref 26.0–34.0)
MCHC: 33.2 g/dL (ref 30.0–36.0)
MCV: 87.3 fL (ref 78.0–100.0)
Platelets: 304 10*3/uL (ref 150–400)
RBC: 4.42 MIL/uL (ref 4.22–5.81)
RDW: 16.3 % — ABNORMAL HIGH (ref 11.5–15.5)
WBC: 4.7 10*3/uL (ref 4.0–10.5)

## 2015-05-21 LAB — BASIC METABOLIC PANEL
ANION GAP: 7 (ref 5–15)
BUN: 15 mg/dL (ref 6–20)
CO2: 25 mmol/L (ref 22–32)
Calcium: 9.3 mg/dL (ref 8.9–10.3)
Chloride: 107 mmol/L (ref 101–111)
Creatinine, Ser: 0.8 mg/dL (ref 0.61–1.24)
GFR calc Af Amer: >60 mL/min (ref >60)
GFR calc non Af Amer: >60 mL/min (ref >60)
GLUCOSE: 153 mg/dL — AB (ref 65–99)
Potassium: 4.2 mmol/L (ref 3.5–5.1)
Sodium: 139 mmol/L (ref 135–145)

## 2015-05-21 LAB — HEPATIC FUNCTION PANEL
ALT: 32 U/L (ref 17–63)
AST: 34 U/L (ref 15–41)
Albumin: 3.6 g/dL (ref 3.5–5.0)
Alkaline Phosphatase: 101 U/L (ref 38–126)
BILIRUBIN DIRECT: 0.2 mg/dL (ref 0.1–0.5)
BILIRUBIN INDIRECT: 0.7 mg/dL (ref 0.3–0.9)
TOTAL PROTEIN: 6.5 g/dL (ref 6.5–8.1)
Total Bilirubin: 0.9 mg/dL (ref 0.3–1.2)

## 2015-05-21 LAB — PROTIME-INR
INR: 1.04 (ref 0.00–1.49)
PROTHROMBIN TIME: 13.8 s (ref 11.6–15.2)

## 2015-05-21 SURGERY — ERCP, WITH INTERVENTION IF INDICATED
Anesthesia: General

## 2015-05-21 MED ORDER — SODIUM CHLORIDE 0.9 % IV SOLN
INTRAVENOUS | Status: DC | PRN
  Administered 2015-05-21: 30 mL

## 2015-05-21 MED ORDER — LACTATED RINGERS IV SOLN
INTRAVENOUS | Status: DC
  Administered 2015-05-21: 11:00:00 via INTRAVENOUS

## 2015-05-21 MED ORDER — SUCCINYLCHOLINE CHLORIDE 20 MG/ML IJ SOLN
INTRAMUSCULAR | Status: DC | PRN
  Administered 2015-05-21: 100 mg via INTRAVENOUS

## 2015-05-21 MED ORDER — PROPOFOL 10 MG/ML IV BOLUS
INTRAVENOUS | Status: DC | PRN
  Administered 2015-05-21: 80 mg via INTRAVENOUS

## 2015-05-21 MED ORDER — ONDANSETRON HCL 4 MG/2ML IJ SOLN: 4.0000 mg | Freq: Once | INTRAMUSCULAR | Status: DC | PRN

## 2015-05-21 MED ORDER — FENTANYL CITRATE (PF) 100 MCG/2ML IJ SOLN
INTRAMUSCULAR | Status: DC | PRN
  Administered 2015-05-21 (×2): 25 ug via INTRAVENOUS

## 2015-05-21 MED ORDER — SODIUM CHLORIDE 0.9 % IV SOLN: INTRAVENOUS | Status: DC

## 2015-05-21 MED ORDER — LIDOCAINE HCL (CARDIAC) 20 MG/ML IV SOLN
INTRAVENOUS | Status: DC | PRN
  Administered 2015-05-21: 80 mg via INTRAVENOUS

## 2015-05-21 MED ORDER — ONDANSETRON HCL 4 MG/2ML IJ SOLN
INTRAMUSCULAR | Status: DC | PRN
  Administered 2015-05-21: 4 mg via INTRAVENOUS

## 2015-05-21 NOTE — Anesthesia Postprocedure Evaluation (Signed)
  Anesthesia Post-op Note  Patient: Justin Cohen  Procedure(s) Performed: Procedure(s): ENDOSCOPIC RETROGRADE CHOLANGIOPANCREATOGRAPHY (ERCP) with spyglass and stent removal (N/A)  Patient Location: PACU  Anesthesia Type:General  Level of Consciousness: awake, alert , oriented and patient cooperative  Airway and Oxygen Therapy: Patient Spontanous Breathing  Post-op Pain: none  Post-op Assessment: Post-op Vital signs reviewed, Patient's Cardiovascular Status Stable, Respiratory Function Stable, Patent Airway, No signs of Nausea or vomiting and Pain level controlled              Post-op Vital Signs: stable  Last Vitals:  Filed Vitals:   05/21/15 1156  BP: 127/67  Pulse: 70  Temp: 36.4 C  Resp: 13    Complications: No apparent anesthesia complications

## 2015-05-21 NOTE — Discharge Instructions (Signed)
Call if question or problem or if symptoms return otherwise liquids only 4 hours and if doing well soft solids this evening   Endoscopic Retrograde Cholangiopancreatography (ERCP) Endoscopic retrograde cholangiopancreatography (ERCP) is a procedure used to diagnosis many diseases of the pancreas, bile ducts, liver, and gallbladder. During ERCP a thin, lighted tube (endoscope) is passed through the mouth and down the back of the throat into the first part of the small intestine (duodenum). A small, plastic tube (cannula) is then passed through the endoscope and directed into the bile duct or pancreatic duct. Dye is then injected through the cannula and X-rays are taken to study the biliary and pancreatic passageways.  LET [facility] CARE PROVIDER KNOW ABOUT:   Any allergies you have.   All medicines you are taking, including vitamins, herbs, eyedrops, creams, and over-the-counter medicines.   Previous problems you or members of your family have had with the use of anesthetics.   Any blood disorders you have.   Previous surgeries you have had.   Medical conditions you have. RISKS AND COMPLICATIONS Generally, ERCP is a safe procedure. However, as with any procedure, complications can occur. A simple removal of gallstones has the lowest rate of complications. Higher rates of complication occur in people who have poorly functioning bile or pancreatic ducts. Possible complications include:   Pancreatitis.  Bleeding.  Accidental punctures in the bowel wall, pancreas, or gall bladder.  Gall bladder or bile duct infection. BEFORE THE PROCEDURE   Do not eat or drink anything, including water, for at least 8 hours before the procedure or as directed by your health care provider.   Ask your health care provider whether you should stop taking certain medicines prior to your procedure.   Arrange for someone to drive you home. You will not be allowed to drive for 16-10 hours after the  procedure. PROCEDURE   You will be given medicine through a vein (intravenously) to make you relaxed and sleepy.   You might have a breathing tube placed to give you medicine that makes you sleep (general anesthetic).   Your throat may be sprayed with medicine that numbs the area and prevents gagging (local anesthetic), or you may gargle this medicine.   You will lie on your left side.   The endoscope will be inserted through your mouth and into the duodenum. The tube will not interfere with your breathing. Gagging is prevented by the anesthesia.   While X-rays are being taken, you may be positioned on your stomach.   A small sample of tissue (biopsy) may be removed for examination. AFTER THE PROCEDURE   You will rest in bed until you are fully conscious.   When you first wake up, your throat may feel slightly sore.   You will not be allowed to eat or drink until numbness subsides.   Once you are able to drink, urinate, and sit on the edge of the bed without feeling sick to your stomach (nauseous) or dizzy, you may be allowed to go home. Document Released: 07/19/2001 Document Revised: 08/14/2013 Document Reviewed: 06/04/2013 [facility] Patient Information 2015 [place], [place]. This information is not intended to replace advice given to you by your health care provider. Make sure you discuss any questions you have with your health care provider.  Monitored Anesthesia Care Monitored anesthesia care is an anesthesia service for a medical procedure. Anesthesia is the loss of the ability to feel pain. It is produced by medicines called anesthetics. It may affect a small  area of your body (local anesthesia), a large area of your body (regional anesthesia), or your entire body (general anesthesia). The need for monitored anesthesia care depends your procedure, your condition, and the potential need for regional or general anesthesia. It is often provided during procedures where:    General anesthesia may be needed if there are complications. This is because you need special care when you are under general anesthesia.   You will be under local or regional anesthesia. This is so that you are able to have higher levels of anesthesia if needed.   You will receive calming medicines (sedatives). This is especially the case if sedatives are given to put you in a semi-conscious state of relaxation (deep sedation). This is because the amount of sedative needed to produce this state can be hard to predict. Too much of a sedative can produce general anesthesia. Monitored anesthesia care is performed by one or more health care providers who have special training in all types of anesthesia. You will need to meet with these health care providers before your procedure. During this meeting, they will ask you about your medical history. They will also give you instructions to follow. (For example, you will need to stop eating and drinking before your procedure. You may also need to stop or change medicines you are taking.) During your procedure, your health care providers will stay with you. They will:   Watch your condition. This includes watching your blood pressure, breathing, and level of pain.   Diagnose and treat problems that occur.   Give medicines if they are needed. These may include calming medicines (sedatives) and anesthetics.   Make sure you are comfortable.  Having monitored anesthesia care does not necessarily mean that you will be under anesthesia. It does mean that your health care providers will be able to manage anesthesia if you need it or if it occurs. It also means that you will be able to have a different type of anesthesia than you are having if you need it. When your procedure is complete, your health care providers will continue to watch your condition. They will make sure any medicines wear off before you are allowed to go home.  Document Released:  07/20/2005 Document Revised: 03/10/2014 Document Reviewed: 12/05/2012 [facility] Patient Information 2015 [place], [place]. This information is not intended to replace advice given to you by your health care provider. Make sure you discuss any questions you have with your health care provider.

## 2015-05-21 NOTE — OR Nursing (Signed)
Pt transported to xray for 2 view abd

## 2015-05-21 NOTE — Progress Notes (Signed)
Joyce Copa 11:02 AM  Subjective: Patient without any problems since I saw him in the office and we rediscussed the procedure with he and his wife  Objective: Vital signs stable afebrile no acute distress exam please see preassessment evaluation  Assessment: Distal CBD stricture  Plan: Okay to proceed with stent removal and reevaluate stricture with ERCP and anesthesia assistance [facility] E  Pager [phone] After 5PM or if no answer call [phone]

## 2015-05-21 NOTE — Op Note (Signed)
[facility] [place], [id number]   ERCP PROCEDURE REPORT  PATIENT: Justin Cohen, Justin Cohen  MR# :[id number] BIRTHDATE: Nov 20, 1945  GENDER: male ENDOSCOPIST: Clarene Essex, MD REFERRED BY: PROCEDURE DATE:  05/21/2015 PROCEDURE:   ERCP with removal of calculus/calculi  with balloon pull-through ASA CLASS:    3 INDICATIONS: remove biliary stent and reevaluate distal stricture MEDICATIONS:    Gen. anesthesia TOPICAL ANESTHETIC:  none  DESCRIPTION OF PROCEDURE:   After the risks benefits and alternatives of the procedure were thoroughly explained, informed consent was obtained.  The Pentax Ercp Scope [id number]  endoscope was introduced through the mouth and advanced to the second portion of the duodenum .the previous sphincterotomy site was brought into view but no stent was seen and fluoroscopy higher in the liver did not reveal the stent and we went ahead and advance the sphincterotome loaded with the JAG wire and deep selective cannulation was easily obtained and the wire was advanced into the intrahepatics and I was injected which passed readily through the patent sphincterotomy site without obvious stricture and we then exchanged the sphincterotome for the adjustable 12 mm balloon and proceeded with multiple balloon pull-throughs and no debris or stones or sludge was removed and injections below the balloon did not reveal a stricture and there was excellent biliary drainage and we elected to stop the procedure at this point and the patient tolerated the procedure well Estimated blood loss is zero unless otherwise noted in this procedure report.       COMPLICATIONS: none  ENDOSCOPIC IMPRESSION:1. Migrated biliary stent 2. No pancreatic duct injection or wire advancement 3. Normal CBD with excellent biliary drainage and no signs of stricture or residual stone or debris seen  RECOMMENDATIONS:flat and upright abdomen to see if stent is in  the intestines otherwise slowly advance diet and call me when necessary and follow-up with me when necessary     _______________________________ eSigned:  Clarene Essex, MD 05/21/2015 11:53 AM   CC:

## 2015-05-21 NOTE — Anesthesia Preprocedure Evaluation (Signed)
Anesthesia Evaluation  Patient identified by MRN, date of birth, ID band Patient awake    Reviewed: Allergy & Precautions, NPO status , Patient's Chart, lab work & pertinent test results  Airway Mallampati: II       Dental   Pulmonary sleep apnea , former smoker,    + decreased breath sounds      Cardiovascular hypertension, Normal cardiovascular exam    Neuro/Psych    GI/Hepatic   Endo/Other  diabetes, Type 2, Oral Hypoglycemic AgentsMorbid obesity  Renal/GU      Musculoskeletal  (+) Arthritis -,   Abdominal   Peds  Hematology   Anesthesia Other Findings   Reproductive/Obstetrics                             Anesthesia Physical Anesthesia Plan  ASA: III  Anesthesia Plan: General   Post-op Pain Management:    Induction: Intravenous  Airway Management Planned: Oral ETT  Additional Equipment:   Intra-op Plan:   Post-operative Plan: Extubation in OR  Informed Consent: I have reviewed the patients History and Physical, chart, labs and discussed the procedure including the risks, benefits and alternatives for the proposed anesthesia with the patient or authorized representative who has indicated his/her understanding and acceptance.     Plan Discussed with: CRNA, Anesthesiologist and Surgeon  Anesthesia Plan Comments:         Anesthesia Quick Evaluation

## 2015-05-21 NOTE — Anesthesia Procedure Notes (Signed)
Procedure Name: Intubation Date/Time: 05/21/2015 11:10 AM Performed by: Clearnce Sorrel Pre-anesthesia Checklist: Patient identified, Emergency Drugs available, Suction available, Patient being monitored and Timeout performed Patient Re-evaluated:Patient Re-evaluated prior to inductionOxygen Delivery Method: Circle system utilized Preoxygenation: Pre-oxygenation with 100% oxygen Intubation Type: IV induction Laryngoscope Size: Mac and 4 Grade View: Grade I Tube type: Oral Number of attempts: 1 Airway Equipment and Method: Stylet Placement Confirmation: ETT inserted through vocal cords under direct vision,  positive ETCO2 and breath sounds checked- equal and bilateral Secured at: 23 cm Tube secured with: Tape Dental Injury: Teeth and Oropharynx as per pre-operative assessment

## 2015-05-21 NOTE — Transfer of Care (Signed)
Immediate Anesthesia Transfer of Care Note  Patient: Justin Cohen  Procedure(s) Performed: Procedure(s): ENDOSCOPIC RETROGRADE CHOLANGIOPANCREATOGRAPHY (ERCP) with spyglass and stent removal (N/A) SPYGLASS CHOLANGIOSCOPY (N/A)  Patient Location: PACU  Anesthesia Type:General  Level of Consciousness: awake, alert  and oriented  Airway & Oxygen Therapy: Patient Spontanous Breathing and Patient connected to nasal cannula oxygen  Post-op Assessment: Report given to RN and Post -op Vital signs reviewed and stable  Post vital signs: Reviewed and stable  Last Vitals:  Filed Vitals:   05/21/15 0959  BP: 140/61  Pulse: 51  Temp: 36.8 C  Resp: 17    Complications: No apparent anesthesia complications

## 2015-05-25 ENCOUNTER — Encounter (HOSPITAL_COMMUNITY): Payer: Self-pay | Admitting: Gastroenterology

## 2015-07-30 DIAGNOSIS — K429 Umbilical hernia without obstruction or gangrene: Secondary | ICD-10-CM | POA: Diagnosis not present

## 2015-07-30 DIAGNOSIS — Z96652 Presence of left artificial knee joint: Secondary | ICD-10-CM | POA: Diagnosis not present

## 2015-07-30 DIAGNOSIS — R911 Solitary pulmonary nodule: Secondary | ICD-10-CM | POA: Diagnosis not present

## 2015-07-30 DIAGNOSIS — I10 Essential (primary) hypertension: Secondary | ICD-10-CM | POA: Diagnosis not present

## 2015-07-30 DIAGNOSIS — F419 Anxiety disorder, unspecified: Secondary | ICD-10-CM | POA: Diagnosis not present

## 2015-07-30 DIAGNOSIS — E785 Hyperlipidemia, unspecified: Secondary | ICD-10-CM | POA: Diagnosis not present

## 2015-07-30 DIAGNOSIS — E119 Type 2 diabetes mellitus without complications: Secondary | ICD-10-CM | POA: Diagnosis not present

## 2015-08-03 DIAGNOSIS — H4011X1 Primary open-angle glaucoma, mild stage: Secondary | ICD-10-CM | POA: Diagnosis not present

## 2015-08-04 DIAGNOSIS — G4733 Obstructive sleep apnea (adult) (pediatric): Secondary | ICD-10-CM | POA: Diagnosis not present

## 2015-08-26 DIAGNOSIS — G4733 Obstructive sleep apnea (adult) (pediatric): Secondary | ICD-10-CM | POA: Diagnosis not present

## 2015-09-15 NOTE — Patient Outreach (Signed)
[name]) Care Management  09/15/2015  Justin Cohen 08-Apr-1946 [id number]   Referral from [facility] tier 4 list, assigned [name], RN for patient outreach.  [name], [name] Care Management Assistant

## 2015-09-21 ENCOUNTER — Other Ambulatory Visit: Payer: Self-pay

## 2015-09-21 NOTE — Patient Outreach (Signed)
[facility]) Care Management  09/21/2015  ILLIAM GUIMOND [birth year] [id number]   Telephone call to patient regarding human[employer] high risk referral.  Unable to reach patient. HIPAA compliant voice message left with call back phone number.  PLAN: RNCM will attempt 2nd telephone outreach within 3 business days.   [name] RN,BSN,CCM [place] Coordinator [phone]

## 2015-09-23 ENCOUNTER — Other Ambulatory Visit: Payer: Self-pay

## 2015-09-23 NOTE — Patient Outreach (Signed)
[facility]) Care Management  09/23/2015  HANNIBAL BROOKENS [birth year] [id number]   Second telephone call to patient regarding high risk referral.  Unable to reach patient. HIPAA compliant voice message left with call back phone number.    PLAN: RNCM will attempt third telephone outreach to patient within 3 business days.   [name] RN,BSN,CCM [place] Coordinator [phone]

## 2015-09-25 ENCOUNTER — Ambulatory Visit: Payer: Self-pay

## 2015-09-28 ENCOUNTER — Other Ambulatory Visit: Payer: Self-pay

## 2015-09-28 NOTE — Patient Outreach (Signed)
[facility]) Care Management  09/28/2015  Justin Cohen August 01, 1946 [id number]   SUBJECTIVE: Telephone call to patient regarding [employer] high risk referral.  HPAA verified with patient.  Discussed and offered [facility] care management services to patient.  Patient declined services at this time.  Patient verbally agreed to receive [facility] care management outreach letter and brochure for future needs.    PLAN: RNCM will refer patient to [place] to close due to refusal of services. RNCM will send patient [facility] care management outreach letter and brochure. RNCM will notify patients primary MD of closure.   [name] RN,BSN,CCM [place] Coordinator [phone]

## 2015-11-11 DIAGNOSIS — L570 Actinic keratosis: Secondary | ICD-10-CM | POA: Diagnosis not present

## 2015-11-11 DIAGNOSIS — Z08 Encounter for follow-up examination after completed treatment for malignant neoplasm: Secondary | ICD-10-CM | POA: Diagnosis not present

## 2015-11-11 DIAGNOSIS — Z85828 Personal history of other malignant neoplasm of skin: Secondary | ICD-10-CM | POA: Diagnosis not present

## 2016-01-07 DIAGNOSIS — K1370 Unspecified lesions of oral mucosa: Secondary | ICD-10-CM | POA: Diagnosis not present

## 2016-02-01 DIAGNOSIS — E119 Type 2 diabetes mellitus without complications: Secondary | ICD-10-CM | POA: Diagnosis not present

## 2016-02-01 DIAGNOSIS — H2513 Age-related nuclear cataract, bilateral: Secondary | ICD-10-CM | POA: Diagnosis not present

## 2016-02-01 DIAGNOSIS — H401131 Primary open-angle glaucoma, bilateral, mild stage: Secondary | ICD-10-CM | POA: Diagnosis not present

## 2016-02-09 DIAGNOSIS — Z0000 Encounter for general adult medical examination without abnormal findings: Secondary | ICD-10-CM | POA: Diagnosis not present

## 2016-02-09 DIAGNOSIS — F419 Anxiety disorder, unspecified: Secondary | ICD-10-CM | POA: Diagnosis not present

## 2016-02-09 DIAGNOSIS — Z8546 Personal history of malignant neoplasm of prostate: Secondary | ICD-10-CM | POA: Diagnosis not present

## 2016-02-09 DIAGNOSIS — Z96652 Presence of left artificial knee joint: Secondary | ICD-10-CM | POA: Diagnosis not present

## 2016-02-09 DIAGNOSIS — E785 Hyperlipidemia, unspecified: Secondary | ICD-10-CM | POA: Diagnosis not present

## 2016-02-09 DIAGNOSIS — K148 Other diseases of tongue: Secondary | ICD-10-CM | POA: Diagnosis not present

## 2016-02-09 DIAGNOSIS — I10 Essential (primary) hypertension: Secondary | ICD-10-CM | POA: Diagnosis not present

## 2016-02-09 DIAGNOSIS — E119 Type 2 diabetes mellitus without complications: Secondary | ICD-10-CM | POA: Diagnosis not present

## 2016-02-09 DIAGNOSIS — R911 Solitary pulmonary nodule: Secondary | ICD-10-CM | POA: Diagnosis not present

## 2016-03-02 DIAGNOSIS — K1321 Leukoplakia of oral mucosa, including tongue: Secondary | ICD-10-CM | POA: Diagnosis not present

## 2016-03-03 DIAGNOSIS — K1321 Leukoplakia of oral mucosa, including tongue: Secondary | ICD-10-CM | POA: Insufficient documentation

## 2016-03-13 IMAGING — RF DG ERCP WO/W SPHINCTEROTOMY
1 series · 9 of 9 positions shown · non-contrast
Comparison: None.

CLINICAL DATA: Cholangitis, biliary dilatation, and
choledocholithiasis.

EXAM:
ERCP
TECHNIQUE: Multiple spot images obtained with the fluoroscopic device and
submitted for interpretation post-procedure.

[Series 1: run · 9 of 9 slices shown]
[im 1/9]
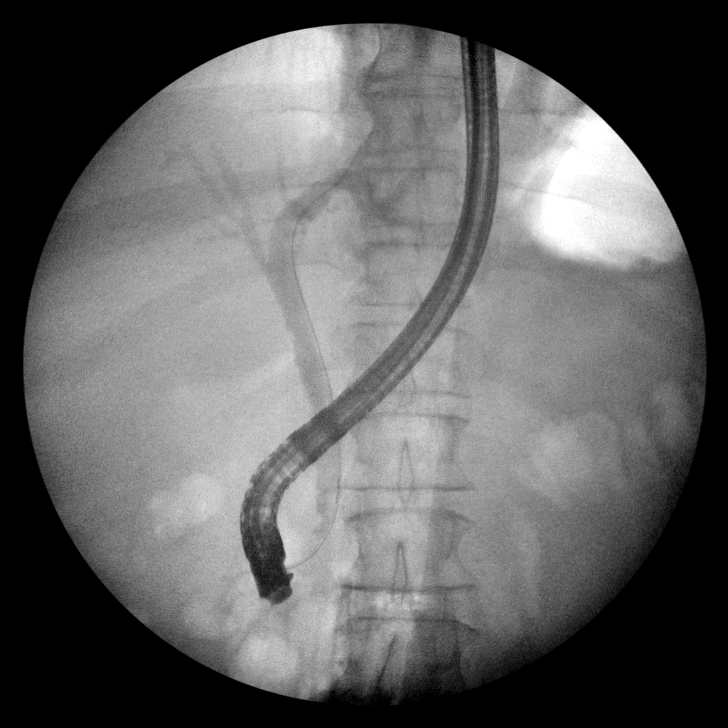
[im 2/9]
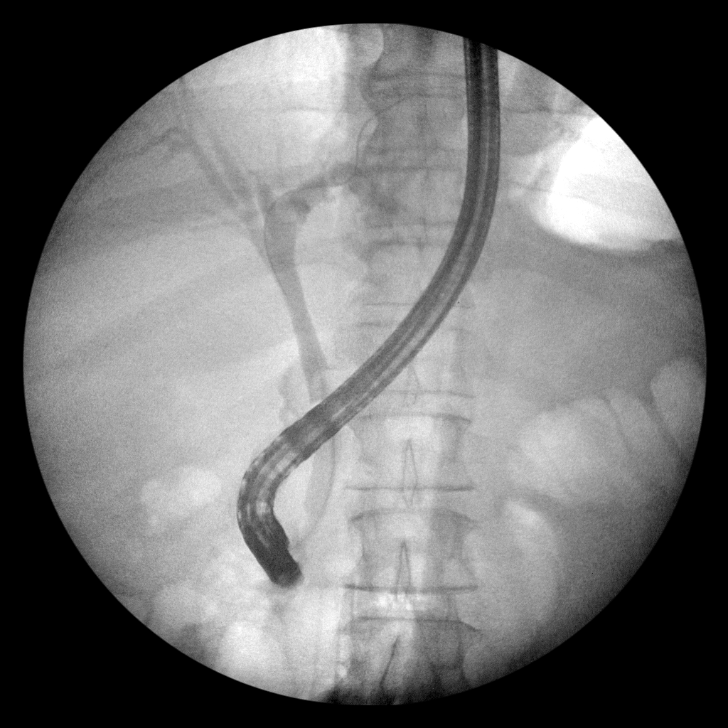
[im 3/9]
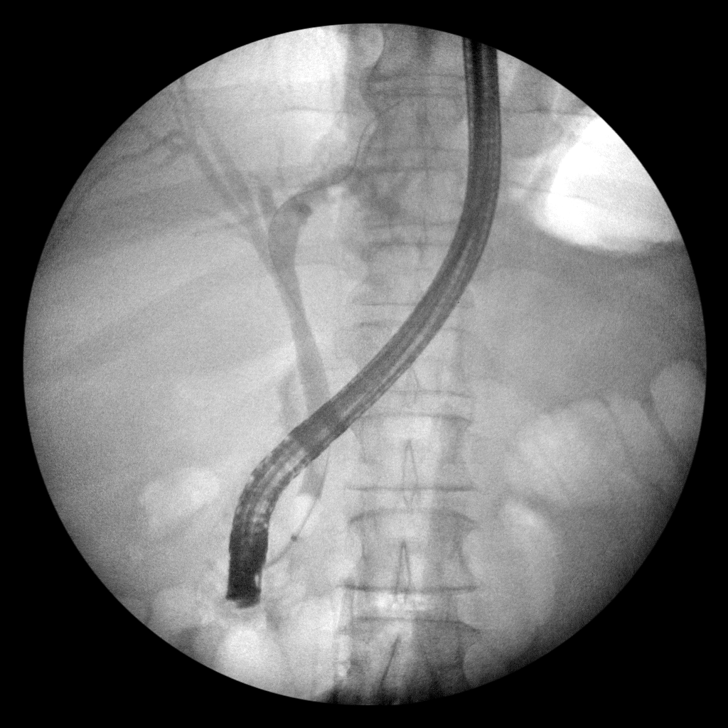
[im 4/9]
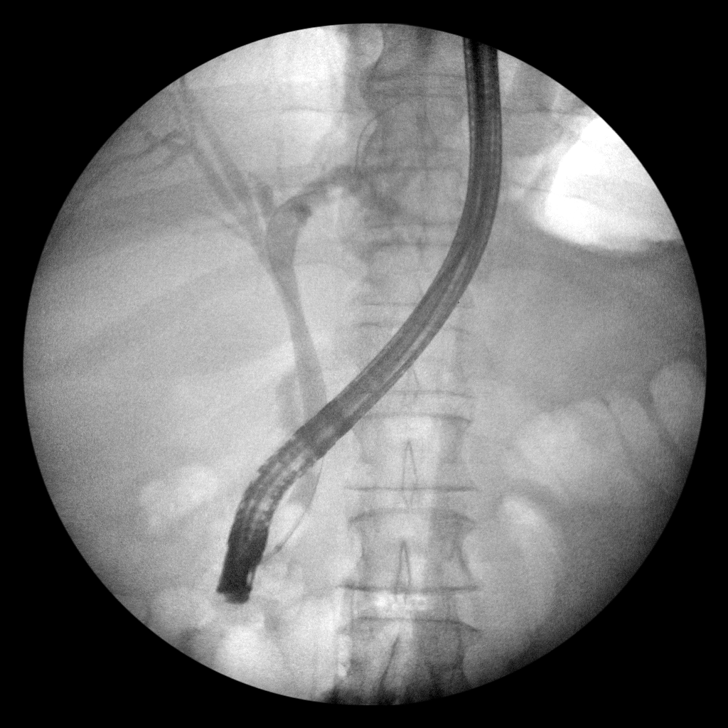
[im 5/9]
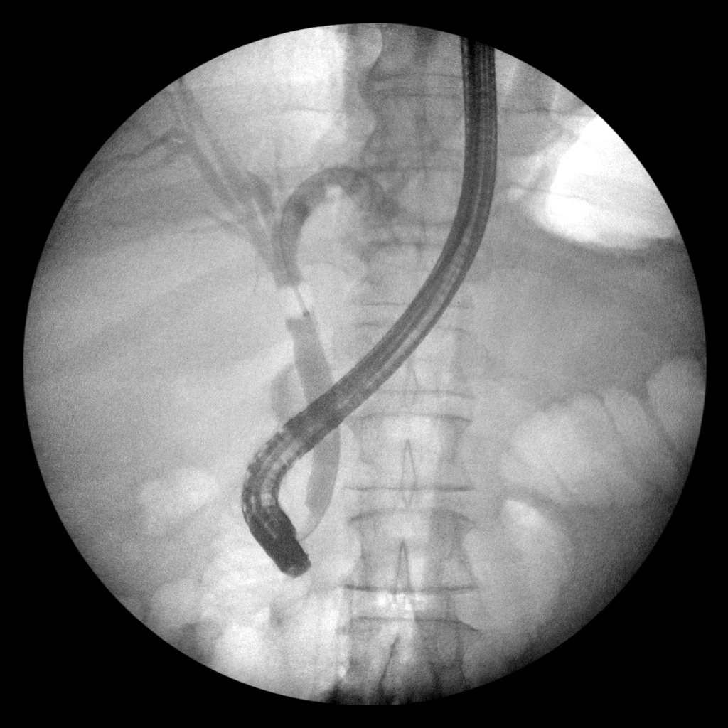
[im 6/9]
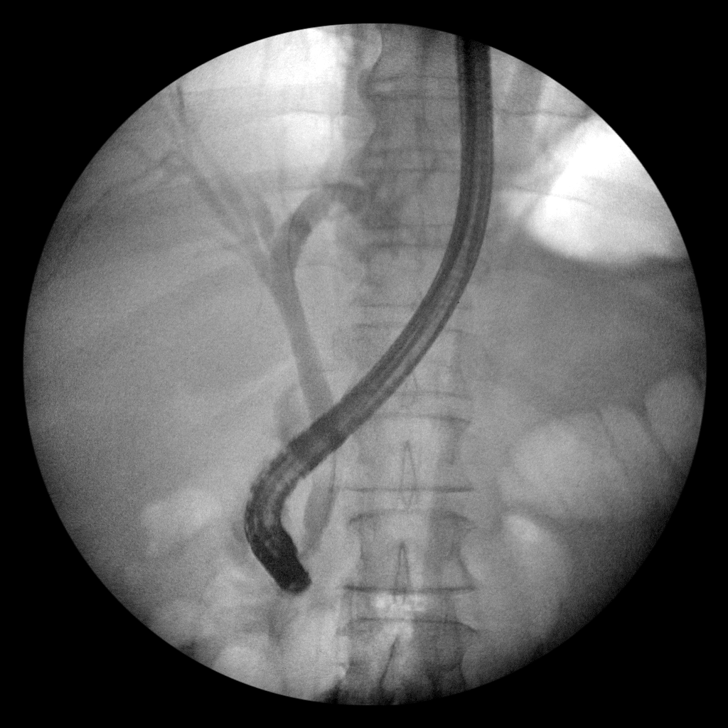
[im 7/9]
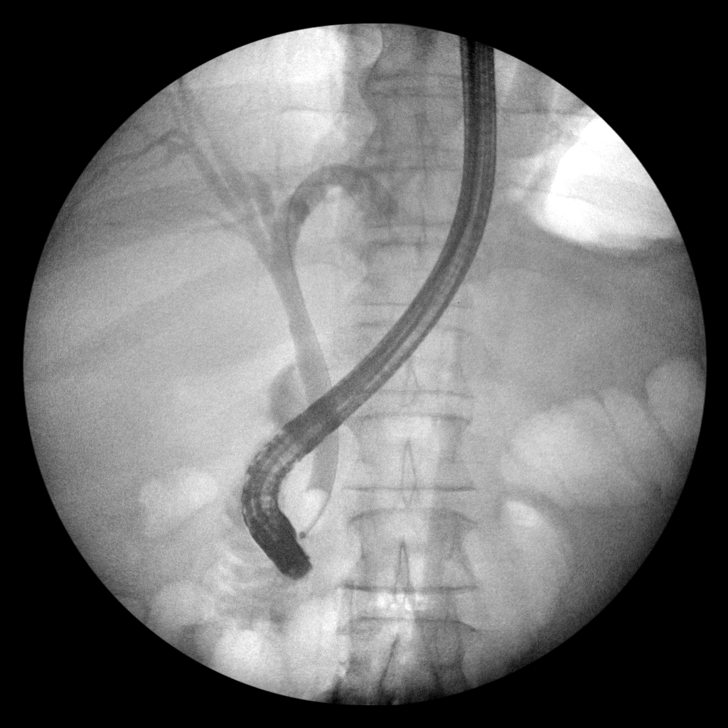
[im 8/9]
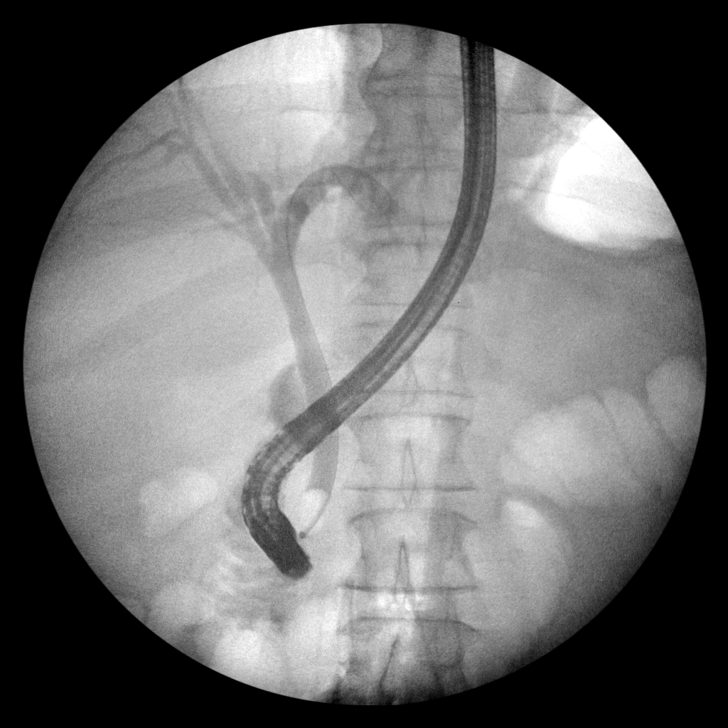
[im 9/9]
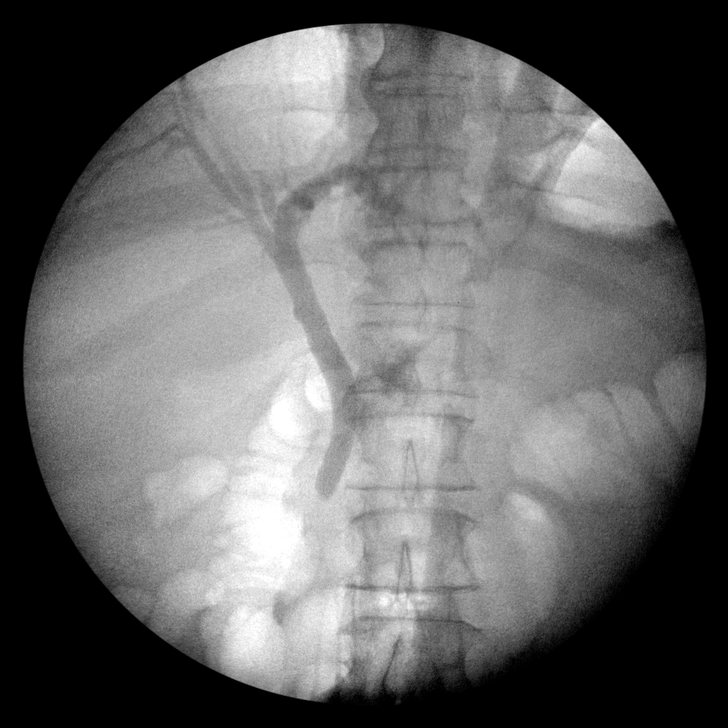

[9 of 9 positions shown; findings below may reference images not displayed]

FINDINGS: Contrast injection into the distal common bile duct shows mild
diffuse biliary ductal dilatation, as well as several irregular
filling defects within the distal common bile duct, consistent with
choledocholithiasis.

Subsequent images show passage of a balloon extraction catheter.
Final images show no definite residual filling defects within the
common bile duct.
IMPRESSION: Mild biliary ductal dilatation and distal common bile duct calculi.
No definite residual biliary calculi seen following passage of
balloon extraction catheter.

These images were submitted for radiologic interpretation only.
Please see the procedural report for the amount of contrast and the
fluoroscopy time utilized.

## 2016-03-15 IMAGING — DX DG CHEST 1V
1 series · 1 of 1 positions shown · non-contrast
Comparison: March 06, 2015

CLINICAL DATA: Difficulty breathing after ERCP. History of melanoma
and prostate carcinoma

EXAM:
CHEST  1 VIEW

[chest ap]
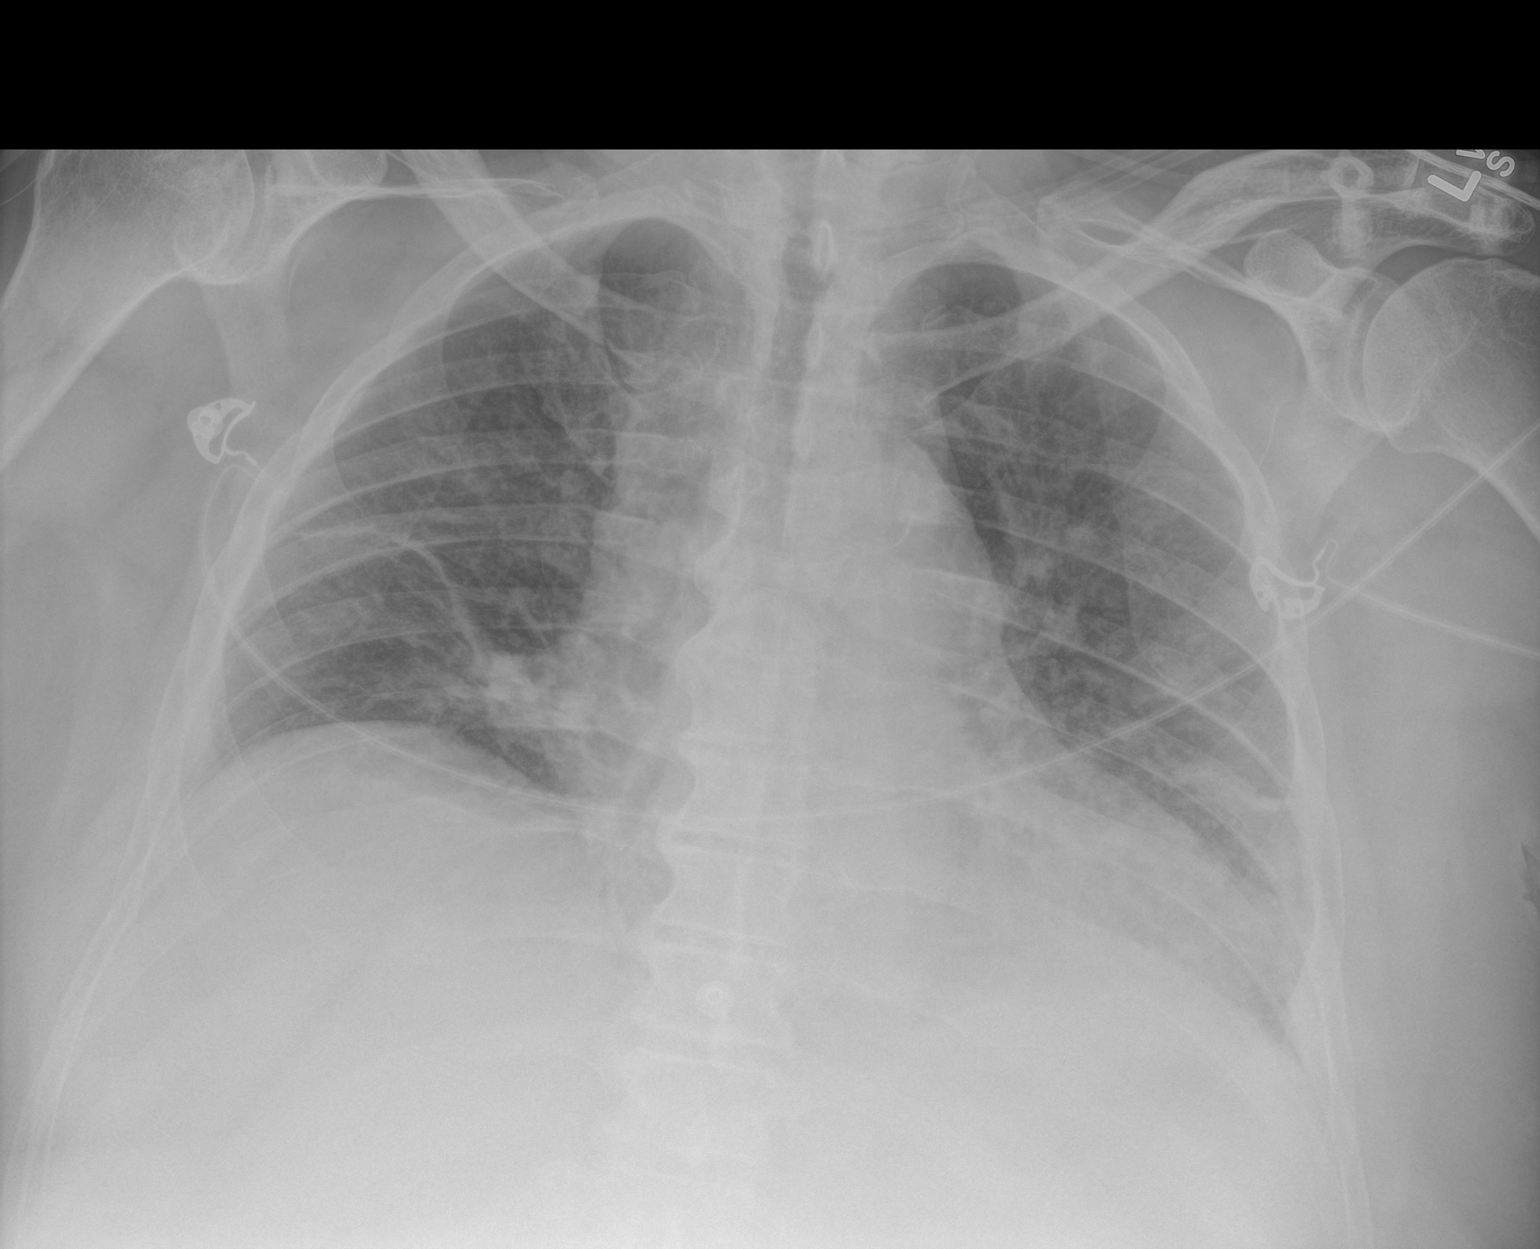

[1 of 1 positions shown; findings below may reference images not displayed]

FINDINGS: There is atelectatic change in both lower lobes. There is no edema
or consolidation. Heart is mildly enlarged with pulmonary
vascularity within normal limits. No adenopathy. There is
degenerative change in the thoracic spine. There are no blastic or
lytic bone lesions apparent.
IMPRESSION: Atelectatic change in both lower lobes. Elsewhere lungs clear. Heart
prominent but stable.

## 2016-03-15 IMAGING — RF DG ERCP WO/W SPHINCTEROTOMY
1 series · 6 of 6 positions shown · non-contrast
Comparison: 03/08/2015

CLINICAL DATA: Jaundice.

EXAM:
ERCP
TECHNIQUE: Multiple spot images obtained with the fluoroscopic device and
submitted for interpretation post-procedure.
FLUOROSCOPY TIME:  Fluoroscopy Time:  6 minutes and 18 seconds
Number of Acquired Images:  6

[Series 1: run · 6 of 6 slices shown]
[im 1/6]
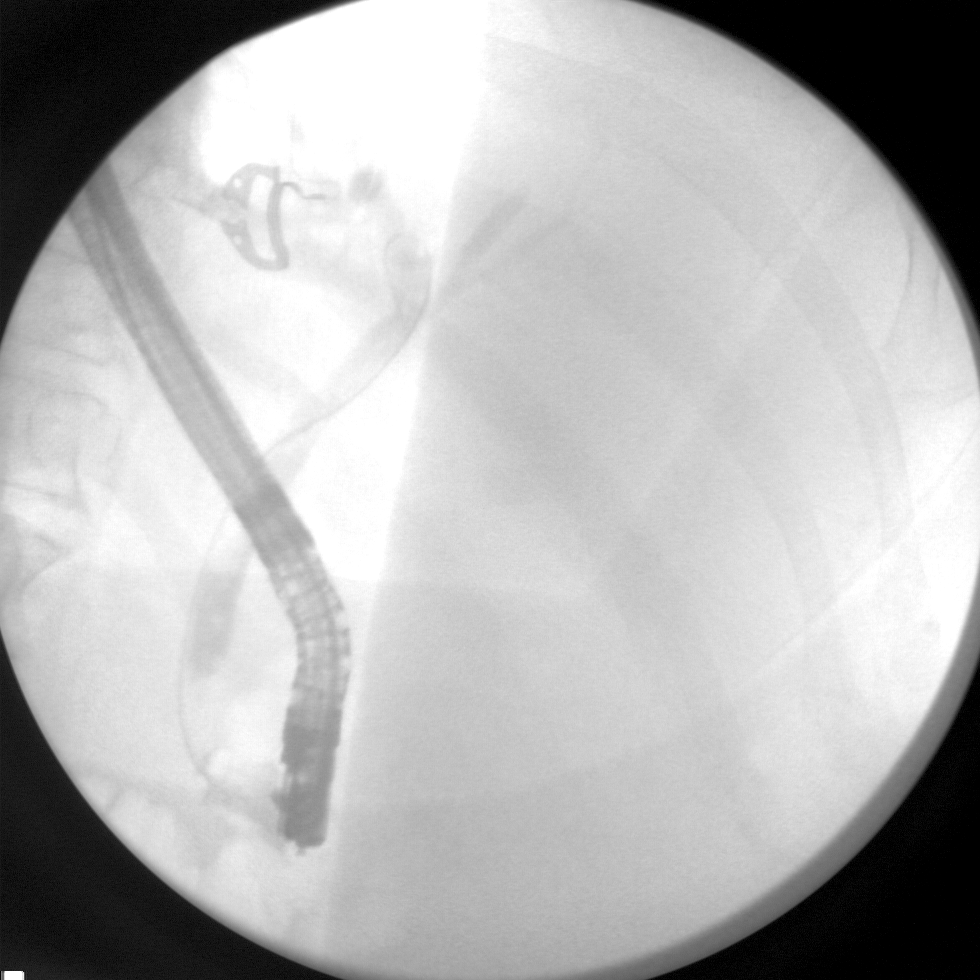
[im 2/6]
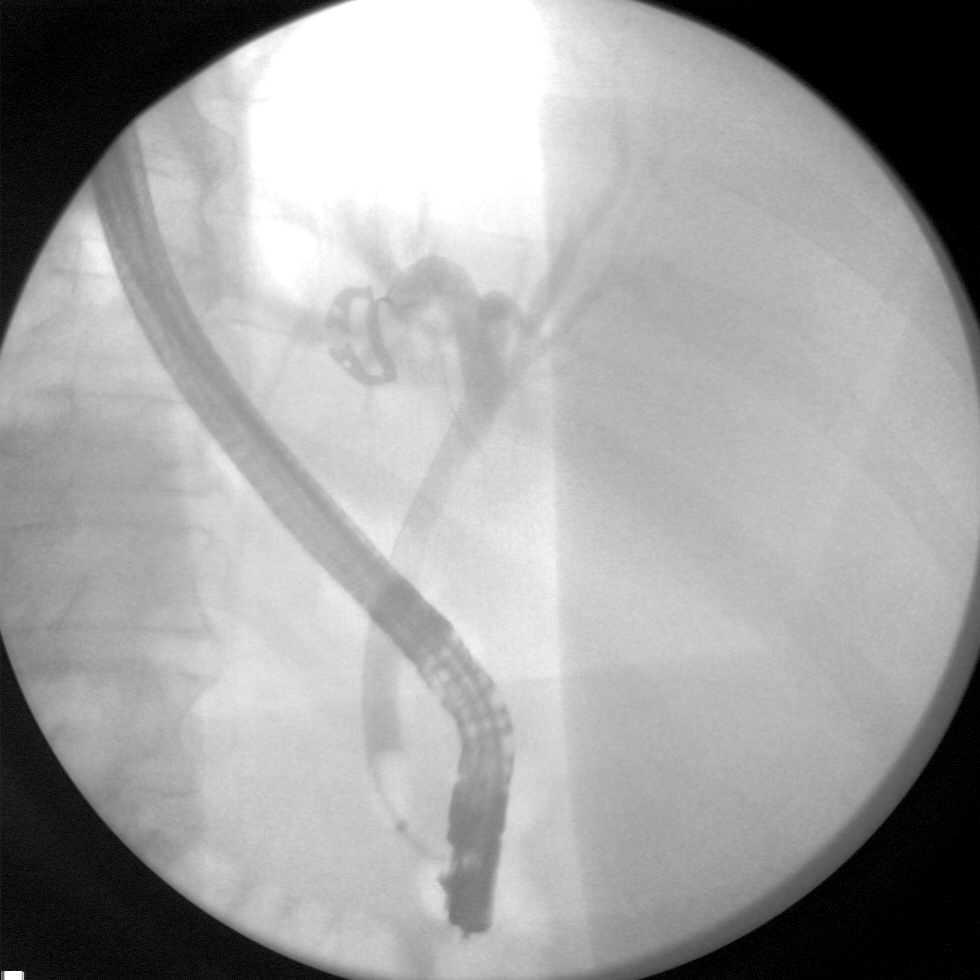
[im 3/6]
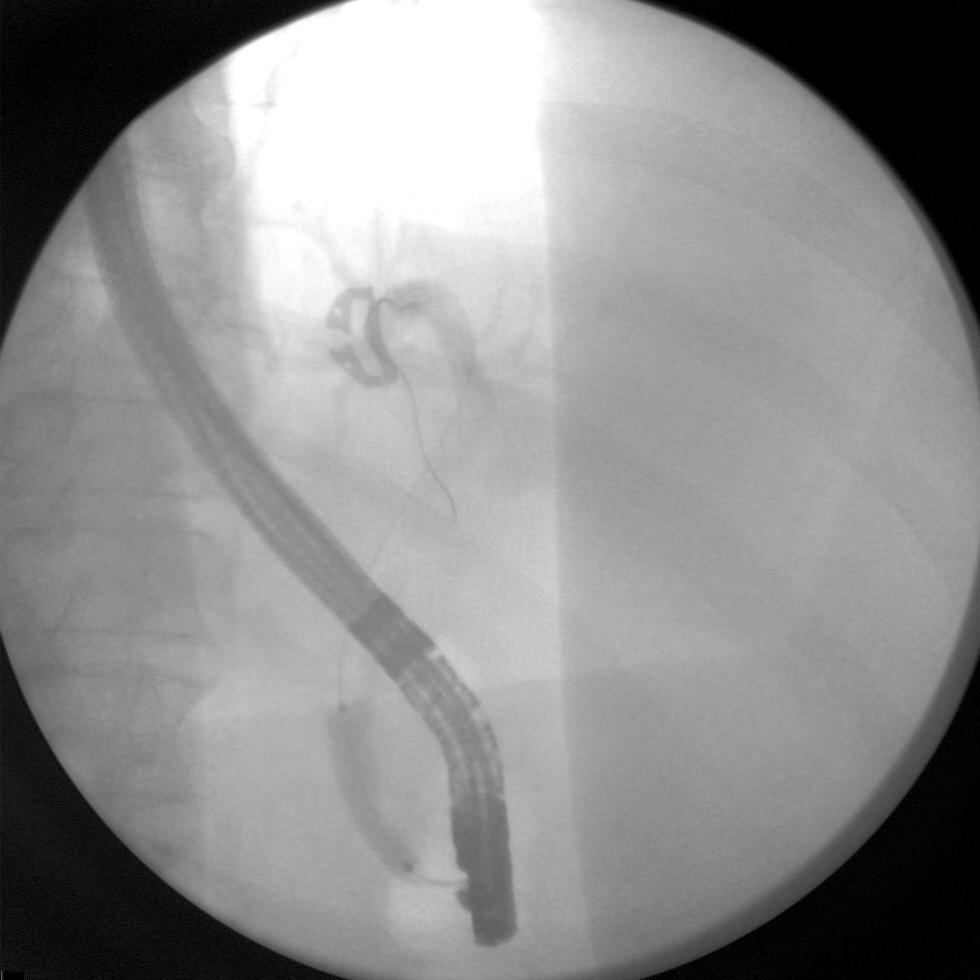
[im 4/6]
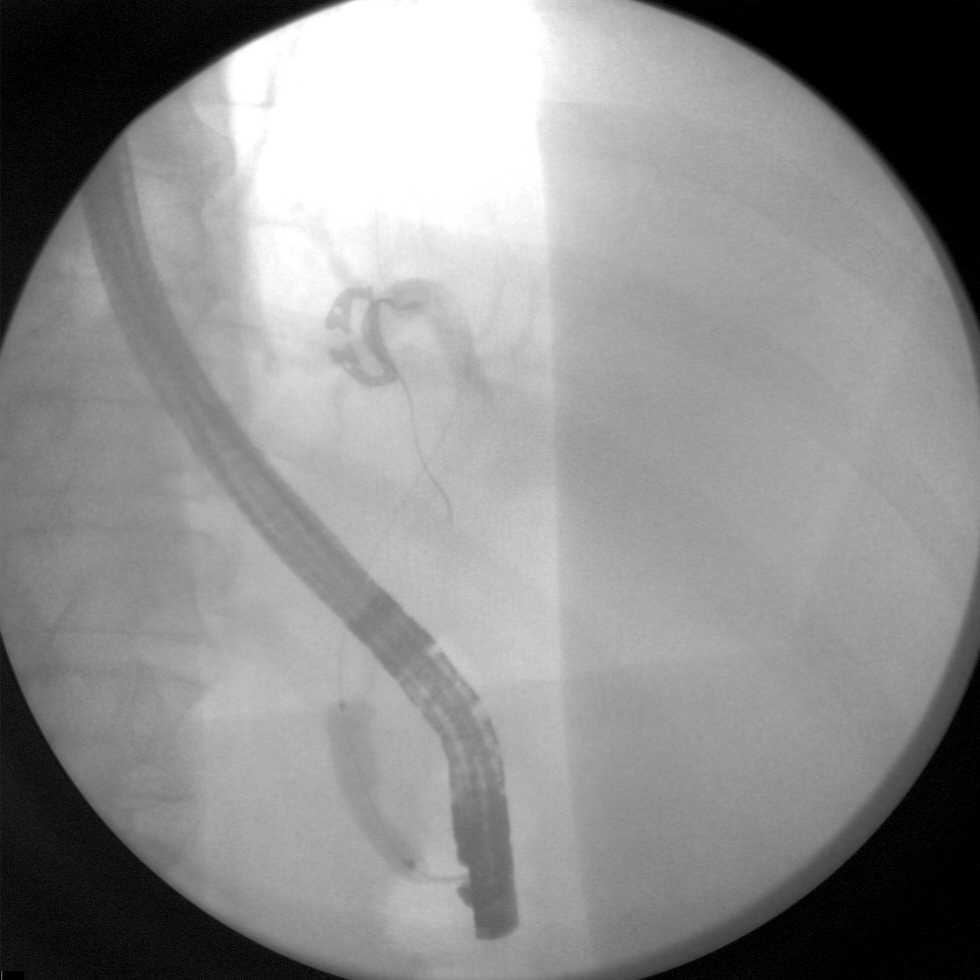
[im 5/6]
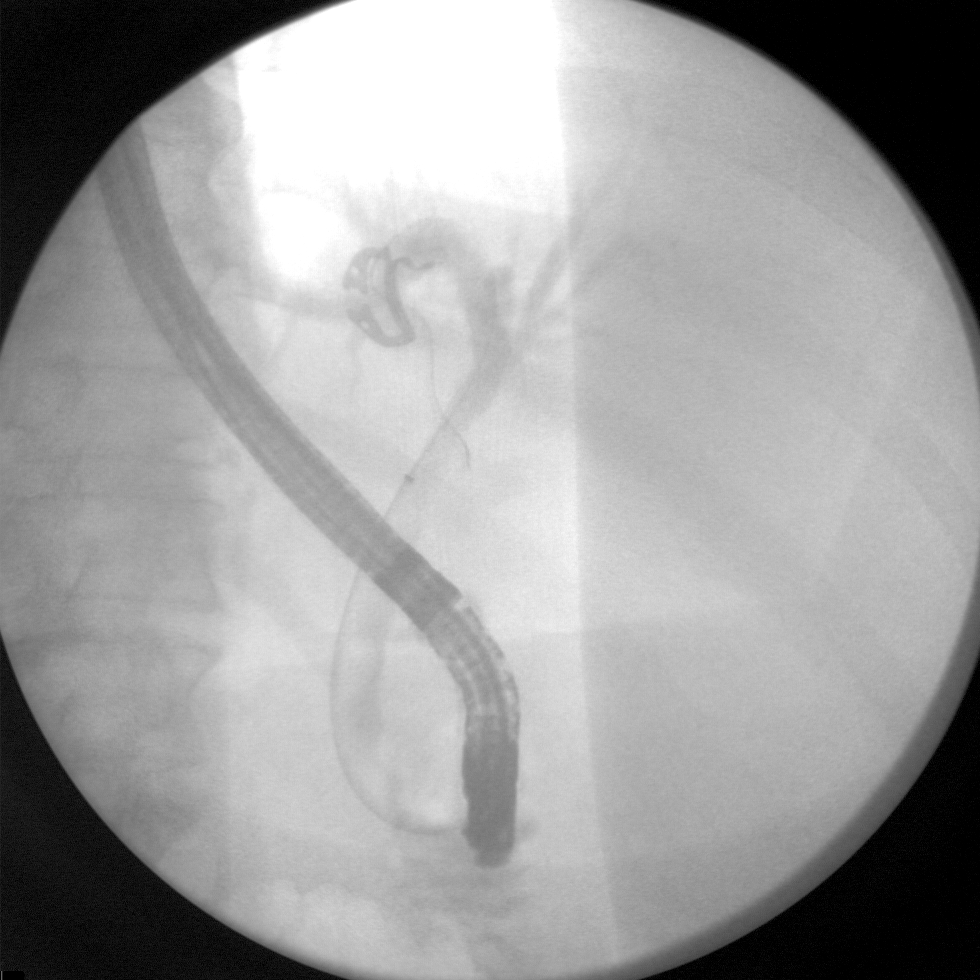
[im 6/6]
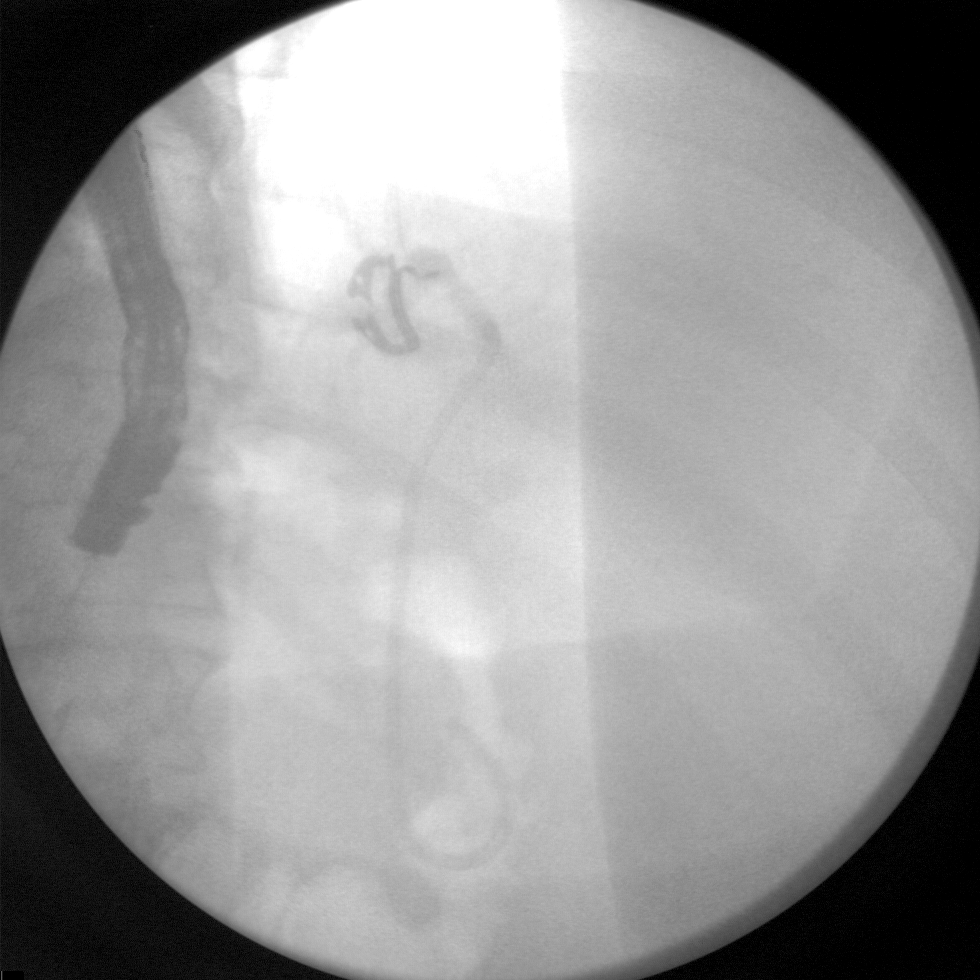

[6 of 6 positions shown; findings below may reference images not displayed]

FINDINGS: Filling defects in the common bile duct are compatible with stones.
Opacification of the extrahepatic biliary system and the central
intrahepatic bile ducts. Evidence of a balloon pull-through for
stone extraction. In addition, evidence for balloon dilatation of
the distal common bile duct. Placement of a non-metallic biliary
stent.
IMPRESSION: Stone extraction and stent placement.

These images were submitted for radiologic interpretation only.
Please see the procedural report for the amount of contrast and the
fluoroscopy time utilized.

## 2016-03-16 IMAGING — CR DG ABDOMEN 1V
1 series · 2 of 2 positions shown · non-contrast
Comparison: CT 03/06/2015.

CLINICAL DATA: Abdominal pain.

EXAM:
ABDOMEN - 1 VIEW

[Series 1: ap (kub) · U · 2 of 2 slices shown]
[im 1/2]
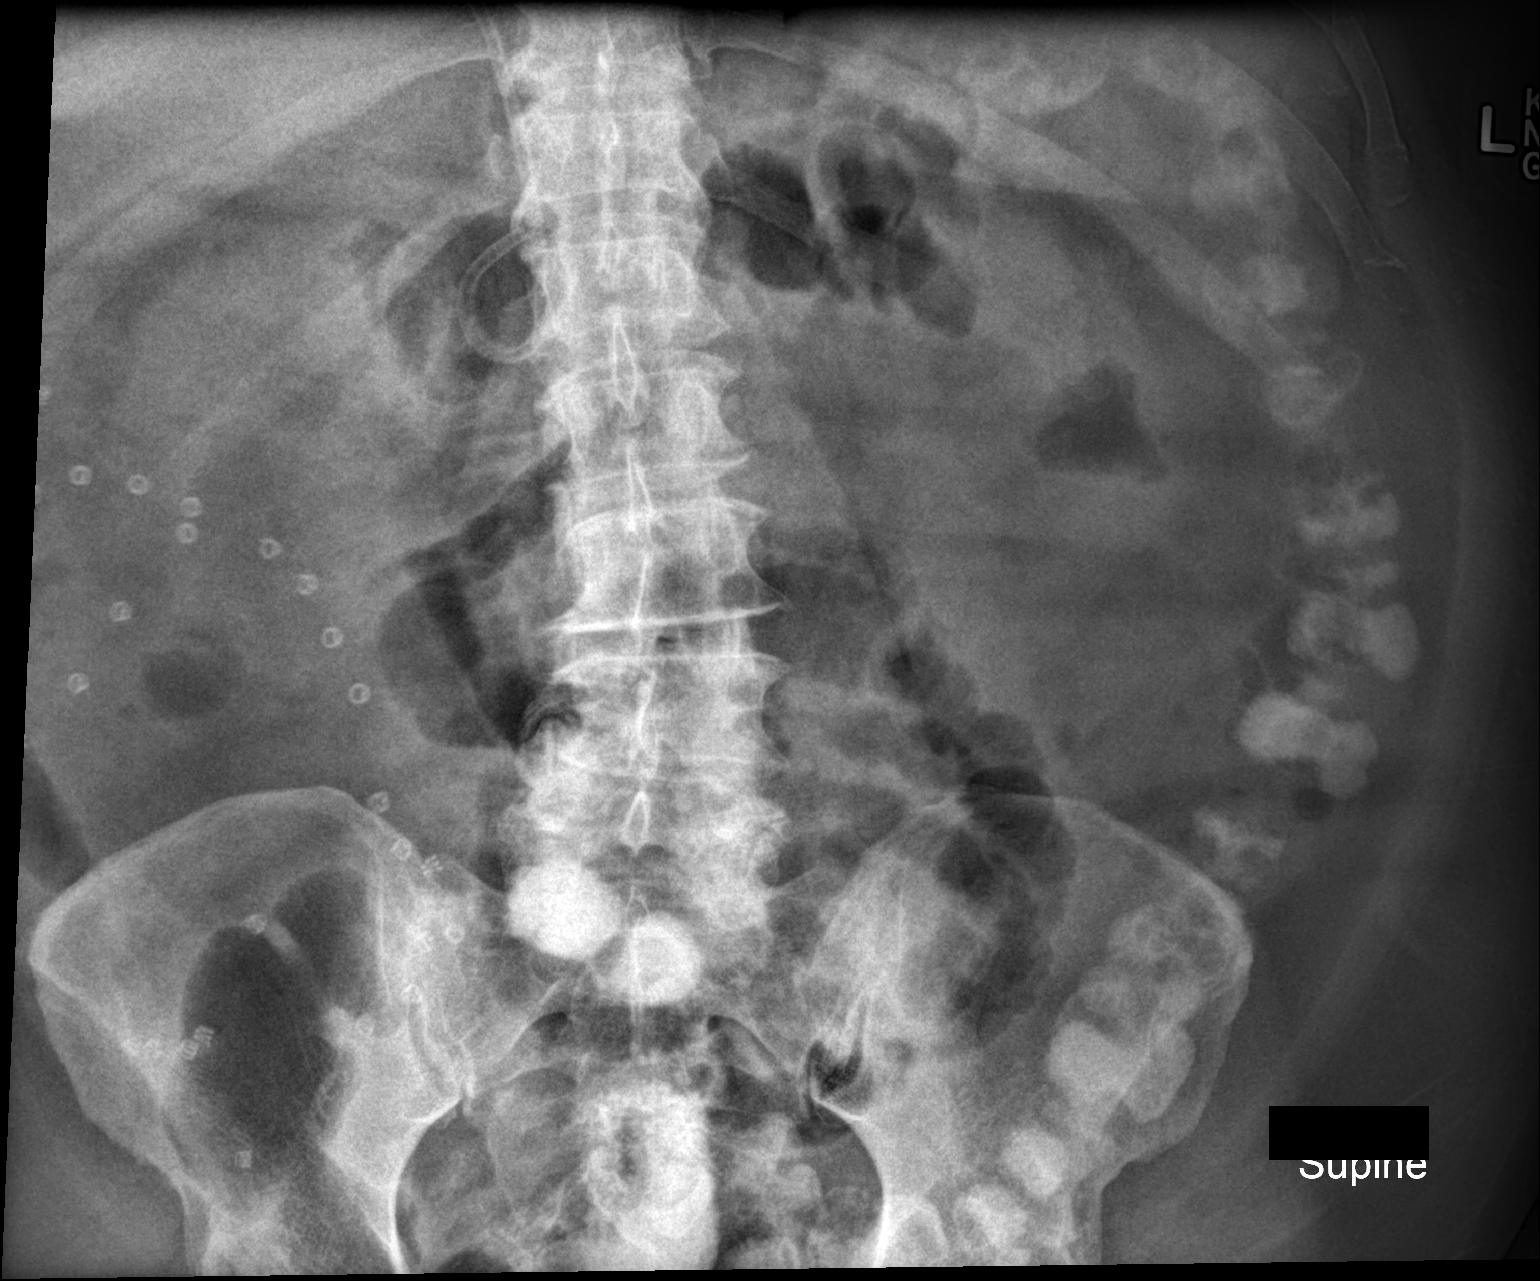
[im 2/2]
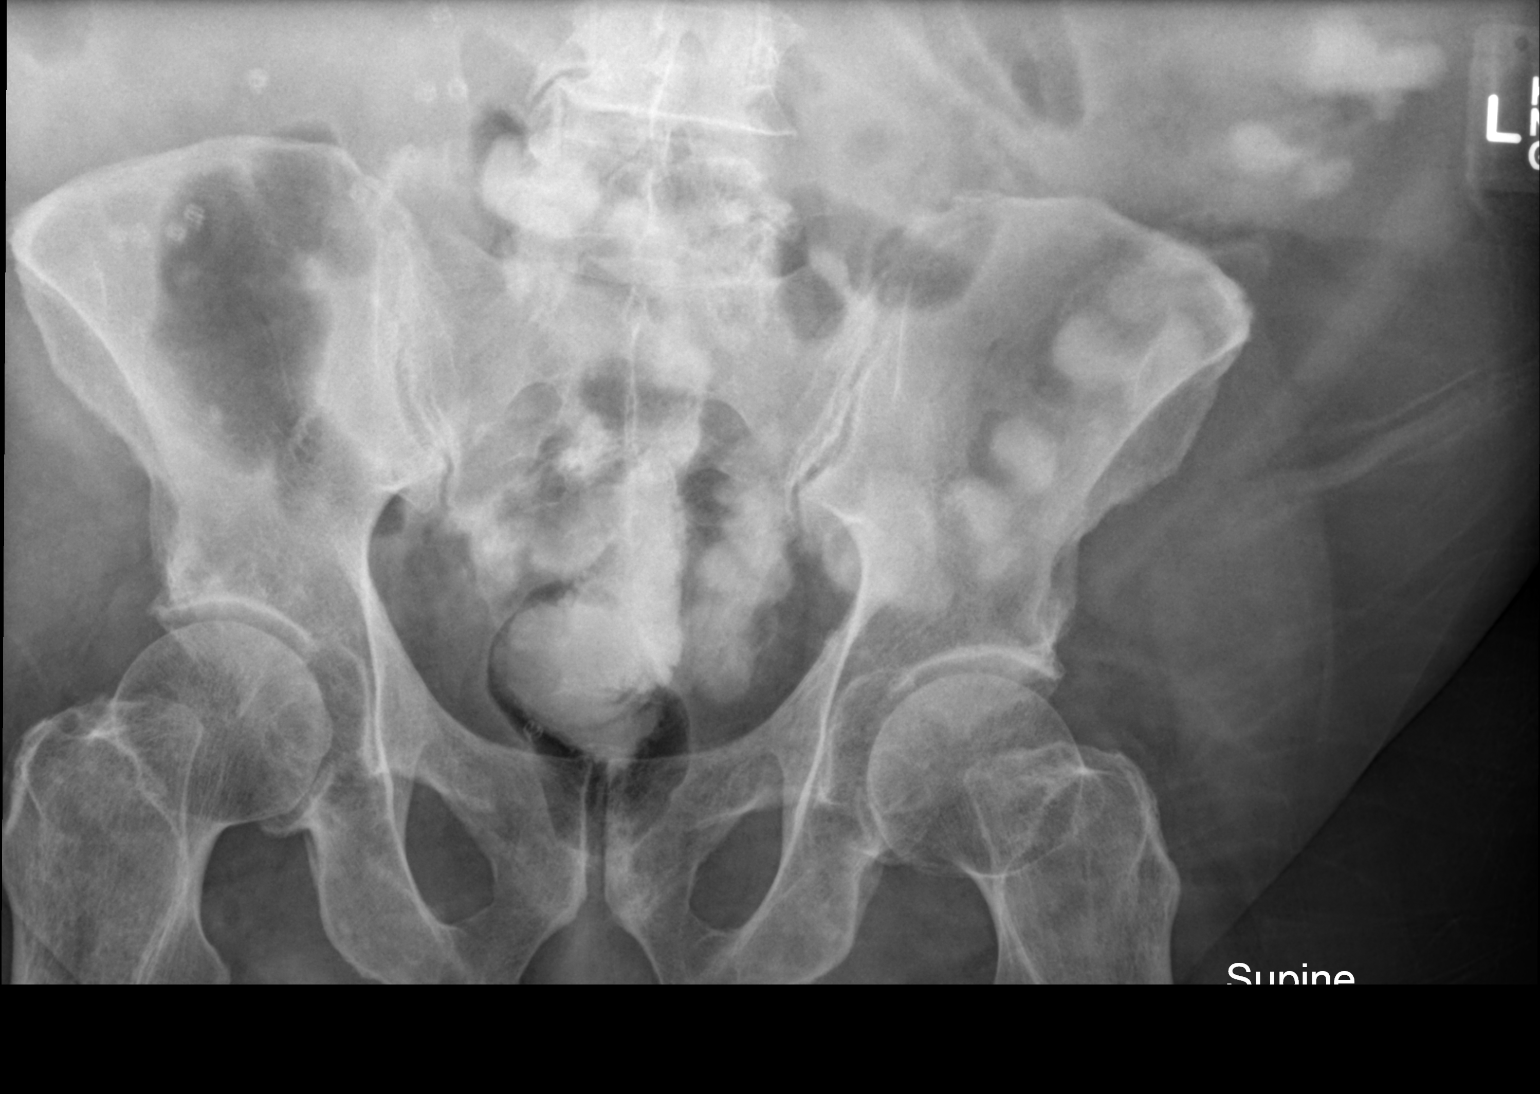

[2 of 2 positions shown; findings below may reference images not displayed]

FINDINGS: Biliary catheter not right. Surgical clips of the right. Minimally
distended air-filled loops of small bowel. Colonic gas pattern
unremarkable. Oral contrast in the colon . Mild adynamic ileus
cannot be completely excluded. Degenerative changes lumbar spine .
IMPRESSION: 1. Biliary drainage catheter noted over the right upper quadrant.
2. Air-filled loops of small and large bowel noted. Minimal
distention. Oral contrast in the colon. Mild adynamic ileus cannot
be excluded P

## 2016-05-03 DIAGNOSIS — G4733 Obstructive sleep apnea (adult) (pediatric): Secondary | ICD-10-CM | POA: Diagnosis not present

## 2016-06-28 ENCOUNTER — Other Ambulatory Visit: Payer: Self-pay | Admitting: Family Medicine

## 2016-06-28 DIAGNOSIS — M4806 Spinal stenosis, lumbar region: Secondary | ICD-10-CM | POA: Diagnosis not present

## 2016-06-28 DIAGNOSIS — M48061 Spinal stenosis, lumbar region without neurogenic claudication: Secondary | ICD-10-CM

## 2016-07-08 ENCOUNTER — Ambulatory Visit
Admission: RE | Admit: 2016-07-08 | Discharge: 2016-07-08 | Disposition: A | Discharge status: Home / Self Care | Payer: Commercial Managed Care - HMO | Source: Ambulatory Visit | Attending: Family Medicine | Admitting: Family Medicine

## 2016-07-08 DIAGNOSIS — M4806 Spinal stenosis, lumbar region: Secondary | ICD-10-CM | POA: Diagnosis not present

## 2016-07-08 DIAGNOSIS — M48061 Spinal stenosis, lumbar region without neurogenic claudication: Secondary | ICD-10-CM

## 2016-07-18 DIAGNOSIS — M4806 Spinal stenosis, lumbar region: Secondary | ICD-10-CM | POA: Diagnosis not present

## 2016-07-18 DIAGNOSIS — M549 Dorsalgia, unspecified: Secondary | ICD-10-CM | POA: Diagnosis not present

## 2016-07-18 DIAGNOSIS — I10 Essential (primary) hypertension: Secondary | ICD-10-CM | POA: Diagnosis not present

## 2016-07-18 DIAGNOSIS — Z6841 Body Mass Index (BMI) 40.0 and over, adult: Secondary | ICD-10-CM | POA: Diagnosis not present

## 2016-07-19 ENCOUNTER — Ambulatory Visit: Payer: Commercial Managed Care - HMO | Admitting: Physical Therapy

## 2016-07-27 DIAGNOSIS — M4806 Spinal stenosis, lumbar region: Secondary | ICD-10-CM | POA: Diagnosis not present

## 2016-07-27 DIAGNOSIS — G5702 Lesion of sciatic nerve, left lower limb: Secondary | ICD-10-CM | POA: Diagnosis not present

## 2016-07-27 DIAGNOSIS — M545 Low back pain: Secondary | ICD-10-CM | POA: Diagnosis not present

## 2016-08-01 DIAGNOSIS — H401131 Primary open-angle glaucoma, bilateral, mild stage: Secondary | ICD-10-CM | POA: Diagnosis not present

## 2016-08-02 DIAGNOSIS — G5702 Lesion of sciatic nerve, left lower limb: Secondary | ICD-10-CM | POA: Diagnosis not present

## 2016-08-02 DIAGNOSIS — M545 Low back pain: Secondary | ICD-10-CM | POA: Diagnosis not present

## 2016-08-02 DIAGNOSIS — M4806 Spinal stenosis, lumbar region: Secondary | ICD-10-CM | POA: Diagnosis not present

## 2016-08-05 DIAGNOSIS — G5702 Lesion of sciatic nerve, left lower limb: Secondary | ICD-10-CM | POA: Diagnosis not present

## 2016-08-05 DIAGNOSIS — M4806 Spinal stenosis, lumbar region: Secondary | ICD-10-CM | POA: Diagnosis not present

## 2016-08-05 DIAGNOSIS — M545 Low back pain: Secondary | ICD-10-CM | POA: Diagnosis not present

## 2016-08-09 DIAGNOSIS — G5702 Lesion of sciatic nerve, left lower limb: Secondary | ICD-10-CM | POA: Diagnosis not present

## 2016-08-09 DIAGNOSIS — M545 Low back pain: Secondary | ICD-10-CM | POA: Diagnosis not present

## 2016-08-09 DIAGNOSIS — M48061 Spinal stenosis, lumbar region without neurogenic claudication: Secondary | ICD-10-CM | POA: Diagnosis not present

## 2016-08-11 DIAGNOSIS — M48061 Spinal stenosis, lumbar region without neurogenic claudication: Secondary | ICD-10-CM | POA: Diagnosis not present

## 2016-08-11 DIAGNOSIS — G5702 Lesion of sciatic nerve, left lower limb: Secondary | ICD-10-CM | POA: Diagnosis not present

## 2016-08-11 DIAGNOSIS — M545 Low back pain: Secondary | ICD-10-CM | POA: Diagnosis not present

## 2016-08-12 DIAGNOSIS — F419 Anxiety disorder, unspecified: Secondary | ICD-10-CM | POA: Diagnosis not present

## 2016-08-12 DIAGNOSIS — Z23 Encounter for immunization: Secondary | ICD-10-CM | POA: Diagnosis not present

## 2016-08-12 DIAGNOSIS — M19041 Primary osteoarthritis, right hand: Secondary | ICD-10-CM | POA: Diagnosis not present

## 2016-08-12 DIAGNOSIS — E785 Hyperlipidemia, unspecified: Secondary | ICD-10-CM | POA: Diagnosis not present

## 2016-08-12 DIAGNOSIS — I10 Essential (primary) hypertension: Secondary | ICD-10-CM | POA: Diagnosis not present

## 2016-08-12 DIAGNOSIS — E119 Type 2 diabetes mellitus without complications: Secondary | ICD-10-CM | POA: Diagnosis not present

## 2016-08-12 DIAGNOSIS — M48061 Spinal stenosis, lumbar region without neurogenic claudication: Secondary | ICD-10-CM | POA: Diagnosis not present

## 2016-08-12 DIAGNOSIS — R911 Solitary pulmonary nodule: Secondary | ICD-10-CM | POA: Diagnosis not present

## 2016-08-12 DIAGNOSIS — M25512 Pain in left shoulder: Secondary | ICD-10-CM | POA: Diagnosis not present

## 2016-08-15 DIAGNOSIS — G5702 Lesion of sciatic nerve, left lower limb: Secondary | ICD-10-CM | POA: Diagnosis not present

## 2016-08-15 DIAGNOSIS — M545 Low back pain: Secondary | ICD-10-CM | POA: Diagnosis not present

## 2016-08-15 DIAGNOSIS — M48061 Spinal stenosis, lumbar region without neurogenic claudication: Secondary | ICD-10-CM | POA: Diagnosis not present

## 2016-08-16 DIAGNOSIS — M7542 Impingement syndrome of left shoulder: Secondary | ICD-10-CM | POA: Diagnosis not present

## 2016-08-16 DIAGNOSIS — M19041 Primary osteoarthritis, right hand: Secondary | ICD-10-CM | POA: Diagnosis not present

## 2016-08-17 DIAGNOSIS — M545 Low back pain: Secondary | ICD-10-CM | POA: Diagnosis not present

## 2016-08-17 DIAGNOSIS — M48061 Spinal stenosis, lumbar region without neurogenic claudication: Secondary | ICD-10-CM | POA: Diagnosis not present

## 2016-08-17 DIAGNOSIS — G5702 Lesion of sciatic nerve, left lower limb: Secondary | ICD-10-CM | POA: Diagnosis not present

## 2016-08-22 DIAGNOSIS — M7542 Impingement syndrome of left shoulder: Secondary | ICD-10-CM | POA: Diagnosis not present

## 2016-08-22 DIAGNOSIS — M25512 Pain in left shoulder: Secondary | ICD-10-CM | POA: Diagnosis not present

## 2016-08-24 DIAGNOSIS — M25512 Pain in left shoulder: Secondary | ICD-10-CM | POA: Diagnosis not present

## 2016-08-24 DIAGNOSIS — M7542 Impingement syndrome of left shoulder: Secondary | ICD-10-CM | POA: Diagnosis not present

## 2016-08-31 DIAGNOSIS — M7542 Impingement syndrome of left shoulder: Secondary | ICD-10-CM | POA: Diagnosis not present

## 2016-08-31 DIAGNOSIS — M25512 Pain in left shoulder: Secondary | ICD-10-CM | POA: Diagnosis not present

## 2016-09-02 DIAGNOSIS — M25512 Pain in left shoulder: Secondary | ICD-10-CM | POA: Diagnosis not present

## 2016-09-02 DIAGNOSIS — M7542 Impingement syndrome of left shoulder: Secondary | ICD-10-CM | POA: Diagnosis not present

## 2016-09-07 DIAGNOSIS — M25512 Pain in left shoulder: Secondary | ICD-10-CM | POA: Diagnosis not present

## 2016-09-07 DIAGNOSIS — M7542 Impingement syndrome of left shoulder: Secondary | ICD-10-CM | POA: Diagnosis not present

## 2016-09-09 DIAGNOSIS — M25512 Pain in left shoulder: Secondary | ICD-10-CM | POA: Diagnosis not present

## 2016-09-09 DIAGNOSIS — M7542 Impingement syndrome of left shoulder: Secondary | ICD-10-CM | POA: Diagnosis not present

## 2016-09-12 DIAGNOSIS — M7542 Impingement syndrome of left shoulder: Secondary | ICD-10-CM | POA: Diagnosis not present

## 2016-09-12 DIAGNOSIS — M25512 Pain in left shoulder: Secondary | ICD-10-CM | POA: Diagnosis not present

## 2016-10-07 DIAGNOSIS — M19041 Primary osteoarthritis, right hand: Secondary | ICD-10-CM | POA: Diagnosis not present

## 2016-10-07 DIAGNOSIS — M7542 Impingement syndrome of left shoulder: Secondary | ICD-10-CM | POA: Diagnosis not present

## 2016-11-11 DIAGNOSIS — L409 Psoriasis, unspecified: Secondary | ICD-10-CM | POA: Diagnosis not present

## 2016-11-11 DIAGNOSIS — M150 Primary generalized (osteo)arthritis: Secondary | ICD-10-CM | POA: Diagnosis not present

## 2016-11-11 DIAGNOSIS — M130 Polyarthritis, unspecified: Secondary | ICD-10-CM | POA: Diagnosis not present

## 2016-11-11 DIAGNOSIS — R5383 Other fatigue: Secondary | ICD-10-CM | POA: Diagnosis not present

## 2016-11-11 DIAGNOSIS — M2550 Pain in unspecified joint: Secondary | ICD-10-CM | POA: Diagnosis not present

## 2016-11-11 DIAGNOSIS — Z6841 Body Mass Index (BMI) 40.0 and over, adult: Secondary | ICD-10-CM | POA: Diagnosis not present

## 2016-12-02 DIAGNOSIS — L4050 Arthropathic psoriasis, unspecified: Secondary | ICD-10-CM | POA: Diagnosis not present

## 2016-12-02 DIAGNOSIS — M150 Primary generalized (osteo)arthritis: Secondary | ICD-10-CM | POA: Diagnosis not present

## 2016-12-02 DIAGNOSIS — M2550 Pain in unspecified joint: Secondary | ICD-10-CM | POA: Diagnosis not present

## 2016-12-02 DIAGNOSIS — Z6841 Body Mass Index (BMI) 40.0 and over, adult: Secondary | ICD-10-CM | POA: Diagnosis not present

## 2016-12-02 DIAGNOSIS — L409 Psoriasis, unspecified: Secondary | ICD-10-CM | POA: Diagnosis not present

## 2016-12-28 ENCOUNTER — Encounter: Payer: Self-pay | Admitting: Sports Medicine

## 2016-12-28 ENCOUNTER — Ambulatory Visit (INDEPENDENT_AMBULATORY_CARE_PROVIDER_SITE_OTHER): Payer: Medicare HMO | Admitting: Sports Medicine

## 2016-12-28 DIAGNOSIS — E119 Type 2 diabetes mellitus without complications: Secondary | ICD-10-CM | POA: Diagnosis not present

## 2016-12-28 DIAGNOSIS — M21619 Bunion of unspecified foot: Secondary | ICD-10-CM

## 2016-12-28 DIAGNOSIS — M2142 Flat foot [pes planus] (acquired), left foot: Secondary | ICD-10-CM | POA: Diagnosis not present

## 2016-12-28 DIAGNOSIS — M2141 Flat foot [pes planus] (acquired), right foot: Secondary | ICD-10-CM | POA: Diagnosis not present

## 2016-12-28 DIAGNOSIS — E1142 Type 2 diabetes mellitus with diabetic polyneuropathy: Secondary | ICD-10-CM

## 2016-12-28 DIAGNOSIS — L84 Corns and callosities: Secondary | ICD-10-CM

## 2016-12-28 NOTE — Progress Notes (Signed)
Subjective: Justin Cohen is a 71 y.o. male patient with history of diabetes who presents to office today for diabetic foot eval and for shoes/inserts. Patient denies any new cramping, numbness, burning or tingling in the legs.  Patient Active Problem List   Diagnosis Date Noted  . 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 Malnutrition of moderate degree ([place]) 04/02/2015  . Abscess   . Hyperlipidemia 03/31/2015  . Pericholecystic abscess 03/31/2015  . Postoperative wound abscess 03/31/2015  . Cholecystitis, acute 03/13/2015  . Sepsis due to Escherichia coli ([place]) 03/11/2015  . Choledocholithiasis 03/08/2015  . Cholangitis 03/06/2015  . HTN (hypertension) 03/06/2015  . DM w/o complication type II ([place]) 03/06/2015  . Encephalopathy due to infection 03/06/2015  . Sleep apnea 03/06/2015  . Morbid obesity ([place]) 02/18/2015  . S/P left TKA 02/17/2015   Current Outpatient Prescriptions on File Prior to Visit  Medication Sig Dispense Refill  . amLODipine-benazepril (LOTREL) 5-10 MG per capsule Take 1 capsule by mouth every morning.     Marland Kitchen aspirin 81 MG tablet Take 81 mg by mouth daily.    . cetirizine (ZYRTEC) 10 MG tablet Take 10 mg by mouth daily.    . Cholecalciferol (VITAMIN D) 2000 UNITS tablet Take 2,000 Units by mouth daily.    . dorzolamide-timolol (COSOPT) 22.3-6.8 MG/ML ophthalmic solution Place 1 drop into both eyes 2 (two) times daily.     . fluticasone (FLONASE) 50 MCG/ACT nasal spray Place 1 spray into both nostrils daily as needed for allergies (allergies).     Marland Kitchen glipiZIDE (GLUCOTROL) 5 MG tablet Take 5 mg by mouth every morning.    . lansoprazole (PREVACID) 15 MG capsule Take 15 mg by mouth every morning.     . metFORMIN (GLUCOPHAGE) 1000 MG tablet Take 1,000 mg by mouth 2 (two) times daily with a meal.    . Multiple Vitamin (MULTIVITAMIN WITH MINERALS) TABS tablet Take 1 tablet by mouth every morning.    . Omega-3 Fatty Acids (FISH OIL) 1000 MG CAPS Take 1,000 mg by mouth  every morning.    . pioglitazone (ACTOS) 45 MG tablet Take 45 mg by mouth every morning.    Marland Kitchen PREVACID 15 MG capsule Take 15 mg by mouth every morning.    . Probiotic Product (PROBIOTIC DAILY PO) Take 1 capsule by mouth daily.    Marland Kitchen saccharomyces boulardii (FLORASTOR) 250 MG capsule Take 1 capsule (250 mg total) by mouth 2 (two) times daily. 60 capsule 0  . venlafaxine XR (EFFEXOR-XR) 75 MG 24 hr capsule Take 75 mg by mouth every morning.     No current facility-administered medications on file prior to visit.    No Known Allergies  No results found for this or any previous visit (from the past 2160 hour(s)).  Objective: General: Patient is awake, alert, and oriented x 3 and in no acute distress.  Integument: Skin is warm, dry and supple bilateral. Nails are short thickened and dystrophic with subungual debris, consistent with onychomycosis, 1-5 bilateral. No signs of infection. No open lesions or preulcerative lesions present bilateral. Mild reactive callus at 1st toes. Remaining integument unremarkable.  Vasculature:  Dorsalis Pedis pulse 2/4 bilateral. Posterior Tibial pulse 1 /4 bilateral.  Capillary fill time <3 sec 1-5 bilateral. Positive hair growth to the level of the digits. Temperature gradient within normal limits. No varicosities present bilateral. No edema present bilateral.   Neurology: The patient has intact sensation measured with a 5.07/10g Semmes Weinstein Monofilament at all pedal sites bilateral .  Vibratory sensation diminished bilateral with tuning fork. No Babinski sign present bilateral.   Musculoskeletal: Asymptomatic bunion L>R and pes planus pedal deformities noted bilateral. Muscular strength 5/5 in all lower extremity muscular groups bilateral without pain on range of motion . No tenderness with calf compression bilateral.  Assessment and Plan: Problem List Items Addressed This Visit    None    Visit Diagnoses    Diabetic polyneuropathy associated with type 2  diabetes mellitus ([place])    -  Primary   Relevant Medications   ASPIRIN LOW DOSE PO   rosuvastatin (CRESTOR) 20 MG tablet   Bunion       Pes planus of both feet       Comprehensive diabetic foot examination, type 2 DM, encounter for [facility])       Relevant Medications   ASPIRIN LOW DOSE PO   rosuvastatin (CRESTOR) 20 MG tablet   Callus of foot          -Examined patient. -Discussed and educated patient on diabetic foot care, especially with  regards to the vascular, neurological and musculoskeletal systems.  -Stressed the importance of good glycemic control and the detriment of not controlling glucose levels in relation to the foot. -Safe step diabetic shoe order form was completed; office to contact primary care for approval / certification;  Office to arrange shoe fitting and dispensing. -Answered all patient questions -Patient to return for diabetic shoes and inserts  -Patient advised to call the office if any problems or questions arise in the meantime.  Landis Martins, DPM

## 2016-12-28 NOTE — Patient Instructions (Signed)

## 2016-12-30 DIAGNOSIS — L4050 Arthropathic psoriasis, unspecified: Secondary | ICD-10-CM | POA: Diagnosis not present

## 2017-01-12 ENCOUNTER — Telehealth: Payer: Self-pay

## 2017-01-12 NOTE — Telephone Encounter (Signed)
Called to let PT know that he could come and get fitted for his diabetic shoes now, I have his signed paperwork back from his doctor. I let him know that he could come in before 12 with no wait or after 1:00 but he would have to wait in between other PT.

## 2017-01-18 ENCOUNTER — Ambulatory Visit (INDEPENDENT_AMBULATORY_CARE_PROVIDER_SITE_OTHER): Payer: Medicare HMO | Admitting: Sports Medicine

## 2017-01-18 DIAGNOSIS — L84 Corns and callosities: Secondary | ICD-10-CM

## 2017-01-18 DIAGNOSIS — M2141 Flat foot [pes planus] (acquired), right foot: Secondary | ICD-10-CM

## 2017-01-18 DIAGNOSIS — M21619 Bunion of unspecified foot: Secondary | ICD-10-CM

## 2017-01-18 DIAGNOSIS — M2142 Flat foot [pes planus] (acquired), left foot: Secondary | ICD-10-CM

## 2017-01-18 DIAGNOSIS — E1142 Type 2 diabetes mellitus with diabetic polyneuropathy: Secondary | ICD-10-CM

## 2017-01-18 NOTE — Progress Notes (Signed)
Patient met with [facility] and foam impressions were obtained and diabetic shoes chosen. Patient to return to pick up diabetic shoes and insoles when arrive -Dr. Cannon Kettle

## 2017-01-23 DIAGNOSIS — G4733 Obstructive sleep apnea (adult) (pediatric): Secondary | ICD-10-CM | POA: Diagnosis not present

## 2017-01-31 DIAGNOSIS — Z6841 Body Mass Index (BMI) 40.0 and over, adult: Secondary | ICD-10-CM | POA: Diagnosis not present

## 2017-01-31 DIAGNOSIS — M2550 Pain in unspecified joint: Secondary | ICD-10-CM | POA: Diagnosis not present

## 2017-01-31 DIAGNOSIS — L4050 Arthropathic psoriasis, unspecified: Secondary | ICD-10-CM | POA: Diagnosis not present

## 2017-01-31 DIAGNOSIS — Z79899 Other long term (current) drug therapy: Secondary | ICD-10-CM | POA: Diagnosis not present

## 2017-01-31 DIAGNOSIS — M150 Primary generalized (osteo)arthritis: Secondary | ICD-10-CM | POA: Diagnosis not present

## 2017-01-31 DIAGNOSIS — L409 Psoriasis, unspecified: Secondary | ICD-10-CM | POA: Diagnosis not present

## 2017-02-02 ENCOUNTER — Telehealth: Payer: Self-pay | Admitting: Sports Medicine

## 2017-02-02 NOTE — Telephone Encounter (Signed)
Pt calling to check on orthotics

## 2017-02-14 NOTE — Telephone Encounter (Signed)
I will check tomorrow. Lattie Haw

## 2017-02-15 ENCOUNTER — Ambulatory Visit (INDEPENDENT_AMBULATORY_CARE_PROVIDER_SITE_OTHER): Payer: Medicare HMO | Admitting: Sports Medicine

## 2017-02-15 DIAGNOSIS — M2141 Flat foot [pes planus] (acquired), right foot: Secondary | ICD-10-CM | POA: Diagnosis not present

## 2017-02-15 DIAGNOSIS — L84 Corns and callosities: Secondary | ICD-10-CM

## 2017-02-15 DIAGNOSIS — E1142 Type 2 diabetes mellitus with diabetic polyneuropathy: Secondary | ICD-10-CM

## 2017-02-15 DIAGNOSIS — M21619 Bunion of unspecified foot: Secondary | ICD-10-CM

## 2017-02-15 DIAGNOSIS — M2142 Flat foot [pes planus] (acquired), left foot: Secondary | ICD-10-CM | POA: Diagnosis not present

## 2017-02-15 NOTE — Progress Notes (Signed)
Patient met with Justin Cohen. Diabetic shoes were fitted and dispensed to patient with wear and break in period explained. Patient to return to office as scheduled for shoe check and continued diabetic foot care. -Dr. Cannon Kettle

## 2017-02-16 NOTE — Telephone Encounter (Signed)
Patient came in the office Tia Alert) and picked up his diabetic shoes yesterday. Lattie Haw

## 2017-03-02 ENCOUNTER — Ambulatory Visit
Admission: RE | Admit: 2017-03-02 | Discharge: 2017-03-02 | Disposition: A | Discharge status: Home / Self Care | Payer: Medicare HMO | Source: Ambulatory Visit | Attending: Family Medicine | Admitting: Family Medicine

## 2017-03-02 ENCOUNTER — Other Ambulatory Visit: Payer: Self-pay | Admitting: Family Medicine

## 2017-03-02 DIAGNOSIS — I10 Essential (primary) hypertension: Secondary | ICD-10-CM | POA: Diagnosis not present

## 2017-03-02 DIAGNOSIS — Z8546 Personal history of malignant neoplasm of prostate: Secondary | ICD-10-CM | POA: Diagnosis not present

## 2017-03-02 DIAGNOSIS — E7800 Pure hypercholesterolemia, unspecified: Secondary | ICD-10-CM | POA: Diagnosis not present

## 2017-03-02 DIAGNOSIS — R911 Solitary pulmonary nodule: Secondary | ICD-10-CM | POA: Diagnosis not present

## 2017-03-02 DIAGNOSIS — Z1159 Encounter for screening for other viral diseases: Secondary | ICD-10-CM | POA: Diagnosis not present

## 2017-03-02 DIAGNOSIS — Z0000 Encounter for general adult medical examination without abnormal findings: Secondary | ICD-10-CM | POA: Diagnosis not present

## 2017-03-02 DIAGNOSIS — R1032 Left lower quadrant pain: Secondary | ICD-10-CM | POA: Diagnosis not present

## 2017-03-02 DIAGNOSIS — M25512 Pain in left shoulder: Secondary | ICD-10-CM | POA: Diagnosis not present

## 2017-03-02 DIAGNOSIS — M48061 Spinal stenosis, lumbar region without neurogenic claudication: Secondary | ICD-10-CM | POA: Diagnosis not present

## 2017-03-02 DIAGNOSIS — F419 Anxiety disorder, unspecified: Secondary | ICD-10-CM | POA: Diagnosis not present

## 2017-03-02 DIAGNOSIS — R109 Unspecified abdominal pain: Secondary | ICD-10-CM | POA: Diagnosis not present

## 2017-03-02 DIAGNOSIS — E119 Type 2 diabetes mellitus without complications: Secondary | ICD-10-CM | POA: Diagnosis not present

## 2017-03-02 MED ORDER — IOPAMIDOL (ISOVUE-300) INJECTION 61%
125.0000 mL | Freq: Once | INTRAVENOUS | Status: AC | PRN
  Administered 2017-03-02: 125 mL via INTRAVENOUS

## 2017-03-23 DIAGNOSIS — R109 Unspecified abdominal pain: Secondary | ICD-10-CM | POA: Diagnosis not present

## 2017-03-27 DIAGNOSIS — R198 Other specified symptoms and signs involving the digestive system and abdomen: Secondary | ICD-10-CM | POA: Diagnosis not present

## 2017-03-27 DIAGNOSIS — R1084 Generalized abdominal pain: Secondary | ICD-10-CM | POA: Diagnosis not present

## 2017-03-27 DIAGNOSIS — R933 Abnormal findings on diagnostic imaging of other parts of digestive tract: Secondary | ICD-10-CM | POA: Diagnosis not present

## 2017-03-27 DIAGNOSIS — R932 Abnormal findings on diagnostic imaging of liver and biliary tract: Secondary | ICD-10-CM | POA: Diagnosis not present

## 2017-03-31 DIAGNOSIS — Z86010 Personal history of colonic polyps: Secondary | ICD-10-CM | POA: Diagnosis not present

## 2017-03-31 DIAGNOSIS — D126 Benign neoplasm of colon, unspecified: Secondary | ICD-10-CM | POA: Diagnosis not present

## 2017-04-05 ENCOUNTER — Ambulatory Visit: Payer: Medicare HMO | Admitting: Sports Medicine

## 2017-04-06 DIAGNOSIS — D126 Benign neoplasm of colon, unspecified: Secondary | ICD-10-CM | POA: Diagnosis not present

## 2017-05-05 DIAGNOSIS — Z85828 Personal history of other malignant neoplasm of skin: Secondary | ICD-10-CM | POA: Diagnosis not present

## 2017-05-05 DIAGNOSIS — Z08 Encounter for follow-up examination after completed treatment for malignant neoplasm: Secondary | ICD-10-CM | POA: Diagnosis not present

## 2017-05-05 DIAGNOSIS — L570 Actinic keratosis: Secondary | ICD-10-CM | POA: Diagnosis not present

## 2017-05-18 DIAGNOSIS — H401131 Primary open-angle glaucoma, bilateral, mild stage: Secondary | ICD-10-CM | POA: Diagnosis not present

## 2017-05-18 DIAGNOSIS — H2513 Age-related nuclear cataract, bilateral: Secondary | ICD-10-CM | POA: Diagnosis not present

## 2017-05-18 DIAGNOSIS — H5203 Hypermetropia, bilateral: Secondary | ICD-10-CM | POA: Diagnosis not present

## 2017-05-18 DIAGNOSIS — E119 Type 2 diabetes mellitus without complications: Secondary | ICD-10-CM | POA: Diagnosis not present

## 2017-05-18 DIAGNOSIS — H524 Presbyopia: Secondary | ICD-10-CM | POA: Diagnosis not present

## 2017-05-18 DIAGNOSIS — H25013 Cortical age-related cataract, bilateral: Secondary | ICD-10-CM | POA: Diagnosis not present

## 2017-08-09 DIAGNOSIS — S39013D Strain of muscle, fascia and tendon of pelvis, subsequent encounter: Secondary | ICD-10-CM | POA: Diagnosis not present

## 2017-08-09 DIAGNOSIS — M25551 Pain in right hip: Secondary | ICD-10-CM | POA: Diagnosis not present

## 2017-08-10 DIAGNOSIS — M25551 Pain in right hip: Secondary | ICD-10-CM | POA: Diagnosis not present

## 2017-08-10 DIAGNOSIS — S39013D Strain of muscle, fascia and tendon of pelvis, subsequent encounter: Secondary | ICD-10-CM | POA: Diagnosis not present

## 2017-08-14 DIAGNOSIS — S39013D Strain of muscle, fascia and tendon of pelvis, subsequent encounter: Secondary | ICD-10-CM | POA: Diagnosis not present

## 2017-08-14 DIAGNOSIS — M25551 Pain in right hip: Secondary | ICD-10-CM | POA: Diagnosis not present

## 2017-08-14 DIAGNOSIS — E119 Type 2 diabetes mellitus without complications: Secondary | ICD-10-CM | POA: Diagnosis not present

## 2017-08-14 DIAGNOSIS — Z7984 Long term (current) use of oral hypoglycemic drugs: Secondary | ICD-10-CM | POA: Diagnosis not present

## 2017-08-14 DIAGNOSIS — H524 Presbyopia: Secondary | ICD-10-CM | POA: Diagnosis not present

## 2017-08-15 DIAGNOSIS — M48061 Spinal stenosis, lumbar region without neurogenic claudication: Secondary | ICD-10-CM | POA: Diagnosis not present

## 2017-08-15 DIAGNOSIS — R1031 Right lower quadrant pain: Secondary | ICD-10-CM | POA: Diagnosis not present

## 2017-08-17 ENCOUNTER — Other Ambulatory Visit: Payer: Self-pay | Admitting: Family Medicine

## 2017-08-17 DIAGNOSIS — M47896 Other spondylosis, lumbar region: Secondary | ICD-10-CM | POA: Diagnosis not present

## 2017-08-17 DIAGNOSIS — R1031 Right lower quadrant pain: Secondary | ICD-10-CM

## 2017-08-17 DIAGNOSIS — D1800 Hemangioma unspecified site: Secondary | ICD-10-CM | POA: Diagnosis not present

## 2017-08-17 DIAGNOSIS — M25451 Effusion, right hip: Secondary | ICD-10-CM | POA: Diagnosis not present

## 2017-08-24 DIAGNOSIS — M1611 Unilateral primary osteoarthritis, right hip: Secondary | ICD-10-CM | POA: Diagnosis not present

## 2017-08-24 DIAGNOSIS — M25551 Pain in right hip: Secondary | ICD-10-CM | POA: Diagnosis not present

## 2017-08-26 ENCOUNTER — Other Ambulatory Visit: Payer: Medicare HMO

## 2017-08-30 DIAGNOSIS — M25551 Pain in right hip: Secondary | ICD-10-CM | POA: Diagnosis not present

## 2017-08-30 DIAGNOSIS — M1611 Unilateral primary osteoarthritis, right hip: Secondary | ICD-10-CM | POA: Diagnosis not present

## 2017-09-01 DIAGNOSIS — Z23 Encounter for immunization: Secondary | ICD-10-CM | POA: Diagnosis not present

## 2017-09-01 DIAGNOSIS — M48061 Spinal stenosis, lumbar region without neurogenic claudication: Secondary | ICD-10-CM | POA: Diagnosis not present

## 2017-09-01 DIAGNOSIS — Z96652 Presence of left artificial knee joint: Secondary | ICD-10-CM | POA: Diagnosis not present

## 2017-09-01 DIAGNOSIS — R972 Elevated prostate specific antigen [PSA]: Secondary | ICD-10-CM | POA: Diagnosis not present

## 2017-09-01 DIAGNOSIS — Z125 Encounter for screening for malignant neoplasm of prostate: Secondary | ICD-10-CM | POA: Diagnosis not present

## 2017-09-01 DIAGNOSIS — R911 Solitary pulmonary nodule: Secondary | ICD-10-CM | POA: Diagnosis not present

## 2017-09-01 DIAGNOSIS — L4050 Arthropathic psoriasis, unspecified: Secondary | ICD-10-CM | POA: Diagnosis not present

## 2017-09-01 DIAGNOSIS — E7800 Pure hypercholesterolemia, unspecified: Secondary | ICD-10-CM | POA: Diagnosis not present

## 2017-09-01 DIAGNOSIS — E119 Type 2 diabetes mellitus without complications: Secondary | ICD-10-CM | POA: Diagnosis not present

## 2017-09-01 DIAGNOSIS — M25451 Effusion, right hip: Secondary | ICD-10-CM | POA: Diagnosis not present

## 2017-09-01 DIAGNOSIS — I10 Essential (primary) hypertension: Secondary | ICD-10-CM | POA: Diagnosis not present

## 2017-09-06 DIAGNOSIS — M25551 Pain in right hip: Secondary | ICD-10-CM | POA: Diagnosis not present

## 2017-09-06 DIAGNOSIS — M1611 Unilateral primary osteoarthritis, right hip: Secondary | ICD-10-CM | POA: Diagnosis not present

## 2017-09-07 DIAGNOSIS — G4733 Obstructive sleep apnea (adult) (pediatric): Secondary | ICD-10-CM | POA: Diagnosis not present

## 2017-09-07 DIAGNOSIS — J209 Acute bronchitis, unspecified: Secondary | ICD-10-CM | POA: Diagnosis not present

## 2017-09-07 DIAGNOSIS — I10 Essential (primary) hypertension: Secondary | ICD-10-CM | POA: Diagnosis not present

## 2017-09-07 DIAGNOSIS — M1611 Unilateral primary osteoarthritis, right hip: Secondary | ICD-10-CM | POA: Diagnosis not present

## 2017-09-07 DIAGNOSIS — M25551 Pain in right hip: Secondary | ICD-10-CM | POA: Diagnosis not present

## 2017-09-11 ENCOUNTER — Other Ambulatory Visit: Payer: Self-pay | Admitting: Family Medicine

## 2017-09-11 ENCOUNTER — Ambulatory Visit
Admission: RE | Admit: 2017-09-11 | Discharge: 2017-09-11 | Disposition: A | Discharge status: Home / Self Care | Payer: Medicare HMO | Source: Ambulatory Visit | Attending: Family Medicine | Admitting: Family Medicine

## 2017-09-11 DIAGNOSIS — J209 Acute bronchitis, unspecified: Secondary | ICD-10-CM

## 2017-09-11 DIAGNOSIS — J40 Bronchitis, not specified as acute or chronic: Secondary | ICD-10-CM | POA: Diagnosis not present

## 2017-10-25 DIAGNOSIS — Z08 Encounter for follow-up examination after completed treatment for malignant neoplasm: Secondary | ICD-10-CM | POA: Diagnosis not present

## 2017-10-25 DIAGNOSIS — L570 Actinic keratosis: Secondary | ICD-10-CM | POA: Diagnosis not present

## 2017-10-25 DIAGNOSIS — Z85828 Personal history of other malignant neoplasm of skin: Secondary | ICD-10-CM | POA: Diagnosis not present

## 2017-11-05 NOTE — H&P (Signed)
TOTAL HIP ADMISSION H&P  Patient is admitted for right total hip arthroplasty, anterior approach.  Subjective:  Chief Complaint:     Right hip primary OA / pain  HPI: Justin Cohen, 71 y.o. male, has a history of pain and functional disability in the right hip(s) due to arthritis and patient has failed non-surgical conservative treatments for greater than 12 weeks to include NSAID's and/or analgesics, corticosteriod injections, use of assistive devices and activity modification.  Onset of symptoms was gradual starting <1 years ago with rapidlly worsening course since that time.The patient noted no past surgery on the right hip(s).  Patient currently rates pain in the right hip at 9 out of 10 with activity. Patient has night pain, worsening of pain with activity and weight bearing, trendelenberg gait, pain that interfers with activities of daily living and pain with passive range of motion. Patient has evidence of periarticular osteophytes and joint space narrowing by imaging studies. This condition presents safety issues increasing the risk of falls.   There is no current active infection.  Risks, benefits and expectations were discussed with the patient.  Risks including but not limited to the risk of anesthesia, blood clots, nerve damage, blood vessel damage, failure of the prosthesis, infection and up to and including death.  Patient understand the risks, benefits and expectations and wishes to proceed with surgery.   PCP: Antony Contras, MD  D/C Plans:       Home  Post-op Meds:       No Rx given  Tranexamic Acid:      To be given - IV   Decadron:      Is to be given  FYI:     ASA  Norco  DME:   Pt already has equipment   PT:   No PT    Patient Active Problem List   Diagnosis Date Noted  . 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 Malnutrition of moderate degree ([place]) 04/02/2015  . Abscess   . Hyperlipidemia 03/31/2015  . Pericholecystic abscess 03/31/2015  . Postoperative  wound abscess 03/31/2015  . Cholecystitis, acute 03/13/2015  . Sepsis due to Escherichia coli ([place]) 03/11/2015  . Choledocholithiasis 03/08/2015  . Cholangitis 03/06/2015  . HTN (hypertension) 03/06/2015  . DM w/o complication type II ([place]) 03/06/2015  . Encephalopathy due to infection 03/06/2015  . Sleep apnea 03/06/2015  . Morbid obesity ([place]) 02/18/2015  . S/P left TKA 02/17/2015   Past Medical History:  Diagnosis Date  . Arthritis   . Cancer [facility])    prostate cancer , basal cell skin cancer removed from nose 10/2014   . Cancer [facility])    prostate cancer  . Cancer ([place])    melanoma on nose  . Diabetes mellitus without complication ([place])   . Environmental allergies   . Glaucoma   . Hyperlipidemia   . Hypertension   . Sinus problem   . Sleep apnea    cpap- setting at 10   . Sleep apnea     Past Surgical History:  Procedure Laterality Date  . BUNIONECTOMY  2014   right foot  . CHOLECYSTECTOMY N/A 03/12/2015   Procedure: LAPAROSCOPIC CHOLECYSTECTOMY CHOLANGIOGRAM WAS NOT PERFORMED;  Surgeon: Ralene Ok, MD;  Location: [facility] ORS;  Service: General;  Laterality: N/A;  . ERCP N/A 03/08/2015   Procedure: ENDOSCOPIC RETROGRADE CHOLANGIOPANCREATOGRAPHY (ERCP);  Surgeon: Inda Castle, MD;  Location: [facility] ORS;  Service: Gastroenterology;  Laterality: N/A;  . ERCP N/A 03/10/2015   Procedure: ENDOSCOPIC  RETROGRADE CHOLANGIOPANCREATOGRAPHY (ERCP);  Surgeon: Clarene Essex, MD;  Location: [facility];  Service: Endoscopy;  Laterality: N/A;  . ERCP N/A 05/21/2015   Procedure: ENDOSCOPIC RETROGRADE CHOLANGIOPANCREATOGRAPHY (ERCP) with spyglass and stent removal;  Surgeon: Clarene Essex, MD;  Location: [facility] ENDOSCOPY;  Service: Endoscopy;  Laterality: N/A;  . HERNIA REPAIR    . HERNIA REPAIR  2010  . JOINT REPLACEMENT  02/17/15   left knee replacement  . JOINT REPLACEMENT  2001   right knee replacement  . JOINT REPLACEMENT  2012   "right knee cap replacement"  . KNEE SURGERY     X 2  . melanoma  removal  Dec 2015   removed from nose  . PROSTATE SURGERY    . PROSTATE SURGERY  2009   due to prostate cancer  . TOTAL KNEE ARTHROPLASTY Left 02/17/2015   Procedure: LEFT TOTAL KNEE ARTHROPLASTY;  Surgeon: Paralee Cancel, MD;  Location: [facility]S;  Service: Orthopedics;  Laterality: Left;    No current facility-administered medications for this encounter.    Current Outpatient Medications  Medication Sig Dispense Refill Last Dose  . amLODipine-benazepril (LOTREL) 5-10 MG per capsule Take 1 capsule by mouth every morning.    05/20/2015 at Unknown time  . aspirin 81 MG tablet Take 81 mg by mouth daily.   05/20/2015 at Unknown time  . ASPIRIN LOW DOSE PO      . celecoxib (CELEBREX) 200 MG capsule      . cetirizine (ZYRTEC) 10 MG tablet Take 10 mg by mouth daily.   05/20/2015 at Unknown time  . Cetirizine HCl 10 MG CAPS      . Cholecalciferol (VITAMIN D PO) Take by mouth.     . Cholecalciferol (VITAMIN D) 2000 UNITS tablet Take 2,000 Units by mouth daily.   05/20/2015 at Unknown time  . DOCOSAHEXAENOIC ACID PO Take by mouth.     . dorzolamide-timolol (COSOPT) 22.3-6.8 MG/ML ophthalmic solution Place 1 drop into both eyes 2 (two) times daily.    05/20/2015 at Unknown time  . fluticasone (FLONASE) 50 MCG/ACT nasal spray Place 1 spray into both nostrils daily as needed for allergies (allergies).    05/20/2015 at Unknown time  . folic acid (FOLVITE) 1 MG tablet      . glipiZIDE (GLUCOTROL) 5 MG tablet Take 5 mg by mouth every morning.   05/20/2015 at Unknown time  . lansoprazole (PREVACID) 15 MG capsule Take 15 mg by mouth every morning.    05/21/2015 at Unknown time  . LANSOPRAZOLE PO Take by mouth.     . metFORMIN (GLUCOPHAGE) 1000 MG tablet Take 1,000 mg by mouth 2 (two) times daily with a meal.   05/20/2015 at Unknown time  . methotrexate (RHEUMATREX) 2.5 MG tablet      . Multiple Vitamin (MULTIVITAMIN WITH MINERALS) TABS tablet Take 1 tablet by mouth every morning.   05/20/2015 at Unknown time  .  naproxen (NAPROSYN) 500 MG tablet      . NAPROXEN SODIUM PO      . Omega-3 Fatty Acids (FISH OIL) 1000 MG CAPS Take 1,000 mg by mouth every morning.   05/20/2015 at Unknown time  . pioglitazone (ACTOS) 45 MG tablet Take 45 mg by mouth every morning.   05/20/2015 at Unknown time  . PREVACID 15 MG capsule Take 15 mg by mouth every morning.   05/21/2015 at Unknown time  . Probiotic Product (PROBIOTIC ADVANCED PO) Take by mouth.     . Probiotic Product (PROBIOTIC DAILY PO) Take  1 capsule by mouth daily.   05/20/2015 at Unknown time  . rosuvastatin (CRESTOR) 20 MG tablet      . saccharomyces boulardii (FLORASTOR) 250 MG capsule Take 1 capsule (250 mg total) by mouth 2 (two) times daily. 60 capsule 0 05/20/2015 at Unknown time  . venlafaxine XR (EFFEXOR-XR) 75 MG 24 hr capsule Take 75 mg by mouth every morning.   05/21/2015 at Unknown time   No Known Allergies   Social History   Tobacco Use  . Smoking status: Former Smoker    Types: Cigarettes    Last attempt to quit: 03/05/1973    Years since quitting: 44.7  . Smokeless tobacco: Never Used  Substance Use Topics  . Alcohol use: Yes    Comment: occasional glass of wine     Family History  Problem Relation Age of Onset  . Heart failure Mother   . CAD Father   . Lung cancer Sister      Review of Systems  Constitutional: Negative.   HENT: Negative.   Eyes: Negative.   Respiratory: Negative.   Cardiovascular: Negative.   Gastrointestinal: Negative.   Genitourinary: Negative.   Musculoskeletal: Positive for joint pain.  Skin: Negative.   Neurological: Negative.   Endo/Heme/Allergies: Negative.   Psychiatric/Behavioral: The patient is nervous/anxious.     Objective:  Physical Exam  Constitutional: He is oriented to person, place, and time. He appears well-developed.  HENT:  Head: Normocephalic.  Eyes: Pupils are equal, round, and reactive to light.  Neck: Neck supple. No JVD present. No tracheal deviation present. No thyromegaly  present.  Cardiovascular: Normal rate, regular rhythm and intact distal pulses.  Respiratory: Effort normal and breath sounds normal. No respiratory distress. He has no wheezes.  GI: Soft. There is no tenderness. There is no guarding.  Musculoskeletal:       Right hip: He exhibits decreased range of motion, decreased strength, tenderness and bony tenderness. He exhibits no swelling, no deformity and no laceration.  Lymphadenopathy:    He has no cervical adenopathy.  Neurological: He is alert and oriented to person, place, and time. A sensory deficit (neuropathy in bilateral feet) is present.  Skin: Skin is warm and dry.  Psychiatric: He has a normal mood and affect.     Labs:  Estimated body mass index is 40.02 kg/m as calculated from the following:   Height as of 05/21/15: 5\' 9"  (1.753 m).   Weight as of 05/21/15: 122.9 kg (271 lb).   Imaging Review Plain radiographs demonstrate severe degenerative joint disease of the right hip(s). The bone quality appears to be good for age and reported activity level.  Assessment/Plan:  End stage arthritis, right hip(s)  The patient history, physical examination, clinical judgement of the provider and imaging studies are consistent with end stage degenerative joint disease of the right hip(s) and total hip arthroplasty is deemed medically necessary. The treatment options including medical management, injection therapy, arthroscopy and arthroplasty were discussed at length. The risks and benefits of total hip arthroplasty were presented and reviewed. The risks due to aseptic loosening, infection, stiffness, dislocation/subluxation,  thromboembolic complications and other imponderables were discussed.  The patient acknowledged the explanation, agreed to proceed with the plan and consent was signed. Patient is being admitted for inpatient treatment for surgery, pain control, PT, OT, prophylactic antibiotics, VTE prophylaxis, progressive ambulation and  ADL's and discharge planning.The patient is planning to be discharged home.     [place] Cynde Menard   PA-C   11/05/2017,  2:17 PM

## 2017-11-06 DIAGNOSIS — E7800 Pure hypercholesterolemia, unspecified: Secondary | ICD-10-CM | POA: Diagnosis not present

## 2017-11-06 DIAGNOSIS — M25551 Pain in right hip: Secondary | ICD-10-CM | POA: Diagnosis not present

## 2017-11-06 DIAGNOSIS — I10 Essential (primary) hypertension: Secondary | ICD-10-CM | POA: Diagnosis not present

## 2017-11-06 DIAGNOSIS — F419 Anxiety disorder, unspecified: Secondary | ICD-10-CM | POA: Diagnosis not present

## 2017-11-06 DIAGNOSIS — E119 Type 2 diabetes mellitus without complications: Secondary | ICD-10-CM | POA: Diagnosis not present

## 2017-11-14 ENCOUNTER — Other Ambulatory Visit (HOSPITAL_COMMUNITY): Payer: Self-pay | Admitting: Emergency Medicine

## 2017-11-14 NOTE — Patient Instructions (Addendum)
Justin Cohen  11/14/2017   Your procedure is scheduled on: 11-21-17  Report to [facility] Main  Entrance   Follow signs to Short Stay on first floor at 530 AM    Call this number if you have problems the morning of surgery [phone]     Remember: Do not eat food or drink liquids :After Midnight.     Take these medicines the morning of surgery with A SIP OF WATER: venlafaxine, prevacid, nasal spray if needed (may bring )    PLEASE BRING CPAP MASK AND TUBING TO [place]. DEVICE WILL BE PROVIDED FOR YOU!                               You may not have any metal on your body including hair pins and              piercings  Do not wear jewelry, make-up, lotions, powders or perfumes, deodorant                       Men may shave face and neck.   Do not bring valuables to the hospital. Justin Cohen.  Contacts, dentures or bridgework may not be worn into surgery.  Leave suitcase in the car. After surgery it may be brought to your room.                 Please read over the following fact sheets you were given: _____________________________________________________________________             How to Manage Your Diabetes Before and After Surgery  Why is it important to control my blood sugar before and after surgery? . Improving blood sugar levels before and after surgery helps healing and can limit problems. . A way of improving blood sugar control is eating a healthy diet by: o  Eating less sugar and carbohydrates o  Increasing activity/exercise o  Talking with your doctor about reaching your blood sugar goals . High blood sugars (greater than 180 mg/dL) can raise your risk of infections and slow your recovery, so you will need to focus on controlling your diabetes during the weeks before surgery. . Make sure that the doctor who takes care of your diabetes knows about your planned surgery including the  date and location.    WHAT DO I DO ABOUT MY DIABETES MEDICATION?   . THE DAY BEFORE SURGERY, o  take   METFORMIN as normal  o Take GLIPIZIDE as normal o Take ACTOS as normal      . THE MORNING OF SURGERY, Do not take oral diabetes medicines (pills)!   Patient Signature:  Date:   Nurse Signature:  Date:   Reviewed and Endorsed by [facility] Patient Education Committee, August 2015   [facility] - Preparing for Surgery Before surgery, you can play an important role.  Because skin is not sterile, your skin needs to be as free of germs as possible.  You can reduce the number of germs on your skin by washing with CHG (chlorahexidine gluconate) soap before surgery.  CHG is an antiseptic cleaner which kills germs and bonds with the skin to continue killing germs even after washing. Please DO NOT use  if you have an allergy to CHG or antibacterial soaps.  If your skin becomes reddened/irritated stop using the CHG and inform your nurse when you arrive at Short Stay. Do not shave (including legs and underarms) for at least 48 hours prior to the first CHG shower.  You may shave your face/neck. Please follow these instructions carefully:  1.  Shower with CHG Soap the night before surgery and the  morning of Surgery.  2.  If you choose to wash your hair, wash your hair first as usual with your  normal  shampoo.  3.  After you shampoo, rinse your hair and body thoroughly to remove the  shampoo.                           4.  Use CHG as you would any other liquid soap.  You can apply chg directly  to the skin and wash                       Gently with a scrungie or clean washcloth.  5.  Apply the CHG Soap to your body ONLY FROM THE NECK DOWN.   Do not use on face/ open                           Wound or open sores. Avoid contact with eyes, ears mouth and genitals (private parts).                       Wash face,  Genitals (private parts) with your normal soap.             6.  Wash thoroughly,  paying special attention to the area where your surgery  will be performed.  7.  Thoroughly rinse your body with warm water from the neck down.  8.  DO NOT shower/wash with your normal soap after using and rinsing off  the CHG Soap.                9.  Pat yourself dry with a clean towel.            10.  Wear clean pajamas.            11.  Place clean sheets on your bed the night of your first shower and do not  sleep with pets. Day of Surgery : Do not apply any lotions/deodorants the morning of surgery.  Please wear clean clothes to the hospital/surgery center.  FAILURE TO FOLLOW THESE INSTRUCTIONS MAY RESULT IN THE CANCELLATION OF YOUR SURGERY PATIENT SIGNATURE_________________________________  NURSE SIGNATURE__________________________________  ________________________________________________________________________   Justin Cohen  An incentive spirometer is a tool that can help keep your lungs clear and active. This tool measures how well you are filling your lungs with each breath. Taking long deep breaths may help reverse or decrease the chance of developing breathing (pulmonary) problems (especially infection) following:  A long period of time when you are unable to move or be active. BEFORE THE PROCEDURE   If the spirometer includes an indicator to show your best effort, your nurse or respiratory therapist will set it to a desired goal.  If possible, sit up straight or lean slightly forward. Try not to slouch.  Hold the incentive spirometer in an upright position. INSTRUCTIONS FOR USE  1. Sit on the edge of your bed if possible, or sit  up as far as you can in bed or on a chair. 2. Hold the incentive spirometer in an upright position. 3. Breathe out normally. 4. Place the mouthpiece in your mouth and seal your lips tightly around it. 5. Breathe in slowly and as deeply as possible, raising the piston or the ball toward the top of the column. 6. Hold your breath for 3-5  seconds or for as long as possible. Allow the piston or ball to fall to the bottom of the column. 7. Remove the mouthpiece from your mouth and breathe out normally. 8. Rest for a few seconds and repeat Steps 1 through 7 at least 10 times every 1-2 hours when you are awake. Take your time and take a few normal breaths between deep breaths. 9. The spirometer may include an indicator to show your best effort. Use the indicator as a goal to work toward during each repetition. 10. After each set of 10 deep breaths, practice coughing to be sure your lungs are clear. If you have an incision (the cut made at the time of surgery), support your incision when coughing by placing a pillow or rolled up towels firmly against it. Once you are able to get out of bed, walk around indoors and cough well. You may stop using the incentive spirometer when instructed by your caregiver.  RISKS AND COMPLICATIONS  Take your time so you do not get dizzy or light-headed.  If you are in pain, you may need to take or ask for pain medication before doing incentive spirometry. It is harder to take a deep breath if you are having pain. AFTER USE  Rest and breathe slowly and easily.  It can be helpful to keep track of a log of your progress. Your caregiver can provide you with a simple table to help with this. If you are using the spirometer at home, follow these instructions: [place] IF:   You are having difficultly using the spirometer.  You have trouble using the spirometer as often as instructed.  Your pain medication is not giving enough relief while using the spirometer.  You develop fever of 100.5 F (38.1 C) or higher. SEEK IMMEDIATE MEDICAL CARE IF:   You cough up bloody sputum that had not been present before.  You develop fever of 102 F (38.9 C) or greater.  You develop worsening pain at or near the incision site. MAKE SURE YOU:   Understand these instructions.  Will watch your  condition.  Will get help right away if you are not doing well or get worse. Document Released: 03/06/2007 Document Revised: 01/16/2012 Document Reviewed: 05/07/2007 ExitCare Patient Information 2014 ExitCare, [place].   ________________________________________________________________________  WHAT IS A BLOOD TRANSFUSION? Blood Transfusion Information  A transfusion is the replacement of blood or some of its parts. Blood is made up of multiple cells which provide different functions.  Red blood cells carry oxygen and are used for blood loss replacement.  White blood cells fight against infection.  Platelets control bleeding.  Plasma helps clot blood.  Other blood products are available for specialized needs, such as hemophilia or other clotting disorders. BEFORE THE TRANSFUSION  Who gives blood for transfusions?   Healthy volunteers who are fully evaluated to make sure their blood is safe. This is blood bank blood. Transfusion therapy is the safest it has ever been in the practice of medicine. Before blood is taken from a donor, a complete history is taken to make sure that person has  no history of diseases nor engages in risky social behavior (examples are intravenous drug use or sexual activity with multiple partners). The donor's travel history is screened to minimize risk of transmitting infections, such as malaria. The donated blood is tested for signs of infectious diseases, such as HIV and hepatitis. The blood is then tested to be sure it is compatible with you in order to minimize the chance of a transfusion reaction. If you or a relative donates blood, this is often done in anticipation of surgery and is not appropriate for emergency situations. It takes many days to process the donated blood. RISKS AND COMPLICATIONS Although transfusion therapy is very safe and saves many lives, the main dangers of transfusion include:   Getting an infectious disease.  Developing a transfusion  reaction. This is an allergic reaction to something in the blood you were given. Every precaution is taken to prevent this. The decision to have a blood transfusion has been considered carefully by your caregiver before blood is given. Blood is not given unless the benefits outweigh the risks. AFTER THE TRANSFUSION  Right after receiving a blood transfusion, you will usually feel much better and more energetic. This is especially true if your red blood cells have gotten low (anemic). The transfusion raises the level of the red blood cells which carry oxygen, and this usually causes an energy increase.  The nurse administering the transfusion will monitor you carefully for complications. HOME CARE INSTRUCTIONS  No special instructions are needed after a transfusion. You may find your energy is better. Speak with your caregiver about any limitations on activity for underlying diseases you may have. SEEK MEDICAL CARE IF:   Your condition is not improving after your transfusion.  You develop redness or irritation at the intravenous (IV) site. SEEK IMMEDIATE MEDICAL CARE IF:  Any of the following symptoms occur over the next 12 hours:  Shaking chills.  You have a temperature by mouth above 102 F (38.9 C), not controlled by medicine.  Chest, back, or muscle pain.  People around you feel you are not acting correctly or are confused.  Shortness of breath or difficulty breathing.  Dizziness and fainting.  You get a rash or develop hives.  You have a decrease in urine output.  Your urine turns a dark color or changes to pink, red, or brown. Any of the following symptoms occur over the next 10 days:  You have a temperature by mouth above 102 F (38.9 C), not controlled by medicine.  Shortness of breath.  Weakness after normal activity.  The white part of the eye turns yellow (jaundice).  You have a decrease in the amount of urine or are urinating less often.  Your urine turns a  dark color or changes to pink, red, or brown. Document Released: 10/21/2000 Document Revised: 01/16/2012 Document Reviewed: 06/09/2008 [facility] Patient Information 2014 [place], [place].  _______________________________________________________________________

## 2017-11-14 NOTE — Progress Notes (Signed)
cxr 09-11-17 epic

## 2017-11-16 ENCOUNTER — Encounter (HOSPITAL_COMMUNITY): Payer: Self-pay

## 2017-11-16 ENCOUNTER — Other Ambulatory Visit: Payer: Self-pay

## 2017-11-16 ENCOUNTER — Encounter (INDEPENDENT_AMBULATORY_CARE_PROVIDER_SITE_OTHER): Payer: Self-pay

## 2017-11-16 ENCOUNTER — Encounter (HOSPITAL_COMMUNITY)
Admission: RE | Admit: 2017-11-16 | Discharge: 2017-11-16 | Disposition: A | Discharge status: Home / Self Care | Payer: Medicare HMO | Source: Ambulatory Visit | Attending: Orthopedic Surgery | Admitting: Orthopedic Surgery

## 2017-11-16 DIAGNOSIS — Z0183 Encounter for blood typing: Secondary | ICD-10-CM | POA: Insufficient documentation

## 2017-11-16 DIAGNOSIS — Z01818 Encounter for other preprocedural examination: Secondary | ICD-10-CM | POA: Insufficient documentation

## 2017-11-16 DIAGNOSIS — I10 Essential (primary) hypertension: Secondary | ICD-10-CM | POA: Diagnosis not present

## 2017-11-16 DIAGNOSIS — Z01812 Encounter for preprocedural laboratory examination: Secondary | ICD-10-CM | POA: Diagnosis not present

## 2017-11-16 DIAGNOSIS — E119 Type 2 diabetes mellitus without complications: Secondary | ICD-10-CM | POA: Insufficient documentation

## 2017-11-16 DIAGNOSIS — M1611 Unilateral primary osteoarthritis, right hip: Secondary | ICD-10-CM | POA: Insufficient documentation

## 2017-11-16 HISTORY — DX: Adverse effect of unspecified anesthetic, initial encounter: T41.45XA

## 2017-11-16 HISTORY — DX: Postnasal drip: R09.82

## 2017-11-16 HISTORY — DX: Other complications of anesthesia, initial encounter: T88.59XA

## 2017-11-16 LAB — BASIC METABOLIC PANEL
ANION GAP: 7 (ref 5–15)
BUN: 21 mg/dL — ABNORMAL HIGH (ref 6–20)
CHLORIDE: 101 mmol/L (ref 101–111)
CO2: 27 mmol/L (ref 22–32)
CREATININE: 0.93 mg/dL (ref 0.61–1.24)
Calcium: 9.6 mg/dL (ref 8.9–10.3)
GFR calc non Af Amer: >60 mL/min (ref >60)
Glucose, Bld: 164 mg/dL — ABNORMAL HIGH (ref 65–99)
POTASSIUM: 4.6 mmol/L (ref 3.5–5.1)
Sodium: 135 mmol/L (ref 135–145)

## 2017-11-16 LAB — CBC
HEMATOCRIT: 41.6 % (ref 39.0–52.0)
HEMOGLOBIN: 13.9 g/dL (ref 13.0–17.0)
MCH: 29.3 pg (ref 26.0–34.0)
MCHC: 33.4 g/dL (ref 30.0–36.0)
MCV: 87.8 fL (ref 78.0–100.0)
Platelets: 389 10*3/uL (ref 150–400)
RBC: 4.74 MIL/uL (ref 4.22–5.81)
RDW: 16.7 % — ABNORMAL HIGH (ref 11.5–15.5)
WBC: 7.6 10*3/uL (ref 4.0–10.5)

## 2017-11-16 LAB — SURGICAL PCR SCREEN
MRSA, PCR: NEGATIVE
Staphylococcus aureus: NEGATIVE

## 2017-11-16 LAB — GLUCOSE, CAPILLARY: GLUCOSE-CAPILLARY: 188 mg/dL — AB (ref 65–99)

## 2017-11-16 LAB — HEMOGLOBIN A1C
Hgb A1c MFr Bld: 6.3 % — ABNORMAL HIGH (ref 4.8–5.6)
Mean Plasma Glucose: 134.11 mg/dL

## 2017-11-16 NOTE — Progress Notes (Signed)
LOV DR DAVID Moreen Fowler 11-06-17 ON CHART [facility]   CLEARANCE DR DAVID Moreen Fowler 11-06-17 ON CHART   HGBA1C, CMP, PSA, LIPID PANEL, MICROALBUMIN 09-01-17 ON CHART [facility]

## 2017-11-17 NOTE — Progress Notes (Signed)
At pre-op appt Justin Cohen mentioned "a lot of sinus drainage and frequent swallowing" for which he was using a "Neti pot " and seeing improvements (see "PND" check box in history). Today wife of Justin Cohen (who was present at pre-op appt) called to report that Justin Cohen "coughed all through the night" and "didn't get much sleep". She also states " I called Justin Honor Loh office to let them know and called the primary care doctor as well just 30 minutes ago. I am waiting to hear back from them". Justin Cohen also mentions she spoke to a Justin Cohen that works in the [employer] and states "Justin Cohen  asked a Justin Cohen who is an anesthesiologist about all this  and the doctor  says it wouldn't stop him from having surgery".  RN suggested to Justin Cohen that she follow any advice received from surgeon's office primarily and that  Justin Cohen may try OTC cough medicine if Justin Alvan Dame assures it is okay to use. RN also advised that if Justin Cohen developed a fever or started coughing up any discolored sputum, she should report these symptoms to the surgeon. Justin Cohen verbalized understanding.

## 2017-11-20 ENCOUNTER — Encounter (HOSPITAL_COMMUNITY): Payer: Self-pay | Admitting: Anesthesiology

## 2017-11-20 MED ORDER — TRANEXAMIC ACID 1000 MG/10ML IV SOLN
1000.0000 mg | INTRAVENOUS | Status: AC
  Administered 2017-11-21: 1000 mg via INTRAVENOUS
  Filled 2017-11-20: qty 1100

## 2017-11-20 MED ORDER — DEXTROSE 5 % IV SOLN
3.0000 g | INTRAVENOUS | Status: AC
  Administered 2017-11-21: 3 g via INTRAVENOUS
  Filled 2017-11-20: qty 3

## 2017-11-21 ENCOUNTER — Other Ambulatory Visit: Payer: Self-pay

## 2017-11-21 ENCOUNTER — Encounter (HOSPITAL_COMMUNITY): Payer: Self-pay | Admitting: *Deleted

## 2017-11-21 ENCOUNTER — Inpatient Hospital Stay (HOSPITAL_COMMUNITY): Payer: Medicare HMO

## 2017-11-21 ENCOUNTER — Inpatient Hospital Stay (HOSPITAL_COMMUNITY): Payer: Medicare HMO | Admitting: Anesthesiology

## 2017-11-21 ENCOUNTER — Encounter (HOSPITAL_COMMUNITY)
Admission: RE | Disposition: A | Discharge status: Home / Self Care | Payer: Self-pay | Source: Ambulatory Visit | Attending: Orthopedic Surgery

## 2017-11-21 ENCOUNTER — Inpatient Hospital Stay (HOSPITAL_COMMUNITY)
Admission: RE | Admit: 2017-11-21 | Discharge: 2017-11-22 | DRG: 470 | Disposition: A | Discharge status: Home / Self Care | Payer: Medicare HMO | Source: Ambulatory Visit | Attending: Orthopedic Surgery | Admitting: Orthopedic Surgery

## 2017-11-21 DIAGNOSIS — Z791 Long term (current) use of non-steroidal anti-inflammatories (NSAID): Secondary | ICD-10-CM | POA: Diagnosis not present

## 2017-11-21 DIAGNOSIS — Z7984 Long term (current) use of oral hypoglycemic drugs: Secondary | ICD-10-CM

## 2017-11-21 DIAGNOSIS — Z9181 History of falling: Secondary | ICD-10-CM

## 2017-11-21 DIAGNOSIS — Z7982 Long term (current) use of aspirin: Secondary | ICD-10-CM | POA: Diagnosis not present

## 2017-11-21 DIAGNOSIS — E119 Type 2 diabetes mellitus without complications: Secondary | ICD-10-CM | POA: Diagnosis not present

## 2017-11-21 DIAGNOSIS — Z87891 Personal history of nicotine dependence: Secondary | ICD-10-CM

## 2017-11-21 DIAGNOSIS — I10 Essential (primary) hypertension: Secondary | ICD-10-CM | POA: Diagnosis not present

## 2017-11-21 DIAGNOSIS — Z79899 Other long term (current) drug therapy: Secondary | ICD-10-CM | POA: Diagnosis not present

## 2017-11-21 DIAGNOSIS — Z85820 Personal history of malignant melanoma of skin: Secondary | ICD-10-CM | POA: Diagnosis not present

## 2017-11-21 DIAGNOSIS — Z85828 Personal history of other malignant neoplasm of skin: Secondary | ICD-10-CM

## 2017-11-21 DIAGNOSIS — H409 Unspecified glaucoma: Secondary | ICD-10-CM | POA: Diagnosis present

## 2017-11-21 DIAGNOSIS — Z96653 Presence of artificial knee joint, bilateral: Secondary | ICD-10-CM | POA: Diagnosis present

## 2017-11-21 DIAGNOSIS — E785 Hyperlipidemia, unspecified: Secondary | ICD-10-CM | POA: Diagnosis not present

## 2017-11-21 DIAGNOSIS — K810 Acute cholecystitis: Secondary | ICD-10-CM | POA: Diagnosis not present

## 2017-11-21 DIAGNOSIS — M1611 Unilateral primary osteoarthritis, right hip: Secondary | ICD-10-CM | POA: Diagnosis not present

## 2017-11-21 DIAGNOSIS — G4730 Sleep apnea, unspecified: Secondary | ICD-10-CM | POA: Diagnosis not present

## 2017-11-21 DIAGNOSIS — Z8546 Personal history of malignant neoplasm of prostate: Secondary | ICD-10-CM | POA: Diagnosis not present

## 2017-11-21 DIAGNOSIS — Z6841 Body Mass Index (BMI) 40.0 and over, adult: Secondary | ICD-10-CM | POA: Diagnosis not present

## 2017-11-21 DIAGNOSIS — Z96641 Presence of right artificial hip joint: Secondary | ICD-10-CM | POA: Diagnosis not present

## 2017-11-21 DIAGNOSIS — Z471 Aftercare following joint replacement surgery: Secondary | ICD-10-CM | POA: Diagnosis not present

## 2017-11-21 DIAGNOSIS — Z96649 Presence of unspecified artificial hip joint: Secondary | ICD-10-CM

## 2017-11-21 HISTORY — PX: TOTAL HIP ARTHROPLASTY: SHX124

## 2017-11-21 LAB — GLUCOSE, CAPILLARY
GLUCOSE-CAPILLARY: 143 mg/dL — AB (ref 65–99)
GLUCOSE-CAPILLARY: 272 mg/dL — AB (ref 65–99)
GLUCOSE-CAPILLARY: 252 mg/dL — AB (ref 65–99)
Glucose-Capillary: 130 mg/dL — ABNORMAL HIGH (ref 65–99)

## 2017-11-21 LAB — TYPE AND SCREEN
ABO/RH(D): O POS
Antibody Screen: NEGATIVE

## 2017-11-21 SURGERY — ARTHROPLASTY, HIP, TOTAL, ANTERIOR APPROACH
Anesthesia: Spinal | Site: Hip | Laterality: Right

## 2017-11-21 MED ORDER — METFORMIN HCL 500 MG PO TABS
1000.0000 mg | ORAL_TABLET | Freq: Two times a day (BID) | ORAL | Status: DC
  Administered 2017-11-21 – 2017-11-22 (×2): 1000 mg via ORAL
  Filled 2017-11-21 (×2): qty 2

## 2017-11-21 MED ORDER — STERILE WATER FOR IRRIGATION IR SOLN
Status: DC | PRN
  Administered 2017-11-21: 2000 mL

## 2017-11-21 MED ORDER — ONDANSETRON HCL 4 MG PO TABS: 4.0000 mg | ORAL_TABLET | Freq: Four times a day (QID) | ORAL | Status: DC | PRN

## 2017-11-21 MED ORDER — METHOCARBAMOL 500 MG PO TABS: 500.0000 mg | ORAL_TABLET | Freq: Four times a day (QID) | ORAL | Status: DC | PRN

## 2017-11-21 MED ORDER — DOCUSATE SODIUM 100 MG PO CAPS
100.0000 mg | ORAL_CAPSULE | Freq: Two times a day (BID) | ORAL | Status: DC
  Administered 2017-11-21 – 2017-11-22 (×2): 100 mg via ORAL
  Filled 2017-11-21 (×2): qty 1

## 2017-11-21 MED ORDER — TRANEXAMIC ACID 1000 MG/10ML IV SOLN
1000.0000 mg | Freq: Once | INTRAVENOUS | Status: AC
  Administered 2017-11-21: 14:00:00 1000 mg via INTRAVENOUS
  Filled 2017-11-21: qty 1100

## 2017-11-21 MED ORDER — FERROUS SULFATE 325 (65 FE) MG PO TABS: 325.0000 mg | ORAL_TABLET | Freq: Three times a day (TID) | ORAL | 3 refills | Status: DC

## 2017-11-21 MED ORDER — ACETAMINOPHEN 650 MG RE SUPP: 650.0000 mg | RECTAL | Status: DC | PRN

## 2017-11-21 MED ORDER — MAGNESIUM CITRATE PO SOLN: 1.0000 | Freq: Once | ORAL | Status: DC | PRN

## 2017-11-21 MED ORDER — CELECOXIB 200 MG PO CAPS
200.0000 mg | ORAL_CAPSULE | Freq: Two times a day (BID) | ORAL | Status: DC
  Administered 2017-11-21 – 2017-11-22 (×3): 200 mg via ORAL
  Filled 2017-11-21 (×3): qty 1

## 2017-11-21 MED ORDER — HYDROCODONE-ACETAMINOPHEN 7.5-325 MG PO TABS
1.0000 | ORAL_TABLET | ORAL | Status: DC | PRN
  Administered 2017-11-21 (×2): 1 via ORAL
  Filled 2017-11-21: qty 1

## 2017-11-21 MED ORDER — ALUM & MAG HYDROXIDE-SIMETH 200-200-20 MG/5ML PO SUSP: 15.0000 mL | ORAL | Status: DC | PRN

## 2017-11-21 MED ORDER — DIPHENHYDRAMINE HCL 12.5 MG/5ML PO ELIX: 12.5000 mg | ORAL_SOLUTION | ORAL | Status: DC | PRN

## 2017-11-21 MED ORDER — METHOCARBAMOL 1000 MG/10ML IJ SOLN
500.0000 mg | Freq: Four times a day (QID) | INTRAVENOUS | Status: DC | PRN
  Administered 2017-11-21: 500 mg via INTRAVENOUS
  Filled 2017-11-21: qty 550

## 2017-11-21 MED ORDER — ASPIRIN 81 MG PO CHEW
81.0000 mg | CHEWABLE_TABLET | Freq: Two times a day (BID) | ORAL | Status: DC
  Administered 2017-11-21 – 2017-11-22 (×2): 81 mg via ORAL
  Filled 2017-11-21 (×2): qty 1

## 2017-11-21 MED ORDER — FENTANYL CITRATE (PF) 100 MCG/2ML IJ SOLN
INTRAMUSCULAR | Status: AC
  Filled 2017-11-21: qty 2

## 2017-11-21 MED ORDER — ONDANSETRON HCL 4 MG/2ML IJ SOLN: 4.0000 mg | Freq: Four times a day (QID) | INTRAMUSCULAR | Status: DC | PRN

## 2017-11-21 MED ORDER — PROPOFOL 10 MG/ML IV BOLUS
INTRAVENOUS | Status: AC
  Filled 2017-11-21: qty 80

## 2017-11-21 MED ORDER — AMLODIPINE BESYLATE 5 MG PO TABS
5.0000 mg | ORAL_TABLET | Freq: Once | ORAL | Status: AC
  Administered 2017-11-21: 5 mg via ORAL
  Filled 2017-11-21: qty 1

## 2017-11-21 MED ORDER — PROMETHAZINE HCL 25 MG/ML IJ SOLN: 6.2500 mg | INTRAMUSCULAR | Status: DC | PRN

## 2017-11-21 MED ORDER — HYDROCODONE-ACETAMINOPHEN 7.5-325 MG PO TABS
2.0000 | ORAL_TABLET | ORAL | Status: DC | PRN
  Filled 2017-11-21: qty 2

## 2017-11-21 MED ORDER — MIDAZOLAM HCL 5 MG/5ML IJ SOLN
INTRAMUSCULAR | Status: DC | PRN
  Administered 2017-11-21: 2 mg via INTRAVENOUS

## 2017-11-21 MED ORDER — DEXAMETHASONE SODIUM PHOSPHATE 10 MG/ML IJ SOLN
10.0000 mg | Freq: Once | INTRAMUSCULAR | Status: AC
  Administered 2017-11-22: 10 mg via INTRAVENOUS
  Filled 2017-11-21: qty 1

## 2017-11-21 MED ORDER — METOCLOPRAMIDE HCL 5 MG/ML IJ SOLN: 5.0000 mg | Freq: Three times a day (TID) | INTRAMUSCULAR | Status: DC | PRN

## 2017-11-21 MED ORDER — FENTANYL CITRATE (PF) 100 MCG/2ML IJ SOLN
INTRAMUSCULAR | Status: DC | PRN
  Administered 2017-11-21 (×2): 50 ug via INTRAVENOUS

## 2017-11-21 MED ORDER — PHENOL 1.4 % MT LIQD: 1.0000 | OROMUCOSAL | Status: DC | PRN

## 2017-11-21 MED ORDER — METHOCARBAMOL 500 MG PO TABS: 500.0000 mg | ORAL_TABLET | Freq: Four times a day (QID) | ORAL | 0 refills | Status: DC | PRN

## 2017-11-21 MED ORDER — GLIPIZIDE 5 MG PO TABS
5.0000 mg | ORAL_TABLET | Freq: Every day | ORAL | Status: DC
  Administered 2017-11-22: 5 mg via ORAL
  Filled 2017-11-21: qty 1

## 2017-11-21 MED ORDER — ROSUVASTATIN CALCIUM 5 MG PO TABS
5.0000 mg | ORAL_TABLET | Freq: Every evening | ORAL | Status: DC
  Administered 2017-11-21: 18:00:00 5 mg via ORAL
  Filled 2017-11-21: qty 1

## 2017-11-21 MED ORDER — BISACODYL 10 MG RE SUPP
10.0000 mg | Freq: Every day | RECTAL | Status: DC | PRN
Start: 2017-11-21 — End: 2017-11-22

## 2017-11-21 MED ORDER — DEXAMETHASONE SODIUM PHOSPHATE 10 MG/ML IJ SOLN
10.0000 mg | Freq: Once | INTRAMUSCULAR | Status: AC
  Administered 2017-11-21: 10 mg via INTRAVENOUS

## 2017-11-21 MED ORDER — HYDROMORPHONE HCL 1 MG/ML IJ SOLN
0.2500 mg | INTRAMUSCULAR | Status: DC | PRN
  Administered 2017-11-21 (×2): 0.5 mg via INTRAVENOUS

## 2017-11-21 MED ORDER — ONDANSETRON HCL 4 MG/2ML IJ SOLN
INTRAMUSCULAR | Status: DC | PRN
  Administered 2017-11-21: 4 mg via INTRAVENOUS

## 2017-11-21 MED ORDER — MENTHOL 3 MG MT LOZG: 1.0000 | LOZENGE | OROMUCOSAL | Status: DC | PRN

## 2017-11-21 MED ORDER — HYDROMORPHONE HCL 1 MG/ML IJ SOLN
INTRAMUSCULAR | Status: AC
  Filled 2017-11-21: qty 1

## 2017-11-21 MED ORDER — LATANOPROST 0.005 % OP SOLN
1.0000 [drp] | Freq: Every day | OPHTHALMIC | Status: DC
  Administered 2017-11-21: 1 [drp] via OPHTHALMIC
  Filled 2017-11-21: qty 2.5

## 2017-11-21 MED ORDER — MIDAZOLAM HCL 2 MG/2ML IJ SOLN
INTRAMUSCULAR | Status: AC
  Filled 2017-11-21: qty 2

## 2017-11-21 MED ORDER — ASPIRIN 81 MG PO CHEW: 81.0000 mg | CHEWABLE_TABLET | Freq: Two times a day (BID) | ORAL | 0 refills | Status: AC

## 2017-11-21 MED ORDER — PHENYLEPHRINE 40 MCG/ML (10ML) SYRINGE FOR IV PUSH (FOR BLOOD PRESSURE SUPPORT)
PREFILLED_SYRINGE | INTRAVENOUS | Status: AC
  Filled 2017-11-21: qty 10

## 2017-11-21 MED ORDER — PIOGLITAZONE HCL 45 MG PO TABS
45.0000 mg | ORAL_TABLET | Freq: Every day | ORAL | Status: DC
  Administered 2017-11-22: 10:00:00 45 mg via ORAL
  Filled 2017-11-21: qty 1

## 2017-11-21 MED ORDER — HYDROMORPHONE HCL 1 MG/ML IJ SOLN: 0.5000 mg | INTRAMUSCULAR | Status: DC | PRN

## 2017-11-21 MED ORDER — POLYETHYLENE GLYCOL 3350 17 G PO PACK: 17.0000 g | PACK | Freq: Two times a day (BID) | ORAL | 0 refills | Status: DC

## 2017-11-21 MED ORDER — SODIUM CHLORIDE 0.9 % IR SOLN
Status: DC | PRN
  Administered 2017-11-21: 1000 mL

## 2017-11-21 MED ORDER — PROPOFOL 500 MG/50ML IV EMUL
INTRAVENOUS | Status: DC | PRN
  Administered 2017-11-21: 25 ug/kg/min via INTRAVENOUS

## 2017-11-21 MED ORDER — CHLORHEXIDINE GLUCONATE 4 % EX LIQD: 60.0000 mL | Freq: Once | CUTANEOUS | Status: DC

## 2017-11-21 MED ORDER — POLYETHYLENE GLYCOL 3350 17 G PO PACK
17.0000 g | PACK | Freq: Two times a day (BID) | ORAL | Status: DC
  Administered 2017-11-21 – 2017-11-22 (×2): 17 g via ORAL
  Filled 2017-11-21 (×2): qty 1

## 2017-11-21 MED ORDER — FLUTICASONE PROPIONATE 50 MCG/ACT NA SUSP
2.0000 | Freq: Every day | NASAL | Status: DC
  Administered 2017-11-22: 2 via NASAL
  Filled 2017-11-21: qty 16

## 2017-11-21 MED ORDER — PANTOPRAZOLE SODIUM 20 MG PO TBEC
20.0000 mg | DELAYED_RELEASE_TABLET | Freq: Every day | ORAL | Status: DC
  Administered 2017-11-22: 20 mg via ORAL
  Filled 2017-11-21: qty 1

## 2017-11-21 MED ORDER — VENLAFAXINE HCL ER 75 MG PO CP24
75.0000 mg | ORAL_CAPSULE | Freq: Every day | ORAL | Status: DC
  Administered 2017-11-22: 08:00:00 75 mg via ORAL
  Filled 2017-11-21: qty 1

## 2017-11-21 MED ORDER — FERROUS SULFATE 325 (65 FE) MG PO TABS
325.0000 mg | ORAL_TABLET | Freq: Three times a day (TID) | ORAL | Status: DC
  Administered 2017-11-22: 08:00:00 325 mg via ORAL
  Filled 2017-11-21: qty 1

## 2017-11-21 MED ORDER — METOCLOPRAMIDE HCL 5 MG PO TABS: 5.0000 mg | ORAL_TABLET | Freq: Three times a day (TID) | ORAL | Status: DC | PRN

## 2017-11-21 MED ORDER — DOCUSATE SODIUM 100 MG PO CAPS: 100.0000 mg | ORAL_CAPSULE | Freq: Two times a day (BID) | ORAL | 0 refills | Status: DC

## 2017-11-21 MED ORDER — ACETAMINOPHEN 325 MG PO TABS
650.0000 mg | ORAL_TABLET | ORAL | Status: DC | PRN
Start: 2017-11-21 — End: 2017-11-22

## 2017-11-21 MED ORDER — PHENYLEPHRINE 40 MCG/ML (10ML) SYRINGE FOR IV PUSH (FOR BLOOD PRESSURE SUPPORT)
PREFILLED_SYRINGE | INTRAVENOUS | Status: DC | PRN
  Administered 2017-11-21 (×6): 40 ug via INTRAVENOUS

## 2017-11-21 MED ORDER — HYDROCODONE-ACETAMINOPHEN 7.5-325 MG PO TABS: 1.0000 | ORAL_TABLET | ORAL | 0 refills | Status: DC | PRN

## 2017-11-21 MED ORDER — LACTATED RINGERS IV SOLN
INTRAVENOUS | Status: DC | PRN
  Administered 2017-11-21 (×2): via INTRAVENOUS

## 2017-11-21 MED ORDER — INSULIN ASPART 100 UNIT/ML ~~LOC~~ SOLN
0.0000 [IU] | Freq: Three times a day (TID) | SUBCUTANEOUS | Status: DC
  Administered 2017-11-21: 8 [IU] via SUBCUTANEOUS
  Administered 2017-11-22: 08:00:00 3 [IU] via SUBCUTANEOUS

## 2017-11-21 MED ORDER — CEFAZOLIN SODIUM-DEXTROSE 2-4 GM/100ML-% IV SOLN
2.0000 g | Freq: Four times a day (QID) | INTRAVENOUS | Status: AC
  Administered 2017-11-21 (×2): 2 g via INTRAVENOUS
  Filled 2017-11-21 (×2): qty 100

## 2017-11-21 MED ORDER — LORATADINE 10 MG PO TABS
10.0000 mg | ORAL_TABLET | Freq: Every day | ORAL | Status: DC
  Administered 2017-11-22: 10:00:00 10 mg via ORAL
  Filled 2017-11-21: qty 1

## 2017-11-21 MED ORDER — SODIUM CHLORIDE 0.9 % IV SOLN
INTRAVENOUS | Status: DC
  Administered 2017-11-21: 14:00:00 via INTRAVENOUS

## 2017-11-21 SURGICAL SUPPLY — 37 items
BAG DECANTER FOR FLEXI CONT (MISCELLANEOUS) IMPLANT
BAG ZIPLOCK 12X15 (MISCELLANEOUS) IMPLANT
BLADE SAG 18X100X1.27 (BLADE) ×3 IMPLANT
CAPT HIP TOTAL 2 ×3 IMPLANT
COVER PERINEAL POST (MISCELLANEOUS) ×3 IMPLANT
COVER SURGICAL LIGHT HANDLE (MISCELLANEOUS) ×3 IMPLANT
DERMABOND ADVANCED (GAUZE/BANDAGES/DRESSINGS) ×2
DERMABOND ADVANCED .7 DNX12 (GAUZE/BANDAGES/DRESSINGS) ×1 IMPLANT
DRAPE STERI IOBAN 125X83 (DRAPES) ×3 IMPLANT
DRAPE U-SHAPE 47X51 STRL (DRAPES) ×6 IMPLANT
DRESSING AQUACEL AG SP 3.5X10 (GAUZE/BANDAGES/DRESSINGS) ×1 IMPLANT
DRSG AQUACEL AG SP 3.5X10 (GAUZE/BANDAGES/DRESSINGS) ×3
DURAPREP 26ML APPLICATOR (WOUND CARE) ×3 IMPLANT
ELECT REM PT RETURN 15FT ADLT (MISCELLANEOUS) ×3 IMPLANT
GLOVE BIOGEL M STRL SZ7.5 (GLOVE) IMPLANT
GLOVE BIOGEL PI IND STRL 7.5 (GLOVE) ×6 IMPLANT
GLOVE BIOGEL PI IND STRL 8.5 (GLOVE) ×1 IMPLANT
GLOVE BIOGEL PI INDICATOR 7.5 (GLOVE) ×12
GLOVE BIOGEL PI INDICATOR 8.5 (GLOVE) ×2
GLOVE ECLIPSE 8.0 STRL XLNG CF (GLOVE) ×6 IMPLANT
GLOVE ORTHO TXT STRL SZ7.5 (GLOVE) ×3 IMPLANT
GOWN STRL REUS W/TWL LRG LVL3 (GOWN DISPOSABLE) ×9 IMPLANT
GOWN STRL REUS W/TWL XL LVL3 (GOWN DISPOSABLE) ×6 IMPLANT
HOLDER FOLEY CATH W/STRAP (MISCELLANEOUS) ×3 IMPLANT
PACK ANTERIOR HIP CUSTOM (KITS) ×3 IMPLANT
SUT MNCRL AB 4-0 PS2 18 (SUTURE) ×3 IMPLANT
SUT STRATAFIX 0 PDS 27 VIOLET (SUTURE)
SUT STRATAFIX 1PDS 45CM VIOLET (SUTURE) ×3 IMPLANT
SUT STRATAFIX SPIRAL PDS+ 70CM (SUTURE) ×3
SUT VIC AB 1 CT1 36 (SUTURE) ×9 IMPLANT
SUT VIC AB 2-0 CT1 27 (SUTURE) ×4
SUT VIC AB 2-0 CT1 TAPERPNT 27 (SUTURE) ×2 IMPLANT
SUTURE STRATFX 0 PDS 27 VIOLET (SUTURE) IMPLANT
SUTURE STRATFX SPIRL PDS+ 70CM (SUTURE) ×1 IMPLANT
TRAY FOLEY W/METER SILVER 16FR (SET/KITS/TRAYS/PACK) ×3 IMPLANT
WATER STERILE IRR 1000ML POUR (IV SOLUTION) ×6 IMPLANT
YANKAUER SUCT BULB TIP 10FT TU (MISCELLANEOUS) IMPLANT

## 2017-11-21 NOTE — Interval H&P Note (Signed)
History and Physical Interval Note:  11/21/2017 7:07 AM  Justin Cohen  has presented today for surgery, with the diagnosis of Right hip osteoarthritis  The various methods of treatment have been discussed with the patient and family. After consideration of risks, benefits and other options for treatment, the patient has consented to  Procedure(s) with comments: RIGHT TOTAL HIP ARTHROPLASTY ANTERIOR APPROACH (Right) - 70 mins as a surgical intervention .  The patient's history has been reviewed, patient examined, no change in status, stable for surgery.  I have reviewed the patient's chart and labs.  Questions were answered to the patient's satisfaction.     [name]

## 2017-11-21 NOTE — Anesthesia Postprocedure Evaluation (Signed)
Anesthesia Post Note  Patient: Justin Cohen  Procedure(s) Performed: RIGHT TOTAL HIP ARTHROPLASTY ANTERIOR APPROACH (Right Hip)     Patient location during evaluation: PACU Anesthesia Type: Spinal Level of consciousness: oriented and awake and alert Pain management: pain level controlled Vital Signs Assessment: post-procedure vital signs reviewed and stable Respiratory status: spontaneous breathing, respiratory function stable and patient connected to nasal cannula oxygen Cardiovascular status: blood pressure returned to baseline and stable Postop Assessment: no headache, no backache and no apparent nausea or vomiting Anesthetic complications: no    Last Vitals:  Vitals:   11/21/17 0930 11/21/17 0945  BP: 138/70 135/75  Pulse: 79 79  Resp: 12 18  Temp:    SpO2: 96% 95%    Last Pain:  Vitals:   11/21/17 0548  TempSrc:   PainSc: 4     LLE Motor Response: No movement due to regional block (11/21/17 0945) LLE Sensation: No sensation (absent)(due to spinal) (11/21/17 0945) RLE Motor Response: No movement due to regional block (11/21/17 0945) RLE Sensation: No sensation (absent)(due to spinal) (11/21/17 0945) L Sensory Level: L1-Inguinal (groin) region (11/21/17 0945) R Sensory Level: L1-Inguinal (groin) region (11/21/17 0945)  Zareth Rippetoe S

## 2017-11-21 NOTE — Discharge Instructions (Signed)

## 2017-11-21 NOTE — Transfer of Care (Signed)
Immediate Anesthesia Transfer of Care Note  Patient: MALIKHI OGAN  Procedure(s) Performed: RIGHT TOTAL HIP ARTHROPLASTY ANTERIOR APPROACH (Right Hip)  Patient Location: PACU  Anesthesia Type:Spinal  Level of Consciousness: awake, alert  and oriented  Airway & Oxygen Therapy: Patient Spontanous Breathing and Patient connected to face mask oxygen  Post-op Assessment: Report given to RN  Post vital signs: Reviewed and stable  Last Vitals:  Vitals:   11/21/17 0535 11/21/17 0915  BP: (!) 157/80 134/72  Pulse: 99 84  Resp: 18 14  Temp: 36.8 C (P) 36.9 C  SpO2: 96% 96%    Last Pain:  Vitals:   11/21/17 0548  TempSrc:   PainSc: 4       Patients Stated Pain Goal: 4 (71/[id number])  Complications: No apparent anesthesia complications

## 2017-11-21 NOTE — Progress Notes (Signed)
Pt refused CPAP qhs.  Pt brought his home CPAP equipment in with him but wishes to just use his nasal cannula tonight while sleeping.  Education provided to the Pt and Pt encouraged to contact RT if he feels like he needs his CPAP later on throughout the night.

## 2017-11-21 NOTE — Op Note (Signed)
NAME:  Justin Cohen                ACCOUNT NO.: [id number]      MEDICAL RECORD NO.: [id number]      FACILITY:  [facility]      PHYSICIAN:  [name]  DATE OF BIRTH:  [birth year]     DATE OF PROCEDURE:  11/21/2017                                 OPERATIVE REPORT         PREOPERATIVE DIAGNOSIS: Right  hip osteoarthritis.      POSTOPERATIVE DIAGNOSIS:  Right hip osteoarthritis.      PROCEDURE:  Right total hip replacement through an anterior approach   utilizing DePuy THR system, component size 25mm pinnacle cup, a size 36+4 neutral   Altrex liner, a size 8 Hi Tri Lock stem with a 36+5 delta ceramic   ball.      SURGEON:  Pietro Cassis. Alvan Dame, M.D.      ASSISTANT:  Danae Orleans, PA-C     ANESTHESIA:  Spinal.      SPECIMENS:  None.      COMPLICATIONS:  None.      BLOOD LOSS:  350 cc     DRAINS:  None.      INDICATION OF THE PROCEDURE:  Justin Cohen is a 72 y.o. male who had   presented to office for evaluation of right hip pain.  Radiographs revealed   progressive degenerative changes with bone-on-bone   articulation to the  hip joint.  The patient had painful limited range of   motion significantly affecting their overall quality of life.  The patient was failing to    respond to conservative measures, and at this point was ready   to proceed with more definitive measures.  The patient has noted progressive   degenerative changes in his hip, progressive problems and dysfunction   with regarding the hip prior to surgery.  Consent was obtained for   benefit of pain relief.  Specific risk of infection, DVT, component   failure, dislocation, need for revision surgery, as well discussion of   the anterior versus posterior approach were reviewed.  Consent was   obtained for benefit of anterior pain relief through an anterior   approach.      PROCEDURE IN DETAIL:  The patient was brought to operative theater.   Once adequate anesthesia,  preoperative antibiotics, 2 gm of Ancef, 1 gm of Tranexamic Acid, and 10 mg of Decadron administered.   The patient was positioned supine on the OSI Hanna table.  Once adequate   padding of boney process was carried out, we had predraped out the hip, and  used fluoroscopy to confirm orientation of the pelvis and position.      The right hip was then prepped and draped from proximal iliac crest to   mid thigh with shower curtain technique.      Time-out was performed identifying the patient, planned procedure, and   extremity.     An incision was then made 2 cm distal and lateral to the   anterior superior iliac spine extending over the orientation of the   tensor fascia lata muscle and sharp dissection was carried down to the   fascia of the muscle and protractor placed in the soft tissues.      The  fascia was then incised.  The muscle belly was identified and swept   laterally and retractor placed along the superior neck.  Following   cauterization of the circumflex vessels and removing some pericapsular   fat, a second cobra retractor was placed on the inferior neck.  A third   retractor was placed on the anterior acetabulum after elevating the   anterior rectus.  A L-capsulotomy was along the line of the   superior neck to the trochanteric fossa, then extended proximally and   distally.  Tag sutures were placed and the retractors were then placed   intracapsular.  We then identified the trochanteric fossa and   orientation of my neck cut, confirmed this radiographically   and then made a neck osteotomy with the femur on traction.  The femoral   head was removed without difficulty or complication.  Traction was let   off and retractors were placed posterior and anterior around the   acetabulum.      The labrum and foveal tissue were debrided.  I began reaming with a 81mm   reamer and reamed up to 11mm reamer with good bony bed preparation and a 16mm   cup was chosen.  The final  67mm Pinnacle cup was then impacted under fluoroscopy  to confirm the depth of penetration and orientation with respect to   abduction.  A screw was placed followed by the hole eliminator.  The final   36+4 neutral Altrex liner was impacted with good visualized rim fit.  The cup was positioned anatomically within the acetabular portion of the pelvis.      At this point, the femur was rolled at 80 degrees.  Further capsule was   released off the inferior aspect of the femoral neck.  I then   released the superior capsule proximally.  The hook was placed laterally   along the femur and elevated manually and held in position with the bed   hook.  The leg was then extended and adducted with the leg rolled to 100   degrees of external rotation.  Once the proximal femur was fully   exposed, I used a box osteotome to set orientation.  I then began   broaching with the starting chili pepper broach and passed this by hand and then broached up to 8.  With the 8 broach in place I chose a highoffset neck and did several trial reductions.  The offset was appropriate, leg lengths   appeared to be equal best matched with the +5 head ball confirmed radiographically.   Given these findings, I went ahead and dislocated the hip, repositioned all   retractors and positioned the right hip in the extended and abducted position.  The final 8 Hi Tri Lock stem was   chosen and it was impacted down to the level of neck cut.  Based on this   and the trial reduction, a 36+5 delta ceramic ball was chosen and   impacted onto a clean and dry trunnion, and the hip was reduced.  The   hip had been irrigated throughout the case again at this point.  I did   reapproximate the superior capsular leaflet to the anterior leaflet   using #1 Vicryl.  The fascia of the   tensor fascia lata muscle was then reapproximated using #1 Vicryl and #1 Stratafix suture.  The   remaining wound was closed with 2-0 Vicryl and running 4-0 Monocryl.    The hip was cleaned, dried,  and dressed sterilely using Dermabond and   Aquacel dressing.  He was then brought   to recovery room in stable condition tolerating the procedure well.    Danae Orleans, PA-C was present for the entirety of the case involved from   preoperative positioning, perioperative retractor management, general   facilitation of the case, as well as primary wound closure as assistant.            Pietro Cassis Alvan Dame, M.D.        11/21/2017 8:45 AM

## 2017-11-21 NOTE — Evaluation (Signed)
Physical Therapy Evaluation Patient Details Name: Justin Cohen MRN: [id number] DOB: 1946-10-01 Today's Date: 11/21/2017   History of Present Illness  Pt is a 72 yo male s/p L THA, direct anterior approach.  Clinical Impression  Pt is s/p L THA resulting in the deficits listed below (See PT Problem List). Pt will benefit from skilled PT to increase their independence and safety with mobility to allow discharge. Pt has 4-wheel walker at home for hip pain prior to surgery, but may need RW for stability. Reports possible d/c to home tomorrow with spouse.       Follow Up Recommendations DC plan and follow up therapy as arranged by surgeon;No PT follow up    Equipment Recommendations  Rolling walker with 5" wheels    Recommendations for Other Services       Precautions / Restrictions Precautions Precautions: None;Fall Restrictions Weight Bearing Restrictions: No RLE Weight Bearing: Weight bearing as tolerated      Mobility  Bed Mobility Overal bed mobility: Needs Assistance Bed Mobility: Supine to Sit     Supine to sit: Min assist;HOB elevated     General bed mobility comments: assist for trunk lift off, cues for UE placement  Transfers Overall transfer level: Needs assistance Equipment used: Rolling walker (2 wheeled) Transfers: Sit to/from Stand Sit to Stand: Min assist         General transfer comment: verbal cues for UE/LE placement, safety  Ambulation/Gait Ambulation/Gait assistance: Min assist Ambulation Distance (Feet): 80 Feet Assistive device: Rolling walker (2 wheeled) Gait Pattern/deviations: Step-through pattern;Antalgic;Decreased stance time - right     General Gait Details: verbal cues for sequence, RW positioning, posture and safe speed  Stairs            Wheelchair Mobility    Modified Rankin (Stroke Patients Only)       Balance                                             Pertinent Vitals/Pain Pain  Assessment: 0-10 Pain Score: 4  Pain Location: R hip Pain Descriptors / Indicators: Aching;Sore Pain Intervention(s): Limited activity within patient's tolerance;Monitored during session;Ice applied;Premedicated before session;Repositioned    Home Living Family/patient expects to be discharged to:: Private residence Living Arrangements: Spouse/significant other Available Help at Discharge: Family Type of Home: House Home Access: Stairs to enter Entrance Stairs-Rails: Right Entrance Stairs-Number of Steps: 3 Home Layout: One level Home Equipment: Environmental consultant - 4 wheels      Prior Function Level of Independence: Independent with assistive device(s)         Comments: 4 wheel walker for R hip pain     Hand Dominance        Extremity/Trunk Assessment        Lower Extremity Assessment Lower Extremity Assessment: RLE deficits/detail RLE Deficits / Details: Anticipated post op weakness, especially hip flexion/abduction; able to do ankle pumps    Cervical / Trunk Assessment Cervical / Trunk Assessment: Normal  Communication   Communication: No difficulties  Cognition Arousal/Alertness: Awake/alert Behavior During Therapy: WFL for tasks assessed/performed Overall Cognitive Status: Within Functional Limits for tasks assessed                                 General Comments: Pt talkative and very pleasant  General Comments      Exercises Total Joint Exercises Ankle Circles/Pumps: AROM;10 reps;Both   Assessment/Plan    PT Assessment Patient needs continued PT services  PT Problem List Decreased strength;Decreased mobility;Decreased range of motion;Decreased knowledge of precautions;Decreased activity tolerance;Decreased knowledge of use of DME;Pain       PT Treatment Interventions DME instruction;Therapeutic activities;Gait training;Therapeutic exercise;Patient/family education;Stair training;Functional mobility training    PT Goals (Current goals  can be found in the Care Plan section)  Acute Rehab PT Goals Patient Stated Goal: to return home PT Goal Formulation: With patient/family Time For Goal Achievement: 11/28/17 Potential to Achieve Goals: Good    Frequency 7X/week   Barriers to discharge        Co-evaluation               AM-PAC PT "6 Clicks" Daily Activity  Outcome Measure Difficulty turning over in bed (including adjusting bedclothes, sheets and blankets)?: Unable Difficulty moving from lying on back to sitting on the side of the bed? : Unable Difficulty sitting down on and standing up from a chair with arms (e.g., wheelchair, bedside commode, etc,.)?: Unable Help needed moving to and from a bed to chair (including a wheelchair)?: A Lot Help needed walking in hospital room?: A Little Help needed climbing 3-5 steps with a railing? : A Lot 6 Click Score: 10    End of Session Equipment Utilized During Treatment: Gait belt Activity Tolerance: Patient tolerated treatment well Patient left: in chair;with chair alarm set;with family/visitor present;with call bell/phone within reach Nurse Communication: Mobility status PT Visit Diagnosis: Other abnormalities of gait and mobility (R26.89)    Time: 8938-1017 PT Time Calculation (min) (ACUTE ONLY): 19 min   Charges:   PT Evaluation $PT Eval Low Complexity: 1 Low     PT G Codes:        [place] Jamisha Hoeschen, SPT   [place] Bijal Siglin 11/21/2017, 5:10 PM

## 2017-11-21 NOTE — Anesthesia Preprocedure Evaluation (Signed)
Anesthesia Evaluation  Patient identified by MRN, date of birth, ID band Patient awake    Reviewed: Allergy & Precautions, NPO status , Patient's Chart, lab work & pertinent test results  Airway Mallampati: II  TM Distance: >3 FB Neck ROM: Full    Dental no notable dental hx.    Pulmonary neg pulmonary ROS, former smoker,    Pulmonary exam normal breath sounds clear to auscultation       Cardiovascular hypertension, Normal cardiovascular exam Rhythm:Regular Rate:Normal     Neuro/Psych negative neurological ROS  negative psych ROS   GI/Hepatic negative GI ROS, Neg liver ROS,   Endo/Other  diabetesMorbid obesity  Renal/GU negative Renal ROS  negative genitourinary   Musculoskeletal negative musculoskeletal ROS (+)   Abdominal   Peds negative pediatric ROS (+)  Hematology negative hematology ROS (+)   Anesthesia Other Findings   Reproductive/Obstetrics negative OB ROS                             Anesthesia Physical Anesthesia Plan  ASA: III  Anesthesia Plan: Spinal   Post-op Pain Management:    Induction: Intravenous  PONV Risk Score and Plan: 1  Airway Management Planned: Simple Face Mask  Additional Equipment:   Intra-op Plan:   Post-operative Plan:   Informed Consent: I have reviewed the patients History and Physical, chart, labs and discussed the procedure including the risks, benefits and alternatives for the proposed anesthesia with the patient or authorized representative who has indicated his/her understanding and acceptance.   Dental advisory given  Plan Discussed with: CRNA and Surgeon  Anesthesia Plan Comments:         Anesthesia Quick Evaluation

## 2017-11-22 ENCOUNTER — Encounter (HOSPITAL_COMMUNITY): Payer: Self-pay | Admitting: Orthopedic Surgery

## 2017-11-22 LAB — CBC
HCT: 36.5 % — ABNORMAL LOW (ref 39.0–52.0)
Hemoglobin: 12.1 g/dL — ABNORMAL LOW (ref 13.0–17.0)
MCH: 28.7 pg (ref 26.0–34.0)
MCHC: 33.2 g/dL (ref 30.0–36.0)
MCV: 86.7 fL (ref 78.0–100.0)
PLATELETS: 330 10*3/uL (ref 150–400)
RBC: 4.21 MIL/uL — ABNORMAL LOW (ref 4.22–5.81)
RDW: 15.8 % — AB (ref 11.5–15.5)
WBC: 13.1 10*3/uL — ABNORMAL HIGH (ref 4.0–10.5)

## 2017-11-22 LAB — BASIC METABOLIC PANEL
Anion gap: 8 (ref 5–15)
BUN: 19 mg/dL (ref 6–20)
CO2: 24 mmol/L (ref 22–32)
Calcium: 9 mg/dL (ref 8.9–10.3)
Chloride: 104 mmol/L (ref 101–111)
Creatinine, Ser: 0.96 mg/dL (ref 0.61–1.24)
GFR calc Af Amer: >60 mL/min (ref >60)
GLUCOSE: 180 mg/dL — AB (ref 65–99)
Potassium: 4.6 mmol/L (ref 3.5–5.1)
SODIUM: 136 mmol/L (ref 135–145)

## 2017-11-22 LAB — GLUCOSE, CAPILLARY
GLUCOSE-CAPILLARY: 193 mg/dL — AB (ref 65–99)
Glucose-Capillary: 245 mg/dL — ABNORMAL HIGH (ref 65–99)

## 2017-11-22 NOTE — Evaluation (Signed)
Occupational Therapy Evaluation Patient Details Name: Justin Cohen MRN: [id number] DOB: Aug 21, 1946 Today's Date: 11/22/2017    History of Present Illness Pt is a 72 yo male s/p L THA, direct anterior approach.   Clinical Impression   Pt was admitted for the above.  He has a h/o knee sx in '16 and has DME from this. All education was completed.  No further OT is needed at this time    Follow Up Recommendations  Supervision/Assistance - 24 hour    Equipment Recommendations  None recommended by OT    Recommendations for Other Services       Precautions / Restrictions Precautions Precautions: Fall Restrictions Weight Bearing Restrictions: No RLE Weight Bearing: Weight bearing as tolerated      Mobility Bed Mobility         Supine to sit: Supervision     General bed mobility comments: HOB raised  Transfers   Equipment used: Rolling walker (2 wheeled)   Sit to Stand: Min guard         General transfer comment: cues for UE placement    Balance                                           ADL either performed or assessed with clinical judgement   ADL Overall ADL's : Needs assistance/impaired             Lower Body Bathing: Minimal assistance;Sit to/from stand       Lower Body Dressing: Moderate assistance;Sit to/from stand   Toilet Transfer: Min guard;Ambulation;BSC;RW       Tub/ Shower Transfer: Gaffer;Ambulation;Min guard;Rolling walker     General ADL Comments: pt can perform UB adls with set up.  Wife can assist as needed.       Vision         Perception     Praxis      Pertinent Vitals/Pain Pain Score: 2  Pain Location: R hip Pain Descriptors / Indicators: Aching;Sore Pain Intervention(s): Limited activity within patient's tolerance;Monitored during session;Repositioned;Ice applied     Hand Dominance     Extremity/Trunk Assessment Upper Extremity Assessment Upper Extremity Assessment: Overall  WFL for tasks assessed           Communication Communication Communication: No difficulties   Cognition Arousal/Alertness: Awake/alert Behavior During Therapy: WFL for tasks assessed/performed Overall Cognitive Status: Within Functional Limits for tasks assessed                                     General Comments       Exercises     Shoulder Instructions      Home Living Family/patient expects to be discharged to:: Private residence Living Arrangements: Spouse/significant other Available Help at Discharge: Family               Bathroom Shower/Tub: Occupational psychologist: Standard     Home Equipment: Bedside commode;Walker - 4 wheels   Additional Comments: wife works      Prior Functioning/Environment Level of Independence: Independent with assistive device(s)        Comments: 4 wheel walker for R hip pain        OT Problem List:        OT Treatment/Interventions:  OT Goals(Current goals can be found in the care plan section) Acute Rehab OT Goals Patient Stated Goal: to return home OT Goal Formulation: All assessment and education complete, DC therapy  OT Frequency:     Barriers to D/C:            Co-evaluation              AM-PAC PT "6 Clicks" Daily Activity     Outcome Measure Help from another person eating meals?: None Help from another person taking care of personal grooming?: A Little Help from another person toileting, which includes using toliet, bedpan, or urinal?: A Little Help from another person bathing (including washing, rinsing, drying)?: A Little Help from another person to put on and taking off regular upper body clothing?: A Little Help from another person to put on and taking off regular lower body clothing?: A Lot 6 Click Score: 18   End of Session    Activity Tolerance: Patient tolerated treatment well Patient left: in chair;with call bell/phone within reach;with chair alarm  set  OT Visit Diagnosis: Pain Pain - Right/Left: Right Pain - part of body: Hip                Time: 0833-0856(Matt Babish in during session) OT Time Calculation (min): 23 min Charges:  OT General Charges $OT Visit: 1 Visit OT Evaluation $OT Eval Low Complexity: 1 Low G-Codes:     Rock Hall, OTR/L [phone] 11/22/2017  Charizma Gardiner 11/22/2017, 10:42 AM

## 2017-11-22 NOTE — Progress Notes (Signed)
Physical Therapy Treatment Patient Details Name: Justin Cohen MRN: [id number] DOB: 02-Dec-1945 Today's Date: 11/22/2017    History of Present Illness Pt is a 72 yo male s/p L THA, direct anterior approach.    PT Comments    Pt requested to do 1 session only today to d/c by noon. Ambulated with RW to stairs. Educated on safe stair technique with spouse observing. Discussed safe use of 4-wheel walker that pt had used previously. Educated and performed exercises, given handout and discussed progression. Also stressed the importance of slowing down with mobility. Pt had no further questions.   Follow Up Recommendations  DC plan and follow up therapy as arranged by surgeon;No PT follow up     Equipment Recommendations  None recommended by PT    Recommendations for Other Services       Precautions / Restrictions Precautions Precautions: Fall Restrictions RLE Weight Bearing: Weight bearing as tolerated    Mobility  Bed Mobility         Supine to sit: Supervision     General bed mobility comments: Pt in recliner upon arrival  Transfers Overall transfer level: Needs assistance Equipment used: Rolling walker (2 wheeled) Transfers: Sit to/from Stand Sit to Stand: Supervision         General transfer comment: cues for UE/LE placement, RW position  Ambulation/Gait Ambulation/Gait assistance: Supervision Ambulation Distance (Feet): 200 Feet Assistive device: Rolling walker (2 wheeled) Gait Pattern/deviations: Step-through pattern;Antalgic;Decreased stance time - right     General Gait Details: verbal cues for sequence, RW positioning, safety (speed)   Stairs Stairs: Yes   Stair Management: Step to pattern;Forwards;One rail Right Number of Stairs: 3 General stair comments: Repeated x2, verbal cues on sequence, safety and weight shift, spouse observed  Wheelchair Mobility    Modified Rankin (Stroke Patients Only)       Balance                                            Cognition Arousal/Alertness: Awake/alert Behavior During Therapy: WFL for tasks assessed/performed Overall Cognitive Status: Within Functional Limits for tasks assessed                                        Exercises Total Joint Exercises Ankle Circles/Pumps: AROM;20 reps;Both Hip ABduction/ADduction: AROM;10 reps;Right;Standing(all standing exercises performed with UE support) Long Arc Quad: AROM;Right;10 reps;Seated Knee Flexion: AROM;Standing;10 reps Marching in Standing: AROM;10 reps;Right;Standing Standing Hip Extension: AROM;Right;10 reps;Standing General Exercises - Lower Extremity Heel Raises: AROM;10 reps;Both    General Comments        Pertinent Vitals/Pain Pain Assessment: 0-10 Pain Score: 4  Pain Location: R hip Pain Descriptors / Indicators: Aching;Sore Pain Intervention(s): Limited activity within patient's tolerance;Repositioned;Ice applied;Monitored during session;Premedicated before session    Home Living Family/patient expects to be discharged to:: Private residence Living Arrangements: Spouse/significant other Available Help at Discharge: Family         Home Equipment: Bedside commode;Walker - 4 wheels Additional Comments: wife works    Prior Function Level of Independence: Independent with assistive device(s)      Comments: 4 wheel walker for R hip pain   PT Goals (current goals can now be found in the care plan section) Acute Rehab PT Goals Patient Stated Goal: to return home Progress  towards PT goals: Progressing toward goals    Frequency    7X/week      PT Plan Current plan remains appropriate    Co-evaluation              AM-PAC PT "6 Clicks" Daily Activity  Outcome Measure  Difficulty turning over in bed (including adjusting bedclothes, sheets and blankets)?: A Little Difficulty moving from lying on back to sitting on the side of the bed? : A Little Difficulty sitting  down on and standing up from a chair with arms (e.g., wheelchair, bedside commode, etc,.)?: A Little Help needed moving to and from a bed to chair (including a wheelchair)?: A Little Help needed walking in hospital room?: A Little Help needed climbing 3-5 steps with a railing? : A Little 6 Click Score: 18    End of Session   Activity Tolerance: Patient tolerated treatment well Patient left: in chair;with call bell/phone within reach;with family/visitor present Nurse Communication: Mobility status PT Visit Diagnosis: Other abnormalities of gait and mobility (R26.89)     Time: 9509-3267 PT Time Calculation (min) (ACUTE ONLY): 23 min  Charges:  $Gait Training: 8-22 mins $Therapeutic Exercise: 8-22 mins                    G Codes:      [place] Amiee Wiley, SPT  [place] Tekelia Kareem 11/22/2017, 12:32 PM

## 2017-11-22 NOTE — Progress Notes (Signed)
Reviewed discharge information with patient and wife. All questions answered. Both verbalized understanding. Patient to D/C after lunch meal complete.

## 2017-11-22 NOTE — Progress Notes (Signed)
     Subjective: 1 Day Post-Op Procedure(s) (LRB): RIGHT TOTAL HIP ARTHROPLASTY ANTERIOR APPROACH (Right)   Patient reports pain as mild, pain controlled. No events throughout the night. Many questions asked and answered.  Procedure was discussed at length.  Discussed restrictions and activity levels at home.   Objective:   VITALS:   Vitals:   11/22/17 0115 11/22/17 0541  BP: (!) 133/51 140/71  Pulse: (!) 58 78  Resp: 18 16  Temp: 98.2 F (36.8 C) 98.2 F (36.8 C)  SpO2: 96% 95%    Dorsiflexion/Plantar flexion intact Incision: dressing C/D/I No cellulitis present Compartment soft  LABS Recent Labs    11/22/17 0541  HGB 12.1*  HCT 36.5*  WBC 13.1*  PLT 330    Recent Labs    11/22/17 0541  NA 136  K 4.6  BUN 19  CREATININE 0.96  GLUCOSE 180*     Assessment/Plan: 1 Day Post-Op Procedure(s) (LRB): RIGHT TOTAL HIP ARTHROPLASTY ANTERIOR APPROACH (Right) Foley cath d/c'ed Advance diet Up with therapy D/C IV fluids Discharge home Follow up in 2 weeks at [facility]. Follow up with OLIN,Jonny Longino D in 2 weeks.  Contact information:  [facility] [address] [place] [address] [phone]    Morbid Obesity (BMI >40)  Estimated body mass index is 41.5 kg/m as calculated from the following:   Height as of this encounter: 5\' 9"  (1.753 m).   Weight as of this encounter: 127.5 kg (281 lb). Patient also counseled that weight may inhibit the healing process Patient counseled that losing weight will help with future health issues      West Pugh. Uchechukwu Dhawan   PAC  11/22/2017, 9:39 AM

## 2017-11-22 NOTE — Discharge Summary (Signed)
Physician Discharge Summary  Patient ID: LANE ELAND MRN: [id number] DOB/AGE: 13-Jan-1946 72 y.o.  Admit date: 11/21/2017 Discharge date: 11/22/2017   Procedures:  Procedure(s) (LRB): RIGHT TOTAL HIP ARTHROPLASTY ANTERIOR APPROACH (Right)  Attending Physician:  Dr. Paralee Cancel   Admission Diagnoses:   Right hip primary OA / pain  Discharge Diagnoses:  Principal Problem:   S/P right THA, AA Active Problems:   Morbid obesity ([place])  Past Medical History:  Diagnosis Date  . Arthritis   . Cancer [facility])    prostate cancer , basal cell skin cancer removed from nose 10/2014   . Cancer [facility])    prostate cancer  . Cancer ([place])    melanoma on nose  . Complication of anesthesia   . Diabetes mellitus without complication ([place])    TYPE 2   . Environmental allergies   . Glaucoma   . Hyperlipidemia   . Hypertension   . PND (post-nasal drip)    SYMPTOMS OF COLD ONSET 2 WEEKS AGO ; HAS HAD DRY COUGH ,MENTIONS LESS COUGHING TODAY NO EXPETORANT ; AFEBRILE ; USING NASAL WASHES FOR PND   . Sinus problem   . Sleep apnea    cpap- setting at 10   . Sleep apnea     HPI:    Justin Cohen, 72 y.o. male, has a history of pain and functional disability in the right hip(s) due to arthritis and patient has failed non-surgical conservative treatments for greater than 12 weeks to include NSAID's and/or analgesics, corticosteriod injections, use of assistive devices and activity modification.  Onset of symptoms was gradual starting <1 years ago with rapidlly worsening course since that time.The patient noted no past surgery on the right hip(s).  Patient currently rates pain in the right hip at 9 out of 10 with activity. Patient has night pain, worsening of pain with activity and weight bearing, trendelenberg gait, pain that interfers with activities of daily living and pain with passive range of motion. Patient has evidence of periarticular osteophytes and joint space narrowing by imaging  studies. This condition presents safety issues increasing the risk of falls.  There is no current active infection.  Risks, benefits and expectations were discussed with the patient.  Risks including but not limited to the risk of anesthesia, blood clots, nerve damage, blood vessel damage, failure of the prosthesis, infection and up to and including death.  Patient understand the risks, benefits and expectations and wishes to proceed with surgery.   PCP: Antony Contras, MD   Discharged Condition: good  Hospital Course:  Patient underwent the above stated procedure on 11/21/2017. Patient tolerated the procedure well and brought to the recovery room in good condition and subsequently to the floor.  POD #1 BP: 140/71 ; Pulse: 78 ; Temp: 98.2 F (36.8 C) ; Resp: 16 Patient reports pain as mild, pain controlled. No events throughout the night. Many questions asked and answered.  Procedure was discussed at length.  Discussed restrictions and activity levels at home.  Dorsiflexion/plantar flexion intact, incision: dressing C/D/I, no cellulitis present and compartment soft.   LABS  Basename    HGB     12.1  HCT     36.5    Discharge Exam: General appearance: alert, cooperative and no distress Extremities: Homans sign is negative, no sign of DVT, no edema, redness or tenderness in the calves or thighs and no ulcers, gangrene or trophic changes  Disposition: Home with follow up in 2 weeks   Follow-up Information  Paralee Cancel, MD. Schedule an appointment as soon as possible for a visit in 2 week(s).   Specialty:  Orthopedic Surgery Contact information: [address] [place] [address] [phone]           Discharge Instructions    Call MD / Call 911   Complete by:  As directed    If you experience chest pain or shortness of breath, CALL 911 and be transported to the hospital emergency room.  If you develope a fever above 101 F, pus (white drainage) or  increased drainage or redness at the wound, or calf pain, call your surgeon's office.   Change dressing   Complete by:  As directed    Maintain surgical dressing until follow up in the clinic. If the edges start to pull up, may reinforce with tape. If the dressing is no longer working, may remove and cover with gauze and tape, but must keep the area dry and clean.  Call with any questions or concerns.   Constipation Prevention   Complete by:  As directed    Drink plenty of fluids.  Prune juice may be helpful.  You may use a stool softener, such as Colace (over the counter) 100 mg twice a day.  Use MiraLax (over the counter) for constipation as needed.   Diet - low sodium heart healthy   Complete by:  As directed    Discharge instructions   Complete by:  As directed    Maintain surgical dressing until follow up in the clinic. If the edges start to pull up, may reinforce with tape. If the dressing is no longer working, may remove and cover with gauze and tape, but must keep the area dry and clean.  Follow up in 2 weeks at [facility]. Call with any questions or concerns.   Increase activity slowly as tolerated   Complete by:  As directed    Weight bearing as tolerated with assist device (walker, cane, etc) as directed, use it as long as suggested by your surgeon or therapist, typically at least 4-6 weeks.   TED hose   Complete by:  As directed    Use stockings (TED hose) for 2 weeks on both leg(s).  You may remove them at night for sleeping.      Allergies as of 11/22/2017   No Known Allergies     Medication List    STOP taking these medications   aspirin 81 MG tablet Replaced by:  aspirin 81 MG chewable tablet   diclofenac 75 MG EC tablet Commonly known as:  VOLTAREN   saccharomyces boulardii 250 MG capsule Commonly known as:  FLORASTOR     TAKE these medications   amLODipine-benazepril 5-10 MG capsule Commonly known as:  LOTREL Take 1 capsule by mouth every  morning.   aspirin 81 MG chewable tablet Commonly known as:  ASPIRIN CHILDRENS Chew 1 tablet (81 mg total) by mouth 2 (two) times daily. Take for 4 weeks, then resume regular dose. Replaces:  aspirin 81 MG tablet   cetirizine 10 MG tablet Commonly known as:  ZYRTEC Take 10 mg by mouth daily.   docusate sodium 100 MG capsule Commonly known as:  COLACE Take 1 capsule (100 mg total) by mouth 2 (two) times daily.   ferrous sulfate 325 (65 FE) MG tablet Commonly known as:  FERROUSUL Take 1 tablet (325 mg total) by mouth 3 (three) times daily with meals.   Fish Oil 1000 MG Caps Take 1,000  mg by mouth daily.   Fish Oil 1000 MG Caps Take 1,000 mg by mouth daily.   fluticasone 50 MCG/ACT nasal spray Commonly known as:  FLONASE Place 2 sprays into both nostrils daily.   glipiZIDE 5 MG tablet Commonly known as:  GLUCOTROL Take 5 mg by mouth daily before breakfast.   HYDROcodone-acetaminophen 7.5-325 MG tablet Commonly known as:  NORCO Take 1-2 tablets by mouth every 4 (four) hours as needed for moderate pain or severe pain.   lansoprazole 15 MG capsule Commonly known as:  PREVACID Take 15 mg by mouth every morning.   latanoprost 0.005 % ophthalmic solution Commonly known as:  XALATAN Place 1 drop into both eyes at bedtime.   metFORMIN 1000 MG tablet Commonly known as:  GLUCOPHAGE Take 1,000 mg by mouth 2 (two) times daily with a meal.   methocarbamol 500 MG tablet Commonly known as:  ROBAXIN Take 1 tablet (500 mg total) by mouth every 6 (six) hours as needed for muscle spasms.   multivitamin with minerals Tabs tablet Take 1 tablet by mouth daily.   pioglitazone 45 MG tablet Commonly known as:  ACTOS Take 45 mg by mouth daily.   polyethylene glycol packet Commonly known as:  MIRALAX / GLYCOLAX Take 17 g by mouth 2 (two) times daily.   PROBIOTIC DAILY PO Take 1 capsule by mouth daily.   rosuvastatin 20 MG tablet Commonly known as:  CRESTOR Take 5 mg by mouth  every evening.   venlafaxine XR 75 MG 24 hr capsule Commonly known as:  EFFEXOR-XR Take 75 mg by mouth daily with breakfast.   Vitamin D 2000 units tablet Take 2,000 Units by mouth daily.            Discharge Care Instructions  (From admission, onward)        Start     Ordered   11/22/17 0000  Change dressing    Comments:  Maintain surgical dressing until follow up in the clinic. If the edges start to pull up, may reinforce with tape. If the dressing is no longer working, may remove and cover with gauze and tape, but must keep the area dry and clean.  Call with any questions or concerns.   11/22/17 [id number]       Signed: [place] P[place]. Jaidan Stachnik   PA-C  11/22/2017, 3:11 PM

## 2017-11-24 ENCOUNTER — Other Ambulatory Visit: Payer: Self-pay

## 2017-11-24 NOTE — Patient Outreach (Signed)
[facility]) Care Management  11/24/2017  Justin Cohen 1945/11/21 [id number]   Telephone call to patient for transition of care assessment.  Patient answered and acknowledged that I was speaking with him. Advised patient on who Cm was and were I was calling from. Patient immediately states that he is ok and hung up.    Plan: RN CM will close case and notify care management assistant of case status.   Jone Baseman, RN, MSN [facility] Care Management Care Management Coordinator Direct Line [phone] Toll Free: [phone]  Fax: [phone]

## 2017-12-04 DIAGNOSIS — H2513 Age-related nuclear cataract, bilateral: Secondary | ICD-10-CM | POA: Diagnosis not present

## 2017-12-04 DIAGNOSIS — H401131 Primary open-angle glaucoma, bilateral, mild stage: Secondary | ICD-10-CM | POA: Diagnosis not present

## 2017-12-04 DIAGNOSIS — H25013 Cortical age-related cataract, bilateral: Secondary | ICD-10-CM | POA: Diagnosis not present

## 2018-01-01 DIAGNOSIS — Z96641 Presence of right artificial hip joint: Secondary | ICD-10-CM | POA: Diagnosis not present

## 2018-01-01 DIAGNOSIS — Z471 Aftercare following joint replacement surgery: Secondary | ICD-10-CM | POA: Diagnosis not present

## 2018-02-14 DIAGNOSIS — Z471 Aftercare following joint replacement surgery: Secondary | ICD-10-CM | POA: Diagnosis not present

## 2018-02-14 DIAGNOSIS — Z96641 Presence of right artificial hip joint: Secondary | ICD-10-CM | POA: Diagnosis not present

## 2018-03-07 ENCOUNTER — Ambulatory Visit: Payer: Medicare HMO | Admitting: Sports Medicine

## 2018-03-07 ENCOUNTER — Encounter: Payer: Self-pay | Admitting: Sports Medicine

## 2018-03-07 DIAGNOSIS — M79671 Pain in right foot: Secondary | ICD-10-CM

## 2018-03-07 DIAGNOSIS — B351 Tinea unguium: Secondary | ICD-10-CM | POA: Diagnosis not present

## 2018-03-07 DIAGNOSIS — L84 Corns and callosities: Secondary | ICD-10-CM

## 2018-03-07 DIAGNOSIS — E1142 Type 2 diabetes mellitus with diabetic polyneuropathy: Secondary | ICD-10-CM

## 2018-03-07 DIAGNOSIS — M79672 Pain in left foot: Secondary | ICD-10-CM

## 2018-03-07 DIAGNOSIS — M79674 Pain in right toe(s): Secondary | ICD-10-CM

## 2018-03-07 DIAGNOSIS — M79675 Pain in left toe(s): Secondary | ICD-10-CM

## 2018-03-07 NOTE — Progress Notes (Signed)
Subjective: Justin Cohen is a 72 y.o. male patient with history of diabetes who presents to office today for diabetic foot care and for new diabetic shoes/inserts.  Patient reports that his past insoles seem like they helped some and would like him use it because his current shoes are very worn.  Reports that his last A1c was 6.9 and that he does not check his blood sugars on a regular basis saw his primary care doctor 4 months ago. Patient denies any new issues.  Patient Active Problem List   Diagnosis Date Noted  . S/P right THA, AA 11/21/2017  . 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 Malnutrition of moderate degree ([place]) 04/02/2015  . Abscess   . Hyperlipidemia 03/31/2015  . Pericholecystic abscess 03/31/2015  . Postoperative wound abscess 03/31/2015  . Cholecystitis, acute 03/13/2015  . Sepsis due to Escherichia coli ([place]) 03/11/2015  . Choledocholithiasis 03/08/2015  . Cholangitis 03/06/2015  . HTN (hypertension) 03/06/2015  . DM w/o complication type II ([place]) 03/06/2015  . Encephalopathy due to infection 03/06/2015  . Sleep apnea 03/06/2015  . Morbid obesity ([place]) 02/18/2015  . S/P left TKA 02/17/2015   Current Outpatient Medications on File Prior to Visit  Medication Sig Dispense Refill  . amLODipine-benazepril (LOTREL) 5-10 MG per capsule Take 1 capsule by mouth every morning.     . cetirizine (ZYRTEC) 10 MG tablet Take 10 mg by mouth daily.    . Cholecalciferol (VITAMIN D) 2000 UNITS tablet Take 2,000 Units by mouth daily.    Marland Kitchen docusate sodium (COLACE) 100 MG capsule Take 1 capsule (100 mg total) by mouth 2 (two) times daily. 10 capsule 0  . ferrous sulfate (FERROUSUL) 325 (65 FE) MG tablet Take 1 tablet (325 mg total) by mouth 3 (three) times daily with meals.  3  . fluticasone (FLONASE) 50 MCG/ACT nasal spray Place 2 sprays into both nostrils daily.     Marland Kitchen glipiZIDE (GLUCOTROL) 5 MG tablet Take 5 mg by mouth daily before breakfast.     . HYDROcodone-acetaminophen  (NORCO) 7.5-325 MG tablet Take 1-2 tablets by mouth every 4 (four) hours as needed for moderate pain or severe pain. 60 tablet 0  . lansoprazole (PREVACID) 15 MG capsule Take 15 mg by mouth every morning.     . latanoprost (XALATAN) 0.005 % ophthalmic solution Place 1 drop into both eyes at bedtime.     . metFORMIN (GLUCOPHAGE) 1000 MG tablet Take 1,000 mg by mouth 2 (two) times daily with a meal.    . methocarbamol (ROBAXIN) 500 MG tablet Take 1 tablet (500 mg total) by mouth every 6 (six) hours as needed for muscle spasms. 40 tablet 0  . Multiple Vitamin (MULTIVITAMIN WITH MINERALS) TABS tablet Take 1 tablet by mouth daily.     . Omega-3 Fatty Acids (FISH OIL) 1000 MG CAPS Take 1,000 mg by mouth daily.     . Omega-3 Fatty Acids (FISH OIL) 1000 MG CAPS Take 1,000 mg by mouth daily.    . pioglitazone (ACTOS) 45 MG tablet Take 45 mg by mouth daily.     . polyethylene glycol (MIRALAX / GLYCOLAX) packet Take 17 g by mouth 2 (two) times daily. 14 each 0  . Probiotic Product (PROBIOTIC DAILY PO) Take 1 capsule by mouth daily.    . rosuvastatin (CRESTOR) 20 MG tablet Take 5 mg by mouth every evening.     . venlafaxine XR (EFFEXOR-XR) 75 MG 24 hr capsule Take 75 mg by mouth daily with breakfast.  No current facility-administered medications on file prior to visit.    No Known Allergies  No results found for this or any previous visit (from the past 2160 hour(s)).  Objective: General: Patient is awake, alert, and oriented x 3 and in no acute distress.  Integument: Skin is warm, dry and supple bilateral. Nails are long, thickened and dystrophic with subungual debris, consistent with onychomycosis, 1-5 bilateral. No signs of infection. No open lesions or preulcerative lesions present bilateral. Mild reactive callus at 1st toes. Remaining integument unremarkable.  Vasculature:  Dorsalis Pedis pulse 2/4 bilateral. Posterior Tibial pulse 1 /4 bilateral.  Capillary fill time <3 sec 1-5 bilateral.  Positive hair growth to the level of the digits. Temperature gradient within normal limits. No varicosities present bilateral. No edema present bilateral.   Neurology: The patient has intact sensation measured with a 5.07/10g Semmes Weinstein Monofilament at all pedal sites bilateral . Vibratory sensation diminished bilateral with tuning fork. No Babinski sign present bilateral.   Musculoskeletal: Asymptomatic bunion L>R and pes planus pedal deformities noted bilateral. Muscular strength 5/5 in all lower extremity muscular groups bilateral without pain on range of motion . No tenderness with calf compression bilateral.  Assessment and Plan: Problem List Items Addressed This Visit    None    Visit Diagnoses    Pain due to onychomycosis of toenails of both feet    -  Primary   Foot pain, bilateral       Diabetic polyneuropathy associated with type 2 diabetes mellitus (HCC)       Callus of foot          -Examined patient. -Discussed and educated patient on diabetic foot care, especially with  regards to the vascular, neurological and musculoskeletal systems.  -Stressed the importance of good glycemic control and the detriment of not controlling glucose levels in relation to the foot. -Mechanically debrided all nails using a sterile nail nipper and smooth callused areas using a rotary bur without incident -Safe step diabetic shoe order form was completed; office to contact primary care for approval / certification;  Office to arrange shoe fitting and dispensing. -Answered all patient questions -Patient to return for diabetic shoes and inserts and routine foot care as scheduled -Patient advised to call the office if any problems or questions arise in the meantime.  Landis Martins, DPM

## 2018-03-08 DIAGNOSIS — G4733 Obstructive sleep apnea (adult) (pediatric): Secondary | ICD-10-CM | POA: Diagnosis not present

## 2018-03-22 ENCOUNTER — Ambulatory Visit: Payer: Medicare HMO | Admitting: *Deleted

## 2018-03-22 DIAGNOSIS — E1142 Type 2 diabetes mellitus with diabetic polyneuropathy: Secondary | ICD-10-CM

## 2018-03-27 DIAGNOSIS — Z8546 Personal history of malignant neoplasm of prostate: Secondary | ICD-10-CM | POA: Diagnosis not present

## 2018-03-27 DIAGNOSIS — F419 Anxiety disorder, unspecified: Secondary | ICD-10-CM | POA: Diagnosis not present

## 2018-03-27 DIAGNOSIS — Z0000 Encounter for general adult medical examination without abnormal findings: Secondary | ICD-10-CM | POA: Diagnosis not present

## 2018-03-27 DIAGNOSIS — Z1211 Encounter for screening for malignant neoplasm of colon: Secondary | ICD-10-CM | POA: Diagnosis not present

## 2018-03-27 DIAGNOSIS — E119 Type 2 diabetes mellitus without complications: Secondary | ICD-10-CM | POA: Diagnosis not present

## 2018-03-27 DIAGNOSIS — E7800 Pure hypercholesterolemia, unspecified: Secondary | ICD-10-CM | POA: Diagnosis not present

## 2018-03-27 DIAGNOSIS — I10 Essential (primary) hypertension: Secondary | ICD-10-CM | POA: Diagnosis not present

## 2018-03-27 DIAGNOSIS — Z6841 Body Mass Index (BMI) 40.0 and over, adult: Secondary | ICD-10-CM | POA: Diagnosis not present

## 2018-03-27 DIAGNOSIS — E1169 Type 2 diabetes mellitus with other specified complication: Secondary | ICD-10-CM | POA: Diagnosis not present

## 2018-05-17 ENCOUNTER — Ambulatory Visit (INDEPENDENT_AMBULATORY_CARE_PROVIDER_SITE_OTHER): Payer: Medicare HMO | Admitting: *Deleted

## 2018-05-17 DIAGNOSIS — M2142 Flat foot [pes planus] (acquired), left foot: Secondary | ICD-10-CM

## 2018-05-17 DIAGNOSIS — M21619 Bunion of unspecified foot: Secondary | ICD-10-CM

## 2018-05-17 DIAGNOSIS — E1142 Type 2 diabetes mellitus with diabetic polyneuropathy: Secondary | ICD-10-CM

## 2018-05-17 DIAGNOSIS — M2141 Flat foot [pes planus] (acquired), right foot: Secondary | ICD-10-CM | POA: Diagnosis not present

## 2018-05-17 DIAGNOSIS — L84 Corns and callosities: Secondary | ICD-10-CM

## 2018-06-15 DIAGNOSIS — H538 Other visual disturbances: Secondary | ICD-10-CM | POA: Diagnosis not present

## 2018-06-15 DIAGNOSIS — H25013 Cortical age-related cataract, bilateral: Secondary | ICD-10-CM | POA: Diagnosis not present

## 2018-06-15 DIAGNOSIS — H52203 Unspecified astigmatism, bilateral: Secondary | ICD-10-CM | POA: Diagnosis not present

## 2018-06-15 DIAGNOSIS — Z7984 Long term (current) use of oral hypoglycemic drugs: Secondary | ICD-10-CM | POA: Diagnosis not present

## 2018-06-15 DIAGNOSIS — H2513 Age-related nuclear cataract, bilateral: Secondary | ICD-10-CM | POA: Diagnosis not present

## 2018-06-15 DIAGNOSIS — E119 Type 2 diabetes mellitus without complications: Secondary | ICD-10-CM | POA: Diagnosis not present

## 2018-06-15 DIAGNOSIS — H5203 Hypermetropia, bilateral: Secondary | ICD-10-CM | POA: Diagnosis not present

## 2018-06-15 DIAGNOSIS — H524 Presbyopia: Secondary | ICD-10-CM | POA: Diagnosis not present

## 2018-06-15 DIAGNOSIS — H401131 Primary open-angle glaucoma, bilateral, mild stage: Secondary | ICD-10-CM | POA: Diagnosis not present

## 2018-06-28 DIAGNOSIS — E1169 Type 2 diabetes mellitus with other specified complication: Secondary | ICD-10-CM | POA: Diagnosis not present

## 2018-06-28 DIAGNOSIS — Z7984 Long term (current) use of oral hypoglycemic drugs: Secondary | ICD-10-CM | POA: Diagnosis not present

## 2018-06-28 DIAGNOSIS — Z8546 Personal history of malignant neoplasm of prostate: Secondary | ICD-10-CM | POA: Diagnosis not present

## 2018-06-28 DIAGNOSIS — Z6841 Body Mass Index (BMI) 40.0 and over, adult: Secondary | ICD-10-CM | POA: Diagnosis not present

## 2018-06-28 DIAGNOSIS — E119 Type 2 diabetes mellitus without complications: Secondary | ICD-10-CM | POA: Diagnosis not present

## 2018-06-28 DIAGNOSIS — E7800 Pure hypercholesterolemia, unspecified: Secondary | ICD-10-CM | POA: Diagnosis not present

## 2018-06-28 DIAGNOSIS — I10 Essential (primary) hypertension: Secondary | ICD-10-CM | POA: Diagnosis not present

## 2018-06-28 DIAGNOSIS — R001 Bradycardia, unspecified: Secondary | ICD-10-CM | POA: Diagnosis not present

## 2018-06-28 DIAGNOSIS — F419 Anxiety disorder, unspecified: Secondary | ICD-10-CM | POA: Diagnosis not present

## 2018-07-04 DIAGNOSIS — H25013 Cortical age-related cataract, bilateral: Secondary | ICD-10-CM | POA: Diagnosis not present

## 2018-07-04 DIAGNOSIS — H2513 Age-related nuclear cataract, bilateral: Secondary | ICD-10-CM | POA: Diagnosis not present

## 2018-07-11 DIAGNOSIS — H25012 Cortical age-related cataract, left eye: Secondary | ICD-10-CM | POA: Diagnosis not present

## 2018-07-11 DIAGNOSIS — H2512 Age-related nuclear cataract, left eye: Secondary | ICD-10-CM | POA: Diagnosis not present

## 2018-07-11 DIAGNOSIS — H2511 Age-related nuclear cataract, right eye: Secondary | ICD-10-CM | POA: Diagnosis not present

## 2018-07-18 DIAGNOSIS — H25012 Cortical age-related cataract, left eye: Secondary | ICD-10-CM | POA: Diagnosis not present

## 2018-07-18 DIAGNOSIS — H2512 Age-related nuclear cataract, left eye: Secondary | ICD-10-CM | POA: Diagnosis not present

## 2018-08-06 NOTE — Progress Notes (Signed)
Patient ID: Justin Cohen, male   DOB: 06-May-1946, 73 y.o.   MRN: [id number]   Patient presents for diabetic shoe pick up, shoes are tried on for good fit.  Patient received 1 pair OrthoFeet Men - Sprint Tie-Less Blue/Black - 545 in Men's 10.50extra wide and 3 pairs custom molded diabetic inserts.  Verbal and written break in and wear instructions given.  Patient will follow up for scheduled routine care.

## 2018-08-06 NOTE — Patient Instructions (Signed)

## 2018-08-16 DIAGNOSIS — H524 Presbyopia: Secondary | ICD-10-CM | POA: Diagnosis not present

## 2018-08-16 DIAGNOSIS — Z0100 Encounter for examination of eyes and vision without abnormal findings: Secondary | ICD-10-CM | POA: Diagnosis not present

## 2018-09-10 NOTE — Progress Notes (Signed)
Patient ID: Justin Cohen, male   DOB: March 04, 1946, 72 y.o.   MRN: [id number]   Patient presents at Dr Leeanne Rio request to be measured for diabetic shoes and inserts with [facility] Certified Pedorthist.  Patient will be called when shoes and inserts arrive to schedule a fitting.

## 2018-09-21 DIAGNOSIS — G4733 Obstructive sleep apnea (adult) (pediatric): Secondary | ICD-10-CM | POA: Diagnosis not present

## 2018-09-21 DIAGNOSIS — M1611 Unilateral primary osteoarthritis, right hip: Secondary | ICD-10-CM | POA: Diagnosis not present

## 2018-09-21 DIAGNOSIS — M25551 Pain in right hip: Secondary | ICD-10-CM | POA: Diagnosis not present

## 2018-10-19 DIAGNOSIS — L570 Actinic keratosis: Secondary | ICD-10-CM | POA: Diagnosis not present

## 2018-10-19 DIAGNOSIS — Z23 Encounter for immunization: Secondary | ICD-10-CM | POA: Diagnosis not present

## 2018-10-19 DIAGNOSIS — Z08 Encounter for follow-up examination after completed treatment for malignant neoplasm: Secondary | ICD-10-CM | POA: Diagnosis not present

## 2018-10-19 DIAGNOSIS — Z85828 Personal history of other malignant neoplasm of skin: Secondary | ICD-10-CM | POA: Diagnosis not present

## 2018-10-19 DIAGNOSIS — L408 Other psoriasis: Secondary | ICD-10-CM | POA: Diagnosis not present

## 2018-11-15 DIAGNOSIS — J069 Acute upper respiratory infection, unspecified: Secondary | ICD-10-CM | POA: Diagnosis not present

## 2018-11-20 DIAGNOSIS — Z961 Presence of intraocular lens: Secondary | ICD-10-CM | POA: Diagnosis not present

## 2018-11-27 IMAGING — DX DG PORTABLE PELVIS
1 series · 1 of 1 positions shown · non-contrast
Comparison: 11/21/2017.

CLINICAL DATA: Postop right total hip replacement.

EXAM:
PORTABLE PELVIS 1-2 VIEWS

[pelvis ap]
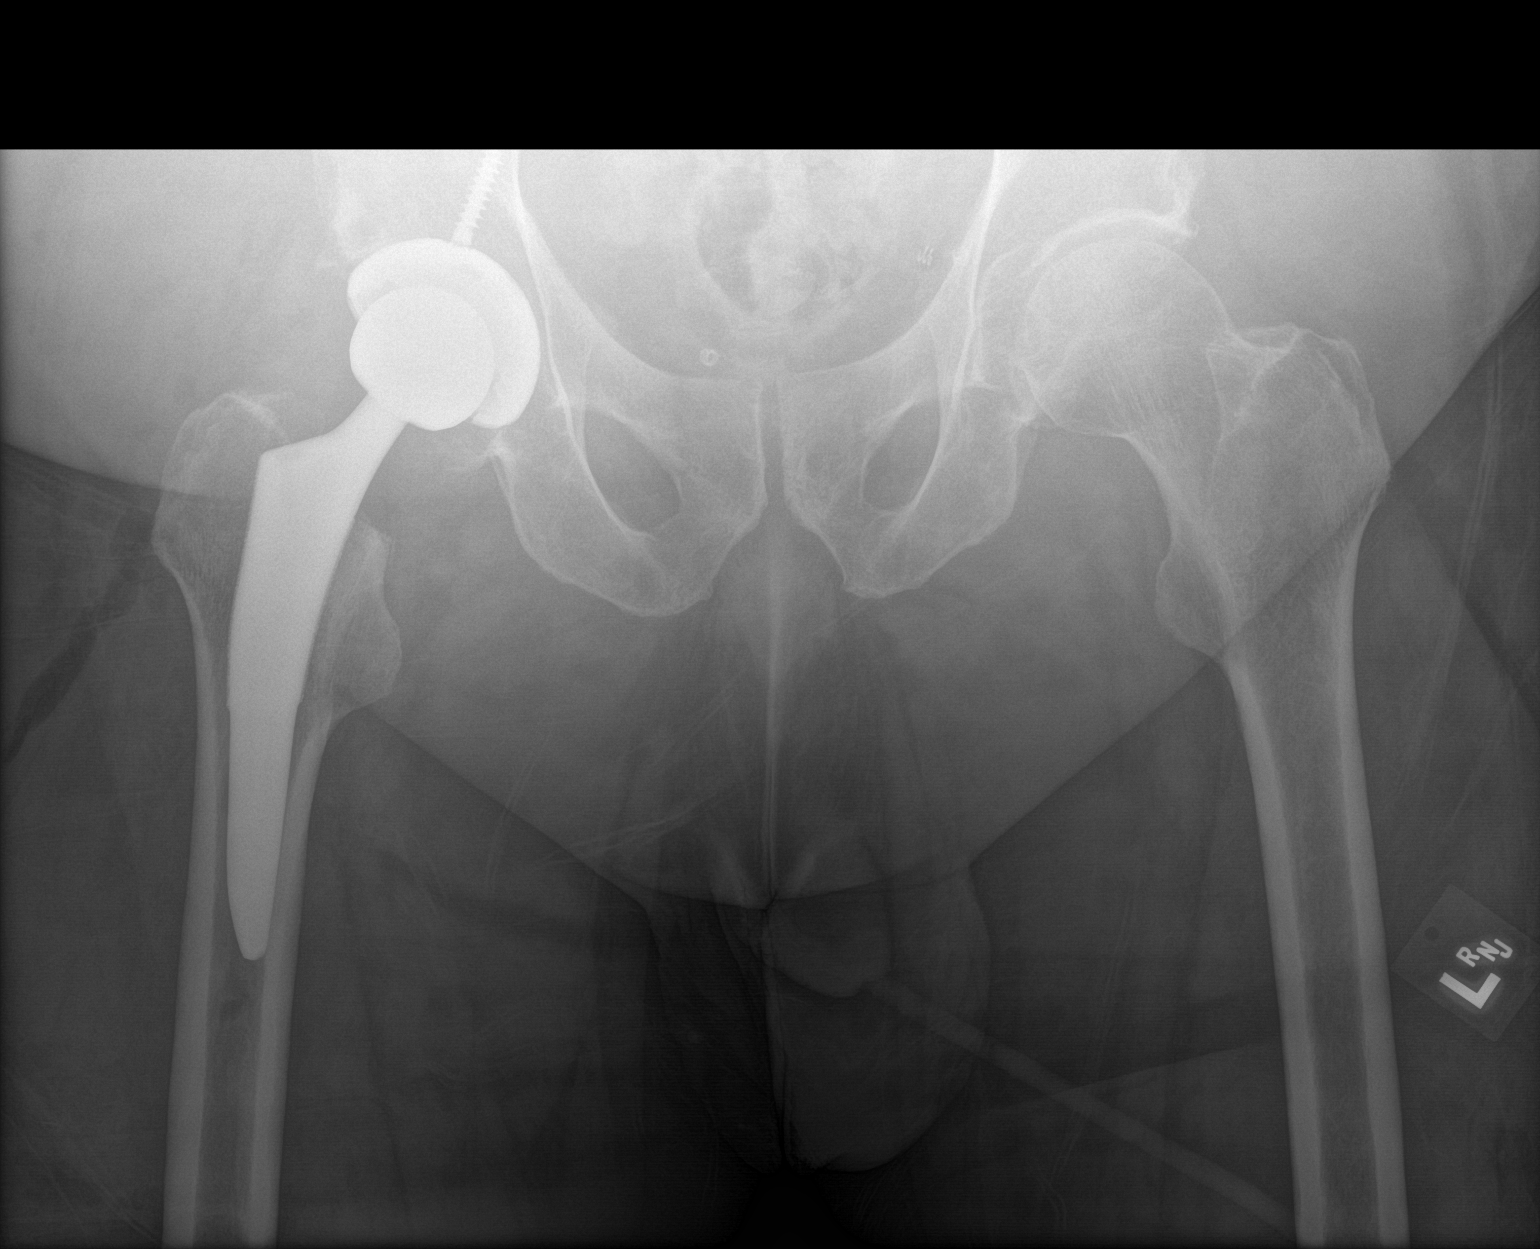

[1 of 1 positions shown; findings below may reference images not displayed]

FINDINGS: frontal AP view of the pelvis obtained at 3993 hours shows right
total hip replacement. No evidence for immediate hardware
complications. Mesh anchors are identified over the lower pelvis.
IMPRESSION: Right total hip replacement without evidence for immediate hardware
complications.

## 2018-11-28 DIAGNOSIS — Z96641 Presence of right artificial hip joint: Secondary | ICD-10-CM | POA: Diagnosis not present

## 2018-11-28 DIAGNOSIS — Z471 Aftercare following joint replacement surgery: Secondary | ICD-10-CM | POA: Diagnosis not present

## 2018-12-19 DIAGNOSIS — R6889 Other general symptoms and signs: Secondary | ICD-10-CM | POA: Diagnosis not present

## 2019-04-02 DIAGNOSIS — Z0000 Encounter for general adult medical examination without abnormal findings: Secondary | ICD-10-CM | POA: Diagnosis not present

## 2019-04-02 DIAGNOSIS — J302 Other seasonal allergic rhinitis: Secondary | ICD-10-CM | POA: Diagnosis not present

## 2019-04-02 DIAGNOSIS — Z8546 Personal history of malignant neoplasm of prostate: Secondary | ICD-10-CM | POA: Diagnosis not present

## 2019-04-02 DIAGNOSIS — E7800 Pure hypercholesterolemia, unspecified: Secondary | ICD-10-CM | POA: Diagnosis not present

## 2019-04-02 DIAGNOSIS — F419 Anxiety disorder, unspecified: Secondary | ICD-10-CM | POA: Diagnosis not present

## 2019-04-02 DIAGNOSIS — Z1211 Encounter for screening for malignant neoplasm of colon: Secondary | ICD-10-CM | POA: Diagnosis not present

## 2019-04-02 DIAGNOSIS — I10 Essential (primary) hypertension: Secondary | ICD-10-CM | POA: Diagnosis not present

## 2019-04-02 DIAGNOSIS — E1169 Type 2 diabetes mellitus with other specified complication: Secondary | ICD-10-CM | POA: Diagnosis not present

## 2019-04-02 DIAGNOSIS — E119 Type 2 diabetes mellitus without complications: Secondary | ICD-10-CM | POA: Diagnosis not present

## 2019-04-10 DIAGNOSIS — Z125 Encounter for screening for malignant neoplasm of prostate: Secondary | ICD-10-CM | POA: Diagnosis not present

## 2019-04-10 DIAGNOSIS — E7800 Pure hypercholesterolemia, unspecified: Secondary | ICD-10-CM | POA: Diagnosis not present

## 2019-04-10 DIAGNOSIS — E1169 Type 2 diabetes mellitus with other specified complication: Secondary | ICD-10-CM | POA: Diagnosis not present

## 2019-04-24 DIAGNOSIS — Z85828 Personal history of other malignant neoplasm of skin: Secondary | ICD-10-CM | POA: Diagnosis not present

## 2019-04-24 DIAGNOSIS — L570 Actinic keratosis: Secondary | ICD-10-CM | POA: Diagnosis not present

## 2019-04-24 DIAGNOSIS — L821 Other seborrheic keratosis: Secondary | ICD-10-CM | POA: Diagnosis not present

## 2019-04-24 DIAGNOSIS — Z08 Encounter for follow-up examination after completed treatment for malignant neoplasm: Secondary | ICD-10-CM | POA: Diagnosis not present

## 2019-04-29 DIAGNOSIS — H401131 Primary open-angle glaucoma, bilateral, mild stage: Secondary | ICD-10-CM | POA: Diagnosis not present

## 2019-05-08 ENCOUNTER — Encounter: Payer: Self-pay | Admitting: Sports Medicine

## 2019-05-08 ENCOUNTER — Ambulatory Visit (INDEPENDENT_AMBULATORY_CARE_PROVIDER_SITE_OTHER): Payer: Medicare HMO | Admitting: Sports Medicine

## 2019-05-08 ENCOUNTER — Other Ambulatory Visit: Payer: Self-pay

## 2019-05-08 VITALS — Temp 96.9°F | Resp 16

## 2019-05-08 DIAGNOSIS — M79675 Pain in left toe(s): Secondary | ICD-10-CM | POA: Diagnosis not present

## 2019-05-08 DIAGNOSIS — E1142 Type 2 diabetes mellitus with diabetic polyneuropathy: Secondary | ICD-10-CM

## 2019-05-08 DIAGNOSIS — B351 Tinea unguium: Secondary | ICD-10-CM

## 2019-05-08 DIAGNOSIS — M79674 Pain in right toe(s): Secondary | ICD-10-CM

## 2019-05-08 DIAGNOSIS — E119 Type 2 diabetes mellitus without complications: Secondary | ICD-10-CM

## 2019-05-08 DIAGNOSIS — L84 Corns and callosities: Secondary | ICD-10-CM

## 2019-05-08 DIAGNOSIS — M2142 Flat foot [pes planus] (acquired), left foot: Secondary | ICD-10-CM

## 2019-05-08 DIAGNOSIS — M21619 Bunion of unspecified foot: Secondary | ICD-10-CM

## 2019-05-08 DIAGNOSIS — M79671 Pain in right foot: Secondary | ICD-10-CM

## 2019-05-08 DIAGNOSIS — M79672 Pain in left foot: Secondary | ICD-10-CM

## 2019-05-08 DIAGNOSIS — M2141 Flat foot [pes planus] (acquired), right foot: Secondary | ICD-10-CM

## 2019-05-08 NOTE — Progress Notes (Signed)
Subjective: Justin Cohen is a 73 y.o. male patient with history of diabetes who presents to office today for diabetic foot care and for new diabetic shoes/inserts since it is time to get a new set from last year.  Patient reports that he has pain around the calluses and does not think that his current insoles are helping anymore and his diabetic shoes reports that his last A1c was 7.5 and that he does not check his blood sugars on a regular basis saw his primary care doctor in May. Patient denies any new issues.  Patient Active Problem List   Diagnosis Date Noted  . S/P right THA, AA 11/21/2017  . 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 Malnutrition of moderate degree ([place]) 04/02/2015  . Abscess   . Hyperlipidemia 03/31/2015  . Pericholecystic abscess 03/31/2015  . Postoperative wound abscess 03/31/2015  . Cholecystitis, acute 03/13/2015  . Sepsis due to Escherichia coli ([place]) 03/11/2015  . Choledocholithiasis 03/08/2015  . Cholangitis 03/06/2015  . HTN (hypertension) 03/06/2015  . DM w/o complication type II ([place]) 03/06/2015  . Encephalopathy due to infection 03/06/2015  . Sleep apnea 03/06/2015  . Morbid obesity ([place]) 02/18/2015  . S/P left TKA 02/17/2015   Current Outpatient Medications on File Prior to Visit  Medication Sig Dispense Refill  . amLODipine-benazepril (LOTREL) 5-10 MG per capsule Take 1 capsule by mouth every morning.     . cetirizine (ZYRTEC) 10 MG tablet Take 10 mg by mouth daily.    . Cholecalciferol (VITAMIN D) 2000 UNITS tablet Take 2,000 Units by mouth daily.    Marland Kitchen docusate sodium (COLACE) 100 MG capsule Take 1 capsule (100 mg total) by mouth 2 (two) times daily. 10 capsule 0  . ferrous sulfate (FERROUSUL) 325 (65 FE) MG tablet Take 1 tablet (325 mg total) by mouth 3 (three) times daily with meals.  3  . fluticasone (FLONASE) 50 MCG/ACT nasal spray Place 2 sprays into both nostrils daily.     Marland Kitchen glipiZIDE (GLUCOTROL) 5 MG tablet Take 5 mg by mouth daily  before breakfast.     . HYDROcodone-acetaminophen (NORCO) 7.5-325 MG tablet Take 1-2 tablets by mouth every 4 (four) hours as needed for moderate pain or severe pain. 60 tablet 0  . lansoprazole (PREVACID) 15 MG capsule Take 15 mg by mouth every morning.     . latanoprost (XALATAN) 0.005 % ophthalmic solution Place 1 drop into both eyes at bedtime.     . metFORMIN (GLUCOPHAGE) 1000 MG tablet Take 1,000 mg by mouth 2 (two) times daily with a meal.    . methocarbamol (ROBAXIN) 500 MG tablet Take 1 tablet (500 mg total) by mouth every 6 (six) hours as needed for muscle spasms. 40 tablet 0  . Multiple Vitamin (MULTIVITAMIN WITH MINERALS) TABS tablet Take 1 tablet by mouth daily.     . Omega-3 Fatty Acids (FISH OIL) 1000 MG CAPS Take 1,000 mg by mouth daily.     . Omega-3 Fatty Acids (FISH OIL) 1000 MG CAPS Take 1,000 mg by mouth daily.    . pioglitazone (ACTOS) 45 MG tablet Take 45 mg by mouth daily.     . polyethylene glycol (MIRALAX / GLYCOLAX) packet Take 17 g by mouth 2 (two) times daily. 14 each 0  . Probiotic Product (PROBIOTIC DAILY PO) Take 1 capsule by mouth daily.    . rosuvastatin (CRESTOR) 20 MG tablet Take 5 mg by mouth every evening.     . venlafaxine XR (EFFEXOR-XR) 75 MG 24  hr capsule Take 75 mg by mouth daily with breakfast.      No current facility-administered medications on file prior to visit.    No Known Allergies  No results found for this or any previous visit (from the past 2160 hour(s)).  Objective: General: Patient is awake, alert, and oriented x 3 and in no acute distress.  Integument: Skin is warm, dry and supple bilateral. Nails are long, thickened and dystrophic with subungual debris, consistent with onychomycosis, 1-5 bilateral. No signs of infection. No open lesions or preulcerative lesions present bilateral. Mild reactive callus at 1st toes and sub-met 1 on right. Remaining integument unremarkable.  Vasculature:  Dorsalis Pedis pulse 2/4 bilateral. Posterior  Tibial pulse 1 /4 bilateral.  Capillary fill time <3 sec 1-5 bilateral. Positive hair growth to the level of the digits. Temperature gradient within normal limits. No varicosities present bilateral. No edema present bilateral.   Neurology: The patient has intact sensation measured with a 5.07/10g Semmes Weinstein Monofilament at all pedal sites bilateral . Vibratory sensation diminished bilateral with tuning fork. No Babinski sign present bilateral.   Musculoskeletal: Asymptomatic bunion L>R and pes planus pedal deformities noted bilateral. Muscular strength 5/5 in all lower extremity muscular groups bilateral without pain on range of motion . No tenderness with calf compression bilateral.  Assessment and Plan: Problem List Items Addressed This Visit    None    Visit Diagnoses    Encounter for diabetic foot exam ([place])    -  Primary   Diabetic polyneuropathy associated with type 2 diabetes mellitus (HCC)       Bunion       Pes planus of both feet       Callus of foot       Foot pain, bilateral       Pain due to onychomycosis of toenails of both feet          -Examined patient. -Discussed and educated patient on diabetic foot care, especially with  regards to the vascular, neurological and musculoskeletal systems.  -Mechanically debrided all nails using a sterile nail nipper and smooth callused areas using a rotary bur without incident -Dispensed tube foam for first toes bilateral to use meanwhile until he is fitted for new shoes and insoles with offloading at problem areas of first toe and first MPJ on right -Safe step diabetic shoe order form was completed; office to contact primary care for approval / certification;  Office to arrange shoe fitting and dispensing. -Answered all patient questions -Patient to return for diabetic shoes and inserts -Patient advised to call the office if any problems or questions arise in the meantime.  Landis Martins, DPM

## 2019-05-20 DIAGNOSIS — H60502 Unspecified acute noninfective otitis externa, left ear: Secondary | ICD-10-CM | POA: Diagnosis not present

## 2019-05-20 DIAGNOSIS — M25552 Pain in left hip: Secondary | ICD-10-CM | POA: Diagnosis not present

## 2019-05-20 DIAGNOSIS — K130 Diseases of lips: Secondary | ICD-10-CM | POA: Diagnosis not present

## 2019-05-22 ENCOUNTER — Ambulatory Visit: Payer: Medicare HMO | Admitting: Orthotics

## 2019-05-22 ENCOUNTER — Other Ambulatory Visit: Payer: Self-pay

## 2019-05-22 DIAGNOSIS — E119 Type 2 diabetes mellitus without complications: Secondary | ICD-10-CM

## 2019-05-22 DIAGNOSIS — E1142 Type 2 diabetes mellitus with diabetic polyneuropathy: Secondary | ICD-10-CM

## 2019-05-23 NOTE — Progress Notes (Signed)

## 2019-07-31 ENCOUNTER — Other Ambulatory Visit: Payer: Self-pay

## 2019-07-31 ENCOUNTER — Ambulatory Visit (INDEPENDENT_AMBULATORY_CARE_PROVIDER_SITE_OTHER): Payer: Medicare HMO | Admitting: Sports Medicine

## 2019-07-31 DIAGNOSIS — M2142 Flat foot [pes planus] (acquired), left foot: Secondary | ICD-10-CM

## 2019-07-31 DIAGNOSIS — M2141 Flat foot [pes planus] (acquired), right foot: Secondary | ICD-10-CM | POA: Diagnosis not present

## 2019-07-31 DIAGNOSIS — E1140 Type 2 diabetes mellitus with diabetic neuropathy, unspecified: Secondary | ICD-10-CM | POA: Diagnosis not present

## 2019-07-31 DIAGNOSIS — L84 Corns and callosities: Secondary | ICD-10-CM | POA: Diagnosis not present

## 2019-07-31 NOTE — Patient Instructions (Signed)

## 2019-08-02 ENCOUNTER — Other Ambulatory Visit: Payer: Self-pay

## 2019-08-02 DIAGNOSIS — R6889 Other general symptoms and signs: Secondary | ICD-10-CM | POA: Diagnosis not present

## 2019-08-02 DIAGNOSIS — Z20822 Contact with and (suspected) exposure to covid-19: Secondary | ICD-10-CM

## 2019-08-03 LAB — NOVEL CORONAVIRUS, NAA: SARS-CoV-2, NAA: DETECTED — AB

## 2019-08-29 DIAGNOSIS — H401131 Primary open-angle glaucoma, bilateral, mild stage: Secondary | ICD-10-CM | POA: Diagnosis not present

## 2019-09-24 DIAGNOSIS — E1169 Type 2 diabetes mellitus with other specified complication: Secondary | ICD-10-CM | POA: Diagnosis not present

## 2019-09-24 DIAGNOSIS — Z23 Encounter for immunization: Secondary | ICD-10-CM | POA: Diagnosis not present

## 2019-09-24 DIAGNOSIS — E119 Type 2 diabetes mellitus without complications: Secondary | ICD-10-CM | POA: Diagnosis not present

## 2019-09-24 DIAGNOSIS — I10 Essential (primary) hypertension: Secondary | ICD-10-CM | POA: Diagnosis not present

## 2019-09-24 DIAGNOSIS — Z6841 Body Mass Index (BMI) 40.0 and over, adult: Secondary | ICD-10-CM | POA: Diagnosis not present

## 2019-09-24 DIAGNOSIS — F419 Anxiety disorder, unspecified: Secondary | ICD-10-CM | POA: Diagnosis not present

## 2019-09-24 DIAGNOSIS — Z8546 Personal history of malignant neoplasm of prostate: Secondary | ICD-10-CM | POA: Diagnosis not present

## 2019-09-24 DIAGNOSIS — J302 Other seasonal allergic rhinitis: Secondary | ICD-10-CM | POA: Diagnosis not present

## 2019-09-24 DIAGNOSIS — K130 Diseases of lips: Secondary | ICD-10-CM | POA: Diagnosis not present

## 2019-09-24 DIAGNOSIS — E7800 Pure hypercholesterolemia, unspecified: Secondary | ICD-10-CM | POA: Diagnosis not present

## 2019-11-13 DIAGNOSIS — L2089 Other atopic dermatitis: Secondary | ICD-10-CM | POA: Diagnosis not present

## 2019-11-13 DIAGNOSIS — D485 Neoplasm of uncertain behavior of skin: Secondary | ICD-10-CM | POA: Diagnosis not present

## 2019-11-13 DIAGNOSIS — L570 Actinic keratosis: Secondary | ICD-10-CM | POA: Diagnosis not present

## 2019-11-13 DIAGNOSIS — Z85828 Personal history of other malignant neoplasm of skin: Secondary | ICD-10-CM | POA: Diagnosis not present

## 2019-11-13 DIAGNOSIS — L821 Other seborrheic keratosis: Secondary | ICD-10-CM | POA: Diagnosis not present

## 2019-11-28 DIAGNOSIS — I10 Essential (primary) hypertension: Secondary | ICD-10-CM | POA: Diagnosis not present

## 2019-11-28 DIAGNOSIS — C61 Malignant neoplasm of prostate: Secondary | ICD-10-CM | POA: Diagnosis not present

## 2019-11-28 DIAGNOSIS — E1169 Type 2 diabetes mellitus with other specified complication: Secondary | ICD-10-CM | POA: Diagnosis not present

## 2019-11-28 DIAGNOSIS — E782 Mixed hyperlipidemia: Secondary | ICD-10-CM | POA: Diagnosis not present

## 2019-11-28 DIAGNOSIS — M179 Osteoarthritis of knee, unspecified: Secondary | ICD-10-CM | POA: Diagnosis not present

## 2019-11-28 DIAGNOSIS — M19041 Primary osteoarthritis, right hand: Secondary | ICD-10-CM | POA: Diagnosis not present

## 2019-11-28 DIAGNOSIS — Z8546 Personal history of malignant neoplasm of prostate: Secondary | ICD-10-CM | POA: Diagnosis not present

## 2020-01-28 DIAGNOSIS — E782 Mixed hyperlipidemia: Secondary | ICD-10-CM | POA: Diagnosis not present

## 2020-01-28 DIAGNOSIS — C61 Malignant neoplasm of prostate: Secondary | ICD-10-CM | POA: Diagnosis not present

## 2020-01-28 DIAGNOSIS — E785 Hyperlipidemia, unspecified: Secondary | ICD-10-CM | POA: Diagnosis not present

## 2020-01-28 DIAGNOSIS — E7800 Pure hypercholesterolemia, unspecified: Secondary | ICD-10-CM | POA: Diagnosis not present

## 2020-01-28 DIAGNOSIS — G4700 Insomnia, unspecified: Secondary | ICD-10-CM | POA: Diagnosis not present

## 2020-01-28 DIAGNOSIS — M179 Osteoarthritis of knee, unspecified: Secondary | ICD-10-CM | POA: Diagnosis not present

## 2020-01-28 DIAGNOSIS — Z8546 Personal history of malignant neoplasm of prostate: Secondary | ICD-10-CM | POA: Diagnosis not present

## 2020-01-28 DIAGNOSIS — I10 Essential (primary) hypertension: Secondary | ICD-10-CM | POA: Diagnosis not present

## 2020-01-28 DIAGNOSIS — M19041 Primary osteoarthritis, right hand: Secondary | ICD-10-CM | POA: Diagnosis not present

## 2020-02-05 DIAGNOSIS — I10 Essential (primary) hypertension: Secondary | ICD-10-CM | POA: Diagnosis not present

## 2020-02-19 DIAGNOSIS — Z8546 Personal history of malignant neoplasm of prostate: Secondary | ICD-10-CM | POA: Diagnosis not present

## 2020-02-19 DIAGNOSIS — G4700 Insomnia, unspecified: Secondary | ICD-10-CM | POA: Diagnosis not present

## 2020-02-19 DIAGNOSIS — C61 Malignant neoplasm of prostate: Secondary | ICD-10-CM | POA: Diagnosis not present

## 2020-02-19 DIAGNOSIS — E785 Hyperlipidemia, unspecified: Secondary | ICD-10-CM | POA: Diagnosis not present

## 2020-02-19 DIAGNOSIS — M179 Osteoarthritis of knee, unspecified: Secondary | ICD-10-CM | POA: Diagnosis not present

## 2020-02-19 DIAGNOSIS — E782 Mixed hyperlipidemia: Secondary | ICD-10-CM | POA: Diagnosis not present

## 2020-02-19 DIAGNOSIS — I10 Essential (primary) hypertension: Secondary | ICD-10-CM | POA: Diagnosis not present

## 2020-02-19 DIAGNOSIS — M19041 Primary osteoarthritis, right hand: Secondary | ICD-10-CM | POA: Diagnosis not present

## 2020-02-19 DIAGNOSIS — E7800 Pure hypercholesterolemia, unspecified: Secondary | ICD-10-CM | POA: Diagnosis not present

## 2020-02-27 DIAGNOSIS — H524 Presbyopia: Secondary | ICD-10-CM | POA: Diagnosis not present

## 2020-02-27 DIAGNOSIS — Z961 Presence of intraocular lens: Secondary | ICD-10-CM | POA: Diagnosis not present

## 2020-02-27 DIAGNOSIS — H401131 Primary open-angle glaucoma, bilateral, mild stage: Secondary | ICD-10-CM | POA: Diagnosis not present

## 2020-02-27 DIAGNOSIS — E119 Type 2 diabetes mellitus without complications: Secondary | ICD-10-CM | POA: Diagnosis not present

## 2020-03-06 DIAGNOSIS — I10 Essential (primary) hypertension: Secondary | ICD-10-CM | POA: Diagnosis not present

## 2020-04-02 DIAGNOSIS — Z8546 Personal history of malignant neoplasm of prostate: Secondary | ICD-10-CM | POA: Diagnosis not present

## 2020-04-02 DIAGNOSIS — F419 Anxiety disorder, unspecified: Secondary | ICD-10-CM | POA: Diagnosis not present

## 2020-04-02 DIAGNOSIS — Z0000 Encounter for general adult medical examination without abnormal findings: Secondary | ICD-10-CM | POA: Diagnosis not present

## 2020-04-02 DIAGNOSIS — E7800 Pure hypercholesterolemia, unspecified: Secondary | ICD-10-CM | POA: Diagnosis not present

## 2020-04-02 DIAGNOSIS — J302 Other seasonal allergic rhinitis: Secondary | ICD-10-CM | POA: Diagnosis not present

## 2020-04-02 DIAGNOSIS — I10 Essential (primary) hypertension: Secondary | ICD-10-CM | POA: Diagnosis not present

## 2020-04-02 DIAGNOSIS — E1169 Type 2 diabetes mellitus with other specified complication: Secondary | ICD-10-CM | POA: Diagnosis not present

## 2020-04-03 DIAGNOSIS — I10 Essential (primary) hypertension: Secondary | ICD-10-CM | POA: Diagnosis not present

## 2020-04-03 DIAGNOSIS — M179 Osteoarthritis of knee, unspecified: Secondary | ICD-10-CM | POA: Diagnosis not present

## 2020-04-03 DIAGNOSIS — E785 Hyperlipidemia, unspecified: Secondary | ICD-10-CM | POA: Diagnosis not present

## 2020-04-03 DIAGNOSIS — Z8546 Personal history of malignant neoplasm of prostate: Secondary | ICD-10-CM | POA: Diagnosis not present

## 2020-04-03 DIAGNOSIS — E782 Mixed hyperlipidemia: Secondary | ICD-10-CM | POA: Diagnosis not present

## 2020-04-03 DIAGNOSIS — G4700 Insomnia, unspecified: Secondary | ICD-10-CM | POA: Diagnosis not present

## 2020-04-03 DIAGNOSIS — M19041 Primary osteoarthritis, right hand: Secondary | ICD-10-CM | POA: Diagnosis not present

## 2020-04-03 DIAGNOSIS — E7800 Pure hypercholesterolemia, unspecified: Secondary | ICD-10-CM | POA: Diagnosis not present

## 2020-04-03 DIAGNOSIS — C61 Malignant neoplasm of prostate: Secondary | ICD-10-CM | POA: Diagnosis not present

## 2020-05-05 DIAGNOSIS — G4700 Insomnia, unspecified: Secondary | ICD-10-CM | POA: Diagnosis not present

## 2020-05-05 DIAGNOSIS — M179 Osteoarthritis of knee, unspecified: Secondary | ICD-10-CM | POA: Diagnosis not present

## 2020-05-05 DIAGNOSIS — I10 Essential (primary) hypertension: Secondary | ICD-10-CM | POA: Diagnosis not present

## 2020-05-05 DIAGNOSIS — M19041 Primary osteoarthritis, right hand: Secondary | ICD-10-CM | POA: Diagnosis not present

## 2020-05-05 DIAGNOSIS — Z8546 Personal history of malignant neoplasm of prostate: Secondary | ICD-10-CM | POA: Diagnosis not present

## 2020-05-05 DIAGNOSIS — E782 Mixed hyperlipidemia: Secondary | ICD-10-CM | POA: Diagnosis not present

## 2020-05-05 DIAGNOSIS — C61 Malignant neoplasm of prostate: Secondary | ICD-10-CM | POA: Diagnosis not present

## 2020-05-05 DIAGNOSIS — E7800 Pure hypercholesterolemia, unspecified: Secondary | ICD-10-CM | POA: Diagnosis not present

## 2020-05-05 DIAGNOSIS — E785 Hyperlipidemia, unspecified: Secondary | ICD-10-CM | POA: Diagnosis not present

## 2020-05-18 DIAGNOSIS — L578 Other skin changes due to chronic exposure to nonionizing radiation: Secondary | ICD-10-CM | POA: Diagnosis not present

## 2020-05-18 DIAGNOSIS — L821 Other seborrheic keratosis: Secondary | ICD-10-CM | POA: Diagnosis not present

## 2020-05-18 DIAGNOSIS — Z85828 Personal history of other malignant neoplasm of skin: Secondary | ICD-10-CM | POA: Diagnosis not present

## 2020-05-18 DIAGNOSIS — L570 Actinic keratosis: Secondary | ICD-10-CM | POA: Diagnosis not present

## 2020-05-18 DIAGNOSIS — L408 Other psoriasis: Secondary | ICD-10-CM | POA: Diagnosis not present

## 2020-07-03 DIAGNOSIS — E7800 Pure hypercholesterolemia, unspecified: Secondary | ICD-10-CM | POA: Diagnosis not present

## 2020-08-03 DIAGNOSIS — E1169 Type 2 diabetes mellitus with other specified complication: Secondary | ICD-10-CM | POA: Diagnosis not present

## 2020-08-25 DIAGNOSIS — E785 Hyperlipidemia, unspecified: Secondary | ICD-10-CM | POA: Diagnosis not present

## 2020-08-25 DIAGNOSIS — I10 Essential (primary) hypertension: Secondary | ICD-10-CM | POA: Diagnosis not present

## 2020-08-25 DIAGNOSIS — C61 Malignant neoplasm of prostate: Secondary | ICD-10-CM | POA: Diagnosis not present

## 2020-08-25 DIAGNOSIS — E782 Mixed hyperlipidemia: Secondary | ICD-10-CM | POA: Diagnosis not present

## 2020-08-25 DIAGNOSIS — E7800 Pure hypercholesterolemia, unspecified: Secondary | ICD-10-CM | POA: Diagnosis not present

## 2020-08-25 DIAGNOSIS — Z8546 Personal history of malignant neoplasm of prostate: Secondary | ICD-10-CM | POA: Diagnosis not present

## 2020-08-25 DIAGNOSIS — M19041 Primary osteoarthritis, right hand: Secondary | ICD-10-CM | POA: Diagnosis not present

## 2020-08-25 DIAGNOSIS — M179 Osteoarthritis of knee, unspecified: Secondary | ICD-10-CM | POA: Diagnosis not present

## 2020-08-25 DIAGNOSIS — G4700 Insomnia, unspecified: Secondary | ICD-10-CM | POA: Diagnosis not present

## 2020-09-01 DIAGNOSIS — H401131 Primary open-angle glaucoma, bilateral, mild stage: Secondary | ICD-10-CM | POA: Diagnosis not present

## 2020-10-16 DIAGNOSIS — M25551 Pain in right hip: Secondary | ICD-10-CM | POA: Diagnosis not present

## 2020-10-16 DIAGNOSIS — M1611 Unilateral primary osteoarthritis, right hip: Secondary | ICD-10-CM | POA: Diagnosis not present

## 2020-10-16 DIAGNOSIS — G4733 Obstructive sleep apnea (adult) (pediatric): Secondary | ICD-10-CM | POA: Diagnosis not present

## 2020-11-10 DIAGNOSIS — Z7984 Long term (current) use of oral hypoglycemic drugs: Secondary | ICD-10-CM | POA: Diagnosis not present

## 2020-11-10 DIAGNOSIS — I10 Essential (primary) hypertension: Secondary | ICD-10-CM | POA: Diagnosis not present

## 2020-11-10 DIAGNOSIS — Z8546 Personal history of malignant neoplasm of prostate: Secondary | ICD-10-CM | POA: Diagnosis not present

## 2020-11-10 DIAGNOSIS — E1169 Type 2 diabetes mellitus with other specified complication: Secondary | ICD-10-CM | POA: Diagnosis not present

## 2020-11-10 DIAGNOSIS — E7800 Pure hypercholesterolemia, unspecified: Secondary | ICD-10-CM | POA: Diagnosis not present

## 2020-11-10 DIAGNOSIS — F419 Anxiety disorder, unspecified: Secondary | ICD-10-CM | POA: Diagnosis not present

## 2020-11-10 DIAGNOSIS — J302 Other seasonal allergic rhinitis: Secondary | ICD-10-CM | POA: Diagnosis not present

## 2020-12-31 DIAGNOSIS — E1169 Type 2 diabetes mellitus with other specified complication: Secondary | ICD-10-CM | POA: Diagnosis not present

## 2020-12-31 DIAGNOSIS — I10 Essential (primary) hypertension: Secondary | ICD-10-CM | POA: Diagnosis not present

## 2020-12-31 DIAGNOSIS — Z8546 Personal history of malignant neoplasm of prostate: Secondary | ICD-10-CM | POA: Diagnosis not present

## 2020-12-31 DIAGNOSIS — E782 Mixed hyperlipidemia: Secondary | ICD-10-CM | POA: Diagnosis not present

## 2020-12-31 DIAGNOSIS — M179 Osteoarthritis of knee, unspecified: Secondary | ICD-10-CM | POA: Diagnosis not present

## 2020-12-31 DIAGNOSIS — G4700 Insomnia, unspecified: Secondary | ICD-10-CM | POA: Diagnosis not present

## 2020-12-31 DIAGNOSIS — E7800 Pure hypercholesterolemia, unspecified: Secondary | ICD-10-CM | POA: Diagnosis not present

## 2020-12-31 DIAGNOSIS — M19041 Primary osteoarthritis, right hand: Secondary | ICD-10-CM | POA: Diagnosis not present

## 2020-12-31 DIAGNOSIS — C61 Malignant neoplasm of prostate: Secondary | ICD-10-CM | POA: Diagnosis not present

## 2021-01-27 DIAGNOSIS — G4733 Obstructive sleep apnea (adult) (pediatric): Secondary | ICD-10-CM | POA: Diagnosis not present

## 2021-02-07 ENCOUNTER — Inpatient Hospital Stay (HOSPITAL_COMMUNITY)
Admission: EM | Admit: 2021-02-07 | Discharge: 2021-02-09 | DRG: 247 | Disposition: A | Discharge status: Home / Self Care | Payer: Medicare HMO | Attending: Internal Medicine | Admitting: Internal Medicine

## 2021-02-07 ENCOUNTER — Inpatient Hospital Stay (HOSPITAL_COMMUNITY): Payer: Medicare HMO

## 2021-02-07 ENCOUNTER — Emergency Department (HOSPITAL_COMMUNITY): Payer: Medicare HMO

## 2021-02-07 ENCOUNTER — Other Ambulatory Visit: Payer: Self-pay

## 2021-02-07 DIAGNOSIS — I25119 Atherosclerotic heart disease of native coronary artery with unspecified angina pectoris: Secondary | ICD-10-CM | POA: Diagnosis not present

## 2021-02-07 DIAGNOSIS — Z28310 Unvaccinated for covid-19: Secondary | ICD-10-CM

## 2021-02-07 DIAGNOSIS — E782 Mixed hyperlipidemia: Secondary | ICD-10-CM | POA: Diagnosis not present

## 2021-02-07 DIAGNOSIS — Z96653 Presence of artificial knee joint, bilateral: Secondary | ICD-10-CM | POA: Diagnosis present

## 2021-02-07 DIAGNOSIS — Z96641 Presence of right artificial hip joint: Secondary | ICD-10-CM | POA: Diagnosis present

## 2021-02-07 DIAGNOSIS — M1990 Unspecified osteoarthritis, unspecified site: Secondary | ICD-10-CM | POA: Diagnosis present

## 2021-02-07 DIAGNOSIS — I10 Essential (primary) hypertension: Secondary | ICD-10-CM | POA: Diagnosis present

## 2021-02-07 DIAGNOSIS — Z955 Presence of coronary angioplasty implant and graft: Secondary | ICD-10-CM

## 2021-02-07 DIAGNOSIS — R079 Chest pain, unspecified: Secondary | ICD-10-CM | POA: Diagnosis not present

## 2021-02-07 DIAGNOSIS — H409 Unspecified glaucoma: Secondary | ICD-10-CM | POA: Diagnosis present

## 2021-02-07 DIAGNOSIS — E119 Type 2 diabetes mellitus without complications: Secondary | ICD-10-CM | POA: Diagnosis not present

## 2021-02-07 DIAGNOSIS — K219 Gastro-esophageal reflux disease without esophagitis: Secondary | ICD-10-CM | POA: Diagnosis not present

## 2021-02-07 DIAGNOSIS — Z87891 Personal history of nicotine dependence: Secondary | ICD-10-CM | POA: Diagnosis not present

## 2021-02-07 DIAGNOSIS — Z8249 Family history of ischemic heart disease and other diseases of the circulatory system: Secondary | ICD-10-CM

## 2021-02-07 DIAGNOSIS — Z6841 Body Mass Index (BMI) 40.0 and over, adult: Secondary | ICD-10-CM

## 2021-02-07 DIAGNOSIS — G4730 Sleep apnea, unspecified: Secondary | ICD-10-CM | POA: Diagnosis present

## 2021-02-07 DIAGNOSIS — I5021 Acute systolic (congestive) heart failure: Secondary | ICD-10-CM | POA: Diagnosis not present

## 2021-02-07 DIAGNOSIS — R0789 Other chest pain: Secondary | ICD-10-CM | POA: Diagnosis present

## 2021-02-07 DIAGNOSIS — Z8546 Personal history of malignant neoplasm of prostate: Secondary | ICD-10-CM

## 2021-02-07 DIAGNOSIS — Z85820 Personal history of malignant melanoma of skin: Secondary | ICD-10-CM

## 2021-02-07 DIAGNOSIS — Z79899 Other long term (current) drug therapy: Secondary | ICD-10-CM | POA: Diagnosis not present

## 2021-02-07 DIAGNOSIS — Z7984 Long term (current) use of oral hypoglycemic drugs: Secondary | ICD-10-CM | POA: Diagnosis not present

## 2021-02-07 DIAGNOSIS — E785 Hyperlipidemia, unspecified: Secondary | ICD-10-CM | POA: Diagnosis not present

## 2021-02-07 DIAGNOSIS — I200 Unstable angina: Secondary | ICD-10-CM

## 2021-02-07 DIAGNOSIS — Z20822 Contact with and (suspected) exposure to covid-19: Secondary | ICD-10-CM | POA: Diagnosis present

## 2021-02-07 DIAGNOSIS — I25110 Atherosclerotic heart disease of native coronary artery with unstable angina pectoris: Principal | ICD-10-CM | POA: Diagnosis present

## 2021-02-07 LAB — TROPONIN I (HIGH SENSITIVITY)
Troponin I (High Sensitivity): 61 ng/L — ABNORMAL HIGH (ref <18)
Troponin I (High Sensitivity): 71 ng/L — ABNORMAL HIGH (ref <18)
Troponin I (High Sensitivity): 88 ng/L — ABNORMAL HIGH (ref <18)
Troponin I (High Sensitivity): 81 ng/L — ABNORMAL HIGH (ref <18)

## 2021-02-07 LAB — BRAIN NATRIURETIC PEPTIDE: B Natriuretic Peptide: 35.1 pg/mL (ref 0.0–100.0)

## 2021-02-07 LAB — PROTIME-INR
INR: 1.1 (ref 0.8–1.2)
Prothrombin Time: 13.7 seconds (ref 11.4–15.2)

## 2021-02-07 LAB — CBC
HCT: 47.2 % (ref 39.0–52.0)
Hemoglobin: 15.5 g/dL (ref 13.0–17.0)
MCH: 28.5 pg (ref 26.0–34.0)
MCHC: 32.8 g/dL (ref 30.0–36.0)
MCV: 86.9 fL (ref 80.0–100.0)
Platelets: 419 10*3/uL — ABNORMAL HIGH (ref 150–400)
RBC: 5.43 MIL/uL (ref 4.22–5.81)
RDW: 15.7 % — ABNORMAL HIGH (ref 11.5–15.5)
WBC: 6.7 10*3/uL (ref 4.0–10.5)
nRBC: 0 % (ref 0.0–0.2)

## 2021-02-07 LAB — ECHOCARDIOGRAM COMPLETE
Area-P 1/2: 2.83 cm2
Height: 69 in
S' Lateral: 2.9 cm
Weight: 4497.38 oz

## 2021-02-07 LAB — BASIC METABOLIC PANEL
Anion gap: 6 (ref 5–15)
BUN: 28 mg/dL — ABNORMAL HIGH (ref 8–23)
CO2: 27 mmol/L (ref 22–32)
Calcium: 9.7 mg/dL (ref 8.9–10.3)
Chloride: 105 mmol/L (ref 98–111)
Creatinine, Ser: 1 mg/dL (ref 0.61–1.24)
GFR, Estimated: >60 mL/min (ref >60)
Glucose, Bld: 165 mg/dL — ABNORMAL HIGH (ref 70–99)
Potassium: 4.9 mmol/L (ref 3.5–5.1)
Sodium: 138 mmol/L (ref 135–145)

## 2021-02-07 LAB — GLUCOSE, CAPILLARY
Glucose-Capillary: 177 mg/dL — ABNORMAL HIGH (ref 70–99)
Glucose-Capillary: 175 mg/dL — ABNORMAL HIGH (ref 70–99)

## 2021-02-07 LAB — RESP PANEL BY RT-PCR (FLU A&B, COVID) ARPGX2
Influenza A by PCR: NEGATIVE
Influenza B by PCR: NEGATIVE
SARS Coronavirus 2 by RT PCR: NEGATIVE

## 2021-02-07 LAB — LIPASE, BLOOD: Lipase: 29 U/L (ref 11–51)

## 2021-02-07 LAB — APTT: aPTT: 32 seconds (ref 24–36)

## 2021-02-07 MED ORDER — AMLODIPINE BESY-BENAZEPRIL HCL 10-20 MG PO CAPS: 1.0000 | ORAL_CAPSULE | Freq: Every day | ORAL | Status: DC

## 2021-02-07 MED ORDER — PANTOPRAZOLE SODIUM 40 MG PO TBEC
40.0000 mg | DELAYED_RELEASE_TABLET | Freq: Every day | ORAL | Status: DC
  Administered 2021-02-07 – 2021-02-09 (×3): 40 mg via ORAL
  Filled 2021-02-07 (×3): qty 1

## 2021-02-07 MED ORDER — SIMETHICONE 80 MG PO CHEW
80.0000 mg | CHEWABLE_TABLET | Freq: Four times a day (QID) | ORAL | Status: DC
  Administered 2021-02-07 – 2021-02-09 (×6): 80 mg via ORAL
  Filled 2021-02-07 (×9): qty 1

## 2021-02-07 MED ORDER — ACETAMINOPHEN 325 MG PO TABS: 650.0000 mg | ORAL_TABLET | ORAL | Status: DC | PRN

## 2021-02-07 MED ORDER — NITROGLYCERIN 0.4 MG SL SUBL: 0.4000 mg | SUBLINGUAL_TABLET | SUBLINGUAL | Status: DC | PRN

## 2021-02-07 MED ORDER — VITAMIN D 25 MCG (1000 UNIT) PO TABS
2000.0000 [IU] | ORAL_TABLET | Freq: Every day | ORAL | Status: DC
  Administered 2021-02-08 – 2021-02-09 (×2): 2000 [IU] via ORAL
  Filled 2021-02-07 (×2): qty 2

## 2021-02-07 MED ORDER — SODIUM CHLORIDE 0.9% FLUSH: 3.0000 mL | INTRAVENOUS | Status: DC | PRN

## 2021-02-07 MED ORDER — INSULIN ASPART 100 UNIT/ML ~~LOC~~ SOLN
0.0000 [IU] | Freq: Three times a day (TID) | SUBCUTANEOUS | Status: DC
  Administered 2021-02-08: 3 [IU] via SUBCUTANEOUS
  Administered 2021-02-08: 2 [IU] via SUBCUTANEOUS

## 2021-02-07 MED ORDER — ROSUVASTATIN CALCIUM 5 MG PO TABS
5.0000 mg | ORAL_TABLET | Freq: Every evening | ORAL | Status: DC
  Administered 2021-02-07 – 2021-02-08 (×2): 5 mg via ORAL
  Filled 2021-02-07 (×2): qty 1

## 2021-02-07 MED ORDER — ENOXAPARIN SODIUM 40 MG/0.4ML ~~LOC~~ SOLN: 40.0000 mg | SUBCUTANEOUS | Status: DC

## 2021-02-07 MED ORDER — VENLAFAXINE HCL ER 75 MG PO CP24
75.0000 mg | ORAL_CAPSULE | Freq: Every day | ORAL | Status: DC
  Administered 2021-02-08 – 2021-02-09 (×2): 75 mg via ORAL
  Filled 2021-02-07 (×3): qty 1

## 2021-02-07 MED ORDER — INSULIN ASPART 100 UNIT/ML ~~LOC~~ SOLN
0.0000 [IU] | Freq: Every day | SUBCUTANEOUS | Status: DC
  Administered 2021-02-08: 2 [IU] via SUBCUTANEOUS

## 2021-02-07 MED ORDER — LORATADINE 10 MG PO TABS
10.0000 mg | ORAL_TABLET | Freq: Every day | ORAL | Status: DC
  Administered 2021-02-08 – 2021-02-09 (×2): 10 mg via ORAL
  Filled 2021-02-07 (×2): qty 1

## 2021-02-07 MED ORDER — ALUM & MAG HYDROXIDE-SIMETH 200-200-20 MG/5ML PO SUSP
30.0000 mL | Freq: Once | ORAL | Status: AC
  Administered 2021-02-07: 30 mL via ORAL
  Filled 2021-02-07: qty 30

## 2021-02-07 MED ORDER — ASPIRIN 81 MG PO CHEW
81.0000 mg | CHEWABLE_TABLET | ORAL | Status: AC
  Administered 2021-02-08: 81 mg via ORAL
  Filled 2021-02-07: qty 1

## 2021-02-07 MED ORDER — SODIUM CHLORIDE 0.9 % WEIGHT BASED INFUSION
1.0000 mL/kg/h | INTRAVENOUS | Status: DC
  Administered 2021-02-08 (×2): 1 mL/kg/h via INTRAVENOUS

## 2021-02-07 MED ORDER — HEPARIN (PORCINE) 25000 UT/250ML-% IV SOLN
1600.0000 [IU]/h | INTRAVENOUS | Status: DC
  Administered 2021-02-07: 1300 [IU]/h via INTRAVENOUS
  Administered 2021-02-08: 1600 [IU]/h via INTRAVENOUS
  Filled 2021-02-07 (×2): qty 250

## 2021-02-07 MED ORDER — FLUTICASONE PROPIONATE 50 MCG/ACT NA SUSP
2.0000 | Freq: Every day | NASAL | Status: DC
  Administered 2021-02-08: 2 via NASAL
  Filled 2021-02-07: qty 16

## 2021-02-07 MED ORDER — ASPIRIN 81 MG PO CHEW
324.0000 mg | CHEWABLE_TABLET | Freq: Once | ORAL | Status: AC
  Administered 2021-02-07: 324 mg via ORAL
  Filled 2021-02-07: qty 4

## 2021-02-07 MED ORDER — HEPARIN BOLUS VIA INFUSION
4000.0000 [IU] | Freq: Once | INTRAVENOUS | Status: AC
  Administered 2021-02-07: 4000 [IU] via INTRAVENOUS
  Filled 2021-02-07: qty 4000

## 2021-02-07 MED ORDER — ONDANSETRON HCL 4 MG/2ML IJ SOLN: 4.0000 mg | Freq: Four times a day (QID) | INTRAMUSCULAR | Status: DC | PRN

## 2021-02-07 MED ORDER — LATANOPROST 0.005 % OP SOLN
1.0000 [drp] | Freq: Every day | OPHTHALMIC | Status: DC
  Administered 2021-02-07 – 2021-02-08 (×2): 1 [drp] via OPHTHALMIC
  Filled 2021-02-07 (×3): qty 2.5

## 2021-02-07 MED ORDER — AMLODIPINE BESYLATE 10 MG PO TABS
10.0000 mg | ORAL_TABLET | Freq: Every day | ORAL | Status: DC
  Administered 2021-02-07 – 2021-02-09 (×3): 10 mg via ORAL
  Filled 2021-02-07 (×2): qty 1
  Filled 2021-02-07: qty 2

## 2021-02-07 MED ORDER — HYDRALAZINE HCL 20 MG/ML IJ SOLN: 10.0000 mg | Freq: Three times a day (TID) | INTRAMUSCULAR | Status: DC | PRN

## 2021-02-07 MED ORDER — OMEGA-3-ACID ETHYL ESTERS 1 G PO CAPS
1.0000 g | ORAL_CAPSULE | Freq: Every day | ORAL | Status: DC
  Administered 2021-02-08 – 2021-02-09 (×2): 1 g via ORAL
  Filled 2021-02-07 (×2): qty 1

## 2021-02-07 MED ORDER — SODIUM CHLORIDE 0.9% FLUSH
3.0000 mL | Freq: Two times a day (BID) | INTRAVENOUS | Status: DC
  Administered 2021-02-08 (×2): 3 mL via INTRAVENOUS

## 2021-02-07 MED ORDER — SODIUM CHLORIDE 0.9 % WEIGHT BASED INFUSION
3.0000 mL/kg/h | INTRAVENOUS | Status: DC
  Administered 2021-02-08: 3 mL/kg/h via INTRAVENOUS

## 2021-02-07 MED ORDER — BENAZEPRIL HCL 20 MG PO TABS
20.0000 mg | ORAL_TABLET | Freq: Every day | ORAL | Status: DC
  Administered 2021-02-07 – 2021-02-09 (×3): 20 mg via ORAL
  Filled 2021-02-07 (×3): qty 1

## 2021-02-07 MED ORDER — ADULT MULTIVITAMIN W/MINERALS CH
1.0000 | ORAL_TABLET | Freq: Every day | ORAL | Status: DC
  Administered 2021-02-08 – 2021-02-09 (×2): 1 via ORAL
  Filled 2021-02-07 (×2): qty 1

## 2021-02-07 MED ORDER — SODIUM CHLORIDE 0.9 % IV SOLN: 250.0000 mL | INTRAVENOUS | Status: DC | PRN

## 2021-02-07 NOTE — Progress Notes (Signed)
  Echocardiogram 2D Echocardiogram has been performed.  Zayvien Canning G Kloe Oates 02/07/2021, 1:17 PM

## 2021-02-07 NOTE — ED Notes (Signed)
[facility] call to transport the patient to [facility].

## 2021-02-07 NOTE — Progress Notes (Signed)
ANTICOAGULATION CONSULT NOTE - Initial Consult  Pharmacy Consult for Heparin Indication: chest pain/ACS  No Known Allergies  Patient Measurements: Height: 5\' 9"  (175.3 cm) Weight: 127.5 kg (281 lb 1.4 oz) IBW/kg (Calculated) : 70.7 Heparin Dosing Weight: 100 kg  Vital Signs: Temp: 98.9 F (37.2 C) (04/03 0625) Temp Source: Oral (04/03 0625) BP: 150/81 (04/03 1430) Pulse Rate: 64 (04/03 1430)  Labs: Recent Labs    02/07/21 0625 02/07/21 0820 02/07/21 1210  HGB 15.5  --   --   HCT 47.2  --   --   PLT 419*  --   --   CREATININE  --  1.00  --   TROPONINIHS 61* 71* 88*    Estimated Creatinine Clearance: 83 mL/min (by C-G formula based on SCr of 1 mg/dL).   Medical History: Past Medical History:  Diagnosis Date  . Arthritis   . Cancer [facility])    prostate cancer , basal cell skin cancer removed from nose 10/2014   . Cancer [facility])    prostate cancer  . Cancer ([place])    melanoma on nose  . Complication of anesthesia   . Diabetes mellitus without complication ([place])    TYPE 2   . Environmental allergies   . Glaucoma   . Hyperlipidemia   . Hypertension   . PND (post-nasal drip)    SYMPTOMS OF COLD ONSET 2 WEEKS AGO ; HAS HAD DRY COUGH ,MENTIONS LESS COUGHING TODAY NO EXPETORANT ; AFEBRILE ; USING NASAL WASHES FOR PND   . Sinus problem   . Sleep apnea    cpap- setting at 12   . Sleep apnea     Medications:  Scheduled:  . amLODipine  10 mg Oral Daily   And  . benazepril  20 mg Oral Daily  . simethicone  80 mg Oral QID   Infusions:   PRN: hydrALAZINE, nitroGLYCERIN  Assessment: 75 yo male with multiple cardiac risk factors who presents with chest pain.  Pharmacy consulted to dose IV heparin.  Not taking anticoagulations PTA, H/H WNL, Plts elevated, SCr 1.0, baseline PTT, PT/INR pending.  Goal of Therapy:  Heparin level 0.3-0.7 units/ml Monitor platelets by anticoagulation protocol: Yes   Plan:  Heparin 4000 units IV bolus x 1 Heparin 1300 units/hr IV  infusion Check heparin level in 8hrs Daily heparin level and CBC  Peggyann Juba, PharmD, BCPS Pharmacy: [phone] 02/07/2021,3:36 PM

## 2021-02-07 NOTE — Consult Note (Signed)
Cardiology Consultation:   Patient ID: Justin Cohen MRN: [id number]; DOB: 1946/08/23  Admit date: 02/07/2021 Date of Consult: 02/07/2021  PCP:  Antony Contras, MD   [place]  Cardiologist:  Cohen primary care provider on file.  Advanced Practice Provider:  No care team member to display Electrophysiologist:  None        Patient Profile:   Justin Cohen is a 75 y.o. male with a hx of diabetes hypertension hyperlipidemia morbid obesity prostate cancer who is being seen today for the evaluation of chest pain at the request of Dr. Marylyn Ishihara.  History of Present Illness:   Justin Cohen is a 75 year old male with diabetes hypertension hyperlipidemia prior prostate cancer who presented with chest pressure worsening indigestion type symptoms felt about 2 days ago.  Perhaps belching was increased and pressure was noted.  However this seemed to increase with activity.  Tums did not seem to help.  Began to feel more like pressure and heaviness began to worry him.  Moving from his bed to recliner seem to help somewhat.  Cohen significant change in shortness of breath.  Troponin mildly elevated at 88.  Creatinine 1.0 EKG with sinus bradycardia rate 50 with T wave inversions noted in 1 and aVL,, original EKG may have had some J-point elevation in 2 and 3 however wandering baseline noted. T wave inversion in 1 and aVL is new when compared to 2019 EKG.  Echocardiogram preliminary view shows potentially mildly reduced ejection fraction in the 45% range with anteroseptal wall mid to distal akinesis.  Past Medical History:  Diagnosis Date  . Arthritis   . Cancer [facility])    prostate cancer , basal cell skin cancer removed from nose 10/2014   . Cancer [facility])    prostate cancer  . Cancer ([place])    melanoma on nose  . Complication of anesthesia   . Diabetes mellitus without complication ([place])    TYPE 2   . Environmental allergies   . Glaucoma   . Hyperlipidemia   . Hypertension    . PND (post-nasal drip)    SYMPTOMS OF COLD ONSET 2 WEEKS AGO ; HAS HAD DRY COUGH ,MENTIONS LESS COUGHING TODAY Cohen EXPETORANT ; AFEBRILE ; USING NASAL WASHES FOR PND   . Sinus problem   . Sleep apnea    cpap- setting at 12   . Sleep apnea     Past Surgical History:  Procedure Laterality Date  . BUNIONECTOMY  2014   right foot  . CHOLECYSTECTOMY N/A 03/12/2015   Procedure: LAPAROSCOPIC CHOLECYSTECTOMY CHOLANGIOGRAM WAS NOT PERFORMED;  Surgeon: Ralene Ok, MD;  Location: [facility]S;  Service: General;  Laterality: N/A;  . ERCP N/A 03/08/2015   Procedure: ENDOSCOPIC RETROGRADE CHOLANGIOPANCREATOGRAPHY (ERCP);  Surgeon: Inda Castle, MD;  Location: [facility];  Service: Gastroenterology;  Laterality: N/A;  . ERCP N/A 03/10/2015   Procedure: ENDOSCOPIC RETROGRADE CHOLANGIOPANCREATOGRAPHY (ERCP);  Surgeon: Clarene Essex, MD;  Location: [facility];  Service: Endoscopy;  Laterality: N/A;  . ERCP N/A 05/21/2015   Procedure: ENDOSCOPIC RETROGRADE CHOLANGIOPANCREATOGRAPHY (ERCP) with spyglass and stent removal;  Surgeon: Clarene Essex, MD;  Location: [facility];  Service: Endoscopy;  Laterality: N/A;  . HERNIA REPAIR    . HERNIA REPAIR  2010  . JOINT REPLACEMENT  02/17/15   left knee replacement  . JOINT REPLACEMENT  2001   right knee replacement  . JOINT REPLACEMENT  2012   "right knee cap replacement"  . KNEE SURGERY  X 2  . melanoma removal  Dec 2015   removed from nose  . PROSTATE SURGERY    . PROSTATE SURGERY  2009   due to prostate cancer  . TOTAL HIP ARTHROPLASTY Right 11/21/2017   Procedure: RIGHT TOTAL HIP ARTHROPLASTY ANTERIOR APPROACH;  Surgeon: Paralee Cancel, MD;  Location: [facility];  Service: Orthopedics;  Laterality: Right;  70 mins  . TOTAL KNEE ARTHROPLASTY Left 02/17/2015   Procedure: LEFT TOTAL KNEE ARTHROPLASTY;  Surgeon: Paralee Cancel, MD;  Location: [facility];  Service: Orthopedics;  Laterality: Left;     Home Medications:  Prior to Admission medications   Medication Sig Start  Date End Date Taking? Authorizing Provider  amLODipine-benazepril (LOTREL) 10-20 MG capsule Take 1 capsule by mouth daily. 02/02/21  Yes [provider]  cetirizine (ZYRTEC) 10 MG tablet Take 10 mg by mouth daily.   Yes [provider]  Cholecalciferol (VITAMIN D) 2000 UNITS tablet Take 2,000 Units by mouth daily.   Yes [provider]  fluticasone (FLONASE) 50 MCG/ACT nasal spray Place 2 sprays into both nostrils daily.   Yes [provider]  latanoprost (XALATAN) 0.005 % ophthalmic solution Place 1 drop into both eyes at bedtime.  08/23/17  Yes [provider]  metFORMIN (GLUCOPHAGE) 1000 MG tablet Take 1,000 mg by mouth 2 (two) times daily with a meal.   Yes [provider]  Multiple Vitamin (MULTIVITAMIN WITH MINERALS) TABS tablet Take 1 tablet by mouth daily.    Yes [provider]  Omega-3 Fatty Acids (FISH OIL) 1000 MG CAPS Take 1,000 mg by mouth daily.    Yes [provider]  pioglitazone (ACTOS) 45 MG tablet Take 45 mg by mouth daily.  01/27/15  Yes [provider]  Probiotic Product (PROBIOTIC DAILY PO) Take 1 capsule by mouth daily.   Yes [provider]  rosuvastatin (CRESTOR) 20 MG tablet Take 5 mg by mouth every evening.  11/08/16  Yes [provider]  venlafaxine XR (EFFEXOR-XR) 75 MG 24 hr capsule Take 75 mg by mouth daily with breakfast.  12/16/14  Yes [provider]  docusate sodium (COLACE) 100 MG capsule Take 1 capsule (100 mg total) by mouth 2 (two) times daily. Patient not taking: Cohen sig reported 11/21/17   Danae Orleans, PA-C  ferrous sulfate (FERROUSUL) 325 (65 FE) MG tablet Take 1 tablet (325 mg total) by mouth 3 (three) times daily with meals. Patient not taking: Cohen sig reported 11/21/17   Danae Orleans, PA-C  HYDROcodone-acetaminophen (NORCO) 7.5-325 MG tablet Take 1-2 tablets by mouth every 4 (four) hours as needed for moderate pain or severe pain. Patient not taking:  Cohen sig reported 11/21/17   Danae Orleans, PA-C  methocarbamol (ROBAXIN) 500 MG tablet Take 1 tablet (500 mg total) by mouth every 6 (six) hours as needed for muscle spasms. Patient not taking: Cohen sig reported 11/21/17   Danae Orleans, PA-C  polyethylene glycol (MIRALAX / GLYCOLAX) packet Take 17 g by mouth 2 (two) times daily. Patient not taking: Cohen sig reported 11/21/17   Danae Orleans, PA-C    Inpatient Medications: Scheduled Meds: . amLODipine  10 mg Oral Daily   And  . benazepril  20 mg Oral Daily  . simethicone  80 mg Oral QID   Continuous Infusions:  PRN Meds: hydrALAZINE, nitroGLYCERIN  Allergies:   Cohen Known Allergies  Social History:   Social History   Socioeconomic History  . Marital status: Married    Spouse name: Not on  file  . Number of children: Not on file  . Years of education: Not on file  . Highest education level: Not on file  Occupational History  . Not on file  Tobacco Use  . Smoking status: Former Smoker    Types: Cigarettes    Quit date: 03/05/1973    Years since quitting: 47.9  . Smokeless tobacco: Never Used  Vaping Use  . Vaping Use: Never used  Substance and Sexual Activity  . Alcohol use: Yes    Comment: occasional glass of wine   . Drug use: Cohen  . Sexual activity: Not on file  Other Topics Concern  . Not on file  Social History Narrative   ** Merged History Encounter **       Social Determinants of Health   Financial Resource Strain: Not on file  Food Insecurity: Not on file  Transportation Needs: Not on file  Physical Activity: Not on file  Stress: Not on file  Social Connections: Not on file  Intimate Partner Violence: Not on file    Family History:    Family History  Problem Relation Age of Onset  . Heart failure Mother   . CAD Father   . Lung cancer Sister      ROS:  Please see the history of present illness.   All other ROS reviewed and negative.     Physical Exam/Data:   Vitals:   02/07/21 1100 02/07/21  1130 02/07/21 1426 02/07/21 1430  BP: (!) 148/72 (!) 148/78 (!) 153/91 (!) 150/81  Pulse: (!) 57 65 71 64  Resp: 10 14 11 11   Temp:      TempSrc:      SpO2: 94% 94% 93% 93%  Weight:      Height:       Cohen intake or output data in the 24 hours ending 02/07/21 1529 Last 3 Weights 02/07/2021 11/21/2017 11/16/2017  Weight (lbs) 281 lb 1.4 oz 281 lb 281 lb  Weight (kg) 127.5 kg 127.461 kg 127.461 kg     Body mass index is 41.51 kg/m.  General:  Well nourished, well developed, in Cohen acute distress HEENT: normal Lymph: Cohen adenopathy Neck: Cohen JVD Endocrine:  Cohen thryomegaly Vascular: Cohen carotid bruits; FA pulses 2+ bilaterally without bruits  Cardiac:  normal S1, S2; RRR; Cohen murmur  Lungs:  clear to auscultation bilaterally, Cohen wheezing, rhonchi or rales  Abd: soft, nontender, Cohen hepatomegaly  Ext: Cohen edema Musculoskeletal:  Cohen deformities, BUE and BLE strength normal and equal Skin: warm and dry  Neuro:  CNs 2-12 intact, Cohen focal abnormalities noted Psych:  Normal affect   EKG:  The EKG was personally reviewed and demonstrates:  As above  Telemetry:  Telemetry was personally reviewed and demonstrates:  NSR  Relevant CV Studies:  ECHO as above  Laboratory Data:  High Sensitivity Troponin:   Recent Labs  Lab 02/07/21 0625 02/07/21 0820 02/07/21 1210  TROPONINIHS 61* 71* 88*     Chemistry Recent Labs  Lab 02/07/21 0820  NA 138  K 4.9  CL 105  CO2 27  GLUCOSE 165*  BUN 28*  CREATININE 1.00  CALCIUM 9.7  GFRNONAA >60  ANIONGAP 6    Cohen results for input(s): PROT, ALBUMIN, AST, ALT, ALKPHOS, BILITOT in the last 168 hours. Hematology Recent Labs  Lab 02/07/21 0625  WBC 6.7  RBC 5.43  HGB 15.5  HCT 47.2  MCV 86.9  MCH 28.5  MCHC 32.8  RDW 15.7*  PLT 419*  BNP Recent Labs  Lab 02/07/21 1210  BNP 35.1    DDimer Cohen results for input(s): DDIMER in the last 168 hours.   Radiology/Studies:  DG Chest 2 View  Result Date: 02/07/2021 CLINICAL DATA:   75 year old male with chest pain for 2-3 days exacerbated by lying down. EXAM: CHEST - 2 VIEW COMPARISON:  Chest radiographs 09/11/2017 and earlier. FINDINGS: Lung volumes and mediastinal contours are stable and within normal limits, mild eventration of the right hemidiaphragm (normal variant). Visualized tracheal air column is within normal limits. Stable lung markings since 2016. Cohen pneumothorax, pulmonary edema, pleural effusion or confluent pulmonary opacity. Cohen acute osseous abnormality identified. Paucity of bowel gas in the upper abdomen. IMPRESSION: Cohen acute cardiopulmonary abnormality. Electronically Signed   By: Genevie Ann M.D.   On: 02/07/2021 07:27     Assessment and Plan:   Unstable angina -Multiple cardiac risk factors including diabetes which is a coronary artery disease equivalent, hypertension, hyperlipidemia, morbid obesity. -We will start IV heparin. -Given his T wave inversion noted in the lateral leads on ECG as well as echocardiogram demonstrating what appears to be an anteroseptal wall akinetic segment, we will proceed tomorrow with cardiac catheterization.  Risks and benefits have been explained including stroke heart attack death renal impairment bleeding.  He is willing to proceed.  He will be transported to [facility] for this. -His symptoms may all be GI related however given his multitude of risk factors, EKG, mildly elevated troponin proceeding with further work-up.  Diabetes with hypertension -Will be managed per primary team    Risk Assessment/Risk Scores:     TIMI Risk Score for Unstable Angina or Non-ST Elevation MI:   The patient's TIMI risk score is 6, which indicates a 41% risk of all cause mortality, new or recurrent myocardial infarction or need for urgent revascularization in the next 14 days.      For questions or updates, please contact [place] Please consult [email or web address] for contact info under    Signed, Candee Furbish, MD  02/07/2021 3:29  PM

## 2021-02-07 NOTE — ED Notes (Signed)
ED TO INPATIENT HANDOFF REPORT  ED Nurse Name and Phone #: [phone]  S Name/Age/Gender Justin Cohen 75 y.o. male Room/Bed: WA14/WA14  Code Status   Code Status: Prior  Home/SNF/Other Home Patient oriented to: self, place, time and situation Is this baseline? Yes   Triage Complete: Triage complete  Chief Complaint Chest pain due to CAD [facility]) [I25.119]  Triage Note Pt reports indigestion for the last 2-3 days. States that it worsened when laying down and feels better when he sits up. Reports that he has tried Tums and Gaffer without relief. No SOB or radiation of pain. Describes the pain as burning, gas, and indigestion. No vomiting. States that it kept him up all night.     Allergies No Known Allergies  Level of Care/Admitting Diagnosis ED Disposition    ED Disposition Condition [facility]: [place] [[id number]]  Level of Care: Telemetry Cardiac [103]  May admit patient to [facility] or [facility] if equivalent level of care is available:: No  Covid Evaluation: Asymptomatic Screening Protocol (No Symptoms)  Diagnosis: Chest pain due to CAD [facility]) [[id number]]  Admitting Physician: Jonnie Finner [[id number]]  Attending Physician: Jonnie Finner [[id number]]  Estimated length of stay: past midnight tomorrow  Certification:: I certify this patient will need inpatient services for at least 2 midnights       B Medical/Surgery History Past Medical History:  Diagnosis Date  . Arthritis   . Cancer [facility])    prostate cancer , basal cell skin cancer removed from nose 10/2014   . Cancer [facility])    prostate cancer  . Cancer ([place])    melanoma on nose  . Complication of anesthesia   . Diabetes mellitus without complication ([place])    TYPE 2   . Environmental allergies   . Glaucoma   . Hyperlipidemia   . Hypertension   . PND (post-nasal drip)    SYMPTOMS OF COLD ONSET 2 WEEKS AGO ; HAS HAD DRY COUGH ,MENTIONS LESS COUGHING  TODAY NO EXPETORANT ; AFEBRILE ; USING NASAL WASHES FOR PND   . Sinus problem   . Sleep apnea    cpap- setting at 10   . Sleep apnea    Past Surgical History:  Procedure Laterality Date  . BUNIONECTOMY  2014   right foot  . CHOLECYSTECTOMY N/A 03/12/2015   Procedure: LAPAROSCOPIC CHOLECYSTECTOMY CHOLANGIOGRAM WAS NOT PERFORMED;  Surgeon: Ralene Ok, MD;  Location: [facility] ORS;  Service: General;  Laterality: N/A;  . ERCP N/A 03/08/2015   Procedure: ENDOSCOPIC RETROGRADE CHOLANGIOPANCREATOGRAPHY (ERCP);  Surgeon: Inda Castle, MD;  Location: [facility]S;  Service: Gastroenterology;  Laterality: N/A;  . ERCP N/A 03/10/2015   Procedure: ENDOSCOPIC RETROGRADE CHOLANGIOPANCREATOGRAPHY (ERCP);  Surgeon: Clarene Essex, MD;  Location: [facility];  Service: Endoscopy;  Laterality: N/A;  . ERCP N/A 05/21/2015   Procedure: ENDOSCOPIC RETROGRADE CHOLANGIOPANCREATOGRAPHY (ERCP) with spyglass and stent removal;  Surgeon: Clarene Essex, MD;  Location: [facility] ENDOSCOPY;  Service: Endoscopy;  Laterality: N/A;  . HERNIA REPAIR    . HERNIA REPAIR  2010  . JOINT REPLACEMENT  02/17/15   left knee replacement  . JOINT REPLACEMENT  2001   right knee replacement  . JOINT REPLACEMENT  2012   "right knee cap replacement"  . KNEE SURGERY     X 2  . melanoma removal  Dec 2015   removed from nose  . PROSTATE SURGERY    . PROSTATE SURGERY  2009   due to prostate cancer  . TOTAL HIP ARTHROPLASTY Right 11/21/2017   Procedure: RIGHT TOTAL HIP ARTHROPLASTY ANTERIOR APPROACH;  Surgeon: Paralee Cancel, MD;  Location: [facility];  Service: Orthopedics;  Laterality: Right;  70 mins  . TOTAL KNEE ARTHROPLASTY Left 02/17/2015   Procedure: LEFT TOTAL KNEE ARTHROPLASTY;  Surgeon: Paralee Cancel, MD;  Location: [facility];  Service: Orthopedics;  Laterality: Left;     A IV Location/Drains/Wounds Patient Lines/Drains/Airways Status    Active Line/Drains/Airways    Name Placement date Placement time Site Days   Peripheral IV 02/07/21 Left  Antecubital 02/07/21  0635  Antecubital  less than 1   Incision (Closed) 11/21/17 Hip Right 11/21/17  0803  -- 1174          Intake/Output Last 24 hours No intake or output data in the 24 hours ending 02/07/21 1450  Labs/Imaging Results for orders placed or performed during the hospital encounter of 02/07/21 (from the past 48 hour(s))  CBC     Status: Abnormal   Collection Time: 02/07/21  6:25 AM  Result Value Ref Range   WBC 6.7 4.0 - 10.5 K/uL   RBC 5.43 4.22 - 5.81 MIL/uL   Hemoglobin 15.5 13.0 - 17.0 g/dL   HCT 47.2 39.0 - 52.0 %   MCV 86.9 80.0 - 100.0 fL   MCH 28.5 26.0 - 34.0 pg   MCHC 32.8 30.0 - 36.0 g/dL   RDW 15.7 (H) 11.5 - 15.5 %   Platelets 419 (H) 150 - 400 K/uL   nRBC 0.0 0.0 - 0.2 %    Comment: Performed at [facility], [place] [address] [place], [place] [address]  Troponin I (High Sensitivity)     Status: Abnormal   Collection Time: 02/07/21  6:25 AM  Result Value Ref Range   Troponin I (High Sensitivity) 61 (H) <18 ng/L    Comment: (NOTE) Elevated high sensitivity troponin I (hsTnI) values and significant  changes across serial measurements may suggest ACS but many other  chronic and acute conditions are known to elevate hsTnI results.  Refer to the Links section for chest pain algorithms and additional  guidance. Performed at [facility], [place] [address] [place], [place] [address]   Lipase, blood     Status: None   Collection Time: 02/07/21  6:26 AM  Result Value Ref Range   Lipase 29 11 - 51 U/L    Comment: Performed at [facility], [place] [address] [place], [place] [address]  Troponin I (High Sensitivity)     Status: Abnormal   Collection Time: 02/07/21  8:20 AM  Result Value Ref Range   Troponin I (High Sensitivity) 71 (H) <18 ng/L    Comment: (NOTE) Elevated high sensitivity troponin I (hsTnI) values and significant  changes across serial measurements may suggest ACS but many other   chronic and acute conditions are known to elevate hsTnI results.  Refer to the "Links" section for chest pain algorithms and additional  guidance. Performed at [facility], [place] [address], [place], [place] [address]   Basic metabolic panel     Status: Abnormal   Collection Time: 02/07/21  8:20 AM  Result Value Ref Range   Sodium 138 135 - 145 mmol/L   Potassium 4.9 3.5 - 5.1 mmol/L   Chloride 105 98 - 111 mmol/L   CO2 27 22 - 32 mmol/L   Glucose, Bld 165 (H) 70 - 99 mg/dL  Comment: Glucose reference range applies only to samples taken after fasting for at least 8 hours.   BUN 28 (H) 8 - 23 mg/dL   Creatinine, Ser 1.00 0.61 - 1.24 mg/dL   Calcium 9.7 8.9 - 10.3 mg/dL   GFR, Estimated >60 >60 mL/min    Comment: (NOTE) Calculated using the CKD-EPI Creatinine Equation (2021)    Anion gap 6 5 - 15    Comment: Performed at [facility], [place] [address], [place], [place] [address]  Resp Panel by RT-PCR (Flu A&B, Covid) Nasopharyngeal Swab     Status: None   Collection Time: 02/07/21 10:08 AM   Specimen: Nasopharyngeal Swab; Nasopharyngeal(NP) swabs in vial transport medium  Result Value Ref Range   SARS Coronavirus 2 by RT PCR NEGATIVE NEGATIVE    Comment: (NOTE) SARS-CoV-2 target nucleic acids are NOT DETECTED.  The SARS-CoV-2 RNA is generally detectable in upper respiratory specimens during the acute phase of infection. The lowest concentration of SARS-CoV-2 viral copies this assay can detect is 138 copies/mL. A negative result does not preclude SARS-Cov-2 infection and should not be used as the sole basis for treatment or other patient management decisions. A negative result may occur with  improper specimen collection/handling, submission of specimen other than nasopharyngeal swab, presence of viral mutation(s) within the areas targeted by this assay, and inadequate number of viral copies(<138 copies/mL). A negative result must be  combined with clinical observations, patient history, and epidemiological information. The expected result is Negative.  Fact Sheet for Patients:  EntrepreneurPulse.com.au  Fact Sheet for Healthcare Providers:  IncredibleEmployment.be  This test is no t yet approved or cleared by the [place] FDA and  has been authorized for detection and/or diagnosis of SARS-CoV-2 by FDA under an Emergency Use Authorization (EUA). This EUA will remain  in effect (meaning this test can be used) for the duration of the COVID-19 declaration under Section 564(b)(1) of the Act, 21 U.S.C.section 360bbb-3(b)(1), unless the authorization is terminated  or revoked sooner.       Influenza A by PCR NEGATIVE NEGATIVE   Influenza B by PCR NEGATIVE NEGATIVE    Comment: (NOTE) The Xpert Xpress SARS-CoV-2/FLU/RSV plus assay is intended as an aid in the diagnosis of influenza from Nasopharyngeal swab specimens and should not be used as a sole basis for treatment. Nasal washings and aspirates are unacceptable for Xpert Xpress SARS-CoV-2/FLU/RSV testing.  Fact Sheet for Patients: EntrepreneurPulse.com.au  Fact Sheet for Healthcare Providers: IncredibleEmployment.be  This test is not yet approved or cleared by the [place] FDA and has been authorized for detection and/or diagnosis of SARS-CoV-2 by FDA under an Emergency Use Authorization (EUA). This EUA will remain in effect (meaning this test can be used) for the duration of the COVID-19 declaration under Section 564(b)(1) of the Act, 21 U.S.C. section 360bbb-3(b)(1), unless the authorization is terminated or revoked.  Performed at [facility], [place] [address] [place], [place] [address]   Troponin I (High Sensitivity)     Status: Abnormal   Collection Time: 02/07/21 12:10 PM  Result Value Ref Range   Troponin I (High Sensitivity) 88 (H) <18 ng/L     Comment: (NOTE) Elevated high sensitivity troponin I (hsTnI) values and significant  changes across serial measurements may suggest ACS but many other  chronic and acute conditions are known to elevate hsTnI results.  Refer to the "Links" section for chest pain algorithms and additional  guidance. Performed at [facility], [place]  [address], [place], [place] [address]   Brain natriuretic peptide     Status: None   Collection Time: 02/07/21 12:10 PM  Result Value Ref Range   B Natriuretic Peptide 35.1 0.0 - 100.0 pg/mL    Comment: Performed at [facility], [place] [address], [place], [place] [address]   DG Chest 2 View  Result Date: 02/07/2021 CLINICAL DATA:  75 year old male with chest pain for 2-3 days exacerbated by lying down. EXAM: CHEST - 2 VIEW COMPARISON:  Chest radiographs 09/11/2017 and earlier. FINDINGS: Lung volumes and mediastinal contours are stable and within normal limits, mild eventration of the right hemidiaphragm (normal variant). Visualized tracheal air column is within normal limits. Stable lung markings since 2016. No pneumothorax, pulmonary edema, pleural effusion or confluent pulmonary opacity. No acute osseous abnormality identified. Paucity of bowel gas in the upper abdomen. IMPRESSION: No acute cardiopulmonary abnormality. Electronically Signed   By: Genevie Ann M.D.   On: 02/07/2021 07:27    Pending Labs [employer] (From admission, onward)          Start     Ordered   Signed and Held  CBC  (enoxaparin (LOVENOX)    CrCl >/= 30 ml/min)  Once,   R       Comments: Baseline for enoxaparin therapy IF NOT ALREADY DRAWN.  Notify MD if PLT < 100 K.    Signed and Held   Signed and Held  Creatinine, serum  (enoxaparin (LOVENOX)    CrCl >/= 30 ml/min)  Once,   R       Comments: Baseline for enoxaparin therapy IF NOT ALREADY DRAWN.    Signed and Held   Signed and Held  Creatinine, serum  (enoxaparin (LOVENOX)    CrCl >/= 30 ml/min)   Weekly,   R     Comments: while on enoxaparin therapy    Signed and Held   Signed and Held  Lipid panel  Once,   R        Signed and Held   Signed and Held  Hemoglobin A1c  Once,   R       Comments: To assess prior glycemic control    Signed and Held          Vitals/Pain Today's Vitals   02/07/21 1100 02/07/21 1130 02/07/21 1426 02/07/21 1430  BP: (!) 148/72 (!) 148/78 (!) 153/91 (!) 150/81  Pulse: (!) 57 65 71 64  Resp: 10 14 11 11   Temp:      TempSrc:      SpO2: 94% 94% 93% 93%  Weight:      Height:      PainSc:        Isolation Precautions Airborne and Contact precautions  Medications Medications  nitroGLYCERIN (NITROSTAT) SL tablet 0.4 mg (has no administration in time range)  simethicone (MYLICON) chewable tablet 80 mg (has no administration in time range)  amLODipine (NORVASC) tablet 10 mg (10 mg Oral Given 02/07/21 1337)    And  benazepril (LOTENSIN) tablet 20 mg (20 mg Oral Given 02/07/21 1337)  hydrALAZINE (APRESOLINE) injection 10 mg (has no administration in time range)  alum & mag hydroxide-simeth (MAALOX/MYLANTA) 200-200-20 MG/5ML suspension 30 mL (30 mLs Oral Given 02/07/21 0981)  aspirin chewable tablet 324 mg (324 mg Oral Given 02/07/21 0856)    Mobility walks Low fall risk   Focused Assessments .   R Recommendations: See Admitting Provider Note  Report given to:   Additional Notes: n/a

## 2021-02-07 NOTE — ED Triage Notes (Addendum)
Pt reports indigestion for the last 2-3 days. States that it worsened when laying down and feels better when he sits up. Reports that he has tried Tums and Gaffer without relief. No SOB or radiation of pain. Describes the pain as burning, gas, and indigestion. No vomiting. States that it kept him up all night.

## 2021-02-07 NOTE — H&P (Signed)
History and Physical    Justin Cohen CVE:[id number] DOB: [birth year] DOA: 02/07/2021  PCP: Antony Contras, MD  Patient coming from: Home  Chief Complaint: chest pressure, indigestion  HPI: Justin Cohen is a 75 y.o. male with medical history significant of prostate cancer, DN2, HTN, HLD. Presenting with chest pressure, indigestion. He reports that he really started noticing his indigestion 2 days ago. He noticed increase belching and pressure. It worsened with activity. He tried TUMS and apple cider vinegar to help, but it did not. This transitioned last night in to heavy chest pressure. He says the "heaviness" worried him. He got up from his bed to his recliner and this seemed to help it a little; however, it did not completely relieve the situation. He says the pressure didn't change his breathing; but the sensation did worsen w/ movement and laying flat. He became concerned and came to the ED. He denies any other aggravating or alleviating factors.   ED Course: He had small elevation in trp. First EKG was concerning for st depressions. His he was given ASA and maalox. The maalox did not relieve his condition. EDP spoke with cardiology. Possible cath tomorrow. TRH was called for admission.   Review of Systems:  Denies palpitations, dyspnea, N/V/D, diaphoresis, syncopal episodes. Review of systems is otherwise negative for all not mentioned in HPI.   PMHx Past Medical History:  Diagnosis Date  . Arthritis   . Cancer [facility])    prostate cancer , basal cell skin cancer removed from nose 10/2014   . Cancer [facility])    prostate cancer  . Cancer ([place])    melanoma on nose  . Complication of anesthesia   . Diabetes mellitus without complication ([place])    TYPE 2   . Environmental allergies   . Glaucoma   . Hyperlipidemia   . Hypertension   . PND (post-nasal drip)    SYMPTOMS OF COLD ONSET 2 WEEKS AGO ; HAS HAD DRY COUGH ,MENTIONS LESS COUGHING TODAY NO EXPETORANT ; AFEBRILE ; USING NASAL  WASHES FOR PND   . Sinus problem   . Sleep apnea    cpap- setting at 10   . Sleep apnea     PSHx Past Surgical History:  Procedure Laterality Date  . BUNIONECTOMY  2014   right foot  . CHOLECYSTECTOMY N/A 03/12/2015   Procedure: LAPAROSCOPIC CHOLECYSTECTOMY CHOLANGIOGRAM WAS NOT PERFORMED;  Surgeon: Ralene Ok, MD;  [facility]: [facility];  Service: General;  Laterality: N/A;  . ERCP N/A 03/08/2015   Procedure: ENDOSCOPIC RETROGRADE CHOLANGIOPANCREATOGRAPHY (ERCP);  Surgeon: Inda Castle, MD;  [facility]: [facility];  Service: Gastroenterology;  Laterality: N/A;  . ERCP N/A 03/10/2015   Procedure: ENDOSCOPIC RETROGRADE CHOLANGIOPANCREATOGRAPHY (ERCP);  Surgeon: Clarene Essex, MD;  Location: [facility];  Service: Endoscopy;  Laterality: N/A;  . ERCP N/A 05/21/2015   Procedure: ENDOSCOPIC RETROGRADE CHOLANGIOPANCREATOGRAPHY (ERCP) with spyglass and stent removal;  Surgeon: Clarene Essex, MD;  Location: [facility] ENDOSCOPY;  Service: Endoscopy;  Laterality: N/A;  . HERNIA REPAIR    . HERNIA REPAIR  2010  . JOINT REPLACEMENT  02/17/15   left knee replacement  . JOINT REPLACEMENT  2001   right knee replacement  . JOINT REPLACEMENT  2012   "right knee cap replacement"  . KNEE SURGERY     X 2  . melanoma removal  Dec 2015   removed from nose  . PROSTATE SURGERY    . PROSTATE SURGERY  2009   due to prostate cancer  .  TOTAL HIP ARTHROPLASTY Right 11/21/2017   Procedure: RIGHT TOTAL HIP ARTHROPLASTY ANTERIOR APPROACH;  Surgeon: Paralee Cancel, MD;  [facility];  Service: Orthopedics;  Laterality: Right;  70 mins  . TOTAL KNEE ARTHROPLASTY Left 02/17/2015   Procedure: LEFT TOTAL KNEE ARTHROPLASTY;  Surgeon: Paralee Cancel, MD;  [facility];  Service: Orthopedics;  Laterality: Left;    SocHx  reports that he quit smoking about 47 years ago. His smoking use included cigarettes. He has never used smokeless tobacco. He reports current alcohol use. He reports that he does not use drugs.  No Known  Allergies  FamHx Family History  Problem Relation Age of Onset  . Heart failure Mother   . CAD Father   . Lung cancer Sister     Prior to Admission medications   Medication Sig Start Date End Date Taking? Authorizing Provider  amLODipine-benazepril (LOTREL) 5-10 MG per capsule Take 1 capsule by mouth every morning.     [provider]  cetirizine (ZYRTEC) 10 MG tablet Take 10 mg by mouth daily.    [provider]  Cholecalciferol (VITAMIN D) 2000 UNITS tablet Take 2,000 Units by mouth daily.    [provider]  docusate sodium (COLACE) 100 MG capsule Take 1 capsule (100 mg total) by mouth 2 (two) times daily. 11/21/17   Danae Orleans, PA-C  ferrous sulfate (FERROUSUL) 325 (65 FE) MG tablet Take 1 tablet (325 mg total) by mouth 3 (three) times daily with meals. 11/21/17   Danae Orleans, PA-C  fluticasone (FLONASE) 50 MCG/ACT nasal spray Place 2 sprays into both nostrils daily.     [provider]  glipiZIDE (GLUCOTROL) 5 MG tablet Take 5 mg by mouth daily before breakfast.  01/21/15   [provider]  HYDROcodone-acetaminophen (NORCO) 7.5-325 MG tablet Take 1-2 tablets by mouth every 4 (four) hours as needed for moderate pain or severe pain. 11/21/17   Danae Orleans, PA-C  lansoprazole (PREVACID) 15 MG capsule Take 15 mg by mouth every morning.     [provider]  latanoprost (XALATAN) 0.005 % ophthalmic solution Place 1 drop into both eyes at bedtime.  08/23/17   [provider]  metFORMIN (GLUCOPHAGE) 1000 MG tablet Take 1,000 mg by mouth 2 (two) times daily with a meal.    [provider]  methocarbamol (ROBAXIN) 500 MG tablet Take 1 tablet (500 mg total) by mouth every 6 (six) hours as needed for muscle spasms. 11/21/17   Danae Orleans, PA-C  Multiple Vitamin (MULTIVITAMIN WITH MINERALS) TABS tablet Take 1 tablet by mouth daily.     [provider]  Omega-3 Fatty Acids (FISH OIL) 1000 MG CAPS Take 1,000  mg by mouth daily.     [provider]  Omega-3 Fatty Acids (FISH OIL) 1000 MG CAPS Take 1,000 mg by mouth daily.    [provider]  pioglitazone (ACTOS) 45 MG tablet Take 45 mg by mouth daily.  01/27/15   [provider]  polyethylene glycol (MIRALAX / GLYCOLAX) packet Take 17 g by mouth 2 (two) times daily. 11/21/17   Danae Orleans, PA-C  Probiotic Product (PROBIOTIC DAILY PO) Take 1 capsule by mouth daily.    [provider]  rosuvastatin (CRESTOR) 20 MG tablet Take 5 mg by mouth every evening.  11/08/16   [provider]  venlafaxine XR (EFFEXOR-XR) 75 MG 24 hr capsule Take 75 mg by mouth daily with breakfast.  12/16/14   [provider]    Physical Exam:  Vitals:   02/07/21 0845 02/07/21 0930 02/07/21 0945 02/07/21 1000  BP: (!) 156/83 (!) 143/76  (!) 135/57  Pulse: (!) 59 (!) 53 (!) 53 (!) 53  Resp: 10 11 11 12   Temp:      TempSrc:      SpO2: 94% 95% 95% 94%  Weight:      Height:        General: 75 y.o. male resting in bed in NAD Eyes: PERRL, normal sclera ENMT: Nares patent w/o discharge, orophaynx clear, dentition normal, ears w/o discharge/lesions/ulcers Neck: Supple, trachea midline Cardiovascular: RRR, +S1, S2, no m/g/r, equal pulses throughout Respiratory: CTABL, no w/r/r, normal WOB GI: BS+, soft, NT, no masses noted, no organomegaly noted MSK: No e/c/c Skin: No rashes, bruises, ulcerations noted Neuro: A&O x 3, no focal deficits Psyc: Appropriate interaction and affect, calm/cooperative  Labs on Admission: I have personally reviewed following labs and imaging studies  CBC: Recent Labs  Lab 02/07/21 0625  WBC 6.7  HGB 15.5  HCT 47.2  MCV 86.9  PLT 518*   Basic Metabolic Panel: Recent Labs  Lab 02/07/21 0820  NA 138  K 4.9  CL 105  CO2 27  GLUCOSE 165*  BUN 28*  CREATININE 1.00  CALCIUM 9.7   GFR: Estimated Creatinine Clearance: 83 mL/min (by C-G formula based on SCr of 1 mg/dL). Liver Function  Tests: No results for input(s): AST, ALT, ALKPHOS, BILITOT, PROT, ALBUMIN in the last 168 hours. Recent Labs  Lab 02/07/21 0626  LIPASE 29   No results for input(s): AMMONIA in the last 168 hours. Coagulation Profile: No results for input(s): INR, PROTIME in the last 168 hours. Cardiac Enzymes: No results for input(s): CKTOTAL, CKMB, CKMBINDEX, TROPONINI in the last 168 hours. BNP (last 3 results) No results for input(s): PROBNP in the last 8760 hours. HbA1C: No results for input(s): HGBA1C in the last 72 hours. CBG: No results for input(s): GLUCAP in the last 168 hours. Lipid Profile: No results for input(s): CHOL, HDL, LDLCALC, TRIG, CHOLHDL, LDLDIRECT in the last 72 hours. Thyroid Function Tests: No results for input(s): TSH, T4TOTAL, FREET4, T3FREE, THYROIDAB in the last 72 hours. Anemia Panel: No results for input(s): VITAMINB12, FOLATE, FERRITIN, TIBC, IRON, RETICCTPCT in the last 72 hours. Urine analysis:    Component Value Date/Time   COLORURINE AMBER (A) 03/31/2015 2031   APPEARANCEUR CLEAR 03/31/2015 2031   LABSPEC 1.016 03/31/2015 2031   PHURINE 6.5 03/31/2015 2031   GLUCOSEU NEGATIVE 03/31/2015 2031   HGBUR NEGATIVE 03/31/2015 2031   BILIRUBINUR NEGATIVE 03/31/2015 2031   KETONESUR NEGATIVE 03/31/2015 2031   PROTEINUR NEGATIVE 03/31/2015 2031   UROBILINOGEN 1.0 03/31/2015 2031   NITRITE NEGATIVE 03/31/2015 2031   LEUKOCYTESUR NEGATIVE 03/31/2015 2031    Radiological Exams on Admission: DG Chest 2 View  Result Date: 02/07/2021 CLINICAL DATA:  75 year old male with chest pain for 2-3 days exacerbated by lying down. EXAM: CHEST - 2 VIEW COMPARISON:  Chest radiographs 09/11/2017 and earlier. FINDINGS: Lung volumes and mediastinal contours are stable and within normal limits, mild eventration of the right hemidiaphragm (normal variant). Visualized tracheal air column is within normal limits. Stable lung markings since 2016. No pneumothorax, pulmonary edema, pleural  effusion or confluent pulmonary opacity. No acute osseous abnormality identified. Paucity of bowel gas in the upper abdomen. IMPRESSION: No acute cardiopulmonary abnormality. Electronically Signed   By: Genevie Ann M.D.   On: 02/07/2021 07:27    EKG: Independently reviewed. Sinus, no st elevations  Assessment/Plan Chest  pain/pressure Elevated troponin Indigestion     - admit to inpt, tele @ [facility]     - describes a persistent left chest pressure that is worse with activity and when laying down     - no previous hx of HF; no peripheral edema     - trp mildly elevated x 2, EKG w/o st elevations     - EDP spoke with cards; possible cath tomorrow, will admit to [facility]     - check echo and trend trp     - check BNP     - no tachy, no fevers, O2 sats are good; no suspicion for clot just yet     - maalox has not helped the pressure sensation; continue home PPI, add simethicone     - NPO pMN in anticipation of possible cath tomorrow  DM2     - SSI, DM2 diet, glucose checks, A1c  HLD     - resume home statin  HTN     - resume home regimen  Glu[place]     - resume home regimen  Morbid obesity     - counseled on diet, lifestyle changes  DVT prophylaxis: heparin  Code Status: DNI, confirmed w/ wife at bedside  Family Communication: w/ wife at bedside  Consults called: Cardiology (Dr. Marlou Porch)   Status is: Inpatient  Remains inpatient appropriate because:Inpatient level of care appropriate due to severity of illness   Dispo: The patient is from: Home              Anticipated d/c is to: Home              Patient currently is not medically stable to d/c.   Difficult to place patient No  Jonnie Finner DO [facility]  If 7PM-7AM, please contact night-coverage [email or web address]  02/07/2021, 10:10 AM

## 2021-02-07 NOTE — ED Provider Notes (Signed)
Justin Cohen Provider Note   CSN: [id number] Arrival date & time: 02/07/21  1287     History Chief Complaint  Patient presents with  . Chest Pain  . Indigestion    Justin Cohen is a 75 y.o. male.  The history is provided by the patient.  Chest Pain Pain location:  Epigastric Pain quality: aching   Pain radiates to:  Does not radiate Pain severity:  Moderate Onset quality:  Gradual Duration:  1 day Timing:  Constant Progression:  Worsening Chronicity:  New Relieved by:  Nothing Worsened by:  Nothing Ineffective treatments:  None tried Associated symptoms: nausea   Risk factors: diabetes mellitus and hypertension   Pt reports he has had some increased gas over 3 days.  Pt reports he had some discomfort in his abdomen.  Pt reports last pm he began having some  pressure in his chest. Pt reports increased this am.  No history of heart disease Pt reports increased symptoms with exercise, decreased with rest     Past Medical History:  Diagnosis Date  . Arthritis   . Cancer [facility])    prostate cancer , basal cell skin cancer removed from nose 10/2014   . Cancer [facility])    prostate cancer  . Cancer ([place])    melanoma on nose  . Complication of anesthesia   . Diabetes mellitus without complication (Justin Cohen)    TYPE 2   . Environmental allergies   . Glaucoma   . Hyperlipidemia   . Hypertension   . PND (post-nasal drip)    SYMPTOMS OF COLD ONSET 2 WEEKS AGO ; HAS HAD DRY COUGH ,MENTIONS LESS COUGHING TODAY NO EXPETORANT ; AFEBRILE ; USING NASAL WASHES FOR PND   . Sinus problem   . Sleep apnea    cpap- setting at 10   . Sleep apnea     Patient Active Problem List   Diagnosis Date Noted  . S/P right THA, AA 11/21/2017  . 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 Malnutrition of moderate degree (Justin Cohen) 04/02/2015  . Abscess   . Hyperlipidemia 03/31/2015  . Pericholecystic abscess 03/31/2015  . Postoperative wound abscess 03/31/2015  .  Cholecystitis, acute 03/13/2015  . Sepsis due to Escherichia coli (Justin Cohen) 03/11/2015  . Choledocholithiasis 03/08/2015  . Cholangitis 03/06/2015  . HTN (hypertension) 03/06/2015  . DM w/o complication type II (Justin Cohen) 03/06/2015  . Encephalopathy due to infection 03/06/2015  . Sleep apnea 03/06/2015  . Morbid obesity (Justin Cohen) 02/18/2015  . S/P left TKA 02/17/2015    Past Surgical History:  Procedure Laterality Date  . BUNIONECTOMY  2014   right foot  . CHOLECYSTECTOMY N/A 03/12/2015   Procedure: LAPAROSCOPIC CHOLECYSTECTOMY CHOLANGIOGRAM WAS NOT PERFORMED;  Surgeon: Ralene Ok, MD;  [facility];  Service: General;  Laterality: N/A;  . ERCP N/A 03/08/2015   Procedure: ENDOSCOPIC RETROGRADE CHOLANGIOPANCREATOGRAPHY (ERCP);  Surgeon: Inda Castle, MD;  [facility];  Service: Gastroenterology;  Laterality: N/A;  . ERCP N/A 03/10/2015   Procedure: ENDOSCOPIC RETROGRADE CHOLANGIOPANCREATOGRAPHY (ERCP);  Surgeon: Clarene Essex, MD;  Location: [facility];  Service: Endoscopy;  Laterality: N/A;  . ERCP N/A 05/21/2015   Procedure: ENDOSCOPIC RETROGRADE CHOLANGIOPANCREATOGRAPHY (ERCP) with spyglass and stent removal;  Surgeon: Clarene Essex, MD;  Location: [facility]OS[facility];  Service: Endoscopy;  Laterality: N/A;  . HERNIA REPAIR    . HERNIA REPAIR  2010  . JOINT REPLACEMENT  02/17/15   left knee replacement  . JOINT REPLACEMENT  2001  right knee replacement  . JOINT REPLACEMENT  2012   "right knee cap replacement"  . KNEE SURGERY     X 2  . melanoma removal  Dec 2015   removed from nose  . PROSTATE SURGERY    . PROSTATE SURGERY  2009   due to prostate cancer  . TOTAL HIP ARTHROPLASTY Right 11/21/2017   Procedure: RIGHT TOTAL HIP ARTHROPLASTY ANTERIOR APPROACH;  Surgeon: Paralee Cancel, MD;  [facility];  Service: Orthopedics;  Laterality: Right;  70 mins  . TOTAL KNEE ARTHROPLASTY Left 02/17/2015   Procedure: LEFT TOTAL KNEE ARTHROPLASTY;  Surgeon: Paralee Cancel, MD;  [facility];   Service: Orthopedics;  Laterality: Left;       Family History  Problem Relation Age of Onset  . Heart failure Mother   . CAD Father   . Lung cancer Sister     Social History   Tobacco Use  . Smoking status: Former Smoker    Types: Cigarettes    Quit date: 03/05/1973    Years since quitting: 47.9  . Smokeless tobacco: Never Used  Vaping Use  . Vaping Use: Never used  Substance Use Topics  . Alcohol use: Yes    Comment: occasional glass of wine   . Drug use: No    Home Medications Prior to Admission medications   Medication Sig Start Date End Date Taking? Authorizing Provider  amLODipine-benazepril (LOTREL) 5-10 MG per capsule Take 1 capsule by mouth every morning.     [provider]  cetirizine (ZYRTEC) 10 MG tablet Take 10 mg by mouth daily.    [provider]  Cholecalciferol (VITAMIN D) 2000 UNITS tablet Take 2,000 Units by mouth daily.    [provider]  docusate sodium (COLACE) 100 MG capsule Take 1 capsule (100 mg total) by mouth 2 (two) times daily. 11/21/17   Danae Orleans, PA-C  ferrous sulfate (FERROUSUL) 325 (65 FE) MG tablet Take 1 tablet (325 mg total) by mouth 3 (three) times daily with meals. 11/21/17   Danae Orleans, PA-C  fluticasone (FLONASE) 50 MCG/ACT nasal spray Place 2 sprays into both nostrils daily.     [provider]  glipiZIDE (GLUCOTROL) 5 MG tablet Take 5 mg by mouth daily before breakfast.  01/21/15   [provider]  HYDROcodone-acetaminophen (NORCO) 7.5-325 MG tablet Take 1-2 tablets by mouth every 4 (four) hours as needed for moderate pain or severe pain. 11/21/17   Danae Orleans, PA-C  lansoprazole (PREVACID) 15 MG capsule Take 15 mg by mouth every morning.     [provider]  latanoprost (XALATAN) 0.005 % ophthalmic solution Place 1 drop into both eyes at bedtime.  08/23/17   [provider]  metFORMIN (GLUCOPHAGE) 1000 MG tablet Take 1,000 mg by mouth 2 (two) times daily with  a meal.    [provider]  methocarbamol (ROBAXIN) 500 MG tablet Take 1 tablet (500 mg total) by mouth every 6 (six) hours as needed for muscle spasms. 11/21/17   Danae Orleans, PA-C  Multiple Vitamin (MULTIVITAMIN WITH MINERALS) TABS tablet Take 1 tablet by mouth daily.     [provider]  Omega-3 Fatty Acids (FISH OIL) 1000 MG CAPS Take 1,000 mg by mouth daily.     [provider]  Omega-3 Fatty Acids (FISH OIL) 1000 MG CAPS Take 1,000 mg by mouth daily.    [provider]  pioglitazone (ACTOS) 45 MG tablet Take 45 mg by mouth daily.  01/27/15  [provider]  polyethylene glycol (MIRALAX / GLYCOLAX) packet Take 17 g by mouth 2 (two) times daily. 11/21/17   Danae Orleans, PA-C  Probiotic Product (PROBIOTIC DAILY PO) Take 1 capsule by mouth daily.    [provider]  rosuvastatin (CRESTOR) 20 MG tablet Take 5 mg by mouth every evening.  11/08/16   [provider]  venlafaxine XR (EFFEXOR-XR) 75 MG 24 hr capsule Take 75 mg by mouth daily with breakfast.  12/16/14   [provider]    Allergies    Patient has no known allergies.  Review of Systems   Review of Systems  Cardiovascular: Positive for chest pain.  Gastrointestinal: Positive for nausea.  All other systems reviewed and are negative.   Physical Exam Updated Vital Signs BP (!) 152/84 (BP Location: Right Arm)   Pulse (!) 58   Temp 98.9 F (37.2 C) (Oral)   Resp 18   Ht 5\' 9"  (1.753 m)   Wt 127.5 kg   SpO2 92%   BMI 41.51 kg/m   Physical Exam Vitals and nursing note reviewed.  Constitutional:      Appearance: He is well-developed.  HENT:     Head: Normocephalic and atraumatic.  Eyes:     Conjunctiva/sclera: Conjunctivae normal.  Cardiovascular:     Rate and Rhythm: Normal rate and regular rhythm.     Heart sounds: Normal heart sounds. No murmur heard.   Pulmonary:     Effort: Pulmonary effort is normal. No respiratory distress.     Breath  sounds: Normal breath sounds.  Abdominal:     Palpations: Abdomen is soft.     Tenderness: There is no abdominal tenderness.  Musculoskeletal:        General: Normal range of motion.     Cervical back: Neck supple.  Skin:    General: Skin is warm and dry.  Neurological:     General: No focal deficit present.     Mental Status: He is alert.  Psychiatric:        Mood and Affect: Mood normal.     ED Results / Procedures / Treatments   Labs (all labs ordered are listed, but only abnormal results are displayed) Labs Reviewed  CBC - Abnormal; Notable for the following components:      Result Value   RDW 15.7 (*)    Platelets 419 (*)    All other components within normal limits  BASIC METABOLIC PANEL - Abnormal; Notable for the following components:   Glucose, Bld 165 (*)    BUN 28 (*)    All other components within normal limits  TROPONIN I (HIGH SENSITIVITY) - Abnormal; Notable for the following components:   Troponin I (High Sensitivity) 61 (*)    All other components within normal limits  TROPONIN I (HIGH SENSITIVITY) - Abnormal; Notable for the following components:   Troponin I (High Sensitivity) 71 (*)    All other components within normal limits  LIPASE, BLOOD    EKG EKG Interpretation  Date/Time:  Sunday February 07 2021 06:31:17 EDT Ventricular Rate:  80 PR Interval:  179 QRS Duration: 104 QT Interval:  398 QTC Calculation: 460 R Axis:   69 Text Interpretation: Sinus rhythm Minimal ST depression, lateral leads Baseline wander TECHNICALLY DIFFICULT Otherwise no significant change Confirmed by Deno Etienne [phone]) on 02/07/2021 6:37:13 AM   Radiology DG Chest 2 View  Result Date: 02/07/2021 CLINICAL DATA:  75 year old male with chest pain for 2-3 days exacerbated by lying  down. EXAM: CHEST - 2 VIEW COMPARISON:  Chest radiographs 09/11/2017 and earlier. FINDINGS: Lung volumes and mediastinal contours are stable and within normal limits, mild eventration of the right  hemidiaphragm (normal variant). Visualized tracheal air column is within normal limits. Stable lung markings since 2016. No pneumothorax, pulmonary edema, pleural effusion or confluent pulmonary opacity. No acute osseous abnormality identified. Paucity of bowel gas in the upper abdomen. IMPRESSION: No acute cardiopulmonary abnormality. Electronically Signed   By: Genevie Ann M.D.   On: 02/07/2021 07:27    Procedures Procedures   Medications Ordered in ED Medications  nitroGLYCERIN (NITROSTAT) SL tablet 0.4 mg (has no administration in time range)  alum & mag hydroxide-simeth (MAALOX/MYLANTA) 200-200-20 MG/5ML suspension 30 mL (30 mLs Oral Given 02/07/21 3329)  aspirin chewable tablet 324 mg (324 mg Oral Given 02/07/21 0856)    ED Course  I have reviewed the triage vital signs and the nursing notes.  Pertinent labs & imaging results that were available during my care of the patient were reviewed by me and considered in my medical decision making (see chart for details).  Clinical Course as of 02/07/21 1001  Sun Feb 07, 2021  0838 This is a 36 old male with a history of hypertension, obesity, hyperlipidemia, diabetes, presenting to emergency department with indigestion and chest pressure.  He reports he has been belching and feeling like he is having abdominal indigestion and bloating for the past several days.  He says this was worse when he was exercising 2 days ago.  He states last night in bed he began having a pressure that was substernal in the middle of his chest, which he has never had before.  The pressure has eased off this morning it is gone completely he thinks.  He continues to belch and feel indigestion.  He reports he is having regular bowel movements and had one yesterday.  No history of bowel obstructions.  He denies any history of coronary disease.  He has not had a cardiac work-up in several years.  He does not have a cardiologist.  He denies history of smoking.  His father died of an MI  at age 87.  On exam the patient is comfortable appearing, he is obese, he has no abdominal distention or rebound tenderness, regular heart rate with no murmur.  Labs show troponin of 61.  He will need a repeat troponin.  EKG on arrival shows no acute ischemic changes, no ST elevations to suggest MI at this time.  However he does have several risk factors that are concerning for coronary disease.  Aspirin ordered.  SL nitro if he develops recurrent chest pain [MT]  0916 Troponin I (High Sensitivity)(!): 71 [MT]  0957 Repeat ECG stable, no acute ischemic changes. [MT]    Clinical Course User Index [MT] Trifan, Carola Rhine, MD   MDM Rules/Calculators/A&P                          MDM:  Nonspecific ekg changes,  Troponin 61 and 71.  Pt currently pain free.   Heart Score 8  I spoke to Dr. Marlou Porch Cardiology who will see here.   He request medicine to admit.  Dr. Marylyn Ishihara here to see and examine  Final Clinical Impression(s) / ED Diagnoses Final diagnoses:  Nonspecific chest pain    Rx / DC Orders ED Discharge Orders    None       Fransico Meadow, [place] 02/07/21 1006  Wyvonnia Dusky, MD 02/07/21 1009

## 2021-02-07 NOTE — Plan of Care (Signed)
  Problem: Skin Integrity: Goal: Risk for impaired skin integrity will decrease Outcome: Completed/Met   Problem: Pain Managment: Goal: General experience of comfort will improve Outcome: Completed/Met   Problem: Coping: Goal: Level of anxiety will decrease Outcome: Completed/Met   Problem: Nutrition: Goal: Adequate nutrition will be maintained Outcome: Completed/Met   Problem: Activity: Goal: Risk for activity intolerance will decrease Outcome: Completed/Met

## 2021-02-08 ENCOUNTER — Inpatient Hospital Stay (HOSPITAL_COMMUNITY)
Admission: EM | Disposition: A | Discharge status: Home / Self Care | Payer: Self-pay | Source: Home / Self Care | Attending: Internal Medicine

## 2021-02-08 DIAGNOSIS — I10 Essential (primary) hypertension: Secondary | ICD-10-CM

## 2021-02-08 DIAGNOSIS — I200 Unstable angina: Secondary | ICD-10-CM

## 2021-02-08 DIAGNOSIS — E119 Type 2 diabetes mellitus without complications: Secondary | ICD-10-CM | POA: Diagnosis not present

## 2021-02-08 DIAGNOSIS — E782 Mixed hyperlipidemia: Secondary | ICD-10-CM

## 2021-02-08 DIAGNOSIS — Z6841 Body Mass Index (BMI) 40.0 and over, adult: Secondary | ICD-10-CM | POA: Diagnosis not present

## 2021-02-08 DIAGNOSIS — I25110 Atherosclerotic heart disease of native coronary artery with unstable angina pectoris: Principal | ICD-10-CM

## 2021-02-08 HISTORY — PX: LEFT HEART CATH AND CORONARY ANGIOGRAPHY: CATH118249

## 2021-02-08 HISTORY — PX: CORONARY BALLOON ANGIOPLASTY: CATH118233

## 2021-02-08 LAB — CBC
HCT: 43.2 % (ref 39.0–52.0)
Hemoglobin: 14.5 g/dL (ref 13.0–17.0)
MCH: 29.2 pg (ref 26.0–34.0)
MCHC: 33.6 g/dL (ref 30.0–36.0)
MCV: 86.9 fL (ref 80.0–100.0)
Platelets: 381 10*3/uL (ref 150–400)
RBC: 4.97 MIL/uL (ref 4.22–5.81)
RDW: 15.6 % — ABNORMAL HIGH (ref 11.5–15.5)
WBC: 6.8 10*3/uL (ref 4.0–10.5)
nRBC: 0 % (ref 0.0–0.2)

## 2021-02-08 LAB — GLUCOSE, CAPILLARY
Glucose-Capillary: 148 mg/dL — ABNORMAL HIGH (ref 70–99)
Glucose-Capillary: 166 mg/dL — ABNORMAL HIGH (ref 70–99)
Glucose-Capillary: 109 mg/dL — ABNORMAL HIGH (ref 70–99)
Glucose-Capillary: 244 mg/dL — ABNORMAL HIGH (ref 70–99)

## 2021-02-08 LAB — HEMOGLOBIN A1C
Hgb A1c MFr Bld: 7 % — ABNORMAL HIGH (ref 4.8–5.6)
Mean Plasma Glucose: 154.2 mg/dL

## 2021-02-08 LAB — LIPID PANEL
Cholesterol: 132 mg/dL (ref 0–200)
HDL: 39 mg/dL — ABNORMAL LOW (ref >40)
LDL Cholesterol: 62 mg/dL (ref 0–99)
Total CHOL/HDL Ratio: 3.4 RATIO
Triglycerides: 156 mg/dL — ABNORMAL HIGH (ref <150)
VLDL: 31 mg/dL (ref 0–40)

## 2021-02-08 LAB — HEPARIN LEVEL (UNFRACTIONATED)
Heparin Unfractionated: 0.15 IU/mL — ABNORMAL LOW (ref 0.30–0.70)
Heparin Unfractionated: 0.5 IU/mL (ref 0.30–0.70)

## 2021-02-08 LAB — POCT ACTIVATED CLOTTING TIME: Activated Clotting Time: 309 seconds

## 2021-02-08 SURGERY — LEFT HEART CATH AND CORONARY ANGIOGRAPHY
Anesthesia: LOCAL

## 2021-02-08 MED ORDER — TICAGRELOR 90 MG PO TABS
ORAL_TABLET | ORAL | Status: DC | PRN
  Administered 2021-02-08: 180 mg via ORAL

## 2021-02-08 MED ORDER — HEPARIN BOLUS VIA INFUSION
3000.0000 [IU] | Freq: Once | INTRAVENOUS | Status: AC
  Administered 2021-02-08: 3000 [IU] via INTRAVENOUS
  Filled 2021-02-08: qty 3000

## 2021-02-08 MED ORDER — SODIUM CHLORIDE 0.9 % IV SOLN: 250.0000 mL | INTRAVENOUS | Status: DC | PRN

## 2021-02-08 MED ORDER — HEPARIN SODIUM (PORCINE) 1000 UNIT/ML IJ SOLN
INTRAMUSCULAR | Status: DC | PRN
  Administered 2021-02-08: 3000 [IU] via INTRAVENOUS
  Administered 2021-02-08: 8000 [IU] via INTRAVENOUS

## 2021-02-08 MED ORDER — MIDAZOLAM HCL 2 MG/2ML IJ SOLN
INTRAMUSCULAR | Status: AC
  Filled 2021-02-08: qty 2

## 2021-02-08 MED ORDER — TICAGRELOR 90 MG PO TABS
ORAL_TABLET | ORAL | Status: AC
  Filled 2021-02-08: qty 1

## 2021-02-08 MED ORDER — NITROGLYCERIN 1 MG/10 ML FOR IR/CATH LAB
INTRA_ARTERIAL | Status: AC
  Filled 2021-02-08: qty 10

## 2021-02-08 MED ORDER — HEPARIN (PORCINE) IN NACL 1000-0.9 UT/500ML-% IV SOLN
INTRAVENOUS | Status: DC | PRN
  Administered 2021-02-08 (×2): 500 mL

## 2021-02-08 MED ORDER — SODIUM CHLORIDE 0.9% FLUSH
3.0000 mL | Freq: Two times a day (BID) | INTRAVENOUS | Status: DC
  Administered 2021-02-09: 3 mL via INTRAVENOUS

## 2021-02-08 MED ORDER — VERAPAMIL HCL 2.5 MG/ML IV SOLN
INTRAVENOUS | Status: DC | PRN
  Administered 2021-02-08: 10 mL via INTRA_ARTERIAL

## 2021-02-08 MED ORDER — HEPARIN SODIUM (PORCINE) 1000 UNIT/ML IJ SOLN
INTRAMUSCULAR | Status: AC
  Filled 2021-02-08: qty 1

## 2021-02-08 MED ORDER — FENTANYL CITRATE (PF) 100 MCG/2ML IJ SOLN
INTRAMUSCULAR | Status: DC | PRN
  Administered 2021-02-08 (×2): 25 ug via INTRAVENOUS

## 2021-02-08 MED ORDER — LIDOCAINE HCL (PF) 1 % IJ SOLN
INTRAMUSCULAR | Status: AC
  Filled 2021-02-08: qty 30

## 2021-02-08 MED ORDER — TICAGRELOR 90 MG PO TABS
90.0000 mg | ORAL_TABLET | Freq: Two times a day (BID) | ORAL | Status: DC
  Administered 2021-02-08 – 2021-02-09 (×2): 90 mg via ORAL
  Filled 2021-02-08 (×2): qty 1

## 2021-02-08 MED ORDER — SODIUM CHLORIDE 0.9% FLUSH: 3.0000 mL | INTRAVENOUS | Status: DC | PRN

## 2021-02-08 MED ORDER — LABETALOL HCL 5 MG/ML IV SOLN: 10.0000 mg | INTRAVENOUS | Status: AC | PRN

## 2021-02-08 MED ORDER — VERAPAMIL HCL 2.5 MG/ML IV SOLN
INTRAVENOUS | Status: AC
  Filled 2021-02-08: qty 2

## 2021-02-08 MED ORDER — MIDAZOLAM HCL 2 MG/2ML IJ SOLN
INTRAMUSCULAR | Status: DC | PRN
  Administered 2021-02-08 (×2): 2 mg via INTRAVENOUS

## 2021-02-08 MED ORDER — IOHEXOL 350 MG/ML SOLN
INTRAVENOUS | Status: DC | PRN
  Administered 2021-02-08: 110 mL

## 2021-02-08 MED ORDER — LIDOCAINE HCL (PF) 1 % IJ SOLN
INTRAMUSCULAR | Status: DC | PRN
  Administered 2021-02-08: 4 mL

## 2021-02-08 MED ORDER — SODIUM CHLORIDE 0.9 % IV SOLN: INTRAVENOUS | Status: AC

## 2021-02-08 MED ORDER — FENTANYL CITRATE (PF) 100 MCG/2ML IJ SOLN
INTRAMUSCULAR | Status: AC
  Filled 2021-02-08: qty 2

## 2021-02-08 MED ORDER — ASPIRIN 81 MG PO CHEW
81.0000 mg | CHEWABLE_TABLET | Freq: Every day | ORAL | Status: DC
  Administered 2021-02-09: 81 mg via ORAL
  Filled 2021-02-08: qty 1

## 2021-02-08 MED ORDER — NITROGLYCERIN 1 MG/10 ML FOR IR/CATH LAB
INTRA_ARTERIAL | Status: DC | PRN
  Administered 2021-02-08: 200 ug via INTRACORONARY

## 2021-02-08 MED ORDER — HEPARIN (PORCINE) IN NACL 1000-0.9 UT/500ML-% IV SOLN
INTRAVENOUS | Status: AC
  Filled 2021-02-08: qty 1500

## 2021-02-08 SURGICAL SUPPLY — 17 items
BALLN SAPPHIRE 2.5X15 (BALLOONS) ×2
BALLN SAPPHIRE ~~LOC~~ 3.75X12 (BALLOONS) ×2 IMPLANT
BALLOON SAPPHIRE 2.5X15 (BALLOONS) ×1 IMPLANT
CATH 5FR JL3.5 JR4 ANG PIG MP (CATHETERS) ×2 IMPLANT
CATH LAUNCHER 6FR EBU3.5 (CATHETERS) ×2 IMPLANT
DEVICE RAD COMP TR BAND LRG (VASCULAR PRODUCTS) ×2 IMPLANT
GLIDESHEATH SLEND SS 6F .021 (SHEATH) ×2 IMPLANT
GUIDEWIRE INQWIRE 1.5J.035X260 (WIRE) ×1 IMPLANT
INQWIRE 1.5J .035X260CM (WIRE) ×2
KIT ENCORE 26 ADVANTAGE (KITS) ×2 IMPLANT
KIT HEART LEFT (KITS) ×2 IMPLANT
PACK CARDIAC CATHETERIZATION (CUSTOM PROCEDURE TRAY) ×2 IMPLANT
STENT SYNERGY XD 3.50X16 (Permanent Stent) ×1 IMPLANT
SYNERGY XD 3.50X16 (Permanent Stent) ×2 IMPLANT
TRANSDUCER W/STOPCOCK (MISCELLANEOUS) ×2 IMPLANT
TUBING CIL FLEX 10 FLL-RA (TUBING) ×2 IMPLANT
WIRE COUGAR XT STRL 190CM (WIRE) ×2 IMPLANT

## 2021-02-08 NOTE — Progress Notes (Signed)
ANTICOAGULATION CONSULT NOTE - Initial Consult  Pharmacy Consult for Heparin Indication: chest pain/ACS  No Known Allergies  Patient Measurements: Height: 5\' 9"  (175.3 cm) Weight: 111.7 kg (246 lb 4.8 oz) IBW/kg (Calculated) : 70.7 Heparin Dosing Weight: 100 kg  Vital Signs: Temp: 98.4 F (36.9 C) (04/04 1154) Temp Source: Oral (04/04 1154) BP: 126/70 (04/04 1154) Pulse Rate: 72 (04/04 1154)  Labs: Recent Labs    02/07/21 0625 02/07/21 0820 02/07/21 1210 02/07/21 1544 02/08/21 0000 02/08/21 0129 02/08/21 1004  HGB 15.5  --   --   --   --  14.5  --   HCT 47.2  --   --   --   --  43.2  --   PLT 419*  --   --   --   --  381  --   APTT  --   --   --  32  --   --   --   LABPROT  --   --   --  13.7  --   --   --   INR  --   --   --  1.1  --   --   --   HEPARINUNFRC  --   --   --   --  0.15*  --  0.50  CREATININE  --  1.00  --   --   --   --   --   TROPONINIHS 61* 71* 88* 81*  --   --   --     Estimated Creatinine Clearance: 77.4 mL/min (by C-G formula based on SCr of 1 mg/dL).   Medical History: Past Medical History:  Diagnosis Date  . Arthritis   . Cancer [facility])    prostate cancer , basal cell skin cancer removed from nose 10/2014   . Cancer [facility])    prostate cancer  . Cancer ([place])    melanoma on nose  . Complication of anesthesia   . Diabetes mellitus without complication ([place])    TYPE 2   . Environmental allergies   . Glaucoma   . Hyperlipidemia   . Hypertension   . PND (post-nasal drip)    SYMPTOMS OF COLD ONSET 2 WEEKS AGO ; HAS HAD DRY COUGH ,MENTIONS LESS COUGHING TODAY NO EXPETORANT ; AFEBRILE ; USING NASAL WASHES FOR PND   . Sinus problem   . Sleep apnea    cpap- setting at 10   . Sleep apnea      Assessment: 75 yo male with multiple cardiac risk factors who presents with chest pain.No anticoagulation prior to admission. Pharmacy consulted for heparin.    HL 0.5 on 1600 units/hr and 3000 unit re-bolus. H/H, plt stable. Heart cath today.     Goal of Therapy:  Heparin level 0.3-0.7 units/ml Monitor platelets by anticoagulation protocol: Yes   Plan:  Continue heparin 1600 units/hr IV infusion Check heparin level in 8hrs Daily heparin level and CBC F/u heart cath results   Benetta Spar, PharmD, BCPS, [place] Clinical Pharmacist  Please check AMION for all [place] phone numbers After 10:00 PM, call [place] [phone]

## 2021-02-08 NOTE — Interval H&P Note (Signed)
Cath Lab Visit (complete for each Cath Lab visit)  Clinical Evaluation Leading to the Procedure:   ACS: Yes.    Non-ACS:    Anginal Classification: CCS IV  Anti-ischemic medical therapy: Maximal Therapy (2 or more classes of medications)  Non-Invasive Test Results: No non-invasive testing performed  Prior CABG: No previous CABG      History and Physical Interval Note:  02/08/2021 2:44 PM  Justin Cohen  has presented today for surgery, with the diagnosis of unstable angina.  The various methods of treatment have been discussed with the patient and family. After consideration of risks, benefits and other options for treatment, the patient has consented to  Procedure(s): LEFT HEART CATH AND CORONARY ANGIOGRAPHY (N/A) as a surgical intervention.  The patient's history has been reviewed, patient examined, no change in status, stable for surgery.  I have reviewed the patient's chart and labs.  Questions were answered to the patient's satisfaction.     Sherren Mocha

## 2021-02-08 NOTE — H&P (View-Only) (Signed)
Progress Note  Patient Name: Justin Cohen Date of Encounter: 02/08/2021  [facility] Cardiologist: Marlou Porch   Subjective   75 year old gentleman with a history of diabetes, hypertension, hyperlipidemia, morbid obesity.  He was admitted with chest pain and very minimally elevated troponin levels.  He has mildly reduced left ventricular systolic function with an EF of 45%.  He has akinesis of the mid/distal anteroseptal wall.  Is scheduled for heart catheterization today.  Inpatient Medications    Scheduled Meds: . amLODipine  10 mg Oral Daily   And  . benazepril  20 mg Oral Daily  . cholecalciferol  2,000 Units Oral Daily  . fluticasone  2 spray Each Nare Daily  . insulin aspart  0-15 Units Subcutaneous TID WC  . insulin aspart  0-5 Units Subcutaneous QHS  . latanoprost  1 drop Both Eyes QHS  . loratadine  10 mg Oral Daily  . multivitamin with minerals  1 tablet Oral Daily  . omega-3 acid ethyl esters  1 g Oral Daily  . pantoprazole  40 mg Oral Daily  . rosuvastatin  5 mg Oral QPM  . simethicone  80 mg Oral QID  . sodium chloride flush  3 mL Intravenous Q12H  . venlafaxine XR  75 mg Oral Q breakfast   Continuous Infusions: . sodium chloride    . sodium chloride 1 mL/kg/hr (02/08/21 0523)  . heparin 1,600 Units/hr (02/08/21 0315)   PRN Meds: sodium chloride, acetaminophen, hydrALAZINE, nitroGLYCERIN, ondansetron (ZOFRAN) IV, sodium chloride flush   Vital Signs    Vitals:   02/07/21 2337 02/08/21 0031 02/08/21 0321 02/08/21 0818  BP: 129/76  121/70 137/74  Pulse: 75  70 73  Resp: 20  19   Temp: 98.2 F (36.8 C)  97.6 F (36.4 C) 97.9 F (36.6 C)  TempSrc: Oral  Oral Oral  SpO2: 96%  95% 97%  Weight:  111.7 kg    Height:        Intake/Output Summary (Last 24 hours) at 02/08/2021 0855 Last data filed at 02/08/2021 0843 Gross per 24 hour  Intake 820.46 ml  Output 2100 ml  Net -1279.54 ml   Last 3 Weights 02/08/2021 02/07/2021 02/07/2021  Weight (lbs) 246 lb  4.8 oz 246 lb 9.6 oz 281 lb 1.4 oz  Weight (kg) 111.721 kg 111.857 kg 127.5 kg      Telemetry    NSR  - Personally Reviewed  ECG    NSR  - Personally Reviewed  Physical Exam   GEN: No acute distress.  Morbidly obese  Neck: No JVD Cardiac: RRR, no murmurs, rubs, or gallops.  Respiratory: Clear to auscultation bilaterally. GI: Soft, nontender, non-distended  MS: No edema; No deformity. Neuro:  Nonfocal  Psych: Normal affect   Labs    High Sensitivity Troponin:   Recent Labs  Lab 02/07/21 0625 02/07/21 0820 02/07/21 1210 02/07/21 1544  TROPONINIHS 61* 71* 88* 81*      Chemistry Recent Labs  Lab 02/07/21 0820  NA 138  K 4.9  CL 105  CO2 27  GLUCOSE 165*  BUN 28*  CREATININE 1.00  CALCIUM 9.7  GFRNONAA >60  ANIONGAP 6     Hematology Recent Labs  Lab 02/07/21 0625 02/08/21 0129  WBC 6.7 6.8  RBC 5.43 4.97  HGB 15.5 14.5  HCT 47.2 43.2  MCV 86.9 86.9  MCH 28.5 29.2  MCHC 32.8 33.6  RDW 15.7* 15.6*  PLT 419* 381    BNP Recent Labs  Lab  02/07/21 1210  BNP 35.1     DDimer No results for input(s): DDIMER in the last 168 hours.   Radiology    DG Chest 2 View  Result Date: 02/07/2021 CLINICAL DATA:  75 year old male with chest pain for 2-3 days exacerbated by lying down. EXAM: CHEST - 2 VIEW COMPARISON:  Chest radiographs 09/11/2017 and earlier. FINDINGS: Lung volumes and mediastinal contours are stable and within normal limits, mild eventration of the right hemidiaphragm (normal variant). Visualized tracheal air column is within normal limits. Stable lung markings since 2016. No pneumothorax, pulmonary edema, pleural effusion or confluent pulmonary opacity. No acute osseous abnormality identified. Paucity of bowel gas in the upper abdomen. IMPRESSION: No acute cardiopulmonary abnormality. Electronically Signed   By: Genevie Ann M.D.   On: 02/07/2021 07:27   ECHOCARDIOGRAM COMPLETE  Result Date: 02/07/2021    ECHOCARDIOGRAM REPORT   Patient Name:    Justin Cohen Date of Exam: 02/07/2021 Medical Rec #:  [id number]         Height:       69.0 in Accession #:    [id number]        Weight:       281.1 lb Date of Birth:  1946/10/20          BSA:          2.388 m Patient Age:    66 years          BP:           148/78 mmHg Patient Gender: M                 HR:           55 bpm. Exam Location:  Inpatient Procedure: 2D Echo, Cardiac Doppler and Color Doppler Indications:    R07.9* Chest pain, unspecified  History:        Patient has no prior history of Echocardiogram examinations.                 Risk Factors:Hypertension, Diabetes, Dyslipidemia and Sleep                 Apnea. COVID-19. Cancer.  Sonographer:    Tiffany Dance Referring Phys: [id number] [place]  1. Left ventricular ejection fraction, by estimation, is 45 to 50%. The left ventricle has mildly decreased function. The left ventricle demonstrates regional wall motion abnormalities (see scoring diagram/findings for description). There is mild left ventricular hypertrophy of the basal segment. Left ventricular diastolic parameters are consistent with Grade I diastolic dysfunction (impaired relaxation).  2. Right ventricular systolic function is normal. The right ventricular size is normal.  3. The mitral valve is normal in structure. No evidence of mitral valve regurgitation. No evidence of mitral stenosis.  4. The aortic valve is tricuspid. There is mild calcification of the aortic valve. There is mild thickening of the aortic valve. Aortic valve regurgitation is not visualized. Mild aortic valve sclerosis is present, with no evidence of aortic valve stenosis.  5. The inferior vena cava is normal in size with greater than 50% respiratory variability, suggesting right atrial pressure of 3 mmHg. FINDINGS  Left Ventricle: Left ventricular ejection fraction, by estimation, is 45 to 50%. The left ventricle has mildly decreased function. The left ventricle demonstrates regional wall motion  abnormalities. The left ventricular internal cavity size was normal in size. There is mild left ventricular hypertrophy of the basal segment. Left ventricular diastolic parameters are consistent with Grade I diastolic  dysfunction (impaired relaxation).  LV Wall Scoring: The mid anteroseptal segment and apical inferior segment are akinetic. The mid inferoseptal segment and mid inferior segment are hypokinetic. Right Ventricle: The right ventricular size is normal. No increase in right ventricular wall thickness. Right ventricular systolic function is normal. Left Atrium: Left atrial size was normal in size. Right Atrium: Right atrial size was normal in size. Pericardium: There is no evidence of pericardial effusion. Mitral Valve: The mitral valve is normal in structure. No evidence of mitral valve regurgitation. No evidence of mitral valve stenosis. Tricuspid Valve: The tricuspid valve is normal in structure. Tricuspid valve regurgitation is not demonstrated. No evidence of tricuspid stenosis. Aortic Valve: The aortic valve is tricuspid. There is mild calcification of the aortic valve. There is mild thickening of the aortic valve. Aortic valve regurgitation is not visualized. Mild aortic valve sclerosis is present, with no evidence of aortic valve stenosis. Pulmonic Valve: The pulmonic valve was normal in structure. Pulmonic valve regurgitation is not visualized. No evidence of pulmonic stenosis. Aorta: The aortic root is normal in size and structure. Venous: The inferior vena cava is normal in size with greater than 50% respiratory variability, suggesting right atrial pressure of 3 mmHg. IAS/Shunts: No atrial level shunt detected by color flow Doppler.  LEFT VENTRICLE PLAX 2D LVIDd:         4.40 cm  Diastology LVIDs:         2.90 cm  LV e' medial:    5.22 cm/s LV PW:         0.90 cm  LV E/e' medial:  10.8 LV IVS:        1.30 cm  LV e' lateral:   6.74 cm/s LVOT diam:     2.10 cm  LV E/e' lateral: 8.4 LV SV:          79 LV SV Index:   33 LVOT Area:     3.46 cm  RIGHT VENTRICLE            IVC RV Basal diam:  2.90 cm    IVC diam: 1.70 cm RV S prime:     7.94 cm/s TAPSE (M-mode): 1.5 cm LEFT ATRIUM             Index       RIGHT ATRIUM           Index LA diam:        3.80 cm 1.59 cm/m  RA Area:     14.70 cm LA Vol (A2C):   64.7 ml 27.10 ml/m RA Volume:   36.50 ml  15.29 ml/m LA Vol (A4C):   41.9 ml 17.55 ml/m LA Biplane Vol: 53.7 ml 22.49 ml/m  AORTIC VALVE LVOT Vmax:   101.00 cm/s LVOT Vmean:  64.300 cm/s LVOT VTI:    0.228 m  AORTA Ao Root diam: 3.30 cm Ao Asc diam:  3.50 cm MITRAL VALVE MV Area (PHT): 2.83 cm    SHUNTS MV Decel Time: 268 msec    Systemic VTI:  0.23 m MV E velocity: 56.60 cm/s  Systemic Diam: 2.10 cm MV A velocity: 75.80 cm/s MV E/A ratio:  0.75 Candee Furbish MD Electronically signed by Candee Furbish MD Signature Date/Time: 02/07/2021/4:13:19 PM    Final     Cardiac Studies      Patient Profile     75 y.o. male admitted with indigestion / chest pain   Assessment & Plan    1.  Unstable angina: The patient has  multiple cardiac risk factors including hypertension, hyperlipidemia, diabetes mellitus, obesity.  Enzymes are mildly elevated.  His chest pains are somewhat atypical but he does have [place] segmental wall motion abnormality that is suggestive of an LAD stenosis.  We discussed the risk, benefits, options regarding heart catheterization.  He understands and agrees to proceed.  2.  Hypertension: Continue current medications.  3.  Hyperlipidemia: Lipid levels look fairly well controlled.  His LDL is 62.  His triglyceride level is mildly elevated at 156.  HDL is 39. Continue rosuvastatin 5 mg a day.       For questions or updates, please contact [place] Please consult [email or web address] for contact info under        Signed, Mertie Moores, MD  02/08/2021, 8:55 AM

## 2021-02-08 NOTE — Progress Notes (Signed)
PROGRESS NOTE    Justin Cohen  KYH:[id number] DOB: [birth year] DOA: 02/07/2021 PCP: Antony Contras, MD    Brief Narrative:  75 year old gentleman with history of prostate cancer, type 2 diabetes, hypertension, hyperlipidemia presented to the hospital with chest pressure sensation, indigestion syndrome for about 2 days.  Tried different antacids at home with no help so came to the ER.  In the emergency room, mildly elevated troponins.  EKG with no acute ischemia but ST depression.  Chest pain-free since admission.  Admitted with unstable angina with cardiac cath plans.   Assessment & Plan:   Active Problems:   Chest pain due to CAD [facility])   Unstable angina ([place])  Unstable angina, non-STEMI: Currently hemodynamically stable. Echocardiogram with inferior wall motion abnormalities. Received aspirin in the ER.  Currently remains on heparin infusion with symptom control. Going for cardiac cath today, will depend on findings.  Followed by cardiology.  Type 2 diabetes: Well controlled.  On Metformin at home.  Currently remains on sliding scale insulin.  Metformin and Actos on hold.  Will resume on discharge.  Essential hypertension: Blood pressure stable on Lotrel.  Continue.  Hyperlipidemia: On Crestor continued.  GERD: Currently on lansoprazole.  If cardiac cath negative, will increase dose of PPI for 1 month.   DVT prophylaxis: Heparin infusion.   Code Status: Full code Family Communication: Wife at the bedside Disposition Plan: Status is: Inpatient  Remains inpatient appropriate because:Inpatient level of care appropriate due to severity of illness   Dispo: The patient is from: Home              Anticipated d/c is to: Home              Patient currently is not medically stable to d/c.   Difficult to place patient No         Consultants:   Cardiology  Procedures:   None.  Scheduled for cardiac cath.  Antimicrobials:   None   Subjective: Patient seen and  examined.  Wife at the bedside.  No overnight events.  No more chest discomfort since admission.  He is looking forward for cardiac cath.  Objective: Vitals:   02/07/21 2337 02/08/21 0031 02/08/21 0321 02/08/21 0818  BP: 129/76  121/70 137/74  Pulse: 75  70 73  Resp: 20  19   Temp: 98.2 F (36.8 C)  97.6 F (36.4 C) 97.9 F (36.6 C)  TempSrc: Oral  Oral Oral  SpO2: 96%  95% 97%  Weight:  111.7 kg    Height:        Intake/Output Summary (Last 24 hours) at 02/08/2021 1109 Last data filed at 02/08/2021 1052 Gross per 24 hour  Intake 820.46 ml  Output 2380 ml  Net -1559.54 ml   Filed Weights   02/07/21 0645 02/07/21 1709 02/08/21 0031  Weight: 127.5 kg 111.9 kg 111.7 kg    Examination:  General exam: Appears calm and comfortable, on room air. Respiratory system: Clear to auscultation. Respiratory effort normal. Cardiovascular system: S1 & S2 heard, RRR. No JVD, murmurs, rubs, gallops or clicks. No pedal edema. Gastrointestinal system: Abdomen is nondistended, soft and nontender. No organomegaly or masses felt. Normal bowel sounds heard. Central nervous system: Alert and oriented. No focal neurological deficits. Extremities: Symmetric 5 x 5 power. Skin: No rashes, lesions or ulcers Psychiatry: Judgement and insight appear normal. Mood & affect appropriate.     Data Reviewed: I have personally reviewed following labs and imaging studies  CBC: Recent Labs  Lab 02/07/21 0625 02/08/21 0129  WBC 6.7 6.8  HGB 15.5 14.5  HCT 47.2 43.2  MCV 86.9 86.9  PLT 419* 240   Basic Metabolic Panel: Recent Labs  Lab 02/07/21 0820  NA 138  K 4.9  CL 105  CO2 27  GLUCOSE 165*  BUN 28*  CREATININE 1.00  CALCIUM 9.7   GFR: Estimated Creatinine Clearance: 77.4 mL/min (by C-G formula based on SCr of 1 mg/dL). Liver Function Tests: No results for input(s): AST, ALT, ALKPHOS, BILITOT, PROT, ALBUMIN in the last 168 hours. Recent Labs  Lab 02/07/21 0626  LIPASE 29   No results  for input(s): AMMONIA in the last 168 hours. Coagulation Profile: Recent Labs  Lab 02/07/21 1544  INR 1.1   Cardiac Enzymes: No results for input(s): CKTOTAL, CKMB, CKMBINDEX, TROPONINI in the last 168 hours. BNP (last 3 results) No results for input(s): PROBNP in the last 8760 hours. HbA1C: Recent Labs    02/08/21 0129  HGBA1C 7.0*   CBG: Recent Labs  Lab 02/07/21 1728 02/07/21 2059 02/08/21 0556  GLUCAP 177* 175* 148*   Lipid Profile: Recent Labs    02/08/21 0129  CHOL 132  HDL 39*  LDLCALC 62  TRIG 156*  CHOLHDL 3.4   Thyroid Function Tests: No results for input(s): TSH, T4TOTAL, FREET4, T3FREE, THYROIDAB in the last 72 hours. Anemia Panel: No results for input(s): VITAMINB12, FOLATE, FERRITIN, TIBC, IRON, RETICCTPCT in the last 72 hours. Sepsis Labs: No results for input(s): PROCALCITON, LATICACIDVEN in the last 168 hours.  Recent Results (from the past 240 hour(s))  Resp Panel by RT-PCR (Flu A&B, Covid) Nasopharyngeal Swab     Status: None   Collection Time: 02/07/21 10:08 AM   Specimen: Nasopharyngeal Swab; Nasopharyngeal(NP) swabs in vial transport medium  Result Value Ref Range Status   SARS Coronavirus 2 by RT PCR NEGATIVE NEGATIVE Final    Comment: (NOTE) SARS-CoV-2 target nucleic acids are NOT DETECTED.  The SARS-CoV-2 RNA is generally detectable in upper respiratory specimens during the acute phase of infection. The lowest concentration of SARS-CoV-2 viral copies this assay can detect is 138 copies/mL. A negative result does not preclude SARS-Cov-2 infection and should not be used as the sole basis for treatment or other patient management decisions. A negative result may occur with  improper specimen collection/handling, submission of specimen other than nasopharyngeal swab, presence of viral mutation(s) within the areas targeted by this assay, and inadequate number of viral copies(<138 copies/mL). A negative result must be combined  with clinical observations, patient history, and epidemiological information. The expected result is Negative.  Fact Sheet for Patients:  EntrepreneurPulse.com.au  Fact Sheet for Healthcare Providers:  IncredibleEmployment.be  This test is no t yet approved or cleared by the [place] FDA and  has been authorized for detection and/or diagnosis of SARS-CoV-2 by FDA under an Emergency Use Authorization (EUA). This EUA will remain  in effect (meaning this test can be used) for the duration of the COVID-19 declaration under Section 564(b)(1) of the Act, 21 U.S.C.section 360bbb-3(b)(1), unless the authorization is terminated  or revoked sooner.       Influenza A by PCR NEGATIVE NEGATIVE Final   Influenza B by PCR NEGATIVE NEGATIVE Final    Comment: (NOTE) The Xpert Xpress SARS-CoV-2/FLU/RSV plus assay is intended as an aid in the diagnosis of influenza from Nasopharyngeal swab specimens and should not be used as a sole basis for treatment. Nasal washings and aspirates are unacceptable for Xpert Xpress SARS-CoV-2/FLU/RSV testing.  Fact Sheet for Patients: EntrepreneurPulse.com.au  Fact Sheet for Healthcare Providers: IncredibleEmployment.be  This test is not yet approved or cleared by the [place] FDA and has been authorized for detection and/or diagnosis of SARS-CoV-2 by FDA under an Emergency Use Authorization (EUA). This EUA will remain in effect (meaning this test can be used) for the duration of the COVID-19 declaration under Section 564(b)(1) of the Act, 21 U.S.C. section 360bbb-3(b)(1), unless the authorization is terminated or revoked.  Performed at [facility], [place] [address], [place], [place] [address]          Radiology Studies: DG Chest 2 View  Result Date: 02/07/2021 CLINICAL DATA:  74 year old male with chest pain for 2-3 days exacerbated by lying down.  EXAM: CHEST - 2 VIEW COMPARISON:  Chest radiographs 09/11/2017 and earlier. FINDINGS: Lung volumes and mediastinal contours are stable and within normal limits, mild eventration of the right hemidiaphragm (normal variant). Visualized tracheal air column is within normal limits. Stable lung markings since 2016. No pneumothorax, pulmonary edema, pleural effusion or confluent pulmonary opacity. No acute osseous abnormality identified. Paucity of bowel gas in the upper abdomen. IMPRESSION: No acute cardiopulmonary abnormality. Electronically Signed   By: Genevie Ann M.D.   On: 02/07/2021 07:27   ECHOCARDIOGRAM COMPLETE  Result Date: 02/07/2021    ECHOCARDIOGRAM REPORT   Patient Name:   YUAN GANN Date of Exam: 02/07/2021 Medical Rec #:  [id number]         Height:       69.0 in Accession #:    [id number]        Weight:       281.1 lb Date of Birth:  Feb 13, 1946          BSA:          2.388 m Patient Age:    8 years          BP:           148/78 mmHg Patient Gender: M                 HR:           55 bpm. Exam Location:  Inpatient Procedure: 2D Echo, Cardiac Doppler and Color Doppler Indications:    R07.9* Chest pain, unspecified  History:        Patient has no prior history of Echocardiogram examinations.                 Risk Factors:Hypertension, Diabetes, Dyslipidemia and Sleep                 Apnea. COVID-19. Cancer.  Sonographer:    Tiffany Dance Referring Phys: [id number] [place]  1. Left ventricular ejection fraction, by estimation, is 45 to 50%. The left ventricle has mildly decreased function. The left ventricle demonstrates regional wall motion abnormalities (see scoring diagram/findings for description). There is mild left ventricular hypertrophy of the basal segment. Left ventricular diastolic parameters are consistent with Grade I diastolic dysfunction (impaired relaxation).  2. Right ventricular systolic function is normal. The right ventricular size is normal.  3. The mitral valve is normal  in structure. No evidence of mitral valve regurgitation. No evidence of mitral stenosis.  4. The aortic valve is tricuspid. There is mild calcification of the aortic valve. There is mild thickening of the aortic valve. Aortic valve regurgitation is not visualized. Mild aortic valve sclerosis is present, with no evidence of aortic valve stenosis.  5. The  inferior vena cava is normal in size with greater than 50% respiratory variability, suggesting right atrial pressure of 3 mmHg. FINDINGS  Left Ventricle: Left ventricular ejection fraction, by estimation, is 45 to 50%. The left ventricle has mildly decreased function. The left ventricle demonstrates regional wall motion abnormalities. The left ventricular internal cavity size was normal in size. There is mild left ventricular hypertrophy of the basal segment. Left ventricular diastolic parameters are consistent with Grade I diastolic dysfunction (impaired relaxation).  LV Wall Scoring: The mid anteroseptal segment and apical inferior segment are akinetic. The mid inferoseptal segment and mid inferior segment are hypokinetic. Right Ventricle: The right ventricular size is normal. No increase in right ventricular wall thickness. Right ventricular systolic function is normal. Left Atrium: Left atrial size was normal in size. Right Atrium: Right atrial size was normal in size. Pericardium: There is no evidence of pericardial effusion. Mitral Valve: The mitral valve is normal in structure. No evidence of mitral valve regurgitation. No evidence of mitral valve stenosis. Tricuspid Valve: The tricuspid valve is normal in structure. Tricuspid valve regurgitation is not demonstrated. No evidence of tricuspid stenosis. Aortic Valve: The aortic valve is tricuspid. There is mild calcification of the aortic valve. There is mild thickening of the aortic valve. Aortic valve regurgitation is not visualized. Mild aortic valve sclerosis is present, with no evidence of aortic valve  stenosis. Pulmonic Valve: The pulmonic valve was normal in structure. Pulmonic valve regurgitation is not visualized. No evidence of pulmonic stenosis. Aorta: The aortic root is normal in size and structure. Venous: The inferior vena cava is normal in size with greater than 50% respiratory variability, suggesting right atrial pressure of 3 mmHg. IAS/Shunts: No atrial level shunt detected by color flow Doppler.  LEFT VENTRICLE PLAX 2D LVIDd:         4.40 cm  Diastology LVIDs:         2.90 cm  LV e' medial:    5.22 cm/s LV PW:         0.90 cm  LV E/e' medial:  10.8 LV IVS:        1.30 cm  LV e' lateral:   6.74 cm/s LVOT diam:     2.10 cm  LV E/e' lateral: 8.4 LV SV:         79 LV SV Index:   33 LVOT Area:     3.46 cm  RIGHT VENTRICLE            IVC RV Basal diam:  2.90 cm    IVC diam: 1.70 cm RV S prime:     7.94 cm/s TAPSE (M-mode): 1.5 cm LEFT ATRIUM             Index       RIGHT ATRIUM           Index LA diam:        3.80 cm 1.59 cm/m  RA Area:     14.70 cm LA Vol (A2C):   64.7 ml 27.10 ml/m RA Volume:   36.50 ml  15.29 ml/m LA Vol (A4C):   41.9 ml 17.55 ml/m LA Biplane Vol: 53.7 ml 22.49 ml/m  AORTIC VALVE LVOT Vmax:   101.00 cm/s LVOT Vmean:  64.300 cm/s LVOT VTI:    0.228 m  AORTA Ao Root diam: 3.30 cm Ao Asc diam:  3.50 cm MITRAL VALVE MV Area (PHT): 2.83 cm    SHUNTS MV Decel Time: 268 msec    Systemic VTI:  0.23  m MV E velocity: 56.60 cm/s  Systemic Diam: 2.10 cm MV A velocity: 75.80 cm/s MV E/A ratio:  0.75 Candee Furbish MD Electronically signed by Candee Furbish MD Signature Date/Time: 02/07/2021/4:13:19 PM    Final         Scheduled Meds: . amLODipine  10 mg Oral Daily   And  . benazepril  20 mg Oral Daily  . cholecalciferol  2,000 Units Oral Daily  . fluticasone  2 spray Each Nare Daily  . insulin aspart  0-15 Units Subcutaneous TID WC  . insulin aspart  0-5 Units Subcutaneous QHS  . latanoprost  1 drop Both Eyes QHS  . loratadine  10 mg Oral Daily  . multivitamin with minerals  1 tablet  Oral Daily  . omega-3 acid ethyl esters  1 g Oral Daily  . pantoprazole  40 mg Oral Daily  . rosuvastatin  5 mg Oral QPM  . simethicone  80 mg Oral QID  . sodium chloride flush  3 mL Intravenous Q12H  . venlafaxine XR  75 mg Oral Q breakfast   Continuous Infusions: . sodium chloride    . sodium chloride 1 mL/kg/hr (02/08/21 1052)  . heparin 1,600 Units/hr (02/08/21 0315)     LOS: 1 day    Time spent: 35 minutes    Barb Merino, MD [facility] Pager [phone]

## 2021-02-08 NOTE — Progress Notes (Signed)
Progress Note  Patient Name: Justin Cohen Date of Encounter: 02/08/2021  [facility] Cardiologist: Marlou Porch   Subjective   75 year old gentleman with a history of diabetes, hypertension, hyperlipidemia, morbid obesity.  He was admitted with chest pain and very minimally elevated troponin levels.  He has mildly reduced left ventricular systolic function with an EF of 45%.  He has akinesis of the mid/distal anteroseptal wall.  Is scheduled for heart catheterization today.  Inpatient Medications    Scheduled Meds: . amLODipine  10 mg Oral Daily   And  . benazepril  20 mg Oral Daily  . cholecalciferol  2,000 Units Oral Daily  . fluticasone  2 spray Each Nare Daily  . insulin aspart  0-15 Units Subcutaneous TID WC  . insulin aspart  0-5 Units Subcutaneous QHS  . latanoprost  1 drop Both Eyes QHS  . loratadine  10 mg Oral Daily  . multivitamin with minerals  1 tablet Oral Daily  . omega-3 acid ethyl esters  1 g Oral Daily  . pantoprazole  40 mg Oral Daily  . rosuvastatin  5 mg Oral QPM  . simethicone  80 mg Oral QID  . sodium chloride flush  3 mL Intravenous Q12H  . venlafaxine XR  75 mg Oral Q breakfast   Continuous Infusions: . sodium chloride    . sodium chloride 1 mL/kg/hr (02/08/21 0523)  . heparin 1,600 Units/hr (02/08/21 0315)   PRN Meds: sodium chloride, acetaminophen, hydrALAZINE, nitroGLYCERIN, ondansetron (ZOFRAN) IV, sodium chloride flush   Vital Signs    Vitals:   02/07/21 2337 02/08/21 0031 02/08/21 0321 02/08/21 0818  BP: 129/76  121/70 137/74  Pulse: 75  70 73  Resp: 20  19   Temp: 98.2 F (36.8 C)  97.6 F (36.4 C) 97.9 F (36.6 C)  TempSrc: Oral  Oral Oral  SpO2: 96%  95% 97%  Weight:  111.7 kg    Height:        Intake/Output Summary (Last 24 hours) at 02/08/2021 0855 Last data filed at 02/08/2021 0843 Gross per 24 hour  Intake 820.46 ml  Output 2100 ml  Net -1279.54 ml   Last 3 Weights 02/08/2021 02/07/2021 02/07/2021  Weight (lbs) 246 lb  4.8 oz 246 lb 9.6 oz 281 lb 1.4 oz  Weight (kg) 111.721 kg 111.857 kg 127.5 kg      Telemetry    NSR  - Personally Reviewed  ECG    NSR  - Personally Reviewed  Physical Exam   GEN: No acute distress.  Morbidly obese  Neck: No JVD Cardiac: RRR, no murmurs, rubs, or gallops.  Respiratory: Clear to auscultation bilaterally. GI: Soft, nontender, non-distended  MS: No edema; No deformity. Neuro:  Nonfocal  Psych: Normal affect   Labs    High Sensitivity Troponin:   Recent Labs  Lab 02/07/21 0625 02/07/21 0820 02/07/21 1210 02/07/21 1544  TROPONINIHS 61* 71* 88* 81*      Chemistry Recent Labs  Lab 02/07/21 0820  NA 138  K 4.9  CL 105  CO2 27  GLUCOSE 165*  BUN 28*  CREATININE 1.00  CALCIUM 9.7  GFRNONAA >60  ANIONGAP 6     Hematology Recent Labs  Lab 02/07/21 0625 02/08/21 0129  WBC 6.7 6.8  RBC 5.43 4.97  HGB 15.5 14.5  HCT 47.2 43.2  MCV 86.9 86.9  MCH 28.5 29.2  MCHC 32.8 33.6  RDW 15.7* 15.6*  PLT 419* 381    BNP Recent Labs  Lab  02/07/21 1210  BNP 35.1     DDimer No results for input(s): DDIMER in the last 168 hours.   Radiology    DG Chest 2 View  Result Date: 02/07/2021 CLINICAL DATA:  74 year old male with chest pain for 2-3 days exacerbated by lying down. EXAM: CHEST - 2 VIEW COMPARISON:  Chest radiographs 09/11/2017 and earlier. FINDINGS: Lung volumes and mediastinal contours are stable and within normal limits, mild eventration of the right hemidiaphragm (normal variant). Visualized tracheal air column is within normal limits. Stable lung markings since 2016. No pneumothorax, pulmonary edema, pleural effusion or confluent pulmonary opacity. No acute osseous abnormality identified. Paucity of bowel gas in the upper abdomen. IMPRESSION: No acute cardiopulmonary abnormality. Electronically Signed   By: Genevie Ann M.D.   On: 02/07/2021 07:27   ECHOCARDIOGRAM COMPLETE  Result Date: 02/07/2021    ECHOCARDIOGRAM REPORT   Patient Name:    Justin Cohen Date of Exam: 02/07/2021 Medical Rec #:  [id number]         Height:       69.0 in Accession #:    [id number]        Weight:       281.1 lb Date of Birth:  [birth year]          BSA:          2.388 m Patient Age:    64 years          BP:           148/78 mmHg Patient Gender: M                 HR:           55 bpm. Exam Location:  Inpatient Procedure: 2D Echo, Cardiac Doppler and Color Doppler Indications:    R07.9* Chest pain, unspecified  History:        Patient has no prior history of Echocardiogram examinations.                 Risk Factors:Hypertension, Diabetes, Dyslipidemia and Sleep                 Apnea. COVID-19. Cancer.  Sonographer:    Tiffany Dance Referring Phys: [id number] [place]  1. Left ventricular ejection fraction, by estimation, is 45 to 50%. The left ventricle has mildly decreased function. The left ventricle demonstrates regional wall motion abnormalities (see scoring diagram/findings for description). There is mild left ventricular hypertrophy of the basal segment. Left ventricular diastolic parameters are consistent with Grade I diastolic dysfunction (impaired relaxation).  2. Right ventricular systolic function is normal. The right ventricular size is normal.  3. The mitral valve is normal in structure. No evidence of mitral valve regurgitation. No evidence of mitral stenosis.  4. The aortic valve is tricuspid. There is mild calcification of the aortic valve. There is mild thickening of the aortic valve. Aortic valve regurgitation is not visualized. Mild aortic valve sclerosis is present, with no evidence of aortic valve stenosis.  5. The inferior vena cava is normal in size with greater than 50% respiratory variability, suggesting right atrial pressure of 3 mmHg. FINDINGS  Left Ventricle: Left ventricular ejection fraction, by estimation, is 45 to 50%. The left ventricle has mildly decreased function. The left ventricle demonstrates regional wall motion  abnormalities. The left ventricular internal cavity size was normal in size. There is mild left ventricular hypertrophy of the basal segment. Left ventricular diastolic parameters are consistent with Grade I diastolic  dysfunction (impaired relaxation).  LV Wall Scoring: The mid anteroseptal segment and apical inferior segment are akinetic. The mid inferoseptal segment and mid inferior segment are hypokinetic. Right Ventricle: The right ventricular size is normal. No increase in right ventricular wall thickness. Right ventricular systolic function is normal. Left Atrium: Left atrial size was normal in size. Right Atrium: Right atrial size was normal in size. Pericardium: There is no evidence of pericardial effusion. Mitral Valve: The mitral valve is normal in structure. No evidence of mitral valve regurgitation. No evidence of mitral valve stenosis. Tricuspid Valve: The tricuspid valve is normal in structure. Tricuspid valve regurgitation is not demonstrated. No evidence of tricuspid stenosis. Aortic Valve: The aortic valve is tricuspid. There is mild calcification of the aortic valve. There is mild thickening of the aortic valve. Aortic valve regurgitation is not visualized. Mild aortic valve sclerosis is present, with no evidence of aortic valve stenosis. Pulmonic Valve: The pulmonic valve was normal in structure. Pulmonic valve regurgitation is not visualized. No evidence of pulmonic stenosis. Aorta: The aortic root is normal in size and structure. Venous: The inferior vena cava is normal in size with greater than 50% respiratory variability, suggesting right atrial pressure of 3 mmHg. IAS/Shunts: No atrial level shunt detected by color flow Doppler.  LEFT VENTRICLE PLAX 2D LVIDd:         4.40 cm  Diastology LVIDs:         2.90 cm  LV e' medial:    5.22 cm/s LV PW:         0.90 cm  LV E/e' medial:  10.8 LV IVS:        1.30 cm  LV e' lateral:   6.74 cm/s LVOT diam:     2.10 cm  LV E/e' lateral: 8.4 LV SV:          79 LV SV Index:   33 LVOT Area:     3.46 cm  RIGHT VENTRICLE            IVC RV Basal diam:  2.90 cm    IVC diam: 1.70 cm RV S prime:     7.94 cm/s TAPSE (M-mode): 1.5 cm LEFT ATRIUM             Index       RIGHT ATRIUM           Index LA diam:        3.80 cm 1.59 cm/m  RA Area:     14.70 cm LA Vol (A2C):   64.7 ml 27.10 ml/m RA Volume:   36.50 ml  15.29 ml/m LA Vol (A4C):   41.9 ml 17.55 ml/m LA Biplane Vol: 53.7 ml 22.49 ml/m  AORTIC VALVE LVOT Vmax:   101.00 cm/s LVOT Vmean:  64.300 cm/s LVOT VTI:    0.228 m  AORTA Ao Root diam: 3.30 cm Ao Asc diam:  3.50 cm MITRAL VALVE MV Area (PHT): 2.83 cm    SHUNTS MV Decel Time: 268 msec    Systemic VTI:  0.23 m MV E velocity: 56.60 cm/s  Systemic Diam: 2.10 cm MV A velocity: 75.80 cm/s MV E/A ratio:  0.75 Candee Furbish MD Electronically signed by Candee Furbish MD Signature Date/Time: 02/07/2021/4:13:19 PM    Final     Cardiac Studies      Patient Profile     75 y.o. male admitted with indigestion / chest pain   Assessment & Plan    1.  Unstable angina: The patient has  multiple cardiac risk factors including hypertension, hyperlipidemia, diabetes mellitus, obesity.  Enzymes are mildly elevated.  His chest pains are somewhat atypical but he does have [place] segmental wall motion abnormality that is suggestive of an LAD stenosis.  We discussed the risk, benefits, options regarding heart catheterization.  He understands and agrees to proceed.  2.  Hypertension: Continue current medications.  3.  Hyperlipidemia: Lipid levels look fairly well controlled.  His LDL is 62.  His triglyceride level is mildly elevated at 156.  HDL is 39. Continue rosuvastatin 5 mg a day.       For questions or updates, please contact [place] Please consult [email or web address] for contact info under        Signed, Mertie Moores, MD  02/08/2021, 8:55 AM

## 2021-02-08 NOTE — Progress Notes (Signed)
ANTICOAGULATION CONSULT NOTE - Follow Up Consult  Pharmacy Consult for heparin Indication: USAP  Labs: Recent Labs    02/07/21 0625 02/07/21 0820 02/07/21 1210 02/07/21 1544 02/08/21 0000 02/08/21 0129  HGB 15.5  --   --   --   --  14.5  HCT 47.2  --   --   --   --  43.2  PLT 419*  --   --   --   --  381  APTT  --   --   --  32  --   --   LABPROT  --   --   --  13.7  --   --   INR  --   --   --  1.1  --   --   HEPARINUNFRC  --   --   --   --  0.15*  --   CREATININE  --  1.00  --   --   --   --   TROPONINIHS 61* 71* 88* 81*  --   --     Assessment: 74yo male subtherapeutic on heparin with initial dosing for USAP; no gtt issues or signs of bleeding per RN.  Goal of Therapy:  Heparin level 0.3-0.7 units/ml   Plan:  Will rebolus with heparin 3000 units and increase heparin gtt by 3 units/kg/hr to 1600 units/hr and check level in 8 hours.    Wynona Neat, PharmD, BCPS  02/08/2021,2:03 AM

## 2021-02-09 ENCOUNTER — Encounter (HOSPITAL_COMMUNITY): Payer: Self-pay | Admitting: Internal Medicine

## 2021-02-09 DIAGNOSIS — I25119 Atherosclerotic heart disease of native coronary artery with unspecified angina pectoris: Secondary | ICD-10-CM

## 2021-02-09 DIAGNOSIS — I25110 Atherosclerotic heart disease of native coronary artery with unstable angina pectoris: Secondary | ICD-10-CM | POA: Diagnosis not present

## 2021-02-09 DIAGNOSIS — I5021 Acute systolic (congestive) heart failure: Secondary | ICD-10-CM

## 2021-02-09 DIAGNOSIS — E119 Type 2 diabetes mellitus without complications: Secondary | ICD-10-CM | POA: Diagnosis not present

## 2021-02-09 DIAGNOSIS — Z6841 Body Mass Index (BMI) 40.0 and over, adult: Secondary | ICD-10-CM | POA: Diagnosis not present

## 2021-02-09 DIAGNOSIS — I10 Essential (primary) hypertension: Secondary | ICD-10-CM | POA: Diagnosis not present

## 2021-02-09 LAB — CBC
HCT: 42.5 % (ref 39.0–52.0)
Hemoglobin: 14.2 g/dL (ref 13.0–17.0)
MCH: 28.9 pg (ref 26.0–34.0)
MCHC: 33.4 g/dL (ref 30.0–36.0)
MCV: 86.4 fL (ref 80.0–100.0)
Platelets: 344 10*3/uL (ref 150–400)
RBC: 4.92 MIL/uL (ref 4.22–5.81)
RDW: 15.5 % (ref 11.5–15.5)
WBC: 6.5 10*3/uL (ref 4.0–10.5)
nRBC: 0 % (ref 0.0–0.2)

## 2021-02-09 LAB — BASIC METABOLIC PANEL
Anion gap: 8 (ref 5–15)
BUN: 16 mg/dL (ref 8–23)
CO2: 22 mmol/L (ref 22–32)
Calcium: 9.3 mg/dL (ref 8.9–10.3)
Chloride: 107 mmol/L (ref 98–111)
Creatinine, Ser: 0.91 mg/dL (ref 0.61–1.24)
GFR, Estimated: >60 mL/min (ref >60)
Glucose, Bld: 127 mg/dL — ABNORMAL HIGH (ref 70–99)
Potassium: 4 mmol/L (ref 3.5–5.1)
Sodium: 137 mmol/L (ref 135–145)

## 2021-02-09 LAB — GLUCOSE, CAPILLARY: Glucose-Capillary: 148 mg/dL — ABNORMAL HIGH (ref 70–99)

## 2021-02-09 MED ORDER — TICAGRELOR 90 MG PO TABS: 90.0000 mg | ORAL_TABLET | Freq: Two times a day (BID) | ORAL | 2 refills | Status: DC

## 2021-02-09 MED ORDER — NITROGLYCERIN 0.4 MG SL SUBL: 0.4000 mg | SUBLINGUAL_TABLET | SUBLINGUAL | 12 refills | Status: DC | PRN

## 2021-02-09 MED ORDER — ASPIRIN 81 MG PO CHEW: 81.0000 mg | CHEWABLE_TABLET | Freq: Every day | ORAL | 2 refills | Status: AC

## 2021-02-09 MED ORDER — PANTOPRAZOLE SODIUM 40 MG PO TBEC: 40.0000 mg | DELAYED_RELEASE_TABLET | Freq: Every day | ORAL | 2 refills | Status: DC

## 2021-02-09 MED ORDER — METOPROLOL TARTRATE 25 MG PO TABS
25.0000 mg | ORAL_TABLET | Freq: Two times a day (BID) | ORAL | Status: DC
  Administered 2021-02-09: 25 mg via ORAL
  Filled 2021-02-09: qty 1

## 2021-02-09 MED ORDER — LOSARTAN POTASSIUM 50 MG PO TABS: 100.0000 mg | ORAL_TABLET | Freq: Every day | ORAL | Status: DC

## 2021-02-09 MED ORDER — LOSARTAN POTASSIUM 50 MG PO TABS: 100.0000 mg | ORAL_TABLET | Freq: Every day | ORAL | 2 refills | Status: DC

## 2021-02-09 MED ORDER — METOPROLOL TARTRATE 25 MG PO TABS: 25.0000 mg | ORAL_TABLET | Freq: Two times a day (BID) | ORAL | 2 refills | Status: DC

## 2021-02-09 MED FILL — Heparin Sod (Porcine)-NaCl IV Soln 1000 Unit/500ML-0.9%: INTRAVENOUS | Qty: 500 | Status: AC

## 2021-02-09 NOTE — Care Management (Signed)
02-09-21 Benefits check submitted for Brilinta. Case Manager will follow for cost. Graves-Bigelow, Ocie Cornfield, RN,BSN Case Manager

## 2021-02-09 NOTE — Progress Notes (Signed)
Patient given discharge instructions and stated understanding. 

## 2021-02-09 NOTE — Progress Notes (Signed)
Progress Note  Patient Name: Justin Cohen Date of Encounter: 02/09/2021  [facility] Cardiologist: Marlou Porch   Subjective   75 year old gentleman with a history of diabetes, hypertension, hyperlipidemia, morbid obesity.  He was admitted with chest pain and very minimally elevated troponin levels.  He has mildly reduced left ventricular systolic function with an EF of 45%.  He has akinesis of the mid/distal anteroseptal wall.  He had a heart catheterization yesterday which revealed an extremely tight proximal LAD stenosis.  It was successfully stented by Dr. Burt Knack.  Inpatient Medications    Scheduled Meds: . amLODipine  10 mg Oral Daily   And  . benazepril  20 mg Oral Daily  . aspirin  81 mg Oral Daily  . cholecalciferol  2,000 Units Oral Daily  . fluticasone  2 spray Each Nare Daily  . insulin aspart  0-15 Units Subcutaneous TID WC  . insulin aspart  0-5 Units Subcutaneous QHS  . latanoprost  1 drop Both Eyes QHS  . loratadine  10 mg Oral Daily  . multivitamin with minerals  1 tablet Oral Daily  . omega-3 acid ethyl esters  1 g Oral Daily  . pantoprazole  40 mg Oral Daily  . rosuvastatin  5 mg Oral QPM  . simethicone  80 mg Oral QID  . sodium chloride flush  3 mL Intravenous Q12H  . sodium chloride flush  3 mL Intravenous Q12H  . ticagrelor  90 mg Oral BID  . venlafaxine XR  75 mg Oral Q breakfast   Continuous Infusions: . sodium chloride     PRN Meds: sodium chloride, acetaminophen, hydrALAZINE, nitroGLYCERIN, ondansetron (ZOFRAN) IV, sodium chloride flush   Vital Signs    Vitals:   02/08/21 1710 02/08/21 2108 02/08/21 2338 02/09/21 0338  BP: 137/68 132/67 130/66 132/76  Pulse: 69 75 60 73  Resp: 18 16  16   Temp: 97.6 F (36.4 C) 97.8 F (36.6 C) 98 F (36.7 C) 97.7 F (36.5 C)  TempSrc: Oral Oral Oral Oral  SpO2: 96% 98% 100% 100%  Weight:    110.9 kg  Height:        Intake/Output Summary (Last 24 hours) at 02/09/2021 1019 Last data filed at  02/09/2021 0443 Gross per 24 hour  Intake 1598.33 ml  Output 1160 ml  Net 438.33 ml   Last 3 Weights 02/09/2021 02/08/2021 02/07/2021  Weight (lbs) 244 lb 9.6 oz 246 lb 4.8 oz 246 lb 9.6 oz  Weight (kg) 110.95 kg 111.721 kg 111.857 kg      Telemetry    NSR  - Personally Reviewed  ECG    NSR  - Personally Reviewed  Physical Exam   Physical Exam: Blood pressure 132/76, pulse 73, temperature 97.7 F (36.5 C), temperature source Oral, resp. rate 16, height 5\' 9"  (1.753 m), weight 110.9 kg, SpO2 100 %.  GEN:   Elderly male, NAD  HEENT: Normal NECK: No JVD; No carotid bruits LYMPHATICS: No lymphadenopathy CARDIAC: RRR   RESPIRATORY:  Clear to auscultation without rales, wheezing or rhonchi  ABDOMEN: Soft, non-tender, non-distended MUSCULOSKELETAL:  No edema; No deformity  SKIN: Warm and dry NEUROLOGIC:  Alert and oriented x 3   Labs    High Sensitivity Troponin:   Recent Labs  Lab 02/07/21 0625 02/07/21 0820 02/07/21 1210 02/07/21 1544  TROPONINIHS 61* 71* 88* 81*      Chemistry Recent Labs  Lab 02/07/21 0820 02/09/21 0351  NA 138 137  K 4.9 4.0  CL 105  107  CO2 27 22  GLUCOSE 165* 127*  BUN 28* 16  CREATININE 1.00 0.91  CALCIUM 9.7 9.3  GFRNONAA >60 >60  ANIONGAP 6 8     Hematology Recent Labs  Lab 02/07/21 0625 02/08/21 0129 02/09/21 0351  WBC 6.7 6.8 6.5  RBC 5.43 4.97 4.92  HGB 15.5 14.5 14.2  HCT 47.2 43.2 42.5  MCV 86.9 86.9 86.4  MCH 28.5 29.2 28.9  MCHC 32.8 33.6 33.4  RDW 15.7* 15.6* 15.5  PLT 419* 381 344    BNP Recent Labs  Lab 02/07/21 1210  BNP 35.1     DDimer No results for input(s): DDIMER in the last 168 hours.   Radiology    CARDIAC CATHETERIZATION  Result Date: 02/08/2021  Ost LAD to Prox LAD lesion is 95% stenosed.  A drug-eluting stent was successfully placed using a SYNERGY XD 3.50X16.  Post intervention, there is a 0% residual stenosis.  Prox RCA lesion is 50% stenosed.  Mid LM to Dist LM lesion is 30%  stenosed.  1.  Critical ostial/proximal LAD stenosis, treated successfully with PCI using a 3.5 x 16 mm Synergy DES 2.  Patent left main and left circumflex with mild nonobstructive stenosis 3.  Nonobstructive mid RCA stenosis, estimated at 40 to 50% Recommend: Dual antiplatelet therapy with aspirin and ticagrelor x12 months (ACS class I recommendation), aggressive medical therapy and risk reduction.  As long as no complications arise, patient eligible for hospital discharge tomorrow morning.   ECHOCARDIOGRAM COMPLETE  Result Date: 02/07/2021    ECHOCARDIOGRAM REPORT   Patient Name:   Justin Cohen Date of Exam: 02/07/2021 Medical Rec #:  [id number]         Height:       69.0 in Accession #:    [id number]        Weight:       281.1 lb Date of Birth:  [birth year]          BSA:          2.388 m Patient Age:    71 years          BP:           148/78 mmHg Patient Gender: M                 HR:           55 bpm. Exam Location:  Inpatient Procedure: 2D Echo, Cardiac Doppler and Color Doppler Indications:    R07.9* Chest pain, unspecified  History:        Patient has no prior history of Echocardiogram examinations.                 Risk Factors:Hypertension, Diabetes, Dyslipidemia and Sleep                 Apnea. COVID-19. Cancer.  Sonographer:    Tiffany Dance Referring Phys: [id number] Lee  1. Left ventricular ejection fraction, by estimation, is 45 to 50%. The left ventricle has mildly decreased function. The left ventricle demonstrates regional wall motion abnormalities (see scoring diagram/findings for description). There is mild left ventricular hypertrophy of the basal segment. Left ventricular diastolic parameters are consistent with Grade I diastolic dysfunction (impaired relaxation).  2. Right ventricular systolic function is normal. The right ventricular size is normal.  3. The mitral valve is normal in structure. No evidence of mitral valve regurgitation. No evidence of mitral stenosis.  4.  The aortic valve is tricuspid.  There is mild calcification of the aortic valve. There is mild thickening of the aortic valve. Aortic valve regurgitation is not visualized. Mild aortic valve sclerosis is present, with no evidence of aortic valve stenosis.  5. The inferior vena cava is normal in size with greater than 50% respiratory variability, suggesting right atrial pressure of 3 mmHg. FINDINGS  Left Ventricle: Left ventricular ejection fraction, by estimation, is 45 to 50%. The left ventricle has mildly decreased function. The left ventricle demonstrates regional wall motion abnormalities. The left ventricular internal cavity size was normal in size. There is mild left ventricular hypertrophy of the basal segment. Left ventricular diastolic parameters are consistent with Grade I diastolic dysfunction (impaired relaxation).  LV Wall Scoring: The mid anteroseptal segment and apical inferior segment are akinetic. The mid inferoseptal segment and mid inferior segment are hypokinetic. Right Ventricle: The right ventricular size is normal. No increase in right ventricular wall thickness. Right ventricular systolic function is normal. Left Atrium: Left atrial size was normal in size. Right Atrium: Right atrial size was normal in size. Pericardium: There is no evidence of pericardial effusion. Mitral Valve: The mitral valve is normal in structure. No evidence of mitral valve regurgitation. No evidence of mitral valve stenosis. Tricuspid Valve: The tricuspid valve is normal in structure. Tricuspid valve regurgitation is not demonstrated. No evidence of tricuspid stenosis. Aortic Valve: The aortic valve is tricuspid. There is mild calcification of the aortic valve. There is mild thickening of the aortic valve. Aortic valve regurgitation is not visualized. Mild aortic valve sclerosis is present, with no evidence of aortic valve stenosis. Pulmonic Valve: The pulmonic valve was normal in structure. Pulmonic valve  regurgitation is not visualized. No evidence of pulmonic stenosis. Aorta: The aortic root is normal in size and structure. Venous: The inferior vena cava is normal in size with greater than 50% respiratory variability, suggesting right atrial pressure of 3 mmHg. IAS/Shunts: No atrial level shunt detected by color flow Doppler.  LEFT VENTRICLE PLAX 2D LVIDd:         4.40 cm  Diastology LVIDs:         2.90 cm  LV e' medial:    5.22 cm/s LV PW:         0.90 cm  LV E/e' medial:  10.8 LV IVS:        1.30 cm  LV e' lateral:   6.74 cm/s LVOT diam:     2.10 cm  LV E/e' lateral: 8.4 LV SV:         79 LV SV Index:   33 LVOT Area:     3.46 cm  RIGHT VENTRICLE            IVC RV Basal diam:  2.90 cm    IVC diam: 1.70 cm RV S prime:     7.94 cm/s TAPSE (M-mode): 1.5 cm LEFT ATRIUM             Index       RIGHT ATRIUM           Index LA diam:        3.80 cm 1.59 cm/m  RA Area:     14.70 cm LA Vol (A2C):   64.7 ml 27.10 ml/m RA Volume:   36.50 ml  15.29 ml/m LA Vol (A4C):   41.9 ml 17.55 ml/m LA Biplane Vol: 53.7 ml 22.49 ml/m  AORTIC VALVE LVOT Vmax:   101.00 cm/s LVOT Vmean:  64.300 cm/s LVOT VTI:  0.228 m  AORTA Ao Root diam: 3.30 cm Ao Asc diam:  3.50 cm MITRAL VALVE MV Area (PHT): 2.83 cm    SHUNTS MV Decel Time: 268 msec    Systemic VTI:  0.23 m MV E velocity: 56.60 cm/s  Systemic Diam: 2.10 cm MV A velocity: 75.80 cm/s MV E/A ratio:  0.75 Candee Furbish MD Electronically signed by Candee Furbish MD Signature Date/Time: 02/07/2021/4:13:19 PM    Final     Cardiac Studies      Patient Profile     75 y.o. male admitted with indigestion / chest pain   Assessment & Plan    1.  Unstable angina:  S/p stenting of his prox LAD  DC to home on ASA 81 mg a day  Brininta 90 mg BID - please give him a voucher card  Follow up with Dr. Marlou Porch or APP in 3-4 weeks   2.  Hypertension: DC amlodipine ( has mildly reduced EF) and have stopped the benazepril ( he was taking amlodipine -benazapril combo pill. Start losartan  100 mg a day ,  Metoprolol 25 bid   3.  Hyperlipidemia: Lipid levels look fairly well controlled.  His LDL is 62.  His triglyceride level is mildly elevated at 156.  HDL is 39. Continue rosuvastatin 5 mg a day.  4.  DM :   Please have him hold metformin for 2 days        For questions or updates, please contact [place] Please consult [email or web address] for contact info under        Signed, Mertie Moores, MD  02/09/2021, 10:19 AM

## 2021-02-09 NOTE — TOC Benefit Eligibility Note (Signed)
Transition of Care [facility]) Benefit Eligibility Note    Patient Details  Name: JOSHAWA DUBIN MRN: [id number] Date of Birth: [birth year]   Medication/Dose: Kary Kos  90 MG BID     Tier: 3 Drug  Prescription Coverage Preferred Pharmacy: [facility] [place] with Person/Company/Phone Number:: LEWIS  @ [employer] # [phone]  Co-Pay: $ 45.00  Prior Approval: No  Deductible:  (NO DEDUCTIBLE WITH PLAN)  Additional Notes: TICAGRELOR:NON-FORMULARY    Memory Argue Phone Number: 02/09/2021, 10:44 AM

## 2021-02-09 NOTE — Progress Notes (Signed)
CARDIAC REHAB PHASE I   PRE:  Rate/Rhythm: 71 SR  BP:  Sitting: 137/80      SaO2: 96 RA  MODE:  Ambulation: 300 ft   POST:  Rate/Rhythm: 92 SR  BP:  Sitting: 142/71    SaO2: 97 RA   Pt ambulated 369ft in hallway independently with steady gait. Pt denies CP, SOB, or dizziness, able to maintain conversation throughout walk. Stent education completed with pt and wife. Pt educated on importance of ASA, Brilinta, and statin. Pt given stent card along with heart healthy and diabetic diets. Reviewed site care, restrictions, and exercise guidelines. Will refer to CRP II Agenda.  [id number] Rufina Falco, RN BSN 02/09/2021 9:20 AM

## 2021-02-09 NOTE — Discharge Instructions (Addendum)
Chest Wall Pain Chest wall pain is pain in or around the bones and muscles of your chest. Chest wall pain may be caused by:  An injury.  Coughing a lot.  Using your chest and arm muscles too much. Sometimes, the cause may not be known. This pain may take a few weeks or longer to get better. Follow these instructions at home: Managing pain, stiffness, and swelling If told, put ice on the painful area:  Put ice in a plastic bag.  Place a towel between your skin and the bag.  Leave the ice on for 20 minutes, 2-3 times a day.   Activity  Rest as told by your doctor.  Avoid doing things that cause pain. This includes lifting heavy items.  Ask your doctor what activities are safe for you. General instructions  Take over-the-counter and prescription medicines only as told by your doctor.  Do not use any products that contain nicotine or tobacco, such as cigarettes, e-cigarettes, and chewing tobacco. If you need help quitting, ask your doctor.  Keep all follow-up visits as told by your doctor. This is important.   Contact a doctor if:  You have a fever.  Your chest pain gets worse.  You have new symptoms. Get help right away if:  You feel sick to your stomach (nauseous) or you throw up (vomit).  You feel sweaty or light-headed.  You have a cough with mucus from your lungs (sputum) or you cough up blood.  You are short of breath. These symptoms may be an emergency. Do not wait to see if the symptoms will go away. Get medical help right away. Call your local emergency services (911 in the [place] Do not drive yourself to the hospital. Summary  Chest wall pain is pain in or around the bones and muscles of your chest.  It may be treated with ice, rest, and medicines. Your condition may also get better if you avoid doing things that cause pain.  Contact a doctor if you have a fever, chest pain that gets worse, or new symptoms.  Get help right away if you feel light-headed  or you get short of breath. These symptoms may be an emergency. This information is not intended to replace advice given to you by your health care provider. Make sure you discuss any questions you have with your health care provider. Document Revised: 04/26/2018 Document Reviewed: 04/26/2018 Elsevier Patient Education  2021 [employer]  This sheet gives you information about how to care for yourself after your procedure. Your health care provider may also give you more specific instructions. If you have problems or questions, contact your health care provider. What can I expect after the procedure? After the procedure, it is common to have:  Bruising and tenderness at the catheter insertion area. Follow these instructions at home: Medicines  Take over-the-counter and prescription medicines only as told by your health care provider. Insertion site care  Follow instructions from your health care provider about how to take care of your insertion site. Make sure you: ? Wash your hands with soap and water before you change your bandage (dressing). If soap and water are not available, use hand sanitizer. ? Change your dressing as told by your health care provider. ? Leave stitches (sutures), skin glue, or adhesive strips in place. These skin closures may need to stay in place for 2 weeks or longer. If adhesive strip edges start to loosen and curl up, you may  trim the loose edges. Do not remove adhesive strips completely unless your health care provider tells you to do that.  Check your insertion site every day for signs of infection. Check for: ? Redness, swelling, or pain. ? Fluid or blood. ? Pus or a bad smell. ? Warmth.  Do not take baths, swim, or use a hot tub until your health care provider approves.  You may shower 24-48 hours after the procedure, or as directed by your health care provider. ? Remove the dressing and gently wash the site with plain soap and  water. ? Pat the area dry with a clean towel. ? Do not rub the site. That could cause bleeding.  Do not apply powder or lotion to the site. Activity  For 24 hours after the procedure, or as directed by your health care provider: ? Do not flex or bend the affected arm. ? Do not push or pull heavy objects with the affected arm. ? Do not drive yourself home from the hospital or clinic. You may drive 24 hours after the procedure unless your health care provider tells you not to. ? Do not operate machinery or power tools.  Do not lift anything that is heavier than 10 lb (4.5 kg), or the limit that you are told, until your health care provider says that it is safe.  Ask your health care provider when it is okay to: ? Return to work or school. ? Resume usual physical activities or sports. ? Resume sexual activity.   General instructions  If the catheter site starts to bleed, raise your arm and put firm pressure on the site. If the bleeding does not stop, get help right away. This is a medical emergency.  If you went home on the same day as your procedure, a responsible adult should be with you for the first 24 hours after you arrive home.  Keep all follow-up visits as told by your health care provider. This is important. Contact a health care provider if:  You have a fever.  You have redness, swelling, or yellow drainage around your insertion site. Get help right away if:  You have unusual pain at the radial site.  The catheter insertion area swells very fast.  The insertion area is bleeding, and the bleeding does not stop when you hold steady pressure on the area.  Your arm or hand becomes pale, cool, tingly, or numb. These symptoms may represent a serious problem that is an emergency. Do not wait to see if the symptoms will go away. Get medical help right away. Call your local emergency services (911 in the [place] Do not drive yourself to the hospital. Summary  After the  procedure, it is common to have bruising and tenderness at the site.  Follow instructions from your health care provider about how to take care of your radial site wound. Check the wound every day for signs of infection.  Do not lift anything that is heavier than 10 lb (4.5 kg), or the limit that you are told, until your health care provider says that it is safe. This information is not intended to replace advice given to you by your health care provider. Make sure you discuss any questions you have with your health care provider. Document Revised: 11/29/2017 Document Reviewed: 11/29/2017 Elsevier Patient Education  2021 [employer].

## 2021-02-09 NOTE — Plan of Care (Signed)
  Problem: Education: Goal: Knowledge of General Education information will improve Description: Including pain rating scale, medication(s)/side effects and non-pharmacologic comfort measures Outcome: Adequate for Discharge   Problem: Health Behavior/Discharge Planning: Goal: Ability to manage health-related needs will improve Outcome: Adequate for Discharge   Problem: Clinical Measurements: Goal: Ability to maintain clinical measurements within normal limits will improve Outcome: Adequate for Discharge Goal: Will remain free from infection Outcome: Adequate for Discharge Goal: Diagnostic test results will improve Outcome: Adequate for Discharge Goal: Respiratory complications will improve Outcome: Adequate for Discharge Goal: Cardiovascular complication will be avoided Outcome: Adequate for Discharge   Problem: Elimination: Goal: Will not experience complications related to bowel motility Outcome: Adequate for Discharge Goal: Will not experience complications related to urinary retention Outcome: Adequate for Discharge   Problem: Safety: Goal: Ability to remain free from injury will improve Outcome: Adequate for Discharge   Problem: Education: Goal: Understanding of CV disease, CV risk reduction, and recovery process will improve Outcome: Adequate for Discharge Goal: Individualized Educational Video(s) Outcome: Adequate for Discharge   Problem: Activity: Goal: Ability to return to baseline activity level will improve Outcome: Adequate for Discharge   Problem: Cardiovascular: Goal: Ability to achieve and maintain adequate cardiovascular perfusion will improve Outcome: Adequate for Discharge Goal: Vascular access site(s) Level 0-1 will be maintained Outcome: Adequate for Discharge   Problem: Health Behavior/Discharge Planning: Goal: Ability to safely manage health-related needs after discharge will improve Outcome: Adequate for Discharge

## 2021-02-09 NOTE — Discharge Summary (Signed)
Physician Discharge Summary  Justin Cohen QJJ:[id number] DOB: [birth year] DOA: 02/07/2021  PCP: Antony Contras, MD  Admit date: 02/07/2021 Discharge date: 02/09/2021  Admitted From: Home Disposition: Home  Recommendations for Outpatient Follow-up:  1. Follow up with PCP in 1-2 weeks 2. Follow-up with cardiac rehab. 3. Cardiology clinic to schedule follow-up.  Home Health: Not applicable Equipment/Devices: Not needed  Discharge Condition: Stable CODE STATUS: Full code for CPR purpose Diet recommendation: Low-salt diet.  Low-carb diet.  Discharge summary: 75 year old gentleman with history of prostate cancer, type 2 diabetes, hypertension, hyperlipidemia presented to the hospital with chest pressure sensation, indigestion syndrome for about 2 days.  Tried different antacids at home with no help so came to the ER.  In the emergency room, mildly elevated troponins.  EKG with no acute ischemia but ST depression.  Chest pain-free since admission.  Admitted with unstable angina with cardiac cath plans.  Unstable angina: Admitted to the hospital.  Started on heparin infusion. Underwent left heart cath 4/4, drug-eluting stent proximal LAD.  Clinically improved.  Mildly reduced ejection fraction on echocardiogram. As per cardiology recommendations, Aspirin 81 mg, Brilinta 90 mg twice daily to continue as dual antiplatelet therapy. Patient is on Crestor, LDL is 62 that he will continue. We will add beta-blockers, started on metoprolol 25 mg twice daily, discontinue amlodipine. He was taking combination of amlodipine and benazepril, discontinue and start losartan 100 mg daily. Blood sugars are well controlled.  On Actos and Metformin at home.  Resume Actos.  Start Metformin after 48 hours. Cardiac rehab. Also has a history of GERD, continue PPI once a day.  Takes over-the-counter lansoprazole, Protonix 40 mg daily prescribed.  Ambulating around.  Stable for discharge.  Follow-up with cardiac  rehab and cardiology clinic.   Discharge Diagnoses:  Active Problems:   Chest pain due to CAD [facility])   Unstable angina [facility])    Discharge Instructions  Discharge Instructions    Amb Referral to Cardiac Rehabilitation   Complete by: As directed    Will refer to Cardiac Rehab Phase 2 [place]   Diagnosis: Coronary Stents   After initial evaluation and assessments completed: Virtual Based Care may be provided alone or in conjunction with Phase 2 Cardiac Rehab based on patient barriers.: Yes   Call MD for:   Complete by: As directed    Recurrent chest pain.   Call MD for:  difficulty breathing, headache or visual disturbances   Complete by: As directed    Diet - low sodium heart healthy   Complete by: As directed    Diet Carb Modified   Complete by: As directed    Discharge instructions   Complete by: As directed    No heavy exertion for 2 weeks. Start taking Metformin on 4/7.  You can start taking Actos today.   Increase activity slowly   Complete by: As directed      Allergies as of 02/09/2021   No Known Allergies     Medication List    STOP taking these medications   amLODipine-benazepril 10-20 MG capsule Commonly known as: LOTREL     TAKE these medications   aspirin 81 MG chewable tablet Chew 1 tablet (81 mg total) by mouth daily. Start taking on: February 10, 2021   cetirizine 10 MG tablet Commonly known as: ZYRTEC Take 10 mg by mouth daily.   Fish Oil 1000 MG Caps Take 1,000 mg by mouth daily.   fluticasone 50 MCG/ACT nasal spray Commonly known as: [place]  2 sprays into both nostrils daily.   latanoprost 0.005 % ophthalmic solution Commonly known as: XALATAN Place 1 drop into both eyes at bedtime.   losartan 50 MG tablet Commonly known as: COZAAR Take 2 tablets (100 mg total) by mouth daily. Start taking on: February 10, 2021   metFORMIN 1000 MG tablet Commonly known as: GLUCOPHAGE Take 1,000 mg by mouth 2 (two) times daily with a meal.    metoprolol tartrate 25 MG tablet Commonly known as: LOPRESSOR Take 1 tablet (25 mg total) by mouth 2 (two) times daily.   multivitamin with minerals Tabs tablet Take 1 tablet by mouth daily.   nitroGLYCERIN 0.4 MG SL tablet Commonly known as: NITROSTAT Place 1 tablet (0.4 mg total) under the tongue every 5 (five) minutes as needed for chest pain.   pantoprazole 40 MG tablet Commonly known as: PROTONIX Take 1 tablet (40 mg total) by mouth daily. Start taking on: February 10, 2021   pioglitazone 45 MG tablet Commonly known as: ACTOS Take 45 mg by mouth daily.   PROBIOTIC DAILY PO Take 1 capsule by mouth daily.   rosuvastatin 20 MG tablet Commonly known as: CRESTOR Take 5 mg by mouth every evening.   ticagrelor 90 MG Tabs tablet Commonly known as: BRILINTA Take 1 tablet (90 mg total) by mouth 2 (two) times daily.   venlafaxine XR 75 MG 24 hr capsule Commonly known as: EFFEXOR-XR Take 75 mg by mouth daily with breakfast.   Vitamin D 50 MCG (2000 UT) tablet Take 2,000 Units by mouth daily.       No Known Allergies  Consultations:  Cardiology   Procedures/Studies: DG Chest 2 View  Result Date: 02/07/2021 CLINICAL DATA:  75 year old male with chest pain for 2-3 days exacerbated by lying down. EXAM: CHEST - 2 VIEW COMPARISON:  Chest radiographs 09/11/2017 and earlier. FINDINGS: Lung volumes and mediastinal contours are stable and within normal limits, mild eventration of the right hemidiaphragm (normal variant). Visualized tracheal air column is within normal limits. Stable lung markings since 2016. No pneumothorax, pulmonary edema, pleural effusion or confluent pulmonary opacity. No acute osseous abnormality identified. Paucity of bowel gas in the upper abdomen. IMPRESSION: No acute cardiopulmonary abnormality. Electronically Signed   By: Genevie Ann M.D.   On: 02/07/2021 07:27   CARDIAC CATHETERIZATION  Result Date: 02/08/2021  Ost LAD to Prox LAD lesion is 95% stenosed.  A  drug-eluting stent was successfully placed using a SYNERGY XD 3.50X16.  Post intervention, there is a 0% residual stenosis.  Prox RCA lesion is 50% stenosed.  Mid LM to Dist LM lesion is 30% stenosed.  1.  Critical ostial/proximal LAD stenosis, treated successfully with PCI using a 3.5 x 16 mm Synergy DES 2.  Patent left main and left circumflex with mild nonobstructive stenosis 3.  Nonobstructive mid RCA stenosis, estimated at 40 to 50% Recommend: Dual antiplatelet therapy with aspirin and ticagrelor x12 months (ACS class I recommendation), aggressive medical therapy and risk reduction.  As long as no complications arise, patient eligible for hospital discharge tomorrow morning.   ECHOCARDIOGRAM COMPLETE  Result Date: 02/07/2021    ECHOCARDIOGRAM REPORT   Patient Name:   Justin Cohen Date of Exam: 02/07/2021 Medical Rec #:  [id number]         Height:       69.0 in Accession #:    [id number]        Weight:       281.1 lb Date of Birth:  30-Jun-1946  BSA:          2.388 m Patient Age:    37 years          BP:           148/78 mmHg Patient Gender: M                 HR:           55 bpm. Exam Location:  Inpatient Procedure: 2D Echo, Cardiac Doppler and Color Doppler Indications:    R07.9* Chest pain, unspecified  History:        Patient has no prior history of Echocardiogram examinations.                 Risk Factors:Hypertension, Diabetes, Dyslipidemia and Sleep                 Apnea. COVID-19. Cancer.  Sonographer:    Tiffany Dance Referring Phys: [id number] [place]  1. Left ventricular ejection fraction, by estimation, is 45 to 50%. The left ventricle has mildly decreased function. The left ventricle demonstrates regional wall motion abnormalities (see scoring diagram/findings for description). There is mild left ventricular hypertrophy of the basal segment. Left ventricular diastolic parameters are consistent with Grade I diastolic dysfunction (impaired relaxation).  2. Right  ventricular systolic function is normal. The right ventricular size is normal.  3. The mitral valve is normal in structure. No evidence of mitral valve regurgitation. No evidence of mitral stenosis.  4. The aortic valve is tricuspid. There is mild calcification of the aortic valve. There is mild thickening of the aortic valve. Aortic valve regurgitation is not visualized. Mild aortic valve sclerosis is present, with no evidence of aortic valve stenosis.  5. The inferior vena cava is normal in size with greater than 50% respiratory variability, suggesting right atrial pressure of 3 mmHg. FINDINGS  Left Ventricle: Left ventricular ejection fraction, by estimation, is 45 to 50%. The left ventricle has mildly decreased function. The left ventricle demonstrates regional wall motion abnormalities. The left ventricular internal cavity size was normal in size. There is mild left ventricular hypertrophy of the basal segment. Left ventricular diastolic parameters are consistent with Grade I diastolic dysfunction (impaired relaxation).  LV Wall Scoring: The mid anteroseptal segment and apical inferior segment are akinetic. The mid inferoseptal segment and mid inferior segment are hypokinetic. Right Ventricle: The right ventricular size is normal. No increase in right ventricular wall thickness. Right ventricular systolic function is normal. Left Atrium: Left atrial size was normal in size. Right Atrium: Right atrial size was normal in size. Pericardium: There is no evidence of pericardial effusion. Mitral Valve: The mitral valve is normal in structure. No evidence of mitral valve regurgitation. No evidence of mitral valve stenosis. Tricuspid Valve: The tricuspid valve is normal in structure. Tricuspid valve regurgitation is not demonstrated. No evidence of tricuspid stenosis. Aortic Valve: The aortic valve is tricuspid. There is mild calcification of the aortic valve. There is mild thickening of the aortic valve. Aortic valve  regurgitation is not visualized. Mild aortic valve sclerosis is present, with no evidence of aortic valve stenosis. Pulmonic Valve: The pulmonic valve was normal in structure. Pulmonic valve regurgitation is not visualized. No evidence of pulmonic stenosis. Aorta: The aortic root is normal in size and structure. Venous: The inferior vena cava is normal in size with greater than 50% respiratory variability, suggesting right atrial pressure of 3 mmHg. IAS/Shunts: No atrial level shunt detected by color flow Doppler.  LEFT VENTRICLE PLAX 2D LVIDd:         4.40 cm  Diastology LVIDs:         2.90 cm  LV e' medial:    5.22 cm/s LV PW:         0.90 cm  LV E/e' medial:  10.8 LV IVS:        1.30 cm  LV e' lateral:   6.74 cm/s LVOT diam:     2.10 cm  LV E/e' lateral: 8.4 LV SV:         79 LV SV Index:   33 LVOT Area:     3.46 cm  RIGHT VENTRICLE            IVC RV Basal diam:  2.90 cm    IVC diam: 1.70 cm RV S prime:     7.94 cm/s TAPSE (M-mode): 1.5 cm LEFT ATRIUM             Index       RIGHT ATRIUM           Index LA diam:        3.80 cm 1.59 cm/m  RA Area:     14.70 cm LA Vol (A2C):   64.7 ml 27.10 ml/m RA Volume:   36.50 ml  15.29 ml/m LA Vol (A4C):   41.9 ml 17.55 ml/m LA Biplane Vol: 53.7 ml 22.49 ml/m  AORTIC VALVE LVOT Vmax:   101.00 cm/s LVOT Vmean:  64.300 cm/s LVOT VTI:    0.228 m  AORTA Ao Root diam: 3.30 cm Ao Asc diam:  3.50 cm MITRAL VALVE MV Area (PHT): 2.83 cm    SHUNTS MV Decel Time: 268 msec    Systemic VTI:  0.23 m MV E velocity: 56.60 cm/s  Systemic Diam: 2.10 cm MV A velocity: 75.80 cm/s MV E/A ratio:  0.75 Candee Furbish MD Electronically signed by Candee Furbish MD Signature Date/Time: 02/07/2021/4:13:19 PM    Final    (Echo, Carotid, EGD, Colonoscopy, ERCP)    Subjective: Patient seen and examined.  Wife at the bedside.  Multiple questions answered.  Diagnosis discussed.  Family very appreciative of care and diagnosed blockage of arteries.    Discharge Exam: Vitals:   02/09/21 0338 02/09/21  1019  BP: 132/76 131/73  Pulse: 73   Resp: 16   Temp: 97.7 F (36.5 C)   SpO2: 100%    Vitals:   02/08/21 2108 02/08/21 2338 02/09/21 0338 02/09/21 1019  BP: 132/67 130/66 132/76 131/73  Pulse: 75 60 73   Resp: 16  16   Temp: 97.8 F (36.6 C) 98 F (36.7 C) 97.7 F (36.5 C)   TempSrc: Oral Oral Oral   SpO2: 98% 100% 100%   Weight:   110.9 kg   Height:        General: Pt is alert, awake, not in acute distress Cardiovascular: RRR, S1/S2 +, no rubs, no gallops Respiratory: CTA bilaterally, no wheezing, no rhonchi Abdominal: Soft, NT, ND, bowel sounds + Extremities: no edema, no cyanosis Right wrist incision clean dry.  No swelling.    The results of significant diagnostics from this hospitalization (including imaging, microbiology, ancillary and laboratory) are listed below for reference.     Microbiology: Recent Results (from the past 240 hour(s))  Resp Panel by RT-PCR (Flu A&B, Covid) Nasopharyngeal Swab     Status: None   Collection Time: 02/07/21 10:08 AM   Specimen: Nasopharyngeal Swab; Nasopharyngeal(NP) swabs in vial transport medium  Result Value Ref Range Status   SARS Coronavirus 2 by RT PCR NEGATIVE NEGATIVE Final    Comment: (NOTE) SARS-CoV-2 target nucleic acids are NOT DETECTED.  The SARS-CoV-2 RNA is generally detectable in upper respiratory specimens during the acute phase of infection. The lowest concentration of SARS-CoV-2 viral copies this assay can detect is 138 copies/mL. A negative result does not preclude SARS-Cov-2 infection and should not be used as the sole basis for treatment or other patient management decisions. A negative result may occur with  improper specimen collection/handling, submission of specimen other than nasopharyngeal swab, presence of viral mutation(s) within the areas targeted by this assay, and inadequate number of viral copies(<138 copies/mL). A negative result must be combined with clinical observations, patient  history, and epidemiological information. The expected result is Negative.  Fact Sheet for Patients:  EntrepreneurPulse.com.au  Fact Sheet for Healthcare Providers:  IncredibleEmployment.be  This test is no t yet approved or cleared by the [place] FDA and  has been authorized for detection and/or diagnosis of SARS-CoV-2 by FDA under an Emergency Use Authorization (EUA). This EUA will remain  in effect (meaning this test can be used) for the duration of the COVID-19 declaration under Section 564(b)(1) of the Act, 21 U.S.C.section 360bbb-3(b)(1), unless the authorization is terminated  or revoked sooner.       Influenza A by PCR NEGATIVE NEGATIVE Final   Influenza B by PCR NEGATIVE NEGATIVE Final    Comment: (NOTE) The Xpert Xpress SARS-CoV-2/FLU/RSV plus assay is intended as an aid in the diagnosis of influenza from Nasopharyngeal swab specimens and should not be used as a sole basis for treatment. Nasal washings and aspirates are unacceptable for Xpert Xpress SARS-CoV-2/FLU/RSV testing.  Fact Sheet for Patients: EntrepreneurPulse.com.au  Fact Sheet for Healthcare Providers: IncredibleEmployment.be  This test is not yet approved or cleared by the [place] FDA and has been authorized for detection and/or diagnosis of SARS-CoV-2 by FDA under an Emergency Use Authorization (EUA). This EUA will remain in effect (meaning this test can be used) for the duration of the COVID-19 declaration under Section 564(b)(1) of the Act, 21 U.S.C. section 360bbb-3(b)(1), unless the authorization is terminated or revoked.  Performed at [facility], [place] [address] [place], [place] [address]      Labs: BNP (last 3 results) Recent Labs    02/07/21 1210  BNP 60.4   Basic Metabolic Panel: Recent Labs  Lab 02/07/21 0820 02/09/21 0351  NA 138 137  K 4.9 4.0  CL 105 107  CO2 27 22   GLUCOSE 165* 127*  BUN 28* 16  CREATININE 1.00 0.91  CALCIUM 9.7 9.3   Liver Function Tests: No results for input(s): AST, ALT, ALKPHOS, BILITOT, PROT, ALBUMIN in the last 168 hours. Recent Labs  Lab 02/07/21 0626  LIPASE 29   No results for input(s): AMMONIA in the last 168 hours. CBC: Recent Labs  Lab 02/07/21 0625 02/08/21 0129 02/09/21 0351  WBC 6.7 6.8 6.5  HGB 15.5 14.5 14.2  HCT 47.2 43.2 42.5  MCV 86.9 86.9 86.4  PLT 419* 381 344   Cardiac Enzymes: No results for input(s): CKTOTAL, CKMB, CKMBINDEX, TROPONINI in the last 168 hours. BNP: Invalid input(s): POCBNP CBG: Recent Labs  Lab 02/08/21 0556 02/08/21 1147 02/08/21 1742 02/08/21 2154 02/09/21 0735  GLUCAP 148* 166* 109* 244* 148*   D-Dimer No results for input(s): DDIMER in the last 72 hours. Hgb A1c Recent Labs    02/08/21 0129  HGBA1C 7.0*  Lipid Profile Recent Labs    02/08/21 0129  CHOL 132  HDL 39*  LDLCALC 62  TRIG 156*  CHOLHDL 3.4   Thyroid function studies No results for input(s): TSH, T4TOTAL, T3FREE, THYROIDAB in the last 72 hours.  Invalid input(s): FREET3 Anemia work up No results for input(s): VITAMINB12, FOLATE, FERRITIN, TIBC, IRON, RETICCTPCT in the last 72 hours. Urinalysis    Component Value Date/Time   COLORURINE AMBER (A) 03/31/2015 2031   APPEARANCEUR CLEAR 03/31/2015 2031   LABSPEC 1.016 03/31/2015 2031   PHURINE 6.5 03/31/2015 2031   GLUCOSEU NEGATIVE 03/31/2015 2031   HGBUR NEGATIVE 03/31/2015 2031   BILIRUBINUR NEGATIVE 03/31/2015 2031   KETONESUR NEGATIVE 03/31/2015 2031   PROTEINUR NEGATIVE 03/31/2015 2031   UROBILINOGEN 1.0 03/31/2015 2031   NITRITE NEGATIVE 03/31/2015 2031   LEUKOCYTESUR NEGATIVE 03/31/2015 2031   Sepsis Labs Invalid input(s): PROCALCITONIN,  WBC,  LACTICIDVEN Microbiology Recent Results (from the past 240 hour(s))  Resp Panel by RT-PCR (Flu A&B, Covid) Nasopharyngeal Swab     Status: None   Collection Time: 02/07/21 10:08  AM   Specimen: Nasopharyngeal Swab; Nasopharyngeal(NP) swabs in vial transport medium  Result Value Ref Range Status   SARS Coronavirus 2 by RT PCR NEGATIVE NEGATIVE Final    Comment: (NOTE) SARS-CoV-2 target nucleic acids are NOT DETECTED.  The SARS-CoV-2 RNA is generally detectable in upper respiratory specimens during the acute phase of infection. The lowest concentration of SARS-CoV-2 viral copies this assay can detect is 138 copies/mL. A negative result does not preclude SARS-Cov-2 infection and should not be used as the sole basis for treatment or other patient management decisions. A negative result may occur with  improper specimen collection/handling, submission of specimen other than nasopharyngeal swab, presence of viral mutation(s) within the areas targeted by this assay, and inadequate number of viral copies(<138 copies/mL). A negative result must be combined with clinical observations, patient history, and epidemiological information. The expected result is Negative.  Fact Sheet for Patients:  EntrepreneurPulse.com.au  Fact Sheet for Healthcare Providers:  IncredibleEmployment.be  This test is no t yet approved or cleared by the [place] FDA and  has been authorized for detection and/or diagnosis of SARS-CoV-2 by FDA under an Emergency Use Authorization (EUA). This EUA will remain  in effect (meaning this test can be used) for the duration of the COVID-19 declaration under Section 564(b)(1) of the Act, 21 U.S.C.section 360bbb-3(b)(1), unless the authorization is terminated  or revoked sooner.       Influenza A by PCR NEGATIVE NEGATIVE Final   Influenza B by PCR NEGATIVE NEGATIVE Final    Comment: (NOTE) The Xpert Xpress SARS-CoV-2/FLU/RSV plus assay is intended as an aid in the diagnosis of influenza from Nasopharyngeal swab specimens and should not be used as a sole basis for treatment. Nasal washings and aspirates are  unacceptable for Xpert Xpress SARS-CoV-2/FLU/RSV testing.  Fact Sheet for Patients: EntrepreneurPulse.com.au  Fact Sheet for Healthcare Providers: IncredibleEmployment.be  This test is not yet approved or cleared by the [place] FDA and has been authorized for detection and/or diagnosis of SARS-CoV-2 by FDA under an Emergency Use Authorization (EUA). This EUA will remain in effect (meaning this test can be used) for the duration of the COVID-19 declaration under Section 564(b)(1) of the Act, 21 U.S.C. section 360bbb-3(b)(1), unless the authorization is terminated or revoked.  Performed at [facility], [place] [address] [place], [place] [address]      Time coordinating discharge:  35 minutes  SIGNED:   Barb Merino, MD  [facility] 02/09/2021, 10:44 AM

## 2021-02-10 ENCOUNTER — Telehealth (HOSPITAL_COMMUNITY): Payer: Self-pay

## 2021-02-10 NOTE — Telephone Encounter (Signed)
Per phase I, fax cardiac rehab referral to [facility].  

## 2021-02-12 NOTE — Telephone Encounter (Signed)
Left a message for the pt to call back. He has follow up 03/15/21 with Melina Copa PA. Will be a Dr. Marlou Porch pt.

## 2021-02-17 NOTE — Telephone Encounter (Signed)
Patient was called to follow up in regards to stomach pains and belching he had previously reported.  Patient reports improvement on the above issues.  Patient reports increased fatigue after starting new blood pressure medications.  Patient was asked if he is taking his blood pressure and he stated "no".  This RN advised to taked blood pressure an hour after taking medications and make a record of those readings and bring to his visit 5/3 with Richardson Dopp.  Patient verbalized understanding and will take his blood pressure.

## 2021-02-18 ENCOUNTER — Other Ambulatory Visit: Payer: Self-pay | Admitting: *Deleted

## 2021-02-18 NOTE — Progress Notes (Signed)
D/c metoprolol d/t fatigue.  Removed from medication list.

## 2021-02-23 DIAGNOSIS — I2510 Atherosclerotic heart disease of native coronary artery without angina pectoris: Secondary | ICD-10-CM | POA: Diagnosis not present

## 2021-02-23 DIAGNOSIS — I209 Angina pectoris, unspecified: Secondary | ICD-10-CM | POA: Diagnosis not present

## 2021-02-23 DIAGNOSIS — R5381 Other malaise: Secondary | ICD-10-CM | POA: Diagnosis not present

## 2021-03-02 DIAGNOSIS — H401131 Primary open-angle glaucoma, bilateral, mild stage: Secondary | ICD-10-CM | POA: Diagnosis not present

## 2021-03-02 DIAGNOSIS — H524 Presbyopia: Secondary | ICD-10-CM | POA: Diagnosis not present

## 2021-03-02 DIAGNOSIS — Z961 Presence of intraocular lens: Secondary | ICD-10-CM | POA: Diagnosis not present

## 2021-03-02 DIAGNOSIS — E119 Type 2 diabetes mellitus without complications: Secondary | ICD-10-CM | POA: Diagnosis not present

## 2021-03-04 DIAGNOSIS — E782 Mixed hyperlipidemia: Secondary | ICD-10-CM | POA: Diagnosis not present

## 2021-03-04 DIAGNOSIS — I10 Essential (primary) hypertension: Secondary | ICD-10-CM | POA: Diagnosis not present

## 2021-03-04 DIAGNOSIS — E1169 Type 2 diabetes mellitus with other specified complication: Secondary | ICD-10-CM | POA: Diagnosis not present

## 2021-03-04 DIAGNOSIS — Z8546 Personal history of malignant neoplasm of prostate: Secondary | ICD-10-CM | POA: Diagnosis not present

## 2021-03-04 DIAGNOSIS — C61 Malignant neoplasm of prostate: Secondary | ICD-10-CM | POA: Diagnosis not present

## 2021-03-04 DIAGNOSIS — L4050 Arthropathic psoriasis, unspecified: Secondary | ICD-10-CM | POA: Diagnosis not present

## 2021-03-04 DIAGNOSIS — I2510 Atherosclerotic heart disease of native coronary artery without angina pectoris: Secondary | ICD-10-CM | POA: Diagnosis not present

## 2021-03-04 DIAGNOSIS — M179 Osteoarthritis of knee, unspecified: Secondary | ICD-10-CM | POA: Diagnosis not present

## 2021-03-04 DIAGNOSIS — G4700 Insomnia, unspecified: Secondary | ICD-10-CM | POA: Diagnosis not present

## 2021-03-08 NOTE — Progress Notes (Signed)
Cardiology Office Note:    Date:  03/09/2021   ID:  Justin Cohen, DOB Aug 06, 1946, MRN [id number]  PCP:  Justin Contras, MD   [facility] Providers Cardiologist:  Justin Furbish, MD     Referring MD: Justin Contras, MD   Chief Complaint:  Hospitalization Follow-up (Unstable angina; s/p DES to LAD )    Patient Profile:    Justin Cohen is a 75 y.o. male with:   Coronary artery disease   S/p DES to pLAD 4/22  Ischemic CM  Echocardiogram 4/22: EF 45-50, ant-sept and apical AK, inf-sept and inf HK  Intol of beta-blockers due to fatigue   Diabetes mellitus   Prostate CA  Hypertension   Hyperlipidemia   GERD  Prior CV studies:  LEFT HEART CATH 02/08/2021 Narrative  Ost LAD to Prox LAD lesion is 95% stenosed.  A drug-eluting stent was successfully placed using a SYNERGY XD 3.50X16.  Post intervention, there is a 0% residual stenosis.  Prox RCA lesion is 50% stenosed.  Mid LM to Dist LM lesion is 30% stenosed. 1.  Critical ostial/proximal LAD stenosis, treated successfully with PCI using a 3.5 x 16 mm Synergy DES 2.  Patent left main and left circumflex with mild nonobstructive stenosis 3.  Nonobstructive mid RCA stenosis, estimated at 40 to 50%  Recommend: Dual antiplatelet therapy with aspirin and ticagrelor x12 months (ACS class I recommendation), aggressive medical therapy and risk reduction.  As long as no complications arise, patient eligible for hospital discharge tomorrow morning.   Echocardiogram 02/07/21 EF 45-50, ant-sept and apical AK, inf-sept and inf HK, mild LVH, Gr 1 DD, normal RVSF, AV sclerosis without stenosis    History of Present Illness: Justin Cohen was admitted 4/3-4/5 with unstable angina. His hs-Trop levels were minimally elevated without a clear trend.  Echocardiogram demonstrated EF 45-50 with anteroseptal and apical AK and inf-sept and inf HK. Cardiac catheterization demonstrated critical ostial LAD disease which was treated with  a DES.  Post PCI course was uneventful.  Since DC, he called in with symptoms of fatigue and his beta-blocker was DC'd.  He returns for f/u.    He is here alone today.  He has been doing well since DC.  He had severe abdominal pain/indigestion and slight chest pressure when he presented to the hospital.  He has not had severe symptoms since DC.  He has occasional indigestion now.  He has not had shortness of breath, syncope, orthopnea, leg edema.  BPs at home are higher.    Past Medical History:  Diagnosis Date  . Arthritis   . Basal cell carcinoma of nose   . CAD (coronary artery disease)    Admx with [place] 4/22 >> S/p DES to pLAD 4/22  . Complication of anesthesia   . Diabetes Mellitus Type 2    TYPE 2   . Environmental allergies   . Gastroesophageal reflux disease   . Glaucoma   . Hyperlipidemia   . Hypertension   . Ischemic cardiomyopathy    Echocardiogram 4/22: EF 45-50, ant-sept and apical AK, inf-sept and inf HK  . Melanoma ([place])   . Prostate cancer ([place])   . Sinus problem   . Sleep apnea    cpap- setting at 10     Current Medications: Current Meds  Medication Sig  . aspirin 81 MG chewable tablet Chew 1 tablet (81 mg total) by mouth daily.  . cetirizine (ZYRTEC) 10 MG tablet Take 10 mg by mouth daily.  Marland Kitchen  Cholecalciferol (VITAMIN D) 2000 UNITS tablet Take 2,000 Units by mouth daily.  . fluticasone (FLONASE) 50 MCG/ACT nasal spray Place 2 sprays into both nostrils daily.  Marland Kitchen losartan (COZAAR) 100 MG tablet Take 1 tablet (100 mg total) by mouth daily.  . metFORMIN (GLUCOPHAGE) 1000 MG tablet Take 1,000 mg by mouth 2 (two) times daily with a meal.  . Multiple Vitamin (MULTIVITAMIN WITH MINERALS) TABS tablet Take 1 tablet by mouth daily.   . nitroGLYCERIN (NITROSTAT) 0.4 MG SL tablet Place 1 tablet (0.4 mg total) under the tongue every 5 (five) minutes as needed for chest pain.  . Omega-3 Fatty Acids (FISH OIL) 1000 MG CAPS Take 1,000 mg by mouth daily.   . pantoprazole  (PROTONIX) 40 MG tablet Take 1 tablet (40 mg total) by mouth daily.  . pioglitazone (ACTOS) 45 MG tablet Take 45 mg by mouth daily.   . Probiotic Product (PROBIOTIC DAILY PO) Take 1 capsule by mouth daily.  . rosuvastatin (CRESTOR) 20 MG tablet Take 5 mg by mouth every evening.   . venlafaxine XR (EFFEXOR-XR) 75 MG 24 hr capsule Take 75 mg by mouth daily with breakfast.   . [DISCONTINUED] losartan (COZAAR) 50 MG tablet Take 2 tablets (100 mg total) by mouth daily.  . [DISCONTINUED] ticagrelor (BRILINTA) 90 MG TABS tablet Take 1 tablet (90 mg total) by mouth 2 (two) times daily.     Allergies:   Beta adrenergic blockers   Social History   Tobacco Use  . Smoking status: Former Smoker    Types: Cigarettes    Quit date: 03/05/1973    Years since quitting: 48.0  . Smokeless tobacco: Never Used  Vaping Use  . Vaping Use: Never used  Substance Use Topics  . Alcohol use: Yes    Comment: occasional glass of wine   . Drug use: No     Family Hx: The patient's family history includes CAD in his father; Heart failure in his mother; Lung cancer in his sister.  ROS   EKGs/Labs/Other Test Reviewed:    EKG:  EKG is   ordered today.  The ekg ordered today demonstrates normal sinus rhythm, HR 77, normal axis, septal Qs, non-specific ST-TW changes, QTc 441 ms  Recent Labs: 02/07/2021: B Natriuretic Peptide 35.1 02/09/2021: BUN 16; Creatinine, Ser 0.91; Hemoglobin 14.2; Platelets 344; Potassium 4.0; Sodium 137   Recent Lipid Panel Lab Results  Component Value Date/Time   CHOL 132 02/08/2021 01:29 AM   TRIG 156 (H) 02/08/2021 01:29 AM   HDL 39 (L) 02/08/2021 01:29 AM   CHOLHDL 3.4 02/08/2021 01:29 AM   LDLCALC 62 02/08/2021 01:29 AM      Risk Assessment/Calculations:      Physical Exam:    VS:  BP 110/62   Pulse 77   Ht 5\' 8"  (1.727 m)   Wt 246 lb 9.6 oz (111.9 kg)   SpO2 97%   BMI 37.50 kg/m     Wt Readings from Last 3 Encounters:  03/09/21 246 lb 9.6 oz (111.9 kg)  02/09/21  244 lb 9.6 oz (110.9 kg)  11/21/17 281 lb (127.5 kg)     Constitutional:      Appearance: Healthy appearance. Not in distress.  Neck:     Vascular: JVD normal.  Pulmonary:     Effort: Pulmonary effort is normal.     Breath sounds: No wheezing. No rales.  Cardiovascular:     Normal rate. Regular rhythm. Normal S1. Normal S2.     Murmurs: There  is no murmur.     Comments: R wrist without hematoma Edema:    Peripheral edema absent.  Abdominal:     General: There is no distension.     Palpations: Abdomen is soft.  Skin:    General: Skin is warm and dry.  Neurological:     General: No focal deficit present.     Mental Status: Alert and oriented to person, place and time.     Cranial Nerves: Cranial nerves are intact.          ASSESSMENT & PLAN:    1. Coronary artery disease involving native coronary artery of native heart without angina pectoris He was recently admitted with Unstable angina and was tx with DES to the LAD.  He has residual mild non-obstructive disease in the LM and LCx and mod non-obstructive disease in the RCA. He is doing well without anginal symptoms.  He still has some occasion indigestion-like symptoms.  But, this seems to be true GER symptoms and not his anginal equivalent.  He could not tolerate beta-blocker Rx due to fatigue.  Continue current dose of Ticagrelor, ASA, Rosuvastatin.  He is not interested in cardiac rehabilitation due to cost and schedule conflicts.  F/u with Dr. Marlou Porch in 3 mos.   2. Ischemic cardiomyopathy EF 45-50 with ant-sept and apical inf AK and inf-sept and inf HK.  He is NYHA II.  No signs or symptoms of volume excess.  This will likely improve post PCI.  Continue ARB. He cannot tolerate beta-blocker Rx due to fatigue.  Continue current medications.   3. Essential hypertension The patient's blood pressure is controlled on his current regimen.  Continue current therapy.  His BPs are higher at home.  His BP cuff is too small.  I also  reminded him to check his BP after resting for 15 mins and no stimulants (ie - no caffeine) prior to checking it.  I repeated his BP in the office and it was 120/76.  He has lost 40+ lbs over the past 6 mos which will help with BP management.    4. Mixed hyperlipidemia LDL optimal. Given his recent presentation with [place] and need for PCI, I did consider increasing his Rosuvastatin to 10 mg once daily.  However, he notes 40+ lb weight loss.  His A1c has continued to improve since he started to lose weight.  He has labs in 04/2021.  If LDL close to 70 or higher, it may be worthwhile to increase Rosuvastatin to 10 mg once daily.  Of note, ESC guidelines are to get LDL < 55. This may be a reasonable goal for him.         Dispo:  Return in about 3 months (around 06/09/2021) for Routine 3 month follow up with Dr.Skains. .   Medication Adjustments/Labs and Tests Ordered: Current medicines are reviewed at length with the patient today.  Concerns regarding medicines are outlined above.  Tests Ordered: Orders Placed This Encounter  Procedures  . EKG 12-Lead   Medication Changes: Meds ordered this encounter  Medications  . ticagrelor (BRILINTA) 90 MG TABS tablet    Sig: Take 1 tablet (90 mg total) by mouth 2 (two) times daily.    Dispense:  180 tablet    Refill:  3  . losartan (COZAAR) 100 MG tablet    Sig: Take 1 tablet (100 mg total) by mouth daily.    Dispense:  90 tablet    Refill:  3    Signed, Toris Laverdiere  Jorene Minors  03/09/2021 12:44 PM    [facility] [place], [place], [place]  [address] Phone: [phone]; Fax: [phone]

## 2021-03-09 ENCOUNTER — Encounter: Payer: Self-pay | Admitting: Physician Assistant

## 2021-03-09 ENCOUNTER — Ambulatory Visit: Payer: Medicare HMO | Admitting: Physician Assistant

## 2021-03-09 ENCOUNTER — Other Ambulatory Visit: Payer: Self-pay

## 2021-03-09 VITALS — BP 110/62 | HR 77 | Ht 68.0 in | Wt 246.6 lb

## 2021-03-09 DIAGNOSIS — I255 Ischemic cardiomyopathy: Secondary | ICD-10-CM | POA: Diagnosis not present

## 2021-03-09 DIAGNOSIS — E782 Mixed hyperlipidemia: Secondary | ICD-10-CM

## 2021-03-09 DIAGNOSIS — I2510 Atherosclerotic heart disease of native coronary artery without angina pectoris: Secondary | ICD-10-CM

## 2021-03-09 DIAGNOSIS — I10 Essential (primary) hypertension: Secondary | ICD-10-CM

## 2021-03-09 MED ORDER — LOSARTAN POTASSIUM 100 MG PO TABS: 100.0000 mg | ORAL_TABLET | Freq: Every day | ORAL | 3 refills | Status: DC

## 2021-03-09 MED ORDER — TICAGRELOR 90 MG PO TABS: 90.0000 mg | ORAL_TABLET | Freq: Two times a day (BID) | ORAL | 3 refills | Status: AC

## 2021-03-09 NOTE — Patient Instructions (Signed)
Medication Instructions:   Your physician recommends that you continue on your current medications as directed. Please refer to the Current Medication list given to you today.  Your medications were sent in today # 55 to [facility] mail order   If you need a refill on your cardiac medications before your next appointment, please call your pharmacy*   Lab Work: -NONE   If you have labs (blood work) drawn today and your tests are completely normal, you will receive your results only by: Marland Kitchen MyChart Message (if you have MyChart) OR . A paper copy in the mail If you have any lab test that is abnormal or we need to change your treatment, we will call you to review the results.   Testing/Procedures: -NONE   Follow-Up: At [facility], you and your health needs are our priority.  As part of our continuing mission to provide you with exceptional heart care, we have created designated Provider Care Teams.  These Care Teams include your primary Cardiologist (physician) and Advanced Practice Providers (APPs -  Physician Assistants and Nurse Practitioners) who all work together to provide you with the care you need, when you need it.  We recommend signing up for the patient portal called "MyChart".  Sign up information is provided on this After Visit Summary.  MyChart is used to connect with patients for Virtual Visits (Telemedicine).  Patients are able to view lab/test results, encounter notes, upcoming appointments, etc.  Non-urgent messages can be sent to your provider as well.   To learn more about what you can do with MyChart, go to [email or web address].    Your next appointment:   3 month(s) with Dr.Skains on Friday, August 5 @ 11:40 am.   The format for your next appointment:   In Person  Provider:   Candee Furbish, MD   Other Instructions -NONE

## 2021-03-11 DIAGNOSIS — G4733 Obstructive sleep apnea (adult) (pediatric): Secondary | ICD-10-CM | POA: Diagnosis not present

## 2021-03-15 ENCOUNTER — Ambulatory Visit: Payer: Medicare HMO | Admitting: Physician Assistant

## 2021-03-18 DIAGNOSIS — B372 Candidiasis of skin and nail: Secondary | ICD-10-CM | POA: Diagnosis not present

## 2021-03-29 DIAGNOSIS — L309 Dermatitis, unspecified: Secondary | ICD-10-CM | POA: Diagnosis not present

## 2021-03-29 DIAGNOSIS — R21 Rash and other nonspecific skin eruption: Secondary | ICD-10-CM | POA: Diagnosis not present

## 2021-04-11 DIAGNOSIS — G4733 Obstructive sleep apnea (adult) (pediatric): Secondary | ICD-10-CM | POA: Diagnosis not present

## 2021-04-15 DIAGNOSIS — J302 Other seasonal allergic rhinitis: Secondary | ICD-10-CM | POA: Diagnosis not present

## 2021-04-15 DIAGNOSIS — Z0000 Encounter for general adult medical examination without abnormal findings: Secondary | ICD-10-CM | POA: Diagnosis not present

## 2021-04-15 DIAGNOSIS — E7800 Pure hypercholesterolemia, unspecified: Secondary | ICD-10-CM | POA: Diagnosis not present

## 2021-04-15 DIAGNOSIS — K130 Diseases of lips: Secondary | ICD-10-CM | POA: Diagnosis not present

## 2021-04-15 DIAGNOSIS — E1169 Type 2 diabetes mellitus with other specified complication: Secondary | ICD-10-CM | POA: Diagnosis not present

## 2021-04-15 DIAGNOSIS — I10 Essential (primary) hypertension: Secondary | ICD-10-CM | POA: Diagnosis not present

## 2021-04-15 DIAGNOSIS — Z8546 Personal history of malignant neoplasm of prostate: Secondary | ICD-10-CM | POA: Diagnosis not present

## 2021-04-15 DIAGNOSIS — F419 Anxiety disorder, unspecified: Secondary | ICD-10-CM | POA: Diagnosis not present

## 2021-04-15 DIAGNOSIS — Z1389 Encounter for screening for other disorder: Secondary | ICD-10-CM | POA: Diagnosis not present

## 2021-04-15 DIAGNOSIS — E1151 Type 2 diabetes mellitus with diabetic peripheral angiopathy without gangrene: Secondary | ICD-10-CM | POA: Diagnosis not present

## 2021-04-28 DIAGNOSIS — E119 Type 2 diabetes mellitus without complications: Secondary | ICD-10-CM | POA: Diagnosis not present

## 2021-04-28 DIAGNOSIS — E1169 Type 2 diabetes mellitus with other specified complication: Secondary | ICD-10-CM | POA: Diagnosis not present

## 2021-04-28 DIAGNOSIS — L4050 Arthropathic psoriasis, unspecified: Secondary | ICD-10-CM | POA: Diagnosis not present

## 2021-04-28 DIAGNOSIS — E7800 Pure hypercholesterolemia, unspecified: Secondary | ICD-10-CM | POA: Diagnosis not present

## 2021-04-28 DIAGNOSIS — I10 Essential (primary) hypertension: Secondary | ICD-10-CM | POA: Diagnosis not present

## 2021-04-28 DIAGNOSIS — E782 Mixed hyperlipidemia: Secondary | ICD-10-CM | POA: Diagnosis not present

## 2021-04-28 DIAGNOSIS — G4700 Insomnia, unspecified: Secondary | ICD-10-CM | POA: Diagnosis not present

## 2021-04-28 DIAGNOSIS — M179 Osteoarthritis of knee, unspecified: Secondary | ICD-10-CM | POA: Diagnosis not present

## 2021-04-28 DIAGNOSIS — I2510 Atherosclerotic heart disease of native coronary artery without angina pectoris: Secondary | ICD-10-CM | POA: Diagnosis not present

## 2021-05-11 DIAGNOSIS — G4733 Obstructive sleep apnea (adult) (pediatric): Secondary | ICD-10-CM | POA: Diagnosis not present

## 2021-05-14 ENCOUNTER — Other Ambulatory Visit: Payer: Self-pay

## 2021-05-17 DIAGNOSIS — L578 Other skin changes due to chronic exposure to nonionizing radiation: Secondary | ICD-10-CM | POA: Diagnosis not present

## 2021-05-17 DIAGNOSIS — D1801 Hemangioma of skin and subcutaneous tissue: Secondary | ICD-10-CM | POA: Diagnosis not present

## 2021-05-17 DIAGNOSIS — L439 Lichen planus, unspecified: Secondary | ICD-10-CM | POA: Diagnosis not present

## 2021-05-17 DIAGNOSIS — Z85828 Personal history of other malignant neoplasm of skin: Secondary | ICD-10-CM | POA: Diagnosis not present

## 2021-05-17 DIAGNOSIS — L570 Actinic keratosis: Secondary | ICD-10-CM | POA: Diagnosis not present

## 2021-05-18 DIAGNOSIS — E1169 Type 2 diabetes mellitus with other specified complication: Secondary | ICD-10-CM | POA: Diagnosis not present

## 2021-05-18 DIAGNOSIS — G4733 Obstructive sleep apnea (adult) (pediatric): Secondary | ICD-10-CM | POA: Diagnosis not present

## 2021-05-18 DIAGNOSIS — I10 Essential (primary) hypertension: Secondary | ICD-10-CM | POA: Diagnosis not present

## 2021-05-18 DIAGNOSIS — I2510 Atherosclerotic heart disease of native coronary artery without angina pectoris: Secondary | ICD-10-CM | POA: Diagnosis not present

## 2021-05-18 DIAGNOSIS — C61 Malignant neoplasm of prostate: Secondary | ICD-10-CM | POA: Diagnosis not present

## 2021-05-18 DIAGNOSIS — E119 Type 2 diabetes mellitus without complications: Secondary | ICD-10-CM | POA: Diagnosis not present

## 2021-05-18 DIAGNOSIS — M179 Osteoarthritis of knee, unspecified: Secondary | ICD-10-CM | POA: Diagnosis not present

## 2021-05-18 DIAGNOSIS — G4700 Insomnia, unspecified: Secondary | ICD-10-CM | POA: Diagnosis not present

## 2021-05-18 DIAGNOSIS — E782 Mixed hyperlipidemia: Secondary | ICD-10-CM | POA: Diagnosis not present

## 2021-05-18 DIAGNOSIS — E7800 Pure hypercholesterolemia, unspecified: Secondary | ICD-10-CM | POA: Diagnosis not present

## 2021-06-01 ENCOUNTER — Other Ambulatory Visit: Payer: Self-pay

## 2021-06-01 DIAGNOSIS — Z006 Encounter for examination for normal comparison and control in clinical research program: Secondary | ICD-10-CM

## 2021-06-01 NOTE — Research (Signed)
Subject # [id number] [employer] Lp(a) [id number] Site # [id number]  PXT [x]   Male                      []   Male  Ethnicity []  Hispanic or Latino   [x]  Not Hispanic or Latino  Race [x]  White                 []  Black or [place] American  []  [place] []  American [place] or [place] Native            []  [place] or Other [place]                      []  Other  Other   Age 75  Subject Group [x]   Local Lab           []  Historical Lp(a) value                                       Results:  Future research [x]   Yes                    []  No    [employer] [id number] Site # [id number] Subject ID # [id number]         ELIGIBILITY CRITERIA WORKSHEET INCLUSION CRITERIA   Subject has provided informed consent prior to the initiation of any study specific activities/procedures [x]  Age 72 to 79 years [x]  MI (presumed type 1) OR []  PCI (with high-risk features) with at least 1 of the following: [x]  Age >34 [x]  Diabetes mellitus  HbA1c: 7.0 [x]  History of ischemic stroke []  History of peripheral arterial disease []  Residual stenosis ? 50% []  Multivessel PCI (ie, ? 2 vessels, including branch arteries []  EXCLUSIONS THE FOLLOWING N/A [x]  Subjects known to be currently receiving investigational drug in a clinical study that is anticipated to last > 1 year []  Known Lp(a) value <80m/dL or < 200nmol/L []  Subject has a diagnosis of end-stage renal disease or requires dialysis. []  Poorly controlled (glycated hemoglobin [HbA1c] > 10%) diabetes mellitus (type 1 or type 2) []  Subject is receiving or has received lipoprotein apheresis to reduce Lp(a) within 3 months prior to enrollment. []  Known uncontrolled or recurrent ventricular tachycardia in the past 3 months prior to enrollment. []  Known malignancy (except non-melanoma skin cancers, cervical in situ carcinoma, breast ductal carcinoma in situ, or stage 1 prostate carcinoma) within the last 5 years prior to enrollment. []  Known  history or evidence of clinically significant disease (eg, respiratory, gastrointestinal, or psychiatric disease) or unstable disorder or biomarker that, in the opinion of the investigator(s), would result in life expectancy < 5 years. []  Known hemorrhagic stroke. []   [employer] Lp(a) Informed Consent   Subject Name: Justin Cohen Subject met inclusion and exclusion criteria.  The informed consent form, study requirements and expectations were reviewed with the subject and questions and concerns were addressed prior to the signing of the consent form.  The subject verbalized understanding of the trial requirements.  The subject agreed to participate in the [facility] and signed the informed consent at 1056 on 06/01/21.  The informed consent was obtained prior  to performance of any protocol-specific procedures for the subject.  A copy of the signed informed consent was given to the subject and a copy was placed in the subject's medical record.   Sharon M Turney  [employer] Consent Version 2 Protocol Version 2  

## 2021-06-02 LAB — LIPOPROTEIN A (LPA): Lipoprotein (a): 36.8 nmol/L (ref <75.0)

## 2021-06-11 ENCOUNTER — Other Ambulatory Visit: Payer: Self-pay

## 2021-06-11 ENCOUNTER — Ambulatory Visit: Payer: Medicare HMO | Admitting: Cardiology

## 2021-06-11 ENCOUNTER — Encounter: Payer: Self-pay | Admitting: Cardiology

## 2021-06-11 VITALS — BP 142/78 | HR 80 | Ht 68.0 in | Wt 240.0 lb

## 2021-06-11 DIAGNOSIS — I2510 Atherosclerotic heart disease of native coronary artery without angina pectoris: Secondary | ICD-10-CM | POA: Diagnosis not present

## 2021-06-11 DIAGNOSIS — I255 Ischemic cardiomyopathy: Secondary | ICD-10-CM

## 2021-06-11 DIAGNOSIS — E782 Mixed hyperlipidemia: Secondary | ICD-10-CM

## 2021-06-11 DIAGNOSIS — G4733 Obstructive sleep apnea (adult) (pediatric): Secondary | ICD-10-CM | POA: Diagnosis not present

## 2021-06-11 NOTE — Patient Instructions (Signed)

## 2021-06-11 NOTE — Progress Notes (Signed)
Cardiology Office Note:    Date:  06/11/2021   ID:  Justin Cohen, DOB 03-Oct-1946, MRN [id number]  PCP:  Antony Contras, MD   [facility] Providers Cardiologist:  Candee Furbish, MD     Referring MD: Antony Contras, MD    History of Present Illness:    Justin Cohen is a 75 y.o. male here for the follow-up of coronary artery disease status post DES to LAD.  Cardiac catheterization reviewed from April 2022.  Proximal LAD DES.  Also had ischemic cardiomyopathy with EF of 45 to 50% mildly reduced with inferior septal and inferior hypowall hypokinesis.  Intolerant of beta-blocker secondary to fatigue.  Also has diabetes hypertension hyperlipidemia and prostate cancer.  Past Medical History:  Diagnosis Date   Arthritis    Basal cell carcinoma of nose    CAD (coronary artery disease)    Admx with [place] 4/22 >> S/p DES to pLAD [id number]   Complication of anesthesia    Diabetes Mellitus Type 2    TYPE 2    Environmental allergies    Gastroesophageal reflux disease    Glaucoma    Hyperlipidemia    Hypertension    Ischemic cardiomyopathy    Echocardiogram 4/22: EF 45-50, ant-sept and apical AK, inf-sept and inf HK   Melanoma ([place])    Prostate cancer ([place])    Sinus problem    Sleep apnea    cpap- setting at 10     Past Surgical History:  Procedure Laterality Date   BUNIONECTOMY  2014   right foot   CHOLECYSTECTOMY N/A 03/12/2015   Procedure: LAPAROSCOPIC CHOLECYSTECTOMY CHOLANGIOGRAM WAS NOT PERFORMED;  Surgeon: Ralene Ok, MD;  Location: [facility];  Service: General;  Laterality: N/A;   CORONARY BALLOON ANGIOPLASTY N/A 02/08/2021   Procedure: CORONARY BALLOON ANGIOPLASTY;  Surgeon: Sherren Mocha, MD;  Location: [place] [facility];  Service: Cardiovascular;  Laterality: N/A;   ERCP N/A 03/08/2015   Procedure: ENDOSCOPIC RETROGRADE CHOLANGIOPANCREATOGRAPHY (ERCP);  Surgeon: Inda Castle, MD;  Location: [facility];  Service: Gastroenterology;  Laterality: N/A;   ERCP N/A 03/10/2015    Procedure: ENDOSCOPIC RETROGRADE CHOLANGIOPANCREATOGRAPHY (ERCP);  Surgeon: Clarene Essex, MD;  Location: [facility];  Service: Endoscopy;  Laterality: N/A;   ERCP N/A 05/21/2015   Procedure: ENDOSCOPIC RETROGRADE CHOLANGIOPANCREATOGRAPHY (ERCP) with spyglass and stent removal;  Surgeon: Clarene Essex, MD;  Location: [facility];  Service: Endoscopy;  Laterality: N/A;   HERNIA REPAIR     HERNIA REPAIR  2010   JOINT REPLACEMENT  02/17/15   left knee replacement   JOINT REPLACEMENT  2001   right knee replacement   JOINT REPLACEMENT  2012   "right knee cap replacement"   KNEE SURGERY     X 2   LEFT HEART CATH AND CORONARY ANGIOGRAPHY N/A 02/08/2021   Procedure: LEFT HEART CATH AND CORONARY ANGIOGRAPHY;  Surgeon: Sherren Mocha, MD;  Location: [place] [facility];  Service: Cardiovascular;  Laterality: N/A;   melanoma removal  Dec 2015   removed from [place]  2009   due to prostate cancer   TOTAL HIP ARTHROPLASTY Right 11/21/2017   Procedure: RIGHT TOTAL HIP ARTHROPLASTY ANTERIOR APPROACH;  Surgeon: Paralee Cancel, MD;  Location: [facility];  Service: Orthopedics;  Laterality: Right;  70 mins   TOTAL KNEE ARTHROPLASTY Left 02/17/2015   Procedure: LEFT TOTAL KNEE ARTHROPLASTY;  Surgeon: Paralee Cancel, MD;  Location: [facility];  Service: Orthopedics;  Laterality: Left;  Current Medications: Current Meds  Medication Sig   cetirizine (ZYRTEC) 10 MG tablet Take 10 mg by mouth daily.   Cholecalciferol (VITAMIN D) 2000 UNITS tablet Take 2,000 Units by mouth daily.   fluticasone (FLONASE) 50 MCG/ACT nasal spray Place 2 sprays into both nostrils daily.   losartan (COZAAR) 100 MG tablet Take 1 tablet (100 mg total) by mouth daily.   metFORMIN (GLUCOPHAGE) 1000 MG tablet Take 1,000 mg by mouth 2 (two) times daily with a meal.   Multiple Vitamin (MULTIVITAMIN WITH MINERALS) TABS tablet Take 1 tablet by mouth daily.    nitroGLYCERIN (NITROSTAT) 0.4 MG SL tablet Place 1 tablet  (0.4 mg total) under the tongue every 5 (five) minutes as needed for chest pain.   Omega-3 Fatty Acids (FISH OIL) 1000 MG CAPS Take 1,000 mg by mouth daily.    pioglitazone (ACTOS) 45 MG tablet Take 45 mg by mouth daily.    Probiotic Product (PROBIOTIC DAILY PO) Take 1 capsule by mouth daily.   rosuvastatin (CRESTOR) 20 MG tablet Take 5 mg by mouth every evening.    ticagrelor (BRILINTA) 90 MG TABS tablet Take 90 mg by mouth 2 (two) times daily.   venlafaxine XR (EFFEXOR-XR) 75 MG 24 hr capsule Take 75 mg by mouth daily with breakfast.      Allergies:   Beta adrenergic blockers   Social History   Socioeconomic History   Marital status: Married    Spouse name: Not on file   Number of children: Not on file   Years of education: Not on file   Highest education level: Not on file  Occupational History   Not on file  Tobacco Use   Smoking status: Former    Types: Cigarettes    Quit date: 03/05/1973    Years since quitting: 48.3   Smokeless tobacco: Never  Vaping Use   Vaping Use: Never used  Substance and Sexual Activity   Alcohol use: Yes    Comment: occasional glass of wine    Drug use: No   Sexual activity: Not on file  Other Topics Concern   Not on file  Social History Narrative   ** Merged History Encounter **       Social Determinants of Health   Financial Resource Strain: Not on file  Food Insecurity: Not on file  Transportation Needs: Not on file  Physical Activity: Not on file  Stress: Not on file  Social Connections: Not on file     Family History: The patient's family history includes CAD in his father; Heart failure in his mother; Lung cancer in his sister.  ROS:   Please see the history of present illness.     All other systems reviewed and are negative.  EKGs/Labs/Other Studies Reviewed:    The following studies were reviewed today:    LEFT HEART CATH 02/08/2021 Narrative  Ost LAD to Prox LAD lesion is 95% stenosed.  A drug-eluting stent was  successfully placed using a SYNERGY XD 3.50X16.  Post intervention, there is a 0% residual stenosis.  Prox RCA lesion is 50% stenosed.  Mid LM to Dist LM lesion is 30% stenosed. 1.  Critical ostial/proximal LAD stenosis, treated successfully with PCI using a 3.5 x 16 mm Synergy DES 2.  Patent left main and left circumflex with mild nonobstructive stenosis 3.  Nonobstructive mid RCA stenosis, estimated at 40 to 50%   Recommend: Dual antiplatelet therapy with aspirin and ticagrelor x12 months (ACS class I recommendation), aggressive medical therapy  and risk reduction.  As long as no complications arise, patient eligible for hospital discharge tomorrow morning.    Echocardiogram 02/07/21 EF 45-50, ant-sept and apical AK, inf-sept and inf HK, mild LVH, Gr 1 DD, normal RVSF, AV sclerosis without stenosis    Recent Labs: 02/07/2021: B Natriuretic Peptide 35.1 02/09/2021: BUN 16; Creatinine, Ser 0.91; Hemoglobin 14.2; Platelets 344; Potassium 4.0; Sodium 137  Recent Lipid Panel    Component Value Date/Time   CHOL 132 02/08/2021 0129   TRIG 156 (H) 02/08/2021 0129   HDL 39 (L) 02/08/2021 0129   CHOLHDL 3.4 02/08/2021 0129   VLDL 31 02/08/2021 0129   LDLCALC 62 02/08/2021 0129     Risk Assessment/Calculations:          Physical Exam:    VS:  BP (!) 142/78   Pulse 80   Ht '5\' 8"'$  (1.727 m)   Wt 240 lb (108.9 kg)   SpO2 98%   BMI 36.49 kg/m     Wt Readings from Last 3 Encounters:  06/11/21 240 lb (108.9 kg)  03/09/21 246 lb 9.6 oz (111.9 kg)  02/09/21 244 lb 9.6 oz (110.9 kg)     GEN:  Well nourished, well developed in no acute distress HEENT: Normal NECK: No JVD; No carotid bruits LYMPHATICS: No lymphadenopathy CARDIAC: RRR, no murmurs, rubs, gallops RESPIRATORY:  Clear to auscultation without rales, wheezing or rhonchi  ABDOMEN: Soft, non-tender, non-distended MUSCULOSKELETAL:  No edema; No deformity  SKIN: Warm and dry NEUROLOGIC:  Alert and oriented x 3 PSYCHIATRIC:   Normal affect   ASSESSMENT:    1. Coronary artery disease involving native coronary artery of native heart without angina pectoris   2. Ischemic cardiomyopathy   3. Mixed hyperlipidemia    PLAN:    In order of problems listed above:  Coronary artery disease - Overall doing well post PCI 02/2021.  No chest pain.  He did have previously severe abdominal pain and indigestion.  No shortness of breath no syncope no orthopnea. - Drug-eluting stent to the LAD.  Residual mild nonobstructive disease in the left main and left circumflex.  As well as right coronary artery.  Plan is to continue with dual antiplatelet therapy for 12 months. - GERD symptoms occasionally. - Intolerant to beta-blocker secondary to fatigue. - Continuing with Brilinta aspirin Crestor.  Did not wish to perform cardiac rehab due to cost.  Ischemic cardiomyopathy - EF was 45 to 50% mildly reduced.  NYHA class II.  This will likely or has likely improved.  Essential hypertension  -Blood pressure mildly elevated today here in the clinic.  He lent his blood pressure cuff to his neighbor.  I have asked him to see if he can take a few blood pressures at home.  For now continue with losartan 100 mg a day.  6 month follow up      Medication Adjustments/Labs and Tests Ordered: Current medicines are reviewed at length with the patient today.  Concerns regarding medicines are outlined above.  No orders of the defined types were placed in this encounter.  No orders of the defined types were placed in this encounter.   Patient Instructions  Medication Instructions:  The current medical regimen is effective;  continue present plan and medications.  *If you need a refill on your cardiac medications before your next appointment, please call your pharmacy*  Follow-Up: At [facility], you and your health needs are our priority.  As part of our continuing mission to provide you  with exceptional heart care, we have created  designated Provider Care Teams.  These Care Teams include your primary Cardiologist (physician) and Advanced Practice Providers (APPs -  Physician Assistants and Nurse Practitioners) who all work together to provide you with the care you need, when you need it.  We recommend signing up for the patient portal called "MyChart".  Sign up information is provided on this After Visit Summary.  MyChart is used to connect with patients for Virtual Visits (Telemedicine).  Patients are able to view lab/test results, encounter notes, upcoming appointments, etc.  Non-urgent messages can be sent to your provider as well.   To learn more about what you can do with MyChart, go to [email or web address].    Your next appointment:   6 month(s)  The format for your next appointment:   In Person  Provider:   Candee Furbish, MD  Thank you for choosing [facility]!!     Signed, Candee Furbish, MD  06/11/2021 5:13 PM    [place]

## 2021-07-12 DIAGNOSIS — G4733 Obstructive sleep apnea (adult) (pediatric): Secondary | ICD-10-CM | POA: Diagnosis not present

## 2021-07-14 DIAGNOSIS — I209 Angina pectoris, unspecified: Secondary | ICD-10-CM | POA: Diagnosis not present

## 2021-07-14 DIAGNOSIS — L4050 Arthropathic psoriasis, unspecified: Secondary | ICD-10-CM | POA: Diagnosis not present

## 2021-07-14 DIAGNOSIS — I2510 Atherosclerotic heart disease of native coronary artery without angina pectoris: Secondary | ICD-10-CM | POA: Diagnosis not present

## 2021-07-14 DIAGNOSIS — E119 Type 2 diabetes mellitus without complications: Secondary | ICD-10-CM | POA: Diagnosis not present

## 2021-07-14 DIAGNOSIS — I10 Essential (primary) hypertension: Secondary | ICD-10-CM | POA: Diagnosis not present

## 2021-07-14 DIAGNOSIS — E1169 Type 2 diabetes mellitus with other specified complication: Secondary | ICD-10-CM | POA: Diagnosis not present

## 2021-07-14 DIAGNOSIS — E782 Mixed hyperlipidemia: Secondary | ICD-10-CM | POA: Diagnosis not present

## 2021-07-14 DIAGNOSIS — G4700 Insomnia, unspecified: Secondary | ICD-10-CM | POA: Diagnosis not present

## 2021-08-11 DIAGNOSIS — G4733 Obstructive sleep apnea (adult) (pediatric): Secondary | ICD-10-CM | POA: Diagnosis not present

## 2021-08-13 DIAGNOSIS — G4700 Insomnia, unspecified: Secondary | ICD-10-CM | POA: Diagnosis not present

## 2021-08-13 DIAGNOSIS — E1169 Type 2 diabetes mellitus with other specified complication: Secondary | ICD-10-CM | POA: Diagnosis not present

## 2021-08-13 DIAGNOSIS — E119 Type 2 diabetes mellitus without complications: Secondary | ICD-10-CM | POA: Diagnosis not present

## 2021-08-13 DIAGNOSIS — L4050 Arthropathic psoriasis, unspecified: Secondary | ICD-10-CM | POA: Diagnosis not present

## 2021-08-13 DIAGNOSIS — E782 Mixed hyperlipidemia: Secondary | ICD-10-CM | POA: Diagnosis not present

## 2021-08-13 DIAGNOSIS — I2510 Atherosclerotic heart disease of native coronary artery without angina pectoris: Secondary | ICD-10-CM | POA: Diagnosis not present

## 2021-08-13 DIAGNOSIS — I10 Essential (primary) hypertension: Secondary | ICD-10-CM | POA: Diagnosis not present

## 2021-08-13 DIAGNOSIS — I209 Angina pectoris, unspecified: Secondary | ICD-10-CM | POA: Diagnosis not present

## 2021-08-27 ENCOUNTER — Ambulatory Visit (INDEPENDENT_AMBULATORY_CARE_PROVIDER_SITE_OTHER): Payer: Medicare HMO | Admitting: Sports Medicine

## 2021-08-27 DIAGNOSIS — E119 Type 2 diabetes mellitus without complications: Secondary | ICD-10-CM | POA: Diagnosis not present

## 2021-08-27 DIAGNOSIS — M2042 Other hammer toe(s) (acquired), left foot: Secondary | ICD-10-CM | POA: Diagnosis not present

## 2021-08-27 DIAGNOSIS — M2041 Other hammer toe(s) (acquired), right foot: Secondary | ICD-10-CM

## 2021-08-27 DIAGNOSIS — M21619 Bunion of unspecified foot: Secondary | ICD-10-CM

## 2021-08-27 DIAGNOSIS — L84 Corns and callosities: Secondary | ICD-10-CM | POA: Diagnosis not present

## 2021-08-27 NOTE — Progress Notes (Signed)
Subjective: Justin Cohen is a 75 y.o. male patient with history of diabetes who presents to office today for diabetic foot exam and to discuss new diabetic shoes for the year. Patient reports that his blood sugars are doing good. A1c ranges 6.2-6.4. Reports that he does gets some soreness at the calluses at R>L 1st toes. Denies any other pedal complaints at this time.   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   Malnutrition of moderate degree ([place]) 04/02/2015   Abscess    Hyperlipidemia 03/31/2015   Pericholecystic abscess 03/31/2015   Postoperative wound abscess 03/31/2015   Cholecystitis, acute 03/13/2015   Sepsis due to Escherichia coli ([place]) 03/11/2015   Choledocholithiasis 03/08/2015   Cholangitis 03/06/2015   HTN (hypertension) 16/05/3709   DM w/o complication type II ([place]) 03/06/2015   Encephalopathy due to infection 03/06/2015   Sleep apnea 03/06/2015   Morbid obesity ([place]) 02/18/2015   S/P left TKA 02/17/2015   Current Outpatient Medications on File Prior to Visit  Medication Sig Dispense Refill   cetirizine (ZYRTEC) 10 MG tablet Take 10 mg by mouth daily.     Cholecalciferol (VITAMIN D) 2000 UNITS tablet Take 2,000 Units by mouth daily.     fluticasone (FLONASE) 50 MCG/ACT nasal spray Place 2 sprays into both nostrils daily.     losartan (COZAAR) 100 MG tablet Take 1 tablet (100 mg total) by mouth daily. 90 tablet 3   metFORMIN (GLUCOPHAGE) 1000 MG tablet Take 1,000 mg by mouth 2 (two) times daily with a meal.     Multiple Vitamin (MULTIVITAMIN WITH MINERALS) TABS tablet Take 1 tablet by mouth daily.      nitroGLYCERIN (NITROSTAT) 0.4 MG SL tablet Place 1 tablet (0.4 mg total) under the tongue every 5 (five) minutes as needed for chest pain. 25 tablet 12    Omega-3 Fatty Acids (FISH OIL) 1000 MG CAPS Take 1,000 mg by mouth daily.      pioglitazone (ACTOS) 45 MG tablet Take 45 mg by mouth daily.      Probiotic Product (PROBIOTIC DAILY PO) Take 1 capsule by mouth daily.     rosuvastatin (CRESTOR) 20 MG tablet Take 5 mg by mouth every evening.      ticagrelor (BRILINTA) 90 MG TABS tablet Take 90 mg by mouth 2 (two) times daily.     venlafaxine XR (EFFEXOR-XR) 75 MG 24 hr capsule Take 75 mg by mouth daily with breakfast.      No current facility-administered medications on file prior to visit.   Allergies  Allergen Reactions   Beta Adrenergic Blockers     Fatigue     Recent Results (from the past 2160 hour(s))  Lipoprotein A (LPA)     Status: None   Collection Time: 06/01/21 11:05 AM  Result Value Ref Range   Lipoprotein (a) 36.8 <75.0 nmol/L    Comment: Note:  Values greater than or equal to 75.0 nmol/L may        indicate an independent risk factor for CHD,        but must be evaluated with caution when applied        to non-Caucasian populations due to the        influence of genetic factors on Lp(a)  across        ethnicities.     Objective: General: Patient is awake, alert, and oriented x 3 and in no acute distress.  Integument: Skin is warm, dry and supple bilateral. Nails are short, thickened and dystrophic with subungual debris, consistent with onychomycosis, 1-5 bilateral. No signs of infection. No open lesions or preulcerative lesions present bilateral. Mild reactive callus at 1st toes and sub-met 1 on right. Remaining integument unremarkable.  Vasculature:  Dorsalis Pedis pulse 2/4 bilateral. Posterior Tibial pulse 1 /4 bilateral.  Capillary fill time <3 sec 1-5 bilateral. Positive hair growth to the level of the digits. Temperature gradient within normal limits. No varicosities present bilateral. No edema present bilateral.   Neurology: The patient has intact sensation measured with a 5.07/10g Semmes Weinstein Monofilament  at all pedal sites bilateral. Vibratory sensation diminished bilateral with tuning fork.   Musculoskeletal: Asymptomatic bunion L>R and pes planus pedal deformities noted bilateral. Muscular strength 5/5 in all lower extremity muscular groups bilateral without pain on range of motion . No tenderness with calf compression bilateral.  Assessment and Plan: Problem List Items Addressed This Visit   None Visit Diagnoses     Encounter for comprehensive diabetic foot examination, type 2 diabetes mellitus ([place])    -  Primary   Callus       Hammertoe, bilateral       Bunion           -Examined patient. -Discussed and educated patient on diabetic foot care, especially with  regards to the vascular, neurological and musculoskeletal systems.  -Mechanically smoothed all nails using a rotary bur and smooth callused areas using rotary bur without incident -Sample of foot miracle cream provided  -For Diabetic shoes; Office to arrange shoe fitting and dispensing. -Answered all patient questions -Patient to return for diabetic shoes and inserts  -Patient advised to call the office if any problems or questions arise in the meantime.  Landis Martins, DPM

## 2021-08-31 DIAGNOSIS — L439 Lichen planus, unspecified: Secondary | ICD-10-CM | POA: Diagnosis not present

## 2021-08-31 DIAGNOSIS — Z79899 Other long term (current) drug therapy: Secondary | ICD-10-CM | POA: Diagnosis not present

## 2021-09-01 ENCOUNTER — Other Ambulatory Visit: Payer: Self-pay

## 2021-09-01 ENCOUNTER — Ambulatory Visit (INDEPENDENT_AMBULATORY_CARE_PROVIDER_SITE_OTHER): Payer: Medicare HMO | Admitting: Sports Medicine

## 2021-09-01 DIAGNOSIS — E119 Type 2 diabetes mellitus without complications: Secondary | ICD-10-CM

## 2021-09-01 DIAGNOSIS — L84 Corns and callosities: Secondary | ICD-10-CM

## 2021-09-01 DIAGNOSIS — H401131 Primary open-angle glaucoma, bilateral, mild stage: Secondary | ICD-10-CM | POA: Diagnosis not present

## 2021-09-01 NOTE — Progress Notes (Signed)
Patient presented for foam casting for 3 pair custom diabetic shoe inserts  Patient is measured with a brannock device to be a size 10 1/2 x wide  Diabetic shoes are chosen from the safe step catalog  The shoes chosen are A 7000  Patient will be contacted when the shoes and inserts are ready for pick up

## 2021-09-11 DIAGNOSIS — G4733 Obstructive sleep apnea (adult) (pediatric): Secondary | ICD-10-CM | POA: Diagnosis not present

## 2021-09-21 ENCOUNTER — Telehealth: Payer: Self-pay | Admitting: Sports Medicine

## 2021-09-21 NOTE — Telephone Encounter (Signed)
Pt left 3 messages yesterday asking if his diabetic shoes and inserts were in.   I returned call today and explained we have not gotten the documents from his pcp yet and it has been faxed multiple times. I recommended he call them. I told him everything is ordered just waiting on the documents to start making the inserts.  He then called back and said they said they have not received any of the documents and pt gave me the fax number and it was the one we had. I told pt I would refax it by hand.

## 2021-09-27 ENCOUNTER — Telehealth: Payer: Self-pay | Admitting: Sports Medicine

## 2021-09-27 NOTE — Telephone Encounter (Signed)
Pt called and would like you to call him about the diabetic inserts you measured him for. You told him you would have someone build up the arch or something. He is an Technical sales engineer pt.

## 2021-10-13 ENCOUNTER — Ambulatory Visit: Payer: Medicare HMO

## 2021-10-13 ENCOUNTER — Other Ambulatory Visit: Payer: Self-pay

## 2021-10-13 DIAGNOSIS — M2042 Other hammer toe(s) (acquired), left foot: Secondary | ICD-10-CM

## 2021-10-13 DIAGNOSIS — M2141 Flat foot [pes planus] (acquired), right foot: Secondary | ICD-10-CM

## 2021-10-13 DIAGNOSIS — E119 Type 2 diabetes mellitus without complications: Secondary | ICD-10-CM

## 2021-10-13 DIAGNOSIS — L84 Corns and callosities: Secondary | ICD-10-CM

## 2021-10-13 DIAGNOSIS — M2041 Other hammer toe(s) (acquired), right foot: Secondary | ICD-10-CM

## 2021-10-13 DIAGNOSIS — M2142 Flat foot [pes planus] (acquired), left foot: Secondary | ICD-10-CM

## 2021-10-13 NOTE — Progress Notes (Signed)
SITUATION Reason for Visit: Fitting of Diabetic Shoes & Insoles Patient / Caregiver Report:  Patient is comfortable in shoes  OBJECTIVE DATA: Patient History / Diagnosis:  Encounter for comprehensive diabetic foot examination, type 2 diabetes mellitus ([place])  Change in Status:   None  ACTIONS PERFORMED: In-Person Delivery, patient was fit with: - 1x pair A5500 PDAC approved prefabricated Diabetic Shoes: Apex A7000M Size 10.5XW - 3x pair [id number] PDAC approved CAM milled custom diabetic insoles  Shoes and insoles were verified for structural integrity and safety. Patient wore shoes and insoles in office. Skin was inspected and free of areas of concern after wearing shoes and inserts. Shoes and inserts fit properly. Patient / Caregiver provided with ferbal instruction and demonstration regarding donning, doffing, wear, care, proper fit, function, purpose, cleaning, and use of shoes and insoles ' and in all related precautions and risks and benefits regarding shoes and insoles. Patient / Caregiver was instructed to wear properly fitting socks with shoes at all times. Patient was also provided with verbal instruction regarding how to report any failures or malfunctions of shoes or inserts, and necessary follow up care. Patient / Caregiver was also instructed to contact physician regarding change in status that may affect function of shoes and inserts.   Patient / Caregiver verbalized undersatnding of instruction provided. Patient / Caregiver demonstrated independence with proper donning and doffing of shoes and inserts.  PLAN Patient to follow up as needed. Plan of care was discussed with and agreed upon by patient and/or caregiver. All questions were answered and concerns addressed.

## 2021-10-14 DIAGNOSIS — Z8546 Personal history of malignant neoplasm of prostate: Secondary | ICD-10-CM | POA: Diagnosis not present

## 2021-10-14 DIAGNOSIS — E1169 Type 2 diabetes mellitus with other specified complication: Secondary | ICD-10-CM | POA: Diagnosis not present

## 2021-10-14 DIAGNOSIS — Z23 Encounter for immunization: Secondary | ICD-10-CM | POA: Diagnosis not present

## 2021-10-14 DIAGNOSIS — H60502 Unspecified acute noninfective otitis externa, left ear: Secondary | ICD-10-CM | POA: Diagnosis not present

## 2021-10-14 DIAGNOSIS — K120 Recurrent oral aphthae: Secondary | ICD-10-CM | POA: Diagnosis not present

## 2021-10-14 DIAGNOSIS — I10 Essential (primary) hypertension: Secondary | ICD-10-CM | POA: Diagnosis not present

## 2021-10-14 DIAGNOSIS — F419 Anxiety disorder, unspecified: Secondary | ICD-10-CM | POA: Diagnosis not present

## 2021-10-14 DIAGNOSIS — E7800 Pure hypercholesterolemia, unspecified: Secondary | ICD-10-CM | POA: Diagnosis not present

## 2021-10-14 DIAGNOSIS — J302 Other seasonal allergic rhinitis: Secondary | ICD-10-CM | POA: Diagnosis not present

## 2021-11-12 ENCOUNTER — Telehealth: Payer: Self-pay | Admitting: Cardiology

## 2021-11-12 ENCOUNTER — Encounter: Payer: Self-pay | Admitting: *Deleted

## 2021-11-12 ENCOUNTER — Telehealth (HOSPITAL_COMMUNITY): Payer: Self-pay | Admitting: *Deleted

## 2021-11-12 DIAGNOSIS — I2510 Atherosclerotic heart disease of native coronary artery without angina pectoris: Secondary | ICD-10-CM

## 2021-11-12 DIAGNOSIS — R0789 Other chest pain: Secondary | ICD-10-CM

## 2021-11-12 DIAGNOSIS — I2583 Coronary atherosclerosis due to lipid rich plaque: Secondary | ICD-10-CM

## 2021-11-12 MED ORDER — NITROGLYCERIN 0.4 MG SL SUBL: 0.4000 mg | SUBLINGUAL_TABLET | SUBLINGUAL | 3 refills | Status: DC | PRN

## 2021-11-12 MED ORDER — NITROGLYCERIN 0.4 MG SL SUBL: 0.4000 mg | SUBLINGUAL_TABLET | SUBLINGUAL | 2 refills | Status: AC | PRN

## 2021-11-12 NOTE — Telephone Encounter (Signed)
Pt's medication was resent to pt's preferred pharmacy for a 90 day supply. Confirmation received.

## 2021-11-12 NOTE — Telephone Encounter (Signed)
°*  STAT* If patient is at the pharmacy, call can be transferred to refill team.   1. Which medications need to be refilled? (please list name of each medication and dose if known) nitroGLYCERIN (NITROSTAT) 0.4 MG SL tablet  2. Which pharmacy/location (including street and city if local pharmacy) is medication to be sent to? [place], [place] [address]  3. Do they need a 30 day or 90 day supply? 90 day

## 2021-11-12 NOTE — Telephone Encounter (Signed)
Patient given detailed instructions per Myocardial Perfusion Study Information Sheet for the test on 11/15/2021 at 8:15. Patient notified to arrive 15 minutes early and that it is imperative to arrive on time for appointment to keep from having the test rescheduled.  If you need to cancel or reschedule your appointment, please call the office within 24 hours of your appointment. . Patient verbalized understanding.Justin Cohen

## 2021-11-12 NOTE — Telephone Encounter (Signed)
°  Per MyChart scheduling message:  I am having symptoms like I had last year before discovering I had a blockage and had a stent put in.  Indigestion, belching, stomach aches.  Would like to make sure stent hasn't moved and/or that I don't need another stent.

## 2021-11-12 NOTE — Telephone Encounter (Signed)
Spoke with patient who reports for the past 4 days he has been having s/s very much like what he had before his cath and stent placement in April of this year.  He is having indigestion, gassy stomach and a dull ache around his heart.  He reports it as a very uncomfortable feeling.  He denies any other s/s (no SOB, N/V, radiating pain) but did not have these prior to his stent either.  He has tried taking Tums and apple cider vinegar without relief.  He has not tried his SL Ntg and reports he doesn't know where it is.  Reviewed this information with Dr Marlou Porch who ordered Myoview and renew SL Ntg.     Reviewed the above information with pt.  He is agreeable to having the stress testing and reports he can walk on the treadmill without a problem.  Reviewed the use of SL Ntg and refilled the RX.  Advised to take SL as directed.  ED precautions reviewed.  Pt is aware he will be contacted to be scheduled for stress test.  Also aware instructions will be sent to him via MyChart

## 2021-11-15 ENCOUNTER — Other Ambulatory Visit: Payer: Self-pay

## 2021-11-15 ENCOUNTER — Ambulatory Visit (HOSPITAL_COMMUNITY): Payer: Medicare HMO | Attending: Cardiovascular Disease

## 2021-11-15 DIAGNOSIS — R0789 Other chest pain: Secondary | ICD-10-CM | POA: Insufficient documentation

## 2021-11-15 DIAGNOSIS — I2583 Coronary atherosclerosis due to lipid rich plaque: Secondary | ICD-10-CM | POA: Insufficient documentation

## 2021-11-15 DIAGNOSIS — I2510 Atherosclerotic heart disease of native coronary artery without angina pectoris: Secondary | ICD-10-CM | POA: Insufficient documentation

## 2021-11-15 LAB — MYOCARDIAL PERFUSION IMAGING
LV dias vol: 91 mL (ref 62–150)
LV sys vol: 30 mL
Nuc Stress EF: 67 %
Peak HR: 88 {beats}/min
Rest HR: 49 {beats}/min
Rest Nuclear Isotope Dose: 10.5 mCi
SDS: 2
SRS: 0
SSS: 2
ST Depression (mm): 0 mm
Stress Nuclear Isotope Dose: 31.8 mCi
TID: 0.78

## 2021-11-15 MED ORDER — REGADENOSON 0.4 MG/5ML IV SOLN
0.4000 mg | Freq: Once | INTRAVENOUS | Status: AC
  Administered 2021-11-15: 0.4 mg via INTRAVENOUS

## 2021-11-15 MED ORDER — TECHNETIUM TC 99M TETROFOSMIN IV KIT
31.8000 | PACK | Freq: Once | INTRAVENOUS | Status: AC | PRN
  Administered 2021-11-15: 31.8 via INTRAVENOUS
  Filled 2021-11-15: qty 32

## 2021-11-15 MED ORDER — TECHNETIUM TC 99M TETROFOSMIN IV KIT
10.5000 | PACK | Freq: Once | INTRAVENOUS | Status: AC | PRN
  Administered 2021-11-15: 10.5 via INTRAVENOUS
  Filled 2021-11-15: qty 11

## 2021-12-14 ENCOUNTER — Ambulatory Visit: Payer: Medicare HMO | Admitting: Cardiology

## 2021-12-23 DIAGNOSIS — E782 Mixed hyperlipidemia: Secondary | ICD-10-CM | POA: Diagnosis not present

## 2021-12-23 DIAGNOSIS — E1169 Type 2 diabetes mellitus with other specified complication: Secondary | ICD-10-CM | POA: Diagnosis not present

## 2021-12-23 DIAGNOSIS — I10 Essential (primary) hypertension: Secondary | ICD-10-CM | POA: Diagnosis not present

## 2022-01-12 ENCOUNTER — Other Ambulatory Visit: Payer: Self-pay

## 2022-01-12 ENCOUNTER — Encounter: Payer: Self-pay | Admitting: Cardiology

## 2022-01-12 ENCOUNTER — Ambulatory Visit: Payer: Medicare HMO | Admitting: Cardiology

## 2022-01-12 DIAGNOSIS — E7800 Pure hypercholesterolemia, unspecified: Secondary | ICD-10-CM | POA: Diagnosis not present

## 2022-01-12 DIAGNOSIS — I2510 Atherosclerotic heart disease of native coronary artery without angina pectoris: Secondary | ICD-10-CM

## 2022-01-12 DIAGNOSIS — I5022 Chronic systolic (congestive) heart failure: Secondary | ICD-10-CM

## 2022-01-12 DIAGNOSIS — I10 Essential (primary) hypertension: Secondary | ICD-10-CM

## 2022-01-12 MED ORDER — ROSUVASTATIN CALCIUM 10 MG PO TABS: 10.0000 mg | ORAL_TABLET | Freq: Every day | ORAL | 3 refills | Status: DC

## 2022-01-12 NOTE — Assessment & Plan Note (Signed)
Currently NYHA class II.  Doing well.  Intolerant of beta-blocker secondary to fatigue.  On losartan 100 mg a day. ?

## 2022-01-12 NOTE — Patient Instructions (Signed)
Medication Instructions:  ?Please start Crestor 10 mg daily. ?Discontinue your Brilinta. ?Continue all other medications as listed. ? ?*If you need a refill on your cardiac medications before your next appointment, please call your pharmacy* ? ?Lab Work: ?Please have blood work in 3 months (Lipid) ? ?If you have labs (blood work) drawn today and your tests are completely normal, you will receive your results only by: ?MyChart Message (if you have MyChart) OR ?A paper copy in the mail ?If you have any lab test that is abnormal or we need to change your treatment, we will call you to review the results. ? ?Follow-Up: ?At [facility], you and your health needs are our priority.  As part of our continuing mission to provide you with exceptional heart care, we have created designated Provider Care Teams.  These Care Teams include your primary Cardiologist (physician) and Advanced Practice Providers (APPs -  Physician Assistants and Nurse Practitioners) who all work together to provide you with the care you need, when you need it. ? ?We recommend signing up for the patient portal called "MyChart".  Sign up information is provided on this After Visit Summary.  MyChart is used to connect with patients for Virtual Visits (Telemedicine).  Patients are able to view lab/test results, encounter notes, upcoming appointments, etc.  Non-urgent messages can be sent to your provider as well.   ?To learn more about what you can do with MyChart, go to [email or web address].   ? ?Your next appointment:   ?6 months with PA/NP and 1 year with Dr Marlou Porch ? ? ?Thank you for choosing [facility]!! ? ? ? ?

## 2022-01-12 NOTE — Assessment & Plan Note (Addendum)
Proximal LAD stent in April 2022.  Nuclear stress test 11/2021 low risk with no ischemia.  The stress test was ordered because he was having some indigestion like he had prior to his stent.  Continue with goal-directed medical therapy.  Would be reasonable to go back to aspirin 81 mg only 02/08/2022.  We will go ahead and stop his Brilinta.  Easy bruising. ?

## 2022-01-12 NOTE — Assessment & Plan Note (Addendum)
Rosuvastatin 20 mg prescribed, it appears that he is taking 5 mg every evening.  LDL goal less than 55 with prior unstable angina.  He was only taking 5 mg a day.  We will go ahead and prescribe 10 mg of Crestor a day.  Recheck lipid panel in 3 months. ?

## 2022-01-12 NOTE — Progress Notes (Signed)
Cardiology Office Note:    Date:  01/12/2022   ID:  Justin Cohen, DOB 1946-04-04, MRN [id number]  PCP:  Antony Contras, MD   [facility] Providers Cardiologist:  Candee Furbish, MD     Referring MD: Antony Contras, MD    History of Present Illness:    Justin Cohen is a 76 y.o. male here for follow-up coronary artery disease with drug-eluting stent to the LAD in April 2022.  Proximal region.  He had an ischemic cardiomyopathy with EF 45 to 50% inferior septal hypokinesis.  Has been intolerant of beta-blocker in the past because of fatigue.  Also has diabetes hypertension hyperlipidemia and prostate cancer.  Lots of bruising. Wants to come off Brilinta.   Had GERD like symptoms. Had some more indigestion. NUC stress 1/23 looked normal.   Past Medical History:  Diagnosis Date   Arthritis    Basal cell carcinoma of nose    CAD (coronary artery disease)    Admx with [place] 4/22 >> S/p DES to pLAD 7/61   Complication of anesthesia    Diabetes Mellitus Type 2    TYPE 2    Environmental allergies    Gastroesophageal reflux disease    Glaucoma    Hyperlipidemia    Hypertension    Ischemic cardiomyopathy    Echocardiogram 4/22: EF 45-50, ant-sept and apical AK, inf-sept and inf HK   Melanoma ([place])    Prostate cancer ([place])    Sinus problem    Sleep apnea    cpap- setting at 10     Past Surgical History:  Procedure Laterality Date   BUNIONECTOMY  2014   right foot   CHOLECYSTECTOMY N/A 03/12/2015   Procedure: LAPAROSCOPIC CHOLECYSTECTOMY CHOLANGIOGRAM WAS NOT PERFORMED;  Surgeon: Ralene Ok, MD;  Location: [facility];  Service: General;  Laterality: N/A;   CORONARY BALLOON ANGIOPLASTY N/A 02/08/2021   Procedure: CORONARY BALLOON ANGIOPLASTY;  Surgeon: Sherren Mocha, MD;  Location: [place] [facility];  Service: Cardiovascular;  Laterality: N/A;   ERCP N/A 03/08/2015   Procedure: ENDOSCOPIC RETROGRADE CHOLANGIOPANCREATOGRAPHY (ERCP);  Surgeon: Inda Castle, MD;  Location: [facility];  Service: Gastroenterology;  Laterality: N/A;   ERCP N/A 03/10/2015   Procedure: ENDOSCOPIC RETROGRADE CHOLANGIOPANCREATOGRAPHY (ERCP);  Surgeon: Clarene Essex, MD;  Location: [facility];  Service: Endoscopy;  Laterality: N/A;   ERCP N/A 05/21/2015   Procedure: ENDOSCOPIC RETROGRADE CHOLANGIOPANCREATOGRAPHY (ERCP) with spyglass and stent removal;  Surgeon: Clarene Essex, MD;  Location: [facility] ENDOS[facility];  Service: Endoscopy;  Laterality: N/A;   HERNIA REPAIR     HERNIA REPAIR  2010   JOINT REPLACEMENT  02/17/15   left knee replacement   JOINT REPLACEMENT  2001   right knee replacement   JOINT REPLACEMENT  2012   "right knee cap replacement"   KNEE SURGERY     X 2   LEFT HEART CATH AND CORONARY ANGIOGRAPHY N/A 02/08/2021   Procedure: LEFT HEART CATH AND CORONARY ANGIOGRAPHY;  Surgeon: Sherren Mocha, MD;  Location: [place] [facility];  Service: Cardiovascular;  Laterality: N/A;   melanoma removal  Dec 2015   removed from [place]  2009   due to prostate cancer   TOTAL HIP ARTHROPLASTY Right 11/21/2017   Procedure: RIGHT TOTAL HIP ARTHROPLASTY ANTERIOR APPROACH;  Surgeon: Paralee Cancel, MD;  Location: [facility];  Service: Orthopedics;  Laterality: Right;  70 mins   TOTAL KNEE ARTHROPLASTY Left 02/17/2015   Procedure: LEFT  TOTAL KNEE ARTHROPLASTY;  Surgeon: Paralee Cancel, MD;  Location: [facility];  Service: Orthopedics;  Laterality: Left;    Current Medications: Current Meds  Medication Sig   aspirin EC 81 MG tablet Take 81 mg by mouth daily. Swallow whole.   B Complex CAPS See admin instructions.   cetirizine (ZYRTEC) 10 MG tablet Take 10 mg by mouth daily.   Cholecalciferol (VITAMIN D) 2000 UNITS tablet Take 2,000 Units by mouth daily.   Cyanocobalamin (VITAMIN B12) 1000 MCG TBCR 1 tablet   fluticasone (FLONASE) 50 MCG/ACT nasal spray Place 2 sprays into both nostrils daily.   latanoprost (XALATAN) 0.005 % ophthalmic solution One drop in each eye nightly.    losartan (COZAAR) 100 MG tablet Take 1 tablet (100 mg total) by mouth daily.   metFORMIN (GLUCOPHAGE) 1000 MG tablet Take 1,000 mg by mouth 2 (two) times daily with a meal.   Multiple Vitamin (MULTIVITAMIN WITH MINERALS) TABS tablet Take 1 tablet by mouth daily.    nitroGLYCERIN (NITROSTAT) 0.4 MG SL tablet Place 1 tablet (0.4 mg total) under the tongue every 5 (five) minutes as needed for chest pain.   Omega-3 Fatty Acids (FISH OIL) 1000 MG CAPS Take 1,000 mg by mouth daily.    pioglitazone (ACTOS) 45 MG tablet Take 45 mg by mouth daily.    Probiotic Product (PROBIOTIC DAILY PO) Take 1 capsule by mouth daily.   rosuvastatin (CRESTOR) 10 MG tablet Take 1 tablet (10 mg total) by mouth daily.   venlafaxine XR (EFFEXOR-XR) 75 MG 24 hr capsule Take 75 mg by mouth daily with breakfast.    [DISCONTINUED] rosuvastatin (CRESTOR) 20 MG tablet Take 5 mg by mouth every evening.    [DISCONTINUED] ticagrelor (BRILINTA) 90 MG TABS tablet Take 90 mg by mouth 2 (two) times daily.     Allergies:   Beta adrenergic blockers   Social History   Socioeconomic History   Marital status: Married    Spouse name: Not on file   Number of children: Not on file   Years of education: Not on file   Highest education level: Not on file  Occupational History   Not on file  Tobacco Use   Smoking status: Former    Types: Cigarettes    Quit date: 03/05/1973    Years since quitting: 48.8   Smokeless tobacco: Never  Vaping Use   Vaping Use: Never used  Substance and Sexual Activity   Alcohol use: Yes    Comment: occasional glass of wine    Drug use: No   Sexual activity: Not on file  Other Topics Concern   Not on file  Social History Narrative   ** Merged History Encounter **       Social Determinants of Health   Financial Resource Strain: Not on file  Food Insecurity: Not on file  Transportation Needs: Not on file  Physical Activity: Not on file  Stress: Not on file  Social Connections: Not on file      Family History: The patient's family history includes CAD in his father; Heart failure in his mother; Lung cancer in his sister.  ROS:   Please see the history of present illness.     All other systems reviewed and are negative.  EKGs/Labs/Other Studies Reviewed:    The following studies were reviewed today:   LEFT HEART CATH 02/08/2021 Narrative  Ost LAD to Prox LAD lesion is 95% stenosed.  A drug-eluting stent was successfully placed using a SYNERGY XD 3.50X16.  Post intervention, there is a 0% residual stenosis.  Prox RCA lesion is 50% stenosed.  Mid LM to Dist LM lesion is 30% stenosed. 1.  Critical ostial/proximal LAD stenosis, treated successfully with PCI using a 3.5 x 16 mm Synergy DES 2.  Patent left main and left circumflex with mild nonobstructive stenosis 3.  Nonobstructive mid RCA stenosis, estimated at 40 to 50%   Recommend: Dual antiplatelet therapy with aspirin and ticagrelor x12 months (ACS class I recommendation), aggressive medical therapy and risk reduction.  As long as no complications arise, patient eligible for hospital discharge tomorrow morning.   Echocardiogram 02/07/21 EF 45-50, ant-sept and apical AK, inf-sept and inf HK, mild LVH, Gr 1 DD, normal RVSF, AV sclerosis without stenosis   NUC stress 11/15/21:   The study is normal. The study is low risk.   No ST deviation was noted.   LV perfusion is normal.   Left ventricular function is normal. Nuclear stress EF: 67 %. The left ventricular ejection fraction is hyperdynamic (>65%). End diastolic cavity size is normal.   Recent Labs: 02/07/2021: B Natriuretic Peptide 35.1 02/09/2021: BUN 16; Creatinine, Ser 0.91; Hemoglobin 14.2; Platelets 344; Potassium 4.0; Sodium 137  Recent Lipid Panel    Component Value Date/Time   CHOL 132 02/08/2021 0129   TRIG 156 (H) 02/08/2021 0129   HDL 39 (L) 02/08/2021 0129   CHOLHDL 3.4 02/08/2021 0129   VLDL 31 02/08/2021 0129   LDLCALC 62 02/08/2021 0129      Risk Assessment/Calculations:              Physical Exam:    VS:  BP 128/70    Pulse 64    Ht '5\' 8"'$  (1.727 m)    Wt 259 lb 9.6 oz (117.8 kg)    SpO2 94%    BMI 39.47 kg/m     Wt Readings from Last 3 Encounters:  01/12/22 259 lb 9.6 oz (117.8 kg)  11/15/21 240 lb (108.9 kg)  06/11/21 240 lb (108.9 kg)     GEN:  Well nourished, well developed in no acute distress HEENT: Normal NECK: No JVD; No carotid bruits LYMPHATICS: No lymphadenopathy CARDIAC: RRR, no murmurs, no rubs, gallops RESPIRATORY:  Clear to auscultation without rales, wheezing or rhonchi  ABDOMEN: Soft, non-tender, non-distended MUSCULOSKELETAL:  No edema; No deformity  SKIN: Warm and dry, bruises noted NEUROLOGIC:  Alert and oriented x 3 PSYCHIATRIC:  Normal affect   ASSESSMENT:    1. Coronary artery disease involving native coronary artery of native heart without angina pectoris   2. Pure hypercholesterolemia   3. Chronic systolic heart failure ([place])   4. Primary hypertension    PLAN:    In order of problems listed above:  CAD (coronary artery disease) Proximal LAD stent in April 2022.  Nuclear stress test 11/2021 low risk with no ischemia.  The stress test was ordered because he was having some indigestion like he had prior to his stent.  Continue with goal-directed medical therapy.  Would be reasonable to go back to aspirin 81 mg only 02/08/2022.  We will go ahead and stop his Brilinta.  Easy bruising.  Pure hypercholesterolemia Rosuvastatin 20 mg prescribed, it appears that he is taking 5 mg every evening.  LDL goal less than 55 with prior unstable angina.  He was only taking 5 mg a day.  We will go ahead and prescribe 10 mg of Crestor a day.  Recheck lipid panel in 3 months.  Chronic systolic heart failure (  [place]) Currently NYHA class II.  Doing well.  Intolerant of beta-blocker secondary to fatigue.  On losartan 100 mg a day.  HTN (hypertension) Previously elevated.  On losartan 100 mg a day.          Medication Adjustments/Labs and Tests Ordered: Current medicines are reviewed at length with the patient today.  Concerns regarding medicines are outlined above.  Orders Placed This Encounter  Procedures   Lipid panel   Meds ordered this encounter  Medications   rosuvastatin (CRESTOR) 10 MG tablet    Sig: Take 1 tablet (10 mg total) by mouth daily.    Dispense:  90 tablet    Refill:  3    Patient Instructions  Medication Instructions:  Please start Crestor 10 mg daily. Discontinue your Brilinta. Continue all other medications as listed.  *If you need a refill on your cardiac medications before your next appointment, please call your pharmacy*  Lab Work: Please have blood work in 3 months (Lipid)  If you have labs (blood work) drawn today and your tests are completely normal, you will receive your results only by: [place] (if you have MyChart) OR A paper copy in the mail If you have any lab test that is abnormal or we need to change your treatment, we will call you to review the results.  Follow-Up: At [facility], you and your health needs are our priority.  As part of our continuing mission to provide you with exceptional heart care, we have created designated Provider Care Teams.  These Care Teams include your primary Cardiologist (physician) and Advanced Practice Providers (APPs -  Physician Assistants and Nurse Practitioners) who all work together to provide you with the care you need, when you need it.  We recommend signing up for the patient portal called "MyChart".  Sign up information is provided on this After Visit Summary.  MyChart is used to connect with patients for Virtual Visits (Telemedicine).  Patients are able to view lab/test results, encounter notes, upcoming appointments, etc.  Non-urgent messages can be sent to your provider as well.   To learn more about what you can do with MyChart, go to [email or web address].    Your next  appointment:   6 months with PA/NP and 1 year with Dr Marlou Porch   Thank you for choosing [facility]!!      Signed, Candee Furbish, MD  01/12/2022 3:15 PM    [place]

## 2022-01-12 NOTE — Assessment & Plan Note (Signed)
Previously elevated.  On losartan 100 mg a day. ?

## 2022-02-10 ENCOUNTER — Ambulatory Visit: Payer: Medicare HMO | Admitting: Cardiology

## 2022-02-13 ENCOUNTER — Other Ambulatory Visit: Payer: Self-pay | Admitting: Physician Assistant

## 2022-03-03 DIAGNOSIS — E119 Type 2 diabetes mellitus without complications: Secondary | ICD-10-CM | POA: Diagnosis not present

## 2022-03-03 DIAGNOSIS — Z961 Presence of intraocular lens: Secondary | ICD-10-CM | POA: Diagnosis not present

## 2022-03-03 DIAGNOSIS — H524 Presbyopia: Secondary | ICD-10-CM | POA: Diagnosis not present

## 2022-03-03 DIAGNOSIS — H401131 Primary open-angle glaucoma, bilateral, mild stage: Secondary | ICD-10-CM | POA: Diagnosis not present

## 2022-03-15 DIAGNOSIS — H524 Presbyopia: Secondary | ICD-10-CM | POA: Diagnosis not present

## 2022-03-17 DIAGNOSIS — Z0100 Encounter for examination of eyes and vision without abnormal findings: Secondary | ICD-10-CM | POA: Diagnosis not present

## 2022-04-14 ENCOUNTER — Other Ambulatory Visit: Payer: Medicare HMO

## 2022-04-14 DIAGNOSIS — E7800 Pure hypercholesterolemia, unspecified: Secondary | ICD-10-CM

## 2022-04-14 LAB — LIPID PANEL
Chol/HDL Ratio: 2.6 ratio (ref 0.0–5.0)
Cholesterol, Total: 149 mg/dL (ref 100–199)
HDL: 58 mg/dL (ref >39)
LDL Chol Calc (NIH): 75 mg/dL (ref 0–99)
Triglycerides: 86 mg/dL (ref 0–149)
VLDL Cholesterol Cal: 16 mg/dL (ref 5–40)

## 2022-04-15 ENCOUNTER — Telehealth: Payer: Self-pay | Admitting: *Deleted

## 2022-04-15 DIAGNOSIS — Z79899 Other long term (current) drug therapy: Secondary | ICD-10-CM

## 2022-04-15 DIAGNOSIS — E782 Mixed hyperlipidemia: Secondary | ICD-10-CM

## 2022-04-15 MED ORDER — ROSUVASTATIN CALCIUM 20 MG PO TABS: 20.0000 mg | ORAL_TABLET | Freq: Every day | ORAL | 3 refills | Status: DC

## 2022-04-15 NOTE — Telephone Encounter (Signed)
LDL 76 on Crestor '10mg'$  a day.  Would like to see LDL <70 or even < 55.  Let's try going to Crestor '20mg'$  once a day.  Repeat lipid panel in 2 months.  Candee Furbish, MD   Pt is aware of the above Orders placed, lab scheduled.

## 2022-04-28 DIAGNOSIS — J019 Acute sinusitis, unspecified: Secondary | ICD-10-CM | POA: Diagnosis not present

## 2022-05-06 DIAGNOSIS — E7800 Pure hypercholesterolemia, unspecified: Secondary | ICD-10-CM | POA: Diagnosis not present

## 2022-05-06 DIAGNOSIS — L4050 Arthropathic psoriasis, unspecified: Secondary | ICD-10-CM | POA: Diagnosis not present

## 2022-05-06 DIAGNOSIS — F419 Anxiety disorder, unspecified: Secondary | ICD-10-CM | POA: Diagnosis not present

## 2022-05-06 DIAGNOSIS — I10 Essential (primary) hypertension: Secondary | ICD-10-CM | POA: Diagnosis not present

## 2022-05-06 DIAGNOSIS — E1169 Type 2 diabetes mellitus with other specified complication: Secondary | ICD-10-CM | POA: Diagnosis not present

## 2022-05-06 DIAGNOSIS — Z8546 Personal history of malignant neoplasm of prostate: Secondary | ICD-10-CM | POA: Diagnosis not present

## 2022-05-06 DIAGNOSIS — Z1331 Encounter for screening for depression: Secondary | ICD-10-CM | POA: Diagnosis not present

## 2022-05-06 DIAGNOSIS — Z0000 Encounter for general adult medical examination without abnormal findings: Secondary | ICD-10-CM | POA: Diagnosis not present

## 2022-05-06 DIAGNOSIS — J302 Other seasonal allergic rhinitis: Secondary | ICD-10-CM | POA: Diagnosis not present

## 2022-05-20 DIAGNOSIS — E782 Mixed hyperlipidemia: Secondary | ICD-10-CM | POA: Diagnosis not present

## 2022-05-20 DIAGNOSIS — I10 Essential (primary) hypertension: Secondary | ICD-10-CM | POA: Diagnosis not present

## 2022-05-20 DIAGNOSIS — E1169 Type 2 diabetes mellitus with other specified complication: Secondary | ICD-10-CM | POA: Diagnosis not present

## 2022-05-20 DIAGNOSIS — I2510 Atherosclerotic heart disease of native coronary artery without angina pectoris: Secondary | ICD-10-CM | POA: Diagnosis not present

## 2022-05-25 DIAGNOSIS — L439 Lichen planus, unspecified: Secondary | ICD-10-CM | POA: Diagnosis not present

## 2022-05-25 DIAGNOSIS — L821 Other seborrheic keratosis: Secondary | ICD-10-CM | POA: Diagnosis not present

## 2022-05-25 DIAGNOSIS — L570 Actinic keratosis: Secondary | ICD-10-CM | POA: Diagnosis not present

## 2022-05-25 DIAGNOSIS — Z85828 Personal history of other malignant neoplasm of skin: Secondary | ICD-10-CM | POA: Diagnosis not present

## 2022-05-25 DIAGNOSIS — L578 Other skin changes due to chronic exposure to nonionizing radiation: Secondary | ICD-10-CM | POA: Diagnosis not present

## 2022-05-31 DIAGNOSIS — J309 Allergic rhinitis, unspecified: Secondary | ICD-10-CM | POA: Diagnosis not present

## 2022-05-31 DIAGNOSIS — H6982 Other specified disorders of Eustachian tube, left ear: Secondary | ICD-10-CM | POA: Diagnosis not present

## 2022-05-31 DIAGNOSIS — H9202 Otalgia, left ear: Secondary | ICD-10-CM | POA: Diagnosis not present

## 2022-05-31 DIAGNOSIS — E119 Type 2 diabetes mellitus without complications: Secondary | ICD-10-CM | POA: Diagnosis not present

## 2022-06-02 DIAGNOSIS — K006 Disturbances in tooth eruption: Secondary | ICD-10-CM | POA: Diagnosis not present

## 2022-06-08 DIAGNOSIS — H401131 Primary open-angle glaucoma, bilateral, mild stage: Secondary | ICD-10-CM | POA: Diagnosis not present

## 2022-06-09 DIAGNOSIS — K1329 Other disturbances of oral epithelium, including tongue: Secondary | ICD-10-CM | POA: Diagnosis not present

## 2022-06-21 ENCOUNTER — Other Ambulatory Visit: Payer: Medicare HMO

## 2022-06-21 DIAGNOSIS — Z79899 Other long term (current) drug therapy: Secondary | ICD-10-CM | POA: Diagnosis not present

## 2022-06-21 DIAGNOSIS — E782 Mixed hyperlipidemia: Secondary | ICD-10-CM | POA: Diagnosis not present

## 2022-06-21 LAB — ALT: ALT: 45 IU/L — ABNORMAL HIGH (ref 0–44)

## 2022-06-21 LAB — LIPID PANEL
Chol/HDL Ratio: 2.5 ratio (ref 0.0–5.0)
Cholesterol, Total: 128 mg/dL (ref 100–199)
HDL: 52 mg/dL (ref >39)
LDL Chol Calc (NIH): 55 mg/dL (ref 0–99)
Triglycerides: 114 mg/dL (ref 0–149)
VLDL Cholesterol Cal: 21 mg/dL (ref 5–40)

## 2022-06-22 DIAGNOSIS — E782 Mixed hyperlipidemia: Secondary | ICD-10-CM | POA: Diagnosis not present

## 2022-06-22 DIAGNOSIS — E119 Type 2 diabetes mellitus without complications: Secondary | ICD-10-CM | POA: Diagnosis not present

## 2022-06-22 DIAGNOSIS — I10 Essential (primary) hypertension: Secondary | ICD-10-CM | POA: Diagnosis not present

## 2022-06-22 DIAGNOSIS — I2510 Atherosclerotic heart disease of native coronary artery without angina pectoris: Secondary | ICD-10-CM | POA: Diagnosis not present

## 2022-06-23 ENCOUNTER — Ambulatory Visit: Payer: Self-pay | Admitting: Licensed Clinical Social Worker

## 2022-06-23 DIAGNOSIS — K134 Granuloma and granuloma-like lesions of oral mucosa: Secondary | ICD-10-CM | POA: Diagnosis not present

## 2022-06-23 DIAGNOSIS — K1329 Other disturbances of oral epithelium, including tongue: Secondary | ICD-10-CM | POA: Diagnosis not present

## 2022-06-23 NOTE — Patient Outreach (Signed)
  Care Coordination   06/23/2022 Name: Justin Cohen MRN: [id number] DOB: Mar 07, 1946   Care Coordination Outreach Attempts:  An unsuccessful telephone outreach was attempted today to offer the patient information about available care coordination services as a benefit of their health plan.   Follow Up Plan:  Additional outreach attempts will be made to offer the patient care coordination information and services.   Encounter Outcome:  No Answer  Care Coordination Interventions Activated:  Yes   Care Coordination Interventions:  No, not indicated    Lenor Derrick, MSW  Social Worker [facility]  [phone]

## 2022-07-05 DIAGNOSIS — K1329 Other disturbances of oral epithelium, including tongue: Secondary | ICD-10-CM | POA: Diagnosis not present

## 2022-07-05 DIAGNOSIS — K1321 Leukoplakia of oral mucosa, including tongue: Secondary | ICD-10-CM | POA: Diagnosis not present

## 2022-07-19 ENCOUNTER — Other Ambulatory Visit: Payer: Self-pay

## 2022-07-19 MED ORDER — ROSUVASTATIN CALCIUM 20 MG PO TABS: 20.0000 mg | ORAL_TABLET | Freq: Every day | ORAL | 1 refills | Status: DC

## 2022-08-03 ENCOUNTER — Encounter: Payer: Self-pay | Admitting: Nurse Practitioner

## 2022-08-03 ENCOUNTER — Ambulatory Visit: Payer: Medicare HMO | Attending: Physician Assistant | Admitting: Nurse Practitioner

## 2022-08-03 VITALS — BP 122/66 | HR 69 | Ht 68.0 in | Wt 269.6 lb

## 2022-08-03 DIAGNOSIS — I5022 Chronic systolic (congestive) heart failure: Secondary | ICD-10-CM

## 2022-08-03 DIAGNOSIS — I2510 Atherosclerotic heart disease of native coronary artery without angina pectoris: Secondary | ICD-10-CM

## 2022-08-03 DIAGNOSIS — I10 Essential (primary) hypertension: Secondary | ICD-10-CM | POA: Diagnosis not present

## 2022-08-03 DIAGNOSIS — I255 Ischemic cardiomyopathy: Secondary | ICD-10-CM | POA: Diagnosis not present

## 2022-08-03 DIAGNOSIS — E785 Hyperlipidemia, unspecified: Secondary | ICD-10-CM | POA: Diagnosis not present

## 2022-08-03 DIAGNOSIS — Z9989 Dependence on other enabling machines and devices: Secondary | ICD-10-CM | POA: Diagnosis not present

## 2022-08-03 DIAGNOSIS — G4733 Obstructive sleep apnea (adult) (pediatric): Secondary | ICD-10-CM | POA: Diagnosis not present

## 2022-08-03 NOTE — Patient Instructions (Signed)
Medication Instructions:   Your physician recommends that you continue on your current medications as directed. Please refer to the Current Medication list given to you today.   *If you need a refill on your cardiac medications before your next appointment, please call your pharmacy*   Lab Work:  None ordered.  If you have labs (blood work) drawn today and your tests are completely normal, you will receive your results only by: [place] (if you have MyChart) OR A paper copy in the mail If you have any lab test that is abnormal or we need to change your treatment, we will call you to review the results.   Testing/Procedures:  None ordered.   Follow-Up: At [facility], you and your health needs are our priority.  As part of our continuing mission to provide you with exceptional heart care, we have created designated Provider Care Teams.  These Care Teams include your primary Cardiologist (physician) and Advanced Practice Providers (APPs -  Physician Assistants and Nurse Practitioners) who all work together to provide you with the care you need, when you need it.  We recommend signing up for the patient portal called "MyChart".  Sign up information is provided on this After Visit Summary.  MyChart is used to connect with patients for Virtual Visits (Telemedicine).  Patients are able to view lab/test results, encounter notes, upcoming appointments, etc.  Non-urgent messages can be sent to your provider as well.   To learn more about what you can do with MyChart, go to [email or web address].    Your next appointment:   6 month(s)  The format for your next appointment:   In Person  Provider:   Candee Furbish, MD     Other Instructions  Daily Weight Record It is important to weigh yourself daily. To do this: Make sure you use a reliable scale. Use the same scale each day. Keep this daily weight chart near your scale. Weigh yourself each morning at the same time  after you use the bathroom. Before weighing yourself: Take off your shoes. Make sure you are wearing the same amount of clothing each day. Write down your weight in the spaces on the form. Compare today's weight to yesterday's weight. Bring this form with you to your follow-up visits with your health care provider. Call your health care provider if you have concerns about your weight, including rapid weight gain or loss. Date: ________ Weight: ____________________ Date: ________ Weight: ____________________ Date: ________ Weight: ____________________ Date: ________ Weight: ____________________ Date: ________ Weight: ____________________ Date: ________ Weight: ____________________ Date: ________ Weight: ____________________ Date: ________ Weight: ____________________ Date: ________ Weight: ____________________ Date: ________ Weight: ____________________ Date: ________ Weight: ____________________ Date: ________ Weight: ____________________ Date: ________ Weight: ____________________ Date: ________ Weight: ____________________ Date: ________ Weight: ____________________ Date: ________ Weight: ____________________ Date: ________ Weight: ____________________ Date: ________ Weight: ____________________ Date: ________ Weight: ____________________ Date: ________ Weight: ____________________ Date: ________ Weight: ____________________ Date: ________ Weight: ____________________ Date: ________ Weight: ____________________ Date: ________ Weight: ____________________ Date: ________ Weight: ____________________ Date: ________ Weight: ____________________ Date: ________ Weight: ____________________ Date: ________ Weight: ____________________ Date: ________ Weight: ____________________ Date: ________ Weight: ____________________ Date: ________ Weight: ____________________ Date: ________ Weight: ____________________ Date: ________ Weight: ____________________ Date: ________ Weight:  ____________________ Date: ________ Weight: ____________________ Date: ________ Weight: ____________________ Date: ________ Weight: ____________________ Date: ________ Weight: ____________________ Date: ________ Weight: ____________________ Date: ________ Weight: ____________________ Date: ________ Weight: ____________________ Date: ________ Weight: ____________________ Date: ________ Weight: ____________________ Date: ________ Weight: ____________________ Date: ________ Weight: ____________________ Date: ________ Weight: ____________________ Date:  ________ Weight: ____________________ Date: ________ Weight: ____________________ Date: ________ Weight: ____________________ Date: ________ Weight: ____________________ This information is not intended to replace advice given to you by your health care provider. Make sure you discuss any questions you have with your health care provider. Document Revised: 06/29/2021 Document Reviewed: 06/29/2021 Elsevier Patient Education  [place].  Low-Sodium Eating Plan Sodium, which is an element that makes up salt, helps you maintain a healthy balance of fluids in your body. Too much sodium can increase your blood pressure and cause fluid and waste to be held in your body. Your health care provider or dietitian may recommend following this plan if you have high blood pressure (hypertension), kidney disease, liver disease, or heart failure. Eating less sodium can help lower your blood pressure, reduce swelling, and protect your heart, liver, and kidneys. What are tips for following this plan? Reading food labels The Nutrition Facts label lists the amount of sodium in one serving of the food. If you eat more than one serving, you must multiply the listed amount of sodium by the number of servings. Choose foods with less than 140 mg of sodium per serving. Avoid foods with 300 mg of sodium or more per serving. Shopping  Look for lower-sodium products,  often labeled as "low-sodium" or "no salt added." Always check the sodium content, even if foods are labeled as "unsalted" or "no salt added." Buy fresh foods. Avoid canned foods and pre-made or frozen meals. Avoid canned, cured, or processed meats. Buy breads that have less than 80 mg of sodium per slice. Cooking  Eat more home-cooked food and less restaurant, buffet, and fast food. Avoid adding salt when cooking. Use salt-free seasonings or herbs instead of table salt or sea salt. Check with your health care provider or pharmacist before using salt substitutes. Cook with plant-based oils, such as canola, sunflower, or olive oil. Meal planning When eating at a restaurant, ask that your food be prepared with less salt or no salt, if possible. Avoid dishes labeled as brined, pickled, cured, smoked, or made with soy sauce, miso, or teriyaki sauce. Avoid foods that contain MSG (monosodium glutamate). MSG is sometimes added to [place] food, bouillon, and some canned foods. Make meals that can be grilled, baked, poached, roasted, or steamed. These are generally made with less sodium. General information Most people on this plan should limit their sodium intake to 1,500-2,000 mg (milligrams) of sodium each day. What foods should I eat? Fruits Fresh, frozen, or canned fruit. Fruit juice. Vegetables Fresh or frozen vegetables. "No salt added" canned vegetables. "No salt added" tomato sauce and paste. Low-sodium or reduced-sodium tomato and vegetable juice. Grains Low-sodium cereals, including oats, puffed wheat and rice, and shredded wheat. Low-sodium crackers. Unsalted rice. Unsalted pasta. Low-sodium bread. Whole-grain breads and whole-grain pasta. Meats and other proteins Fresh or frozen (no salt added) meat, poultry, seafood, and fish. Low-sodium canned tuna and salmon. Unsalted nuts. Dried peas, beans, and lentils without added salt. Unsalted canned beans. Eggs. Unsalted nut  butters. Dairy Milk. Soy milk. Cheese that is naturally low in sodium, such as ricotta cheese, fresh mozzarella, or Swiss cheese. Low-sodium or reduced-sodium cheese. Cream cheese. Yogurt. Seasonings and condiments Fresh and dried herbs and spices. Salt-free seasonings. Low-sodium mustard and ketchup. Sodium-free salad dressing. Sodium-free light mayonnaise. Fresh or refrigerated horseradish. Lemon juice. Vinegar. Other foods Homemade, reduced-sodium, or low-sodium soups. Unsalted popcorn and pretzels. Low-salt or salt-free chips. The items listed above may not be a complete list of foods  and beverages you can eat. Contact a dietitian for more information. What foods should I avoid? Vegetables Sauerkraut, pickled vegetables, and relishes. Olives. [place] fries. Onion rings. Regular canned vegetables (not low-sodium or reduced-sodium). Regular canned tomato sauce and paste (not low-sodium or reduced-sodium). Regular tomato and vegetable juice (not low-sodium or reduced-sodium). Frozen vegetables in sauces. Grains Instant hot cereals. Bread stuffing, pancake, and biscuit mixes. Croutons. Seasoned rice or pasta mixes. Noodle soup cups. Boxed or frozen macaroni and cheese. Regular salted crackers. Self-rising flour. Meats and other proteins Meat or fish that is salted, canned, smoked, spiced, or pickled. Precooked or cured meat, such as sausages or meat loaves. [place] Salines. Ham. Pepperoni. Hot dogs. Corned beef. Chipped beef. Salt pork. Jerky. Pickled herring. Anchovies and sardines. Regular canned tuna. Salted nuts. Dairy Processed cheese and cheese spreads. Hard cheeses. Cheese curds. Blue cheese. Feta cheese. String cheese. Regular cottage cheese. Buttermilk. Canned milk. Fats and oils Salted butter. Regular margarine. Ghee. Bacon fat. Seasonings and condiments Onion salt, garlic salt, seasoned salt, table salt, and sea salt. Canned and packaged gravies. Worcestershire sauce. Tartar sauce. Barbecue  sauce. Teriyaki sauce. Soy sauce, including reduced-sodium. Steak sauce. Fish sauce. Oyster sauce. Cocktail sauce. Horseradish that you find on the shelf. Regular ketchup and mustard. Meat flavorings and tenderizers. Bouillon cubes. Hot sauce. Pre-made or packaged marinades. Pre-made or packaged taco seasonings. Relishes. Regular salad dressings. Salsa. Other foods Salted popcorn and pretzels. Corn chips and puffs. Potato and tortilla chips. Canned or dried soups. Pizza. Frozen entrees and pot pies. The items listed above may not be a complete list of foods and beverages you should avoid. Contact a dietitian for more information. Summary Eating less sodium can help lower your blood pressure, reduce swelling, and protect your heart, liver, and kidneys. Most people on this plan should limit their sodium intake to 1,500-2,000 mg (milligrams) of sodium each day. Canned, boxed, and frozen foods are high in sodium. Restaurant foods, fast foods, and pizza are also very high in sodium. You also get sodium by adding salt to food. Try to cook at home, eat more fresh fruits and vegetables, and eat less fast food and canned, processed, or prepared foods. This information is not intended to replace advice given to you by your health care provider. Make sure you discuss any questions you have with your health care provider. Document Revised: 11/29/2019 Document Reviewed: 09/25/2019 Elsevier Patient Education  [place]

## 2022-08-03 NOTE — Progress Notes (Signed)
Cardiology Office Note:    Date:  08/03/2022   ID:  Justin Cohen, DOB 1946-08-26, MRN [id number]  PCP:  Antony Contras, MD   [facility] Providers Cardiologist:  Candee Furbish, MD     Referring MD: Antony Contras, MD   CC: Here for 6 month follow-up appointment  History of Present Illness:    Justin Cohen is a 76 y.o. male with a hx of the following:  HTN CAD, status post DES to LAD in April 2022 Hyperlipidemia Ischemic CM (LVEF 45 to 50%, inferior septal hypokinesis) Chronic systolic CHF OSA Q0GQ Morbid obesity History of prostate cancer  Was admitted in April 2022 with unstable angina.  Echocardiogram revealed EF 45 to 50%, anteroseptal and apical AK and inferior-septal and inferior HK.  Cardiac cath revealed critical ostial LAD disease, treated with drug-eluting stent.  Has a history of intolerance to beta-blocker in the past due to fatigue.  Last seen by Dr. Marlou Porch in March 2023 and reported lots of bruising, was requesting to come off [place].  NST arranged due to having some indigestion, similar symptoms he had prior to his stent, and was found to be normal in January 2023.  Brilinta stopped and aspirin continued.  Crestor 10 mg daily was prescribed.  Overall he is doing well and denies any cardiac complaints or issues, however he said he has gained weight due to poor eating habits and physical inactivity.  He was down to 230 pounds before and weight today on our scale is around 270 lbs.  He says he knows what he needs to do to lose weight, as he is done it before.  He defers any referral to healthy weight and wellness or prep program at this time.  Today he denies any chest pain, shortness of breath, palpitations, swelling, PND, orthopnea, bleeding, dizziness, syncope, presyncope, or claudication.  Says he is planning on getting back to the gym and working on the bikes and machines.  BP is well controlled at home.  He is wearing his CPAP every night.  His oral  surgeon has removed a spot on his tongue that has been evaluated for cancer, and is speaking with him regarding a oral device that is 80% effective for patients with OSA, he is on the wait list for this. Denies any other questions or concerns.    Past Medical History:  Diagnosis Date   Arthritis    Basal cell carcinoma of nose    CAD (coronary artery disease)    Admx with [place] 4/22 >> S/p DES to pLAD 6/76   Complication of anesthesia    Diabetes Mellitus Type 2    TYPE 2    Environmental allergies    Gastroesophageal reflux disease    Glaucoma    Hyperlipidemia    Hypertension    Ischemic cardiomyopathy    Echocardiogram 4/22: EF 45-50, ant-sept and apical AK, inf-sept and inf HK   Melanoma ([place])    Prostate cancer ([place])    Sinus problem    Sleep apnea    cpap- setting at 10     Past Surgical History:  Procedure Laterality Date   BUNIONECTOMY  2014   right foot   CHOLECYSTECTOMY N/A 03/12/2015   Procedure: LAPAROSCOPIC CHOLECYSTECTOMY CHOLANGIOGRAM WAS NOT PERFORMED;  Surgeon: Ralene Ok, MD;  Location: [facility];  Service: General;  Laterality: N/A;   CORONARY BALLOON ANGIOPLASTY N/A 02/08/2021   Procedure: CORONARY BALLOON ANGIOPLASTY;  Surgeon: Sherren Mocha, MD;  Location: [facility];  Service: Cardiovascular;  Laterality: N/A;   ERCP N/A 03/08/2015   Procedure: ENDOSCOPIC RETROGRADE CHOLANGIOPANCREATOGRAPHY (ERCP);  Surgeon: Inda Castle, MD;  Location: [facility];  Service: Gastroenterology;  Laterality: N/A;   ERCP N/A 03/10/2015   Procedure: ENDOSCOPIC RETROGRADE CHOLANGIOPANCREATOGRAPHY (ERCP);  Surgeon: Clarene Essex, MD;  Location: [facility];  Service: Endoscopy;  Laterality: N/A;   ERCP N/A 05/21/2015   Procedure: ENDOSCOPIC RETROGRADE CHOLANGIOPANCREATOGRAPHY (ERCP) with spyglass and stent removal;  Surgeon: Clarene Essex, MD;  Location: [facility];  Service: Endoscopy;  Laterality: N/A;   HERNIA REPAIR     HERNIA REPAIR  2010   JOINT REPLACEMENT  02/17/15    left knee replacement   JOINT REPLACEMENT  2001   right knee replacement   JOINT REPLACEMENT  2012   "right knee cap replacement"   KNEE SURGERY     X 2   LEFT HEART CATH AND CORONARY ANGIOGRAPHY N/A 02/08/2021   Procedure: LEFT HEART CATH AND CORONARY ANGIOGRAPHY;  Surgeon: Sherren Mocha, MD;  Location: [facility];  Service: Cardiovascular;  Laterality: N/A;   melanoma removal  Dec 2015   removed from [place]  2009   due to prostate cancer   TOTAL HIP ARTHROPLASTY Right 11/21/2017   Procedure: RIGHT TOTAL HIP ARTHROPLASTY ANTERIOR APPROACH;  Surgeon: Paralee Cancel, MD;  Location: [facility];  Service: Orthopedics;  Laterality: Right;  70 mins   TOTAL KNEE ARTHROPLASTY Left 02/17/2015   Procedure: LEFT TOTAL KNEE ARTHROPLASTY;  Surgeon: Paralee Cancel, MD;  Location: [facility];  Service: Orthopedics;  Laterality: Left;    Current Medications: Current Meds  Medication Sig   aspirin EC 81 MG tablet Take 81 mg by mouth daily. Swallow whole.   B Complex CAPS See admin instructions.   cetirizine (ZYRTEC) 10 MG tablet Take 10 mg by mouth daily.   Cholecalciferol (VITAMIN D) 2000 UNITS tablet Take 2,000 Units by mouth daily.   Cyanocobalamin (VITAMIN B12) 1000 MCG TBCR 1 tablet   fluticasone (FLONASE) 50 MCG/ACT nasal spray Place 2 sprays into both nostrils daily.   latanoprost (XALATAN) 0.005 % ophthalmic solution One drop in each eye nightly.   losartan (COZAAR) 100 MG tablet TAKE 1 TABLET EVERY DAY   metFORMIN (GLUCOPHAGE) 1000 MG tablet Take 1,000 mg by mouth 2 (two) times daily with a meal.   Multiple Vitamin (MULTIVITAMIN WITH MINERALS) TABS tablet Take 1 tablet by mouth daily.    nitroGLYCERIN (NITROSTAT) 0.4 MG SL tablet Place 1 tablet (0.4 mg total) under the tongue every 5 (five) minutes as needed for chest pain.   Omega-3 Fatty Acids (FISH OIL) 1000 MG CAPS Take 1,000 mg by mouth daily.    pioglitazone (ACTOS) 45 MG tablet Take 45 mg by mouth  daily.    Probiotic Product (PROBIOTIC DAILY PO) Take 1 capsule by mouth daily.   rosuvastatin (CRESTOR) 20 MG tablet Take 1 tablet (20 mg total) by mouth daily.   venlafaxine XR (EFFEXOR-XR) 75 MG 24 hr capsule Take 75 mg by mouth daily with breakfast.      Allergies:   Beta adrenergic blockers   Social History   Socioeconomic History   Marital status: Married    Spouse name: Not on file   Number of children: Not on file   Years of education: Not on file   Highest education level: Not on file  Occupational History   Not on file  Tobacco Use  Smoking status: Former    Types: Cigarettes    Quit date: 03/05/1973    Years since quitting: 49.4   Smokeless tobacco: Never  Vaping Use   Vaping Use: Never used  Substance and Sexual Activity   Alcohol use: Yes    Comment: occasional glass of wine    Drug use: No   Sexual activity: Not on file  Other Topics Concern   Not on file  Social History Narrative   ** Merged History Encounter **       Social Determinants of Health   Financial Resource Strain: Not on file  Food Insecurity: Not on file  Transportation Needs: Not on file  Physical Activity: Not on file  Stress: Not on file  Social Connections: Not on file     Family History: The patient's family history includes CAD in his father; Heart failure in his mother; Lung cancer in his sister.  ROS:   Review of Systems  Constitutional: Negative.        Weight gain due to poor eating habits and physical inactivity.  HENT: Negative.    Eyes: Negative.   Respiratory: Negative.    Cardiovascular: Negative.   Gastrointestinal: Negative.   Genitourinary: Negative.   Musculoskeletal: Negative.   Skin: Negative.   Neurological: Negative.   Endo/Heme/Allergies: Negative.   Psychiatric/Behavioral: Negative.      Please see the history of present illness.    All other systems reviewed and are negative.  EKGs/Labs/Other Studies Reviewed:    The following studies were  reviewed today:   EKG:  EKG is ordered today.  The ekg ordered today demonstrates sinus rhythm with sinus arrhythmia, 69 bpm, otherwise nothing acute.  Nuclear medicine stress test on November 15, 2021:  The study is normal. The study is low risk.   No ST deviation was noted.   LV perfusion is normal.   Left ventricular function is normal. Nuclear stress EF: 67 %. The left ventricular ejection fraction is hyperdynamic (>65%). End diastolic cavity size is normal.   Left heart cath and coronary angiography on February 08, 2021: Ost LAD to Prox LAD lesion is 95% stenosed. A drug-eluting stent was successfully placed using a SYNERGY XD 3.50X16. Post intervention, there is a 0% residual stenosis. Prox RCA lesion is 50% stenosed. Mid LM to Dist LM lesion is 30% stenosed.   1.  Critical ostial/proximal LAD stenosis, treated successfully with PCI using a 3.5 x 16 mm Synergy DES 2.  Patent left main and left circumflex with mild nonobstructive stenosis 3.  Nonobstructive mid RCA stenosis, estimated at 40 to 50%   Recommend: Dual antiplatelet therapy with aspirin and ticagrelor x12 months (ACS class I recommendation), aggressive medical therapy and risk reduction.  As long as no complications arise, patient eligible for hospital discharge tomorrow morning.    2D echocardiogram on February 07, 2021: 1. Left ventricular ejection fraction, by estimation, is 45 to 50%. The  left ventricle has mildly decreased function. The left ventricle  demonstrates regional wall motion abnormalities (see scoring  diagram/findings for description). There is mild left  ventricular hypertrophy of the basal segment. Left ventricular diastolic  parameters are consistent with Grade I diastolic dysfunction (impaired  relaxation).   2. Right ventricular systolic function is normal. The right ventricular  size is normal.   3. The mitral valve is normal in structure. No evidence of mitral valve  regurgitation. No evidence of  mitral stenosis.   4. The aortic valve is tricuspid. There is  mild calcification of the  aortic valve. There is mild thickening of the aortic valve. Aortic valve  regurgitation is not visualized. Mild aortic valve sclerosis is present,  with no evidence of aortic valve  stenosis.   5. The inferior vena cava is normal in size with greater than 50%  respiratory variability, suggesting right atrial pressure of 3 mmHg.  Recent Labs: 06/21/2022: ALT 45  Recent Lipid Panel    Component Value Date/Time   CHOL 128 06/21/2022 0847   TRIG 114 06/21/2022 0847   HDL 52 06/21/2022 0847   CHOLHDL 2.5 06/21/2022 0847   CHOLHDL 3.4 02/08/2021 0129   VLDL 31 02/08/2021 0129   LDLCALC 55 06/21/2022 0847     Physical Exam:    VS:  BP 122/66   Pulse 69   Ht '5\' 8"'$  (1.727 m)   Wt 269 lb 9.6 oz (122.3 kg)   SpO2 94%   BMI 40.99 kg/m     Wt Readings from Last 3 Encounters:  08/03/22 269 lb 9.6 oz (122.3 kg)  01/12/22 259 lb 9.6 oz (117.8 kg)  11/15/21 240 lb (108.9 kg)     GEN: Obese, 76 y.o. Caucasian male in no acute distress  HEENT: Normal NECK: No JVD; No carotid bruits  CARDIAC: S1/S2, RRR, no murmurs, rubs, gallops; 2+ peripheral pulses throughout, strong and equal bilaterally RESPIRATORY:  Clear and diminished to auscultation without rales, wheezing or rhonchi  MUSCULOSKELETAL:  No edema; No deformity  SKIN: Warm and dry NEUROLOGIC:  Alert and oriented x 3 PSYCHIATRIC:  Normal affect   ASSESSMENT:    1. Coronary artery disease involving native coronary artery of native heart without angina pectoris   2. Hyperlipidemia, unspecified hyperlipidemia type   3. Chronic systolic heart failure ([place])   4. Ischemic cardiomyopathy   5. Primary hypertension   6. OSA on CPAP   7. Morbid obesity ([place])    PLAN:    In order of problems listed above:  CAD, status post DES to LAD in 2022, hyperlipidemia Stable with no anginal symptoms. No indication for ischemic evaluation.  LDL 55 in  August 2023.  Continue aspirin, nitroglycerin as needed, Crestor, omega-3 fatty acid, and losartan. ED precautions discussed.   2. Chronic systolic heart failure, ischemic cardiomyopathy Euvolemic and well compensated on exam. Low sodium diet, fluid restriction <2L, and daily weights encouraged. Educated to contact our office for weight gain of 2 lbs overnight or 5 lbs in one week.  Continue current medication regimen.  3. Hypertension BP today 122/66.  BP well controlled at home. Discussed to monitor BP at home at least 2 hours after medications and sitting for 5-10 minutes.  Continue losartan.   4. OSA on CPAP Compliant usage at night. Continue to follow with PCP and Ortho surgeon.  5. Morbid obesity Weight loss via diet and exercise encouraged. Discussed the impact being overweight would have on cardiovascular risk.  Defers referral to PREP program and healthy weight and wellness clinic at this time.  6. Disposition: Follow-up with Dr. Marlou Porch in 6 months or sooner if anything changes.    Medication Adjustments/Labs and Tests Ordered: Current medicines are reviewed at length with the patient today.  Concerns regarding medicines are outlined above.  Orders Placed This Encounter  Procedures   EKG 12-Lead   No orders of the defined types were placed in this encounter.   Patient Instructions  Medication Instructions:   Your physician recommends that you continue on your current medications as directed. Please  refer to the Current Medication list given to you today.   *If you need a refill on your cardiac medications before your next appointment, please call your pharmacy*   Lab Work:  None ordered.  If you have labs (blood work) drawn today and your tests are completely normal, you will receive your results only by: [place] (if you have MyChart) OR A paper copy in the mail If you have any lab test that is abnormal or we need to change your treatment, we will call you  to review the results.   Testing/Procedures:  None ordered.   Follow-Up: At [facility], you and your health needs are our priority.  As part of our continuing mission to provide you with exceptional heart care, we have created designated Provider Care Teams.  These Care Teams include your primary Cardiologist (physician) and Advanced Practice Providers (APPs -  Physician Assistants and Nurse Practitioners) who all work together to provide you with the care you need, when you need it.  We recommend signing up for the patient portal called "MyChart".  Sign up information is provided on this After Visit Summary.  MyChart is used to connect with patients for Virtual Visits (Telemedicine).  Patients are able to view lab/test results, encounter notes, upcoming appointments, etc.  Non-urgent messages can be sent to your provider as well.   To learn more about what you can do with MyChart, go to [email or web address].    Your next appointment:   6 month(s)  The format for your next appointment:   In Person  Provider:   Candee Furbish, MD     Other Instructions  Daily Weight Record It is important to weigh yourself daily. To do this: Make sure you use a reliable scale. Use the same scale each day. Keep this daily weight chart near your scale. Weigh yourself each morning at the same time after you use the bathroom. Before weighing yourself: Take off your shoes. Make sure you are wearing the same amount of clothing each day. Write down your weight in the spaces on the form. Compare today's weight to yesterday's weight. Bring this form with you to your follow-up visits with your health care provider. Call your health care provider if you have concerns about your weight, including rapid weight gain or loss. Date: ________ Weight: ____________________ Date: ________ Weight: ____________________ Date: ________ Weight: ____________________ Date: ________ Weight:  ____________________ Date: ________ Weight: ____________________ Date: ________ Weight: ____________________ Date: ________ Weight: ____________________ Date: ________ Weight: ____________________ Date: ________ Weight: ____________________ Date: ________ Weight: ____________________ Date: ________ Weight: ____________________ Date: ________ Weight: ____________________ Date: ________ Weight: ____________________ Date: ________ Weight: ____________________ Date: ________ Weight: ____________________ Date: ________ Weight: ____________________ Date: ________ Weight: ____________________ Date: ________ Weight: ____________________ Date: ________ Weight: ____________________ Date: ________ Weight: ____________________ Date: ________ Weight: ____________________ Date: ________ Weight: ____________________ Date: ________ Weight: ____________________ Date: ________ Weight: ____________________ Date: ________ Weight: ____________________ Date: ________ Weight: ____________________ Date: ________ Weight: ____________________ Date: ________ Weight: ____________________ Date: ________ Weight: ____________________ Date: ________ Weight: ____________________ Date: ________ Weight: ____________________ Date: ________ Weight: ____________________ Date: ________ Weight: ____________________ Date: ________ Weight: ____________________ Date: ________ Weight: ____________________ Date: ________ Weight: ____________________ Date: ________ Weight: ____________________ Date: ________ Weight: ____________________ Date: ________ Weight: ____________________ Date: ________ Weight: ____________________ Date: ________ Weight: ____________________ Date: ________ Weight: ____________________ Date: ________ Weight: ____________________ Date: ________ Weight: ____________________ Date: ________ Weight: ____________________ Date: ________ Weight: ____________________ Date: ________ Weight: ____________________ Date: ________  Weight: ____________________ Date: ________ Weight: ____________________ Date: ________ Weight: ____________________ This  information is not intended to replace advice given to you by your health care provider. Make sure you discuss any questions you have with your health care provider. Document Revised: 06/29/2021 Document Reviewed: 06/29/2021 Elsevier Patient Education  [place].  Low-Sodium Eating Plan Sodium, which is an element that makes up salt, helps you maintain a healthy balance of fluids in your body. Too much sodium can increase your blood pressure and cause fluid and waste to be held in your body. Your health care provider or dietitian may recommend following this plan if you have high blood pressure (hypertension), kidney disease, liver disease, or heart failure. Eating less sodium can help lower your blood pressure, reduce swelling, and protect your heart, liver, and kidneys. What are tips for following this plan? Reading food labels The Nutrition Facts label lists the amount of sodium in one serving of the food. If you eat more than one serving, you must multiply the listed amount of sodium by the number of servings. Choose foods with less than 140 mg of sodium per serving. Avoid foods with 300 mg of sodium or more per serving. Shopping  Look for lower-sodium products, often labeled as "low-sodium" or "no salt added." Always check the sodium content, even if foods are labeled as "unsalted" or "no salt added." Buy fresh foods. Avoid canned foods and pre-made or frozen meals. Avoid canned, cured, or processed meats. Buy breads that have less than 80 mg of sodium per slice. Cooking  Eat more home-cooked food and less restaurant, buffet, and fast food. Avoid adding salt when cooking. Use salt-free seasonings or herbs instead of table salt or sea salt. Check with your health care provider or pharmacist before using salt substitutes. Cook with plant-based oils, such as  canola, sunflower, or olive oil. Meal planning When eating at a restaurant, ask that your food be prepared with less salt or no salt, if possible. Avoid dishes labeled as brined, pickled, cured, smoked, or made with soy sauce, miso, or teriyaki sauce. Avoid foods that contain MSG (monosodium glutamate). MSG is sometimes added to [place] food, bouillon, and some canned foods. Make meals that can be grilled, baked, poached, roasted, or steamed. These are generally made with less sodium. General information Most people on this plan should limit their sodium intake to 1,500-2,000 mg (milligrams) of sodium each day. What foods should I eat? Fruits Fresh, frozen, or canned fruit. Fruit juice. Vegetables Fresh or frozen vegetables. "No salt added" canned vegetables. "No salt added" tomato sauce and paste. Low-sodium or reduced-sodium tomato and vegetable juice. Grains Low-sodium cereals, including oats, puffed wheat and rice, and shredded wheat. Low-sodium crackers. Unsalted rice. Unsalted pasta. Low-sodium bread. Whole-grain breads and whole-grain pasta. Meats and other proteins Fresh or frozen (no salt added) meat, poultry, seafood, and fish. Low-sodium canned tuna and salmon. Unsalted nuts. Dried peas, beans, and lentils without added salt. Unsalted canned beans. Eggs. Unsalted nut butters. Dairy Milk. Soy milk. Cheese that is naturally low in sodium, such as ricotta cheese, fresh mozzarella, or Swiss cheese. Low-sodium or reduced-sodium cheese. Cream cheese. Yogurt. Seasonings and condiments Fresh and dried herbs and spices. Salt-free seasonings. Low-sodium mustard and ketchup. Sodium-free salad dressing. Sodium-free light mayonnaise. Fresh or refrigerated horseradish. Lemon juice. Vinegar. Other foods Homemade, reduced-sodium, or low-sodium soups. Unsalted popcorn and pretzels. Low-salt or salt-free chips. The items listed above may not be a complete list of foods and beverages you can eat.  Contact a dietitian for more information. What foods should I avoid?  Vegetables Sauerkraut, pickled vegetables, and relishes. Olives. [place] fries. Onion rings. Regular canned vegetables (not low-sodium or reduced-sodium). Regular canned tomato sauce and paste (not low-sodium or reduced-sodium). Regular tomato and vegetable juice (not low-sodium or reduced-sodium). Frozen vegetables in sauces. Grains Instant hot cereals. Bread stuffing, pancake, and biscuit mixes. Croutons. Seasoned rice or pasta mixes. Noodle soup cups. Boxed or frozen macaroni and cheese. Regular salted crackers. Self-rising flour. Meats and other proteins Meat or fish that is salted, canned, smoked, spiced, or pickled. Precooked or cured meat, such as sausages or meat loaves. [place] Salines. Ham. Pepperoni. Hot dogs. Corned beef. Chipped beef. Salt pork. Jerky. Pickled herring. Anchovies and sardines. Regular canned tuna. Salted nuts. Dairy Processed cheese and cheese spreads. Hard cheeses. Cheese curds. Blue cheese. Feta cheese. String cheese. Regular cottage cheese. Buttermilk. Canned milk. Fats and oils Salted butter. Regular margarine. Ghee. Bacon fat. Seasonings and condiments Onion salt, garlic salt, seasoned salt, table salt, and sea salt. Canned and packaged gravies. Worcestershire sauce. Tartar sauce. Barbecue sauce. Teriyaki sauce. Soy sauce, including reduced-sodium. Steak sauce. Fish sauce. Oyster sauce. Cocktail sauce. Horseradish that you find on the shelf. Regular ketchup and mustard. Meat flavorings and tenderizers. Bouillon cubes. Hot sauce. Pre-made or packaged marinades. Pre-made or packaged taco seasonings. Relishes. Regular salad dressings. Salsa. Other foods Salted popcorn and pretzels. Corn chips and puffs. Potato and tortilla chips. Canned or dried soups. Pizza. Frozen entrees and pot pies. The items listed above may not be a complete list of foods and beverages you should avoid. Contact a dietitian for more  information. Summary Eating less sodium can help lower your blood pressure, reduce swelling, and protect your heart, liver, and kidneys. Most people on this plan should limit their sodium intake to 1,500-2,000 mg (milligrams) of sodium each day. Canned, boxed, and frozen foods are high in sodium. Restaurant foods, fast foods, and pizza are also very high in sodium. You also get sodium by adding salt to food. Try to cook at home, eat more fresh fruits and vegetables, and eat less fast food and canned, processed, or prepared foods. This information is not intended to replace advice given to you by your health care provider. Make sure you discuss any questions you have with your health care provider. Document Revised: 11/29/2019 Document Reviewed: 09/25/2019 Elsevier Patient Education  [place]         Signed, Finis Bud, NP  08/03/2022 3:53 PM    [place]

## 2022-08-30 DIAGNOSIS — U071 COVID-19: Secondary | ICD-10-CM | POA: Diagnosis not present

## 2022-10-26 ENCOUNTER — Other Ambulatory Visit: Payer: Self-pay | Admitting: Gastroenterology

## 2022-10-26 DIAGNOSIS — Z86010 Personal history of colonic polyps: Secondary | ICD-10-CM

## 2022-10-26 DIAGNOSIS — Q438 Other specified congenital malformations of intestine: Secondary | ICD-10-CM

## 2022-11-10 ENCOUNTER — Telehealth: Payer: Self-pay | Admitting: Cardiology

## 2022-11-10 NOTE — Telephone Encounter (Signed)
Pt c/o of Chest Pain: STAT if CP now or developed within 24 hours  1. Are you having CP right now? Unsure, pt is asleep   2. Are you experiencing any other symptoms (ex. SOB, nausea, vomiting, sweating)? Belching, acid reflux, upset stomach/cramps, ans hearing noises that aren't in the house   3. How long have you been experiencing CP? 4-5 days   4. Is your CP continuous or coming and going? Continuous   5. Have you taken Nitroglycerin? No    Wife is calling requesting an appt for pt due to chest pressure on the left side. She reports these symptoms remind her of when he had to get a stent. Spoke with triage who stated Pam will return call shortly.  ?

## 2022-11-10 NOTE — Telephone Encounter (Signed)
Wife called back and pt was awake then.  Pt reports he has been having chest pressure on the left side for approximately 5 days that he describes as a constant, dull ache.  He further describes it as a pressure and not a sharp pain.  Denies any SOB/cough, diaphoresis.  Does have belching and indigestion.  He has been eating/drinking ok, no n/v.  Nothing makes the pressure worse or better such as activity or rest.  Pt has not tried using SL ntg, Maalox etc. For relief.  He and his wife report the s/s are much like when he went to the ED 4/22 and have to having stenting.  Advised pt to use SL Ntg - reviewed instructions to use with both pt and wife.  Advised to call 911 for transport to ED for further evaluation if no relief.  Pt has been scheduled to f/u with Dr Marlou Porch on Monday if he does not report to ED.

## 2022-11-10 NOTE — Telephone Encounter (Signed)
Attempted to contact pt - left voicemail to call back (OK per DPR)

## 2022-11-14 ENCOUNTER — Encounter: Payer: Self-pay | Admitting: Cardiology

## 2022-11-14 ENCOUNTER — Ambulatory Visit: Payer: Medicare HMO | Attending: Cardiology | Admitting: Cardiology

## 2022-11-14 VITALS — BP 104/70 | HR 82 | Ht 68.0 in | Wt 268.0 lb

## 2022-11-14 DIAGNOSIS — I2510 Atherosclerotic heart disease of native coronary artery without angina pectoris: Secondary | ICD-10-CM

## 2022-11-14 DIAGNOSIS — I2583 Coronary atherosclerosis due to lipid rich plaque: Secondary | ICD-10-CM

## 2022-11-14 DIAGNOSIS — R0789 Other chest pain: Secondary | ICD-10-CM | POA: Diagnosis not present

## 2022-11-14 NOTE — Patient Instructions (Signed)
Medication Instructions:  The current medical regimen is effective;  continue present plan and medications.  *If you need a refill on your cardiac medications before your next appointment, please call your pharmacy*  Follow-Up: At [facility], you and your health needs are our priority.  As part of our continuing mission to provide you with exceptional heart care, we have created designated Provider Care Teams.  These Care Teams include your primary Cardiologist (physician) and Advanced Practice Providers (APPs -  Physician Assistants and Nurse Practitioners) who all work together to provide you with the care you need, when you need it.  We recommend signing up for the patient portal called "MyChart".  Sign up information is provided on this After Visit Summary.  MyChart is used to connect with patients for Virtual Visits (Telemedicine).  Patients are able to view lab/test results, encounter notes, upcoming appointments, etc.  Non-urgent messages can be sent to your provider as well.   To learn more about what you can do with MyChart, go to [email or web address].    Your next appointment:   1 year(s)  The format for your next appointment:   In Person  Provider:   Mark Skains, MD      Important Information About Sugar       

## 2022-11-14 NOTE — Progress Notes (Signed)
Cardiology Office Note:    Date:  11/14/2022   ID:  Justin Cohen, DOB 12-29-1945, MRN [id number]  PCP:  Antony Contras, MD   [facility] Providers Cardiologist:  Candee Furbish, MD     Referring MD: Antony Contras, MD    History of Present Illness:    Justin Cohen is a 77 y.o. male here for follow-up coronary artery disease with drug-eluting stent to the LAD in April 2022.  Proximal region.  He had an ischemic cardiomyopathy with EF 45 to 50% inferior septal hypokinesis.  Has been intolerant of beta-blocker in the past because of fatigue.  Also has diabetes hypertension hyperlipidemia and prostate cancer.  Lots of bruising. Came off Brilinta.   Had GERD like symptoms. Had some more indigestion. NUC stress 1/23 looked normal.   A week ago had indigestion and heart was light ache left upper chest. Not sharp. Constant ache. Took NTG. Was hearing a bumping in his ear. Debbie wife did not hear it. HA.   Past Medical History:  Diagnosis Date   Arthritis    Basal cell carcinoma of nose    CAD (coronary artery disease)    Admx with [place] 4/22 >> S/p DES to pLAD 8/46   Complication of anesthesia    Diabetes Mellitus Type 2    TYPE 2    Environmental allergies    Gastroesophageal reflux disease    Glaucoma    Hyperlipidemia    Hypertension    Ischemic cardiomyopathy    Echocardiogram 4/22: EF 45-50, ant-sept and apical AK, inf-sept and inf HK   Melanoma ([place])    Prostate cancer ([place])    Sinus problem    Sleep apnea    cpap- setting at 10     Past Surgical History:  Procedure Laterality Date   BUNIONECTOMY  2014   right foot   CHOLECYSTECTOMY N/A 03/12/2015   Procedure: LAPAROSCOPIC CHOLECYSTECTOMY CHOLANGIOGRAM WAS NOT PERFORMED;  Surgeon: Ralene Ok, MD;  Location: [facility];  Service: General;  Laterality: N/A;   CORONARY BALLOON ANGIOPLASTY N/A 02/08/2021   Procedure: CORONARY BALLOON ANGIOPLASTY;  Surgeon: Sherren Mocha, MD;  Location: [facility];  Service:  Cardiovascular;  Laterality: N/A;   ERCP N/A 03/08/2015   Procedure: ENDOSCOPIC RETROGRADE CHOLANGIOPANCREATOGRAPHY (ERCP);  Surgeon: Inda Castle, MD;  Location: [facility];  Service: Gastroenterology;  Laterality: N/A;   ERCP N/A 03/10/2015   Procedure: ENDOSCOPIC RETROGRADE CHOLANGIOPANCREATOGRAPHY (ERCP);  Surgeon: Clarene Essex, MD;  Location: [facility];  Service: Endoscopy;  Laterality: N/A;   ERCP N/A 05/21/2015   Procedure: ENDOSCOPIC RETROGRADE CHOLANGIOPANCREATOGRAPHY (ERCP) with spyglass and stent removal;  Surgeon: Clarene Essex, MD;  Location: [facility];  Service: Endoscopy;  Laterality: N/A;   HERNIA REPAIR     HERNIA REPAIR  2010   JOINT REPLACEMENT  02/17/15   left knee replacement   JOINT REPLACEMENT  2001   right knee replacement   JOINT REPLACEMENT  2012   "right knee cap replacement"   KNEE SURGERY     X 2   LEFT HEART CATH AND CORONARY ANGIOGRAPHY N/A 02/08/2021   Procedure: LEFT HEART CATH AND CORONARY ANGIOGRAPHY;  Surgeon: Sherren Mocha, MD;  Location: [facility];  Service: Cardiovascular;  Laterality: N/A;   melanoma removal  Dec 2015   removed from [place]  2009   due to prostate cancer   TOTAL HIP ARTHROPLASTY Right 11/21/2017   Procedure: RIGHT TOTAL  HIP ARTHROPLASTY ANTERIOR APPROACH;  Surgeon: Paralee Cancel, MD;  Location: [facility];  Service: Orthopedics;  Laterality: Right;  70 mins   TOTAL KNEE ARTHROPLASTY Left 02/17/2015   Procedure: LEFT TOTAL KNEE ARTHROPLASTY;  Surgeon: Paralee Cancel, MD;  Location: [facility];  Service: Orthopedics;  Laterality: Left;    Current Medications: Current Meds  Medication Sig   aspirin EC 81 MG tablet Take 81 mg by mouth daily. Swallow whole.   B Complex CAPS See admin instructions.   cetirizine (ZYRTEC) 10 MG tablet Take 10 mg by mouth daily.   Cholecalciferol (VITAMIN D) 2000 UNITS tablet Take 2,000 Units by mouth daily.   Cyanocobalamin (VITAMIN B12) 1000 MCG TBCR 1 tablet    fluticasone (FLONASE) 50 MCG/ACT nasal spray Place 2 sprays into both nostrils daily.   latanoprost (XALATAN) 0.005 % ophthalmic solution One drop in each eye nightly.   losartan (COZAAR) 100 MG tablet TAKE 1 TABLET EVERY DAY   metFORMIN (GLUCOPHAGE) 1000 MG tablet Take 1,000 mg by mouth 2 (two) times daily with a meal.   Multiple Vitamin (MULTIVITAMIN WITH MINERALS) TABS tablet Take 1 tablet by mouth daily.    nitroGLYCERIN (NITROSTAT) 0.4 MG SL tablet Place 1 tablet (0.4 mg total) under the tongue every 5 (five) minutes as needed for chest pain.   Omega-3 Fatty Acids (FISH OIL) 1000 MG CAPS Take 1,000 mg by mouth daily.    pioglitazone (ACTOS) 45 MG tablet Take 45 mg by mouth daily.    Probiotic Product (PROBIOTIC DAILY PO) Take 1 capsule by mouth daily.   rosuvastatin (CRESTOR) 20 MG tablet Take 1 tablet (20 mg total) by mouth daily.   venlafaxine XR (EFFEXOR-XR) 75 MG 24 hr capsule Take 75 mg by mouth daily with breakfast.      Allergies:   Beta adrenergic blockers   Social History   Socioeconomic History   Marital status: Married    Spouse name: Not on file   Number of children: Not on file   Years of education: Not on file   Highest education level: Not on file  Occupational History   Not on file  Tobacco Use   Smoking status: Former    Types: Cigarettes    Quit date: 03/05/1973    Years since quitting: 49.7   Smokeless tobacco: Never  Vaping Use   Vaping Use: Never used  Substance and Sexual Activity   Alcohol use: Yes    Comment: occasional glass of wine    Drug use: No   Sexual activity: Not on file  Other Topics Concern   Not on file  Social History Narrative   ** Merged History Encounter **       Social Determinants of Health   Financial Resource Strain: Not on file  Food Insecurity: Not on file  Transportation Needs: Not on file  Physical Activity: Not on file  Stress: Not on file  Social Connections: Not on file     Family History: The patient's  family history includes CAD in his father; Heart failure in his mother; Lung cancer in his sister.  ROS:   Please see the history of present illness.     All other systems reviewed and are negative.  EKGs/Labs/Other Studies Reviewed:    The following studies were reviewed today:  EKG: 11/14/2022 normal sinus rhythm 82 no other abnormalities   LEFT HEART CATH 02/08/2021 Narrative  Ost LAD to Prox LAD lesion is 95% stenosed.  A drug-eluting stent was successfully placed using  a SYNERGY XD 3.50X16.  Post intervention, there is a 0% residual stenosis.  Prox RCA lesion is 50% stenosed.  Mid LM to Dist LM lesion is 30% stenosed. 1.  Critical ostial/proximal LAD stenosis, treated successfully with PCI using a 3.5 x 16 mm Synergy DES 2.  Patent left main and left circumflex with mild nonobstructive stenosis 3.  Nonobstructive mid RCA stenosis, estimated at 40 to 50%   Recommend: Dual antiplatelet therapy with aspirin and ticagrelor x12 months (ACS class I recommendation), aggressive medical therapy and risk reduction.  As long as no complications arise, patient eligible for hospital discharge tomorrow morning.   Echocardiogram 02/07/21 EF 45-50, ant-sept and apical AK, inf-sept and inf HK, mild LVH, Gr 1 DD, normal RVSF, AV sclerosis without stenosis   NUC stress 11/15/21:   The study is normal. The study is low risk.   No ST deviation was noted.   LV perfusion is normal.   Left ventricular function is normal. Nuclear stress EF: 67 %. The left ventricular ejection fraction is hyperdynamic (>65%). End diastolic cavity size is normal.   Recent Labs: 06/21/2022: ALT 45  Recent Lipid Panel    Component Value Date/Time   CHOL 128 06/21/2022 0847   TRIG 114 06/21/2022 0847   HDL 52 06/21/2022 0847   CHOLHDL 2.5 06/21/2022 0847   CHOLHDL 3.4 02/08/2021 0129   VLDL 31 02/08/2021 0129   LDLCALC 55 06/21/2022 0847     Risk Assessment/Calculations:              Physical Exam:     VS:  BP 104/70 (BP Location: Left Arm, Patient Position: Sitting, Cuff Size: Normal)   Pulse 82   Ht '5\' 8"'$  (1.727 m)   Wt 268 lb (121.6 kg)   BMI 40.75 kg/m     Wt Readings from Last 3 Encounters:  11/14/22 268 lb (121.6 kg)  08/03/22 269 lb 9.6 oz (122.3 kg)  01/12/22 259 lb 9.6 oz (117.8 kg)     GEN:  Well nourished, well developed in no acute distress HEENT: Normal NECK: No JVD; No carotid bruits LYMPHATICS: No lymphadenopathy CARDIAC: RRR, no murmurs, no rubs, gallops RESPIRATORY:  Clear to auscultation without rales, wheezing or rhonchi  ABDOMEN: Soft, non-tender, non-distended MUSCULOSKELETAL:  No edema; No deformity  SKIN: Warm and dry, bruises noted NEUROLOGIC:  Alert and oriented x 3 PSYCHIATRIC:  Normal affect   ASSESSMENT:    1. Chest discomfort   2. Coronary artery disease due to lipid rich plaque     PLAN:    In order of problems listed above:  CAD, status post DES to LAD in 2022, hyperlipidemia, chest pain No indication for ischemic evaluation. ECG OK. Sounds GI/MSK. Stress test 1 year ago OK.   LDL 55 in August 2023.  Continue aspirin, nitroglycerin as needed, Crestor, omega-3 fatty acid, and losartan. ED precautions discussed.    2. Chronic systolic heart failure, ischemic cardiomyopathy Euvolemic and well compensated on exam. Low sodium diet, fluid restriction <2L, and daily weights encouraged. Educated to contact our office for weight gain of 2 lbs overnight or 5 lbs in one week.  Continue current medication regimen.   3. Hypertension BP today 122/66.  BP well controlled at home. Discussed to monitor BP at home at least 2 hours after medications and sitting for 5-10 minutes.  Continue losartan. No changes   4. OSA on CPAP Compliant usage at night.    5. Morbid obesity Weight loss via diet and exercise  encouraged. Discussed the impact being overweight would have on cardiovascular risk.  Defers referral to PREP program and healthy weight and  wellness clinic.          Medication Adjustments/Labs and Tests Ordered: Current medicines are reviewed at length with the patient today.  Concerns regarding medicines are outlined above.  Orders Placed This Encounter  Procedures   EKG 12-Lead   No orders of the defined types were placed in this encounter.   Patient Instructions  Medication Instructions:  The current medical regimen is effective;  continue present plan and medications.  *If you need a refill on your cardiac medications before your next appointment, please call your pharmacy*  Follow-Up: At [facility], you and your health needs are our priority.  As part of our continuing mission to provide you with exceptional heart care, we have created designated Provider Care Teams.  These Care Teams include your primary Cardiologist (physician) and Advanced Practice Providers (APPs -  Physician Assistants and Nurse Practitioners) who all work together to provide you with the care you need, when you need it.  We recommend signing up for the patient portal called "MyChart".  Sign up information is provided on this After Visit Summary.  MyChart is used to connect with patients for Virtual Visits (Telemedicine).  Patients are able to view lab/test results, encounter notes, upcoming appointments, etc.  Non-urgent messages can be sent to your provider as well.   To learn more about what you can do with MyChart, go to [email or web address].    Your next appointment:   1 year(s)  The format for your next appointment:   In Person  Provider:   Candee Furbish, MD      Important Information About Sugar         Signed, Candee Furbish, MD  11/14/2022 10:24 AM    [place]

## 2022-11-14 NOTE — Telephone Encounter (Signed)
Pt in office today being evaluated by Dr Candee Furbish.  Please see that office note for further documentation.

## 2022-12-06 DIAGNOSIS — E1169 Type 2 diabetes mellitus with other specified complication: Secondary | ICD-10-CM | POA: Diagnosis not present

## 2022-12-06 DIAGNOSIS — I10 Essential (primary) hypertension: Secondary | ICD-10-CM | POA: Diagnosis not present

## 2022-12-06 DIAGNOSIS — J302 Other seasonal allergic rhinitis: Secondary | ICD-10-CM | POA: Diagnosis not present

## 2022-12-06 DIAGNOSIS — Z8546 Personal history of malignant neoplasm of prostate: Secondary | ICD-10-CM | POA: Diagnosis not present

## 2022-12-06 DIAGNOSIS — L4050 Arthropathic psoriasis, unspecified: Secondary | ICD-10-CM | POA: Diagnosis not present

## 2022-12-06 DIAGNOSIS — I700 Atherosclerosis of aorta: Secondary | ICD-10-CM | POA: Diagnosis not present

## 2022-12-06 DIAGNOSIS — E7800 Pure hypercholesterolemia, unspecified: Secondary | ICD-10-CM | POA: Diagnosis not present

## 2022-12-06 DIAGNOSIS — F419 Anxiety disorder, unspecified: Secondary | ICD-10-CM | POA: Diagnosis not present

## 2022-12-06 DIAGNOSIS — I25119 Atherosclerotic heart disease of native coronary artery with unspecified angina pectoris: Secondary | ICD-10-CM | POA: Diagnosis not present

## 2022-12-07 DIAGNOSIS — E785 Hyperlipidemia, unspecified: Secondary | ICD-10-CM | POA: Diagnosis not present

## 2022-12-07 DIAGNOSIS — I2510 Atherosclerotic heart disease of native coronary artery without angina pectoris: Secondary | ICD-10-CM | POA: Diagnosis not present

## 2022-12-07 DIAGNOSIS — E119 Type 2 diabetes mellitus without complications: Secondary | ICD-10-CM | POA: Diagnosis not present

## 2022-12-07 DIAGNOSIS — I10 Essential (primary) hypertension: Secondary | ICD-10-CM | POA: Diagnosis not present

## 2022-12-13 ENCOUNTER — Inpatient Hospital Stay: Admission: RE | Admit: 2022-12-13 | Payer: Medicare HMO | Source: Ambulatory Visit

## 2022-12-22 DIAGNOSIS — E119 Type 2 diabetes mellitus without complications: Secondary | ICD-10-CM | POA: Diagnosis not present

## 2022-12-22 DIAGNOSIS — I10 Essential (primary) hypertension: Secondary | ICD-10-CM | POA: Diagnosis not present

## 2022-12-22 DIAGNOSIS — E785 Hyperlipidemia, unspecified: Secondary | ICD-10-CM | POA: Diagnosis not present

## 2022-12-22 DIAGNOSIS — I2510 Atherosclerotic heart disease of native coronary artery without angina pectoris: Secondary | ICD-10-CM | POA: Diagnosis not present

## 2023-01-06 DIAGNOSIS — R319 Hematuria, unspecified: Secondary | ICD-10-CM | POA: Diagnosis not present

## 2023-01-06 DIAGNOSIS — N390 Urinary tract infection, site not specified: Secondary | ICD-10-CM | POA: Diagnosis not present

## 2023-01-20 ENCOUNTER — Ambulatory Visit: Payer: Medicare HMO | Admitting: Cardiology

## 2023-01-31 ENCOUNTER — Ambulatory Visit: Payer: Medicare HMO | Admitting: Neurology

## 2023-02-01 ENCOUNTER — Ambulatory Visit: Payer: Medicare HMO | Admitting: Neurology

## 2023-02-07 ENCOUNTER — Telehealth: Payer: Self-pay | Admitting: Neurology

## 2023-02-07 NOTE — Telephone Encounter (Signed)
LVM and sent mychart msg informing pt of appointment change - MD out 

## 2023-02-15 DIAGNOSIS — R350 Frequency of micturition: Secondary | ICD-10-CM | POA: Diagnosis not present

## 2023-02-22 ENCOUNTER — Encounter: Payer: Self-pay | Admitting: Neurology

## 2023-03-01 DIAGNOSIS — H52203 Unspecified astigmatism, bilateral: Secondary | ICD-10-CM | POA: Diagnosis not present

## 2023-03-01 DIAGNOSIS — H524 Presbyopia: Secondary | ICD-10-CM | POA: Diagnosis not present

## 2023-03-01 DIAGNOSIS — E119 Type 2 diabetes mellitus without complications: Secondary | ICD-10-CM | POA: Diagnosis not present

## 2023-03-01 DIAGNOSIS — Z961 Presence of intraocular lens: Secondary | ICD-10-CM | POA: Diagnosis not present

## 2023-03-01 DIAGNOSIS — Z7984 Long term (current) use of oral hypoglycemic drugs: Secondary | ICD-10-CM | POA: Diagnosis not present

## 2023-03-01 DIAGNOSIS — H401131 Primary open-angle glaucoma, bilateral, mild stage: Secondary | ICD-10-CM | POA: Diagnosis not present

## 2023-03-06 ENCOUNTER — Other Ambulatory Visit: Payer: Self-pay | Admitting: Cardiology

## 2023-03-14 ENCOUNTER — Ambulatory Visit: Payer: Medicare HMO | Admitting: Neurology

## 2023-03-14 DIAGNOSIS — E1169 Type 2 diabetes mellitus with other specified complication: Secondary | ICD-10-CM | POA: Diagnosis not present

## 2023-03-20 ENCOUNTER — Ambulatory Visit: Payer: Medicare HMO | Admitting: Neurology

## 2023-03-29 DIAGNOSIS — E782 Mixed hyperlipidemia: Secondary | ICD-10-CM | POA: Diagnosis not present

## 2023-03-29 DIAGNOSIS — E119 Type 2 diabetes mellitus without complications: Secondary | ICD-10-CM | POA: Diagnosis not present

## 2023-03-29 DIAGNOSIS — I10 Essential (primary) hypertension: Secondary | ICD-10-CM | POA: Diagnosis not present

## 2023-03-29 DIAGNOSIS — I2510 Atherosclerotic heart disease of native coronary artery without angina pectoris: Secondary | ICD-10-CM | POA: Diagnosis not present

## 2023-04-04 ENCOUNTER — Other Ambulatory Visit: Payer: Self-pay | Admitting: Cardiology

## 2023-04-12 DIAGNOSIS — B999 Unspecified infectious disease: Secondary | ICD-10-CM | POA: Diagnosis not present

## 2023-06-07 DIAGNOSIS — L218 Other seborrheic dermatitis: Secondary | ICD-10-CM | POA: Diagnosis not present

## 2023-06-07 DIAGNOSIS — L578 Other skin changes due to chronic exposure to nonionizing radiation: Secondary | ICD-10-CM | POA: Diagnosis not present

## 2023-06-07 DIAGNOSIS — L821 Other seborrheic keratosis: Secondary | ICD-10-CM | POA: Diagnosis not present

## 2023-06-07 DIAGNOSIS — Z85828 Personal history of other malignant neoplasm of skin: Secondary | ICD-10-CM | POA: Diagnosis not present

## 2023-06-07 DIAGNOSIS — D1801 Hemangioma of skin and subcutaneous tissue: Secondary | ICD-10-CM | POA: Diagnosis not present

## 2023-06-07 DIAGNOSIS — L570 Actinic keratosis: Secondary | ICD-10-CM | POA: Diagnosis not present

## 2023-06-07 DIAGNOSIS — L918 Other hypertrophic disorders of the skin: Secondary | ICD-10-CM | POA: Diagnosis not present

## 2023-07-03 DIAGNOSIS — Z0000 Encounter for general adult medical examination without abnormal findings: Secondary | ICD-10-CM | POA: Diagnosis not present

## 2023-07-03 DIAGNOSIS — Z8546 Personal history of malignant neoplasm of prostate: Secondary | ICD-10-CM | POA: Diagnosis not present

## 2023-07-03 DIAGNOSIS — Z1331 Encounter for screening for depression: Secondary | ICD-10-CM | POA: Diagnosis not present

## 2023-07-03 DIAGNOSIS — M26622 Arthralgia of left temporomandibular joint: Secondary | ICD-10-CM | POA: Diagnosis not present

## 2023-07-03 DIAGNOSIS — E1169 Type 2 diabetes mellitus with other specified complication: Secondary | ICD-10-CM | POA: Diagnosis not present

## 2023-07-03 DIAGNOSIS — F419 Anxiety disorder, unspecified: Secondary | ICD-10-CM | POA: Diagnosis not present

## 2023-07-03 DIAGNOSIS — Z23 Encounter for immunization: Secondary | ICD-10-CM | POA: Diagnosis not present

## 2023-07-03 DIAGNOSIS — Z9889 Other specified postprocedural states: Secondary | ICD-10-CM | POA: Diagnosis not present

## 2023-07-03 DIAGNOSIS — Z86002 Personal history of in-situ neoplasm of other and unspecified genital organs: Secondary | ICD-10-CM | POA: Diagnosis not present

## 2023-07-03 DIAGNOSIS — I10 Essential (primary) hypertension: Secondary | ICD-10-CM | POA: Diagnosis not present

## 2023-07-03 DIAGNOSIS — E7800 Pure hypercholesterolemia, unspecified: Secondary | ICD-10-CM | POA: Diagnosis not present

## 2023-07-03 DIAGNOSIS — E119 Type 2 diabetes mellitus without complications: Secondary | ICD-10-CM | POA: Diagnosis not present

## 2023-08-29 DIAGNOSIS — H524 Presbyopia: Secondary | ICD-10-CM | POA: Diagnosis not present

## 2023-08-29 DIAGNOSIS — H43812 Vitreous degeneration, left eye: Secondary | ICD-10-CM | POA: Diagnosis not present

## 2023-08-29 DIAGNOSIS — H26493 Other secondary cataract, bilateral: Secondary | ICD-10-CM | POA: Diagnosis not present

## 2023-08-29 DIAGNOSIS — H52203 Unspecified astigmatism, bilateral: Secondary | ICD-10-CM | POA: Diagnosis not present

## 2023-08-29 DIAGNOSIS — H401131 Primary open-angle glaucoma, bilateral, mild stage: Secondary | ICD-10-CM | POA: Diagnosis not present

## 2023-08-29 DIAGNOSIS — Z961 Presence of intraocular lens: Secondary | ICD-10-CM | POA: Diagnosis not present

## 2023-08-29 DIAGNOSIS — E119 Type 2 diabetes mellitus without complications: Secondary | ICD-10-CM | POA: Diagnosis not present

## 2023-10-02 DIAGNOSIS — L820 Inflamed seborrheic keratosis: Secondary | ICD-10-CM | POA: Diagnosis not present

## 2023-10-11 ENCOUNTER — Other Ambulatory Visit: Payer: Self-pay | Admitting: Cardiology

## 2023-10-17 DIAGNOSIS — I25119 Atherosclerotic heart disease of native coronary artery with unspecified angina pectoris: Secondary | ICD-10-CM | POA: Diagnosis not present

## 2023-10-17 DIAGNOSIS — Z23 Encounter for immunization: Secondary | ICD-10-CM | POA: Diagnosis not present

## 2023-10-17 DIAGNOSIS — I5022 Chronic systolic (congestive) heart failure: Secondary | ICD-10-CM | POA: Diagnosis not present

## 2023-10-17 DIAGNOSIS — E119 Type 2 diabetes mellitus without complications: Secondary | ICD-10-CM | POA: Diagnosis not present

## 2023-10-17 DIAGNOSIS — Z08 Encounter for follow-up examination after completed treatment for malignant neoplasm: Secondary | ICD-10-CM | POA: Diagnosis not present

## 2023-10-17 DIAGNOSIS — I110 Hypertensive heart disease with heart failure: Secondary | ICD-10-CM | POA: Diagnosis not present

## 2023-10-17 DIAGNOSIS — Z8546 Personal history of malignant neoplasm of prostate: Secondary | ICD-10-CM | POA: Diagnosis not present

## 2023-10-17 DIAGNOSIS — L4050 Arthropathic psoriasis, unspecified: Secondary | ICD-10-CM | POA: Diagnosis not present

## 2023-10-17 DIAGNOSIS — E7800 Pure hypercholesterolemia, unspecified: Secondary | ICD-10-CM | POA: Diagnosis not present

## 2023-10-17 DIAGNOSIS — E1169 Type 2 diabetes mellitus with other specified complication: Secondary | ICD-10-CM | POA: Diagnosis not present

## 2023-10-18 DIAGNOSIS — M25551 Pain in right hip: Secondary | ICD-10-CM | POA: Diagnosis not present

## 2023-10-18 DIAGNOSIS — Z96641 Presence of right artificial hip joint: Secondary | ICD-10-CM | POA: Diagnosis not present

## 2023-11-06 ENCOUNTER — Encounter (HOSPITAL_COMMUNITY): Payer: Self-pay

## 2023-11-06 ENCOUNTER — Other Ambulatory Visit: Payer: Self-pay

## 2023-11-06 ENCOUNTER — Emergency Department (HOSPITAL_COMMUNITY): Payer: Medicare HMO

## 2023-11-06 ENCOUNTER — Emergency Department (HOSPITAL_COMMUNITY)
Admission: EM | Admit: 2023-11-06 | Discharge: 2023-11-07 | Disposition: A | Discharge status: Home / Self Care | Payer: Medicare HMO | Attending: Emergency Medicine | Admitting: Emergency Medicine

## 2023-11-06 DIAGNOSIS — R5383 Other fatigue: Secondary | ICD-10-CM | POA: Diagnosis not present

## 2023-11-06 DIAGNOSIS — R531 Weakness: Secondary | ICD-10-CM | POA: Diagnosis not present

## 2023-11-06 DIAGNOSIS — Z79899 Other long term (current) drug therapy: Secondary | ICD-10-CM | POA: Diagnosis not present

## 2023-11-06 DIAGNOSIS — Z8546 Personal history of malignant neoplasm of prostate: Secondary | ICD-10-CM | POA: Insufficient documentation

## 2023-11-06 DIAGNOSIS — E1165 Type 2 diabetes mellitus with hyperglycemia: Secondary | ICD-10-CM | POA: Diagnosis not present

## 2023-11-06 DIAGNOSIS — N179 Acute kidney failure, unspecified: Secondary | ICD-10-CM | POA: Insufficient documentation

## 2023-11-06 DIAGNOSIS — I10 Essential (primary) hypertension: Secondary | ICD-10-CM | POA: Diagnosis not present

## 2023-11-06 DIAGNOSIS — Z85828 Personal history of other malignant neoplasm of skin: Secondary | ICD-10-CM | POA: Insufficient documentation

## 2023-11-06 DIAGNOSIS — Z85820 Personal history of malignant melanoma of skin: Secondary | ICD-10-CM | POA: Diagnosis not present

## 2023-11-06 DIAGNOSIS — Z7982 Long term (current) use of aspirin: Secondary | ICD-10-CM | POA: Diagnosis not present

## 2023-11-06 DIAGNOSIS — I959 Hypotension, unspecified: Secondary | ICD-10-CM | POA: Diagnosis not present

## 2023-11-06 DIAGNOSIS — I2510 Atherosclerotic heart disease of native coronary artery without angina pectoris: Secondary | ICD-10-CM | POA: Diagnosis not present

## 2023-11-06 DIAGNOSIS — E860 Dehydration: Secondary | ICD-10-CM | POA: Diagnosis not present

## 2023-11-06 DIAGNOSIS — R739 Hyperglycemia, unspecified: Secondary | ICD-10-CM | POA: Diagnosis not present

## 2023-11-06 LAB — URINALYSIS, ROUTINE W REFLEX MICROSCOPIC
Bilirubin Urine: NEGATIVE
Glucose, UA: 50 mg/dL — AB
Hgb urine dipstick: NEGATIVE
Ketones, ur: NEGATIVE mg/dL
Leukocytes,Ua: NEGATIVE
Nitrite: NEGATIVE
Protein, ur: 30 mg/dL — AB
Specific Gravity, Urine: 1.024 (ref 1.005–1.030)
pH: 5 (ref 5.0–8.0)

## 2023-11-06 LAB — CBC WITH DIFFERENTIAL/PLATELET
Abs Immature Granulocytes: 0.03 10*3/uL (ref 0.00–0.07)
Basophils Absolute: 0 10*3/uL (ref 0.0–0.1)
Basophils Relative: 1 %
Eosinophils Absolute: 0.1 10*3/uL (ref 0.0–0.5)
Eosinophils Relative: 1 %
HCT: 46.8 % (ref 39.0–52.0)
Hemoglobin: 15.9 g/dL (ref 13.0–17.0)
Immature Granulocytes: 0 %
Lymphocytes Relative: 23 %
Lymphs Abs: 2 10*3/uL (ref 0.7–4.0)
MCH: 29.3 pg (ref 26.0–34.0)
MCHC: 34 g/dL (ref 30.0–36.0)
MCV: 86.3 fL (ref 80.0–100.0)
Monocytes Absolute: 0.7 10*3/uL (ref 0.1–1.0)
Monocytes Relative: 8 %
Neutro Abs: 5.8 10*3/uL (ref 1.7–7.7)
Neutrophils Relative %: 67 %
Platelets: 334 10*3/uL (ref 150–400)
RBC: 5.42 MIL/uL (ref 4.22–5.81)
RDW: 13.8 % (ref 11.5–15.5)
WBC: 8.6 10*3/uL (ref 4.0–10.5)
nRBC: 0 % (ref 0.0–0.2)

## 2023-11-06 LAB — TROPONIN I (HIGH SENSITIVITY)
Troponin I (High Sensitivity): 8 ng/L (ref <18)
Troponin I (High Sensitivity): 9 ng/L (ref <18)

## 2023-11-06 LAB — COMPREHENSIVE METABOLIC PANEL
ALT: 48 U/L — ABNORMAL HIGH (ref 0–44)
AST: 42 U/L — ABNORMAL HIGH (ref 15–41)
Albumin: 3.6 g/dL (ref 3.5–5.0)
Alkaline Phosphatase: 78 U/L (ref 38–126)
Anion gap: 11 (ref 5–15)
BUN: 27 mg/dL — ABNORMAL HIGH (ref 8–23)
CO2: 23 mmol/L (ref 22–32)
Calcium: 9.8 mg/dL (ref 8.9–10.3)
Chloride: 100 mmol/L (ref 98–111)
Creatinine, Ser: 1.46 mg/dL — ABNORMAL HIGH (ref 0.61–1.24)
GFR, Estimated: 49 mL/min — ABNORMAL LOW (ref >60)
Glucose, Bld: 227 mg/dL — ABNORMAL HIGH (ref 70–99)
Potassium: 4.8 mmol/L (ref 3.5–5.1)
Sodium: 134 mmol/L — ABNORMAL LOW (ref 135–145)
Total Bilirubin: 1.4 mg/dL — ABNORMAL HIGH (ref 0.0–1.2)
Total Protein: 6.8 g/dL (ref 6.5–8.1)

## 2023-11-06 MED ORDER — SODIUM CHLORIDE 0.9 % IV BOLUS
500.0000 mL | Freq: Once | INTRAVENOUS | Status: AC
  Administered 2023-11-07: 500 mL via INTRAVENOUS

## 2023-11-06 NOTE — ED Provider Triage Note (Cosign Needed)
Emergency Medicine Provider Triage Evaluation Note  AMOGH BEAUMAN , a 77 y.o. male  was evaluated in triage.  Pt complains of low BP at Dr's office and high glucose today. Reports generalized weaknessx3 weeks. States all this happened after taking prednisone. Has been taking ozempic also, c/o 12 lb weight loss in 2 weeks. No abd pain, chest pain, or SOB.  Review of Systems  Positive: weakness Negative: SOB  Physical Exam  BP 104/72 (BP Location: Right Arm)   Pulse (!) 109   Temp (!) 97.5 F (36.4 C)   Resp 16   Ht 5\' 8"  (1.727 m)   Wt 121.6 kg   SpO2 98%   BMI 40.76 kg/m  Gen:   Awake, no distress   Resp:  Normal effort  MSK:   Moves extremities without difficulty   Medical Decision Making  Medically screening exam initiated at 3:18 PM.  Appropriate orders placed.  Macarthur Critchley was informed that the remainder of the evaluation will be completed by another provider, this initial triage assessment does not replace that evaluation, and the importance of remaining in the ED until their evaluation is complete.     Pete Pelt, [place] 11/06/23 1520

## 2023-11-06 NOTE — ED Triage Notes (Signed)
Pt was at PCP and bp was 80/64 and pt is hyperglycemic 242 fasting. Pt c/o generalized weaknessx3wks

## 2023-11-06 NOTE — ED Notes (Signed)
Pt was ambulatory to room with no assistance

## 2023-11-07 NOTE — ED Provider Notes (Signed)
 [place] EMERGENCY DEPARTMENT AT [facility] Provider Note   CSN: [id number] Arrival date & time: 11/06/23  1456     History  No chief complaint on file.   Justin Cohen is a 77 y.o. male.  HPI     This is a 77 year old male who presents with concerns for low blood pressure and hyperglycemia.  Patient reports that he saw his PCP today in follow-up.  This was after he states that he had been on prednisone for over a week and had had generalized fatigue.  He states that he did not really feel bad but that he just did not have any energy to get up and go.  During this time he states that he had some aversion to foods because of metallic taste in his mouth and also lost his voice.  Denies any other upper respiratory symptoms, fever, cough, shortness of breath.  At his PCP office he was noted to have a low blood pressure and his blood sugar was greater than 200.  For this reason he was sent here for further evaluation.  Patient does take blood pressure medication.  Reports compliance with blood pressure medications.  States normally his blood pressure is 110-115 systolic.  Home Medications Prior to Admission medications   Medication Sig Start Date End Date Taking? Authorizing Provider  aspirin  EC 81 MG tablet Take 81 mg by mouth daily. Swallow whole.    [provider]  B Complex CAPS See admin instructions.    [provider]  cetirizine (ZYRTEC) 10 MG tablet Take 10 mg by mouth daily.    [provider]  Cholecalciferol  (VITAMIN D ) 2000 UNITS tablet Take 2,000 Units by mouth daily.    [provider]  Cyanocobalamin (VITAMIN B12) 1000 MCG TBCR 1 tablet    [provider]  fluticasone  (FLONASE ) 50 MCG/ACT nasal spray Place 2 sprays into both nostrils daily.    [provider]  latanoprost  (XALATAN ) 0.005 % ophthalmic solution One drop in each eye nightly. 11/03/21   [provider]  losartan  (COZAAR ) 100 MG  tablet TAKE 1 TABLET EVERY DAY 10/13/23   Jeffrie Oneil BROCKS, MD  metFORMIN  (GLUCOPHAGE ) 1000 MG tablet Take 1,000 mg by mouth 2 (two) times daily with a meal.    [provider]  Multiple Vitamin (MULTIVITAMIN WITH MINERALS) TABS tablet Take 1 tablet by mouth daily.     [provider]  nitroGLYCERIN  (NITROSTAT ) 0.4 MG SL tablet Place 1 tablet (0.4 mg total) under the tongue every 5 (five) minutes as needed for chest pain. 11/12/21   Jeffrie Oneil BROCKS, MD  Omega-3 Fatty Acids (FISH OIL) 1000 MG CAPS Take 1,000 mg by mouth daily.     [provider]  pioglitazone  (ACTOS ) 45 MG tablet Take 45 mg by mouth daily.  01/27/15   [provider]  Probiotic Product (PROBIOTIC DAILY PO) Take 1 capsule by mouth daily.    [provider]  rosuvastatin  (CRESTOR ) 20 MG tablet TAKE 1 TABLET EVERY DAY 03/06/23   Jeffrie Oneil BROCKS, MD  venlafaxine  XR (EFFEXOR -XR) 75 MG 24 hr capsule Take 75 mg by mouth daily with breakfast.  12/16/14   [provider]      Allergies    Beta adrenergic blockers    Review of Systems   Review of Systems  Constitutional:  Positive for fatigue. Negative for fever.  HENT:  Positive for voice change.   Respiratory: Negative.  Negative for chest tightness and  shortness of breath.   Cardiovascular: Negative.  Negative for chest pain.  Gastrointestinal: Negative.  Negative for abdominal pain.  Genitourinary: Negative.  Negative for dysuria.  Musculoskeletal:  Negative for back pain.  Skin:  Negative for rash.  Neurological:  Negative for headaches.  All other systems reviewed and are negative.   Physical Exam Updated Vital Signs BP (!) 108/55   Pulse 83   Temp 97.8 F (36.6 C) (Oral)   Resp 15   Ht 1.727 m (5' 8)   Wt 121.6 kg   SpO2 94%   BMI 40.76 kg/m  Physical Exam Vitals and nursing note reviewed.  Constitutional:      Appearance: He is well-developed. He is obese. He is not ill-appearing.  HENT:     Head: Normocephalic  and atraumatic.     Mouth/Throat:     Mouth: Mucous membranes are moist.     Pharynx: No oropharyngeal exudate or posterior oropharyngeal erythema.  Eyes:     Pupils: Pupils are equal, round, and reactive to light.  Cardiovascular:     Rate and Rhythm: Normal rate and regular rhythm.     Heart sounds: Normal heart sounds. No murmur heard. Pulmonary:     Effort: Pulmonary effort is normal. No respiratory distress.     Breath sounds: Normal breath sounds. No wheezing.  Abdominal:     Palpations: Abdomen is soft.     Tenderness: There is no abdominal tenderness.  Musculoskeletal:     Cervical back: Neck supple.  Lymphadenopathy:     Cervical: No cervical adenopathy.  Skin:    General: Skin is warm and dry.  Neurological:     Mental Status: He is alert and oriented to person, place, and time.  Psychiatric:        Mood and Affect: Mood normal.     ED Results / Procedures / Treatments   Labs (all labs ordered are listed, but only abnormal results are displayed) Labs Reviewed  COMPREHENSIVE METABOLIC PANEL - Abnormal; Notable for the following components:      Result Value   Sodium 134 (*)    Glucose, Bld 227 (*)    BUN 27 (*)    Creatinine, Ser 1.46 (*)    AST 42 (*)    ALT 48 (*)    Total Bilirubin 1.4 (*)    GFR, Estimated 49 (*)    All other components within normal limits  URINALYSIS, ROUTINE W REFLEX MICROSCOPIC - Abnormal; Notable for the following components:   Color, Urine AMBER (*)    APPearance CLOUDY (*)    Glucose, UA 50 (*)    Protein, ur 30 (*)    Bacteria, UA RARE (*)    All other components within normal limits  CBC WITH DIFFERENTIAL/PLATELET  CBG MONITORING, ED  TROPONIN I (HIGH SENSITIVITY)  TROPONIN I (HIGH SENSITIVITY)    EKG EKG Interpretation Date/Time:  Monday November 06 2023 15:05:15 EST Ventricular Rate:  110 PR Interval:  176 QRS Duration:  86 QT Interval:  350 QTC Calculation: 473 R Axis:   76  Text Interpretation: Sinus  tachycardia Otherwise normal ECG When compared with ECG of 09-Feb-2021 04:35, PREVIOUS ECG IS PRESENT Confirmed by Bari Pfeiffer ([id number]) on 11/07/2023 12:06:07 AM  Radiology DG Chest 2 View Result Date: 11/06/2023 CLINICAL DATA:  Generalized weakness. EXAM: CHEST - 2 VIEW COMPARISON:  02/07/2021. FINDINGS: Redemonstration of linear area of atelectasis/scarring overlying the left lower lung zone, laterally. There is calcific opacity overlying the right lateral  costophrenic angle region, which is unchanged since the prior study dating back to September 11, 2017. Bilateral lung fields are otherwise clear. No acute consolidation or lung collapse. Bilateral costophrenic angles are clear. Normal cardio-mediastinal silhouette. No acute osseous abnormalities. The soft tissues are within normal limits. IMPRESSION: No active cardiopulmonary disease. Electronically Signed   By: Ree Molt M.D.   On: 11/06/2023 16:57    Procedures Procedures    Medications Ordered in ED Medications  sodium chloride  0.9 % bolus 500 mL (0 mLs Intravenous Stopped 11/07/23 0103)    ED Course/ Medical Decision Making/ A&P                                 Medical Decision Making  This patient presents to the ED for concern of high blood sugar, low blood pressure, this involves an extensive number of treatment options, and is a complaint that carries with it a high risk of complications and morbidity.  I considered the following differential and admission for this acute, potentially life threatening condition.  The differential diagnosis includes hyperglycemic crisis, DKA, sepsis, dehydration, adverse medication reaction  MDM:    This is a 77 year old male who presents with concerns for high blood sugar and low blood pressure.  He is overall nontoxic-appearing.  Vital signs here have been reassuring with blood pressures 100s/50-70s.  He has stopped his prednisone although I am not sure that this is the culprit.  He  denies any other upper respiratory symptoms but it sounds like he may have had some laryngitis.  Also had some metallic taste in his mouth which has decreased his oral intake.  Today's lab work is notable for AKI with a creatinine of 1.46.  Baseline is around 1.  Patient was given IV fluids.  He is able to orally hydrate.  Otherwise his lab work is mostly at his baseline.  No evidence of DKA or hyperglycemic crisis.  He has stopped his prednisone voluntarily.  This could be his culprit for generalized fatigue.  Blood pressures here have been reassuring.  Recommend follow-up closely with primary doctor for blood pressure recheck and repeat kidney function testing.  (Labs, imaging, consults)  Labs: I Ordered, and personally interpreted labs.  The pertinent results include: CBC, CMP, opponent x 2, urinalysis  Imaging Studies ordered: I ordered imaging studies including chest x-ray I independently visualized and interpreted imaging. I agree with the radiologist interpretation  Additional history obtained from chart review.  External records from outside source obtained and reviewed including prior evaluations  Cardiac Monitoring: The patient was maintained on a cardiac monitor.  If on the cardiac monitor, I personally viewed and interpreted the cardiac monitored which showed an underlying rhythm of: Sinus  Reevaluation: After the interventions noted above, I reevaluated the patient and found that they have :improved  Social Determinants of Health:  lives independently  Disposition:  discharge  Co morbidities that complicate the patient evaluation  Past Medical History:  Diagnosis Date   Arthritis    Basal cell carcinoma of nose    CAD (coronary artery disease)    Admx with [place]  4/22 >> S/p DES to pLAD 4/22   Complication of anesthesia    Diabetes Mellitus Type 2    TYPE 2    Environmental allergies    Gastroesophageal reflux disease    Glaucoma    Hyperlipidemia    Hypertension     Ischemic cardiomyopathy  Echocardiogram 4/22: EF 45-50, ant-sept and apical AK, inf-sept and inf HK   Melanoma (HCC)    Prostate cancer (HCC)    Sinus problem    Sleep apnea    cpap- setting at 12      Medicines Meds ordered this encounter  Medications   sodium chloride  0.9 % bolus 500 mL    I have reviewed the patients home medicines and have made adjustments as needed  Problem List / ED Course: Problem List Items Addressed This Visit   None Visit Diagnoses       Other fatigue    -  Primary     AKI (acute kidney injury) (HCC)                       Final Clinical Impression(s) / ED Diagnoses Final diagnoses:  Other fatigue  AKI (acute kidney injury) (HCC)    Rx / DC Orders ED Discharge Orders     None         Bari Charmaine FALCON, MD 11/07/23 [phone]

## 2023-11-07 NOTE — Discharge Instructions (Addendum)
 You were seen today with concerns for low blood pressure and high blood sugars.  You do have some evidence of kidney injury which may be related to dehydration.  Make sure that you are staying hydrated with plenty of electrolyte rich fluids.  Have your labs rechecked in 1 week.  If not improving and continuing to have fatigue after discontinuing prednisone, follow-up closely with your primary doctor.

## 2023-11-09 ENCOUNTER — Ambulatory Visit (INDEPENDENT_AMBULATORY_CARE_PROVIDER_SITE_OTHER): Payer: Self-pay | Admitting: Podiatry

## 2023-11-09 ENCOUNTER — Encounter: Payer: Self-pay | Admitting: Podiatry

## 2023-11-09 DIAGNOSIS — Z0189 Encounter for other specified special examinations: Secondary | ICD-10-CM

## 2023-11-09 DIAGNOSIS — M2142 Flat foot [pes planus] (acquired), left foot: Secondary | ICD-10-CM

## 2023-11-09 DIAGNOSIS — M2141 Flat foot [pes planus] (acquired), right foot: Secondary | ICD-10-CM | POA: Diagnosis not present

## 2023-11-09 DIAGNOSIS — E119 Type 2 diabetes mellitus without complications: Secondary | ICD-10-CM

## 2023-11-09 NOTE — Progress Notes (Signed)
 Subjective: Chief Complaint  Patient presents with   [facility]    RM#13 Diabetic foot care Inquire for orthotics  Patient has no other concerns today.   78 year old male presents the office with above concerns.  He is interested in new inserts for his shoes, diabetes.  As his nails are thickened elongated cannot himself may cause discomfort.  No open lesions.   Objective: AAO x3, NAD DP/PT pulses palpable bilaterally, CRT less than 3 seconds Sensation intact with Semmes Weinstein monofilament. Nails are mildly hypertrophic, dystrophic, brittle, discolored, elongated 10. No surrounding redness or drainage. Tenderness nails 1-5 bilaterally. No open lesions or pre-ulcerative lesions are identified today. Flatfoot is present. No pain with calf compression, swelling, warmth, erythema  Assessment: Diabetic foot exam  Plan: -All treatment options discussed with the patient including all alternatives, risks, complications.  -Debrided nails x 10 without any complications or bleeding.  Discussed daily foot inspection.  Based on previous notes he is previously diagnosed with neuropathy.  I do think open from diabetic shoes and he was seen today by Lolita, pedorthotist for measurement.  -Patient encouraged to call the office with any questions, concerns, change in symptoms.   ---    Patient presents to the office today for diabetic shoe and insole measuring.  Patient was measured with brannock device to determine size and width using scans from last year on file with [facility] to build new inserts 3pr custom   Documentation of medical necessity will be sent to patient's treating diabetic doctor to verify and sign.   Patient's diabetic provider: Alm Rav MD   Shoes and insoles will be ordered at that time and patient will be notified for an appointment for fitting when they arrive.   Shoe size (per patient): 10.5 XWD Patient shoe selection- Shoe choice:   [id number] / [id number] Shoe size ordered:  10.5 XWD

## 2023-11-09 NOTE — Patient Instructions (Signed)

## 2023-11-21 ENCOUNTER — Encounter: Payer: Self-pay | Admitting: Cardiology

## 2023-11-21 ENCOUNTER — Ambulatory Visit: Payer: PPO | Attending: Cardiology | Admitting: Cardiology

## 2023-11-21 VITALS — BP 130/70 | HR 95 | Ht 67.0 in | Wt 238.4 lb

## 2023-11-21 DIAGNOSIS — I2510 Atherosclerotic heart disease of native coronary artery without angina pectoris: Secondary | ICD-10-CM

## 2023-11-21 DIAGNOSIS — E785 Hyperlipidemia, unspecified: Secondary | ICD-10-CM | POA: Diagnosis not present

## 2023-11-21 DIAGNOSIS — I2583 Coronary atherosclerosis due to lipid rich plaque: Secondary | ICD-10-CM

## 2023-11-21 DIAGNOSIS — E119 Type 2 diabetes mellitus without complications: Secondary | ICD-10-CM | POA: Diagnosis not present

## 2023-11-21 DIAGNOSIS — I10 Essential (primary) hypertension: Secondary | ICD-10-CM

## 2023-11-21 MED ORDER — LOSARTAN POTASSIUM 50 MG PO TABS: 50.0000 mg | ORAL_TABLET | Freq: Every day | ORAL | 3 refills | Status: DC

## 2023-11-21 NOTE — Progress Notes (Signed)
 Cardiology Office Note:  .   Date:  11/21/2023  ID:  Justin Cohen, DOB Nov 05, 1946, MRN [id number] PCP: Seabron Lenis, MD  [facility] Providers Cardiologist:  Oneil Parchment, MD     History of Present Illness: .   Justin Cohen is a 78 y.o. male Discussed with the use of AI scribe   History of Present Illness   A 78 year old patient with a history of coronary artery disease, ischemic cardiomyopathy (resolved), diabetes, hypertension, hyperlipidemia, and prostate cancer presented for follow-up. The patient had a cardiac catheterization with drug-eluting stent placement in the LAD proximal in April 2022. He has been on aspirin  monotherapy after experiencing significant bruising with Brilinta . The patient also reported GERD-like symptoms post-catheterization. A nuclear stress test performed in January 2023 was normal, showing an ejection fraction of 67%. The patient's obstructive sleep apnea is managed with CPAP, and he follows a Mediterranean low salt diet.  The patient reported a recent emergency room visit due to kidney injury and fatigue. He was found to be dehydrated with a blood pressure of 80/60. His losartan  dosage was subsequently reduced due to hypotension. The patient also reported a change in his diabetes medication from Actos  to Ozempic, and then to Mounjaro due to stomach discomfort. He noted weight loss since starting the new medication.  The patient also expressed concern about a recent increase in his PSA levels from zero to two, nine years post-prostate cancer treatment and prostate removal. He has a scheduled urology appointment in March to investigate this change. The patient's current medications include aspirin  81mg  daily, losartan  50mg  daily, pioglitazone  45mg  daily, metformin  1000mg  twice daily, and rosuvastatin  20mg  daily.          ROS: No CP, no SOB  Studies Reviewed: .        Results   LABS LDL: 62 A1c: 7.4 PSA: 2  DIAGNOSTIC Cardiac  catheterization: Drug eluting stent to LAD proximal (02/2021) Nuclear stress test: Normal ejection fraction of 67% (11/2021) EKG: Sinus tachycardia, 110 bpm, no ischemic changes (11/06/2023)     Risk Assessment/Calculations:            Physical Exam:   VS:  BP 130/70   Pulse 95   Ht 5' 7 (1.702 m)   Wt 238 lb 6.4 oz (108.1 kg)   SpO2 94%   BMI 37.34 kg/m    Wt Readings from Last 3 Encounters:  11/21/23 238 lb 6.4 oz (108.1 kg)  11/06/23 268 lb 1.3 oz (121.6 kg)  11/14/22 268 lb (121.6 kg)    GEN: Well nourished, well developed in no acute distress NECK: No JVD; No carotid bruits CARDIAC: RRR, no murmurs, no rubs, no gallops RESPIRATORY:  Clear to auscultation without rales, wheezing or rhonchi  ABDOMEN: Soft, non-tender, non-distended EXTREMITIES:  No edema; No deformity   ASSESSMENT AND PLAN: .    Assessment and Plan    Coronary Artery Disease (CAD) Status post drug-eluting stent to the LAD proximal in April 2022. Recent nuclear stress test in January 2023 showed normal ejection fraction of 67%. Mild nonobstructive disease in the mid right coronary artery (50%). Currently on aspirin  monotherapy due to previous significant bruising with Brilinta . No current chest pain or ischemic symptoms. Discussed the importance of continuing aspirin  therapy to prevent further cardiac events. Patient understands the risks of bleeding and the benefits of preventing clot formation. - Continue aspirin  81 mg daily - PRN follow-up with cardiology  Hypertension (with recent episode of hypotension in ER 10/2023)  Managed with losartan . Recent episode of hypotension (80/60 mmHg) and dehydration led to reduction of losartan  dose from 100 mg to 50 mg. Current blood pressure well-controlled at 130/70 mmHg. Patient agrees with the dose reduction to avoid hypotension and related symptoms. - Continue losartan  50 mg daily - Monitor blood pressure regularly -Likely exacerbated by weight loss from GLP-1  antagonist  Diabetes Mellitus Managed with metformin  and pioglitazone . Recently switched from Actos  to Mounjaro due to gastrointestinal side effects. A1c currently at 7.4%. Patient has lost approximately 30 lbs, which may further improve glycemic control. Discussed the potential benefits of weight loss on blood pressure and diabetes control. Patient is aware of the need to monitor for any adverse effects of Mounjaro. - Continue metformin  1000 mg twice daily - Continue Mounjaro 2.5 mg weekly - Monitor A1c and adjust treatment as needed  Hyperlipidemia Managed with rosuvastatin . LDL cholesterol is well-controlled at 62 mg/dL. Patient understands the importance of maintaining LDL levels to reduce the risk of cardiovascular events. - Continue rosuvastatin  20 mg daily  Prostate Cancer hx Status post prostatectomy. PSA levels have recently increased from 0 to 2 over the last three samples. Patient has a follow-up appointment with urology in March. Discussed the need for further evaluation to determine the cause of the PSA increase. - Follow-up with urology in March - Monitor PSA levels  Obstructive Sleep Apnea Managed with CPAP therapy. No current issues reported. Patient adheres to CPAP therapy and understands its importance in managing sleep apnea. - Continue CPAP therapy  General Health Maintenance Adheres to a Mediterranean low salt diet. - Continue Mediterranean low salt diet  Follow-up - PRN follow-up with cardiology - Reach out if new symptoms or concerns arise. Refills per Dr. Seabron team. Doing great.               Signed, Oneil Parchment, MD

## 2023-11-21 NOTE — Patient Instructions (Addendum)
 Medication Instructions:  Your physician has recommended you make the following change in your medication:   1) Take Losartan  50 mg daily  *If you need a refill on your cardiac medications before your next appointment, please call your pharmacy*  Lab Work: None ordered today.  Testing/Procedures: None ordered today.  Follow-Up: At [facility], you and your health needs are our priority.  As part of our continuing mission to provide you with exceptional heart care, we have created designated Provider Care Teams.  These Care Teams include your primary Cardiologist (physician) and Advanced Practice Providers (APPs -  Physician Assistants and Nurse Practitioners) who all work together to provide you with the care you need, when you need it.  Your next appointment:   As needed  The format for your next appointment:   In Person  Provider:   Oneil Parchment, MD {

## 2023-12-04 ENCOUNTER — Other Ambulatory Visit (HOSPITAL_COMMUNITY): Payer: Self-pay

## 2023-12-04 MED ORDER — LOSARTAN POTASSIUM 50 MG PO TABS
50.0000 mg | ORAL_TABLET | Freq: Every day | ORAL | 3 refills | Status: DC
  Filled 2023-12-04: qty 90, 90d supply, fill #0

## 2023-12-08 ENCOUNTER — Telehealth: Payer: Self-pay | Admitting: Cardiology

## 2023-12-08 MED ORDER — LOSARTAN POTASSIUM 50 MG PO TABS: 50.0000 mg | ORAL_TABLET | Freq: Every day | ORAL | 3 refills | Status: AC

## 2023-12-08 MED ORDER — ROSUVASTATIN CALCIUM 20 MG PO TABS: 20.0000 mg | ORAL_TABLET | Freq: Every day | ORAL | 3 refills | Status: AC

## 2023-12-08 NOTE — Telephone Encounter (Signed)
 Pt's medications were sent to pt's pharmacy as requested. Confirmation received.

## 2023-12-08 NOTE — Telephone Encounter (Signed)
*  STAT* If patient is at the pharmacy, call can be transferred to refill team.   1. Which medications need to be refilled? (please list name of each medication and dose if known)  losartan (COZAAR) 50 MG tablet   rosuvastatin (CRESTOR) 20 MG tablet   2. Would you like to learn more about the convenience, safety, & potential cost savings by using the [facility]? N/A     3. Are you open to using the [facility] (Type [facility]. N/A   4. Which pharmacy/location (including street and city if local pharmacy) is medication to be sent to?  [facility]) [facility] [place], [place] - [address] [address]    5. Do they need a 30 day or 90 day supply? 90 day

## 2023-12-26 ENCOUNTER — Other Ambulatory Visit: Payer: PPO

## 2023-12-30 NOTE — Progress Notes (Signed)
 PUDS

## 2024-01-03 ENCOUNTER — Telehealth: Payer: Self-pay

## 2024-01-03 NOTE — Telephone Encounter (Signed)
 Justin Cohen #[id number] VALID THRU 12/19/23-03/18/24

## 2024-01-10 DIAGNOSIS — C61 Malignant neoplasm of prostate: Secondary | ICD-10-CM | POA: Diagnosis not present

## 2024-01-17 DIAGNOSIS — C61 Malignant neoplasm of prostate: Secondary | ICD-10-CM | POA: Diagnosis not present

## 2024-01-25 ENCOUNTER — Ambulatory Visit (INDEPENDENT_AMBULATORY_CARE_PROVIDER_SITE_OTHER): Payer: PPO

## 2024-01-25 DIAGNOSIS — M2142 Flat foot [pes planus] (acquired), left foot: Secondary | ICD-10-CM

## 2024-01-25 DIAGNOSIS — E119 Type 2 diabetes mellitus without complications: Secondary | ICD-10-CM | POA: Diagnosis not present

## 2024-01-25 DIAGNOSIS — M2141 Flat foot [pes planus] (acquired), right foot: Secondary | ICD-10-CM

## 2024-01-29 NOTE — Progress Notes (Signed)

## 2024-02-01 DIAGNOSIS — J069 Acute upper respiratory infection, unspecified: Secondary | ICD-10-CM | POA: Diagnosis not present

## 2024-02-20 DIAGNOSIS — Z8619 Personal history of other infectious and parasitic diseases: Secondary | ICD-10-CM | POA: Diagnosis not present

## 2024-02-20 DIAGNOSIS — I10 Essential (primary) hypertension: Secondary | ICD-10-CM | POA: Diagnosis not present

## 2024-02-20 DIAGNOSIS — E7800 Pure hypercholesterolemia, unspecified: Secondary | ICD-10-CM | POA: Diagnosis not present

## 2024-02-20 DIAGNOSIS — L4050 Arthropathic psoriasis, unspecified: Secondary | ICD-10-CM | POA: Diagnosis not present

## 2024-02-20 DIAGNOSIS — Z6836 Body mass index (BMI) 36.0-36.9, adult: Secondary | ICD-10-CM | POA: Diagnosis not present

## 2024-02-20 DIAGNOSIS — I5022 Chronic systolic (congestive) heart failure: Secondary | ICD-10-CM | POA: Diagnosis not present

## 2024-02-20 DIAGNOSIS — C61 Malignant neoplasm of prostate: Secondary | ICD-10-CM | POA: Diagnosis not present

## 2024-02-20 DIAGNOSIS — E1169 Type 2 diabetes mellitus with other specified complication: Secondary | ICD-10-CM | POA: Diagnosis not present

## 2024-02-20 DIAGNOSIS — F419 Anxiety disorder, unspecified: Secondary | ICD-10-CM | POA: Diagnosis not present

## 2024-02-20 DIAGNOSIS — J302 Other seasonal allergic rhinitis: Secondary | ICD-10-CM | POA: Diagnosis not present

## 2024-02-20 DIAGNOSIS — I25119 Atherosclerotic heart disease of native coronary artery with unspecified angina pectoris: Secondary | ICD-10-CM | POA: Diagnosis not present

## 2024-03-25 ENCOUNTER — Other Ambulatory Visit (HOSPITAL_COMMUNITY): Payer: Self-pay | Admitting: Urology

## 2024-03-25 DIAGNOSIS — R9721 Rising PSA following treatment for malignant neoplasm of prostate: Secondary | ICD-10-CM

## 2024-04-02 ENCOUNTER — Encounter (HOSPITAL_COMMUNITY)
Admission: RE | Admit: 2024-04-02 | Discharge: 2024-04-02 | Disposition: A | Discharge status: Home / Self Care | Source: Ambulatory Visit | Attending: Urology | Admitting: Urology

## 2024-04-02 DIAGNOSIS — R9721 Rising PSA following treatment for malignant neoplasm of prostate: Secondary | ICD-10-CM | POA: Diagnosis not present

## 2024-04-02 DIAGNOSIS — R918 Other nonspecific abnormal finding of lung field: Secondary | ICD-10-CM | POA: Diagnosis not present

## 2024-04-02 DIAGNOSIS — C61 Malignant neoplasm of prostate: Secondary | ICD-10-CM | POA: Diagnosis not present

## 2024-04-02 MED ORDER — FLOTUFOLASTAT F 18 GALLIUM 296-5846 MBQ/ML IV SOLN
8.7000 | Freq: Once | INTRAVENOUS | Status: AC
  Administered 2024-04-02: 8.7 via INTRAVENOUS

## 2024-04-10 DIAGNOSIS — C61 Malignant neoplasm of prostate: Secondary | ICD-10-CM | POA: Diagnosis not present

## 2024-04-17 DIAGNOSIS — C61 Malignant neoplasm of prostate: Secondary | ICD-10-CM | POA: Diagnosis not present

## 2024-04-23 ENCOUNTER — Telehealth: Payer: Self-pay | Admitting: Podiatry

## 2024-04-23 NOTE — Telephone Encounter (Signed)
 Patient's wife reports they received a bill for the date of service 11/09/2023 for Dr. Clydia Dart. The bill should have been filed against [employer], but they say no claim has been received.  Could you please investigate this and call the patient's wife on her cell phone with an update?  Thank you.

## 2024-06-19 DIAGNOSIS — L578 Other skin changes due to chronic exposure to nonionizing radiation: Secondary | ICD-10-CM | POA: Diagnosis not present

## 2024-06-19 DIAGNOSIS — L821 Other seborrheic keratosis: Secondary | ICD-10-CM | POA: Diagnosis not present

## 2024-06-19 DIAGNOSIS — L570 Actinic keratosis: Secondary | ICD-10-CM | POA: Diagnosis not present

## 2024-06-19 DIAGNOSIS — Z08 Encounter for follow-up examination after completed treatment for malignant neoplasm: Secondary | ICD-10-CM | POA: Diagnosis not present

## 2024-06-19 DIAGNOSIS — D1801 Hemangioma of skin and subcutaneous tissue: Secondary | ICD-10-CM | POA: Diagnosis not present

## 2024-06-19 DIAGNOSIS — Z85828 Personal history of other malignant neoplasm of skin: Secondary | ICD-10-CM | POA: Diagnosis not present

## 2024-07-09 DIAGNOSIS — E119 Type 2 diabetes mellitus without complications: Secondary | ICD-10-CM | POA: Diagnosis not present

## 2024-07-09 DIAGNOSIS — H401131 Primary open-angle glaucoma, bilateral, mild stage: Secondary | ICD-10-CM | POA: Diagnosis not present

## 2024-07-09 DIAGNOSIS — Z7984 Long term (current) use of oral hypoglycemic drugs: Secondary | ICD-10-CM | POA: Diagnosis not present

## 2024-07-09 DIAGNOSIS — H26492 Other secondary cataract, left eye: Secondary | ICD-10-CM | POA: Diagnosis not present

## 2024-07-09 DIAGNOSIS — H52203 Unspecified astigmatism, bilateral: Secondary | ICD-10-CM | POA: Diagnosis not present

## 2024-07-09 DIAGNOSIS — H43812 Vitreous degeneration, left eye: Secondary | ICD-10-CM | POA: Diagnosis not present

## 2024-07-09 DIAGNOSIS — H26491 Other secondary cataract, right eye: Secondary | ICD-10-CM | POA: Diagnosis not present

## 2024-07-09 DIAGNOSIS — H524 Presbyopia: Secondary | ICD-10-CM | POA: Diagnosis not present

## 2024-08-08 DIAGNOSIS — E7800 Pure hypercholesterolemia, unspecified: Secondary | ICD-10-CM | POA: Diagnosis not present

## 2024-08-08 DIAGNOSIS — C799 Secondary malignant neoplasm of unspecified site: Secondary | ICD-10-CM | POA: Diagnosis not present

## 2024-08-08 DIAGNOSIS — I25119 Atherosclerotic heart disease of native coronary artery with unspecified angina pectoris: Secondary | ICD-10-CM | POA: Diagnosis not present

## 2024-08-08 DIAGNOSIS — Z23 Encounter for immunization: Secondary | ICD-10-CM | POA: Diagnosis not present

## 2024-08-08 DIAGNOSIS — E1169 Type 2 diabetes mellitus with other specified complication: Secondary | ICD-10-CM | POA: Diagnosis not present

## 2024-08-08 DIAGNOSIS — L4050 Arthropathic psoriasis, unspecified: Secondary | ICD-10-CM | POA: Diagnosis not present

## 2024-08-08 DIAGNOSIS — Z9079 Acquired absence of other genital organ(s): Secondary | ICD-10-CM | POA: Diagnosis not present

## 2024-08-08 DIAGNOSIS — I10 Essential (primary) hypertension: Secondary | ICD-10-CM | POA: Diagnosis not present

## 2024-08-08 DIAGNOSIS — C61 Malignant neoplasm of prostate: Secondary | ICD-10-CM | POA: Diagnosis not present

## 2024-08-08 DIAGNOSIS — Z0000 Encounter for general adult medical examination without abnormal findings: Secondary | ICD-10-CM | POA: Diagnosis not present

## 2024-08-08 DIAGNOSIS — R9721 Rising PSA following treatment for malignant neoplasm of prostate: Secondary | ICD-10-CM | POA: Diagnosis not present

## 2024-08-08 DIAGNOSIS — Z1331 Encounter for screening for depression: Secondary | ICD-10-CM | POA: Diagnosis not present

## 2024-08-20 DIAGNOSIS — C61 Malignant neoplasm of prostate: Secondary | ICD-10-CM | POA: Diagnosis not present
